# Patient Record
Sex: Male | Born: 1950
Health system: Southern US, Community
[De-identification: ages and names within clinical notes are randomized; demographics above are authoritative.]

## PROBLEM LIST (undated history)

## (undated) DIAGNOSIS — I219 Acute myocardial infarction, unspecified: Secondary | ICD-10-CM

## (undated) DIAGNOSIS — D124 Benign neoplasm of descending colon: Secondary | ICD-10-CM

## (undated) DIAGNOSIS — K635 Polyp of colon: Secondary | ICD-10-CM

## (undated) DIAGNOSIS — I209 Angina pectoris, unspecified: Secondary | ICD-10-CM

## (undated) DIAGNOSIS — E785 Hyperlipidemia, unspecified: Secondary | ICD-10-CM

## (undated) DIAGNOSIS — I10 Essential (primary) hypertension: Secondary | ICD-10-CM

## (undated) DIAGNOSIS — D509 Iron deficiency anemia, unspecified: Secondary | ICD-10-CM

## (undated) DIAGNOSIS — I2510 Atherosclerotic heart disease of native coronary artery without angina pectoris: Secondary | ICD-10-CM

## (undated) DIAGNOSIS — E119 Type 2 diabetes mellitus without complications: Secondary | ICD-10-CM

## (undated) DIAGNOSIS — K922 Gastrointestinal hemorrhage, unspecified: Secondary | ICD-10-CM

## (undated) DIAGNOSIS — N400 Enlarged prostate without lower urinary tract symptoms: Secondary | ICD-10-CM

## (undated) HISTORY — PX: CARDIAC CATHETERIZATION: SHX172

## (undated) HISTORY — DX: Hyperlipidemia, unspecified: E78.5

## (undated) HISTORY — DX: Acute myocardial infarction, unspecified: I21.9

## (undated) HISTORY — PX: HERNIA REPAIR: SHX51

## (undated) HISTORY — DX: Type 2 diabetes mellitus without complications: E11.9

## (undated) HISTORY — DX: Benign prostatic hyperplasia without lower urinary tract symptoms: N40.0

## (undated) HISTORY — DX: Benign neoplasm of descending colon: D12.4

## (undated) HISTORY — DX: Polyp of colon: K63.5

## (undated) HISTORY — PX: OTHER SURGICAL HISTORY: SHX169

---
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
## 2007-04-23 ENCOUNTER — Inpatient Hospital Stay (HOSPITAL_COMMUNITY): Admission: AD | Admit: 2007-04-23 | Discharge: 2007-04-25 | Payer: Self-pay | Admitting: Cardiology

## 2007-05-06 ENCOUNTER — Encounter: Admission: RE | Admit: 2007-05-06 | Discharge: 2007-05-06 | Payer: Self-pay | Admitting: Cardiology

## 2008-02-04 ENCOUNTER — Inpatient Hospital Stay (HOSPITAL_COMMUNITY): Admission: AD | Admit: 2008-02-04 | Discharge: 2008-02-05 | Payer: Self-pay | Admitting: Cardiology

## 2008-03-04 ENCOUNTER — Ambulatory Visit (HOSPITAL_COMMUNITY): Admission: RE | Admit: 2008-03-04 | Discharge: 2008-03-04 | Payer: Self-pay | Admitting: Cardiology

## 2008-04-28 ENCOUNTER — Inpatient Hospital Stay (HOSPITAL_COMMUNITY): Admission: EM | Admit: 2008-04-28 | Discharge: 2008-05-02 | Payer: Self-pay | Admitting: Emergency Medicine

## 2008-04-28 ENCOUNTER — Ambulatory Visit: Payer: Self-pay | Admitting: Hospitalist

## 2008-07-23 DIAGNOSIS — I10 Essential (primary) hypertension: Secondary | ICD-10-CM | POA: Insufficient documentation

## 2008-07-23 DIAGNOSIS — E785 Hyperlipidemia, unspecified: Secondary | ICD-10-CM | POA: Insufficient documentation

## 2008-07-23 DIAGNOSIS — K922 Gastrointestinal hemorrhage, unspecified: Secondary | ICD-10-CM

## 2008-07-23 HISTORY — DX: Gastrointestinal hemorrhage, unspecified: K92.2

## 2008-12-04 ENCOUNTER — Ambulatory Visit: Payer: Self-pay | Admitting: Family Medicine

## 2009-01-07 DIAGNOSIS — E1129 Type 2 diabetes mellitus with other diabetic kidney complication: Secondary | ICD-10-CM | POA: Insufficient documentation

## 2009-02-22 DIAGNOSIS — M5430 Sciatica, unspecified side: Secondary | ICD-10-CM | POA: Insufficient documentation

## 2009-02-24 ENCOUNTER — Emergency Department (HOSPITAL_COMMUNITY): Admission: EM | Admit: 2009-02-24 | Discharge: 2009-02-24 | Payer: Self-pay | Admitting: Emergency Medicine

## 2011-04-06 ENCOUNTER — Ambulatory Visit: Payer: Self-pay | Admitting: Family Medicine

## 2011-04-11 NOTE — Consult Note (Signed)
NAME:  CANNEN, DUPRAS NO.:  [id number]   MEDICAL RECORD NO.:  [id number]           PATIENT TYPE:   LOCATION:                                 FACILITY:   PHYSICIAN:  Aaronjames L. Randa Evens, M.D. DATE OF BIRTH:  12/03/1950   DATE OF CONSULTATION:  DATE OF DISCHARGE:                                 CONSULTATION   We were asked to see [name] today in consultation for hematemesis  by Dr. Dellia Beckwith of the Medical Teaching Service.   HISTORY OF PRESENT ILLNESS:  This is a 60 year old African American male  who had drug-eluting stent placement approximately 3 months ago.  He is  currently on 325 mg of aspirin and Plavix and he tells me that he has  been taking 2 Aleve tablets daily for the last 2 weeks due to his left  lower extremity pain.  He started complaining of shortness of breath for  the past 3 days.  He felt this is just due to his diet.  He awoke this  morning, had a dark stool and experienced a hot flash, diaphoresis and,  a presyncopal episode.  He came to the ER and had an episode of  hematemesis.  The emergency room doctor estimated the blood loss in the  hematemesis to be approximately 8 ounces.  The patient has never had  hematemesis or dark stools before.  He had a colonoscopy 5 years ago in  [place].  He was told to come back for repeat colonoscopy in 10  years.   PAST MEDICAL HISTORY:  Significant for coronary artery disease.  He is  status post MI and drug-eluting stent placement, type 2 diabetes,  dyslipidemia, GERD, and hypertension with left ventricular hypertrophy.  He has had 1 surgery to remove a fungal cyst that was near his left  clavicle.  He does not have a primary care physician.  His cardiologist  is Dr. Jacinto Halim.   ALLERGIES:  Drug allergies include PENICILLIN.   CURRENT MEDICATIONS:  Include 325 mg aspirin, Plavix, Glucotrol,  metoprolol, simvastatin, and one other that he did not remember the name  of, as well as 2 daily  Aleve.  He is not on PPI.   REVIEW OF SYSTEMS:  Significant for fever and diaphoresis this morning.  He reports he has lost approximately 8 pounds and has chronic pain in  his left lower extremity.   SOCIAL HISTORY:  Negative for tobacco and alcohol.   FAMILY HISTORY:  Negative for colon cancer, ulcer disease, and  gallbladder disease.   PHYSICAL EXAMINATION:  GENERAL:  He is alert and oriented, pleasant to  speak to.  VITAL SIGNS:  His temperature is 97.6, pulse 87, respirations 18, and  blood pressure is 105/72.  Heart:  His heart has a regular rate and rhythm with no murmurs, rubs,  or gallops.  LUNGS:  His lungs are clear to auscultation bilaterally.  ABDOMEN:  His abdomen is soft, nontender, and nondistended with good  bowel sounds.  EXTREMITIES:  He has no lower extremity edema.   LABORATORY DATA:  BMET is significant for  a BUN of 37, creatinine 0.9,  and glucose 208.  CBC shows a hemoglobin of 11.6 with an MCV value of  85, his white count is 15.3, platelets are 274,000, and hematocrit is  34.  PT is 13.9 and INR is 1.1.  Lipase is 28.  AST 28, ALT 18, alkaline  phosphatase 45, and total bilirubin 1.3.  Baseline hemoglobin from  looking at old e-chart records appears to be approximately 13.8.   ASSESSMENT:  Dr. Carman Ching has seen and examined the patient,  collected a history, and reviewed his chart.  His impression is that  this is a 60 year old male with likely an upper gastrointestinal bleed  secondary to nonsteroidal antiinflammatory drugs on top of his  antiplatelet medications.  We will perform upper endoscopy now.  He is  being admitted to the hospital by the Medical Teaching Service and  receiving IV rehydration and blood transfusions as needed.  Agree with  Protonix b.i.d.  We will follow with you.  Thanks very much for this  consultation.      Stephani Police, PA    ______________________________  Llana Aliment Randa Evens, M.D.    MLY/MEDQ  D:  04/28/2008   T:  04/29/2008  Job:  [id number]   cc:   Cristy Hilts. Jacinto Halim, MD

## 2011-04-11 NOTE — Discharge Summary (Signed)
NAME:  Craig York, GUESS NO.:  [id number]   MEDICAL RECORD NO.:  [id number]          PATIENT TYPE:  INP   LOCATION:  4735                         FACILITY:  [facility]   PHYSICIAN:  Eliseo Gum, M.D.   DATE OF BIRTH:  03-02-1951   DATE OF ADMISSION:  04/28/2008  DATE OF DISCHARGE:  05/02/2008                               DISCHARGE SUMMARY   DISCHARGE DIAGNOSES:  1. Upper gastrointestinal bleed from gastric ulcer, most likely due to      nonsteroidal antiinflammatory drugs.  2. Unstable angina, status post drug-eluting stent in left anterior      descending in 3/09  3. Diabetes.  4. Hypertension.  5. Hyperlipidemia.  6. Left ventricular hypertrophy.   DISCHARGE MEDICATIONS:  1. Protonix 40 mg by mouth once daily.  2. Plavix 75 mg by mouth once daily.  3. Zocor 20 mg by mouth once daily.  4. Metoprolol 12.5 mg by mouth twice daily.  5. Aspirin 81 mg by mouth to be started on May 07, 2008.  6. Metformin 500 mg by mouth twice daily.  7. Diovan 40 mg by mouth once daily.   DISPOSITION AND FOLLOWUP:  The patient will follow with Dr. Jacinto Halim,  [facility] Cardiology in 2 weeks' time.  The patient has elected to  make that appointment by himself.  The patient will also follow with Dr.  Randa Evens, [facility] in approximately 6 weeks.  Arrangements  have been made and the patient will need to call to confirm.  He was  noted to have a gastric ulcer.  He will need a repeat endoscopy in 6  weeks' time.  The patient will need to avoid all NSAIDs in the future.  There was no evidence of h pylori infection. He is also on Plavix.  Aspirin, which he was previously on, will be started 5 days from the day  of discharge.  He will need close monitoring of his hemoglobin as well  as followup on the GI bleed.   IMPORTANT DIAGNOSTIC STUDIES:  Upper GI endoscopy was done on April 28, 2008, which showed a gastric ulcer with visible vessel, 2 clips were  placed.  He will  need a followup endoscopy in 6 weeks to document  healing.   CONSULTATIONS:  1. Dalton L. Randa Evens, MD, gastroenterology.  2. Cristy Hilts. Jacinto Halim, MD, cardiology.   HISTORY OF PRESENT ILLNESS:  Craig York is a 60 year old male with a  history of coronary artery disease and unstable angina, who had a drug-  eluting stent placed in March 2009 to the LAD.  He presented with  dizziness and weakness associated with black-colored stools.  On the day  of admission, he had a syncopal episode and was also noted to vomit  bright red blood in the ED.  Over the last 2 weeks, he had been taking  Advil and Aleve for left knee pain.   ALLERGIES:  No known drug allergies.   PAST MEDICAL HISTORY:  1. Coronary artery disease, cardiac cath in February 04, 2008, and a      Taxus stent was placed to the LAD.  He also had 75% ostial lesion      as well as disease in the right coronary artery, left circumflex      and diagonal.  2. Hypertension.  3. Diabetes diagnosed in 2008.  4. Left ventricular hypertrophy.  5. GERD.  6. Hyperlipidemia with low LDL.   MEDICATIONS:  1. Zocor 40 mg daily.  2. Metformin 1000 mg b.i.d.  3. Glipizide 5 mg daily.  4. Aspirin 325 mg daily.  5. Diovan 80 mg daily.  6. Metoprolol 25 mg b.i.d.  7. Plavix 75 mg daily.  8. Nitroglycerin p.r.n..   SUBSTANCE HISTORY:  Former smoker, quit about 4 years ago.   PERTINENT PHYSICAL EXAMINATION:  VITAL SIGNS:  Temperature 97.6, blood  pressure 105/72, pulse 87, respiratory rate 18, and oxygen saturation  100% on room air.  GENERAL:  Not in acute distress, pleasant.  EYES:  Pupils are equal and reactive.  NECK:  Supple.  No JVD.  LUNGS:  Good air movement.  No reflection of pain on palpation.  CARDIOVASCULAR:  S1 and S2 present.  Regular rate and rhythm.  GI:  Soft, bowel sounds present, nontender, and nondistended.  EXTREMITIES:  No asymmetry, edema, warmth, or erythema.  No skin rash.  MUSCULOSKELETAL:  Normal.  NEURO:  Alert and  oriented x3.  Cranial nerves II-XII intact.  Motor and  sensory system intact.  Cerebellar signs intact.   ADMISSION LABS:  Hemoglobin 11.6, WBC 15.3, and platelets 274.  Sodium  141, potassium 4.1, chloride 112, bicarb 19, BUN 33, creatinine 1, and  glucose 2016.  Anion gap of 10, bilirubin of 1.3, alk phos was 45, SGOT  was 28, SGPT 18, protein 6.3, albumin 3.6, calcium 8.9, and lipase 20.  Chest x-ray, no active disease.  D-dimer was less than 0.22.  Cardiac  enzymes x3 negative.   HOSPITAL COURSE AND PROBLEMS:  1. Upper gastrointestinal bleed from a gastric ulcer.  The patient      presented with black, tarry stools and also had vomiting of bright      red blood.  He was immediately taken to the endoscopy suite.  An      endoscopy that was done showed a gastric ulcer with visible vessel.      Two clips were placed.  The gastrointestinal bleeding was thought      to be due to nonsteroidal antiinflammatory drugs.  A Campylobacter-      like organism test was done during this admission, which did not      show evidence of Helicobacter pylori.  Post-endoscopy, the      patient's hemoglobin dropped.  He was transfused 2 units of packed      RBCs.  His hemoglobin stabilized and at the time of discharge was      10.5.  His Plavix and aspirin were also held during this admission.      Plavix was restarted 2 days prior to discharge and the patient      tolerated that fairly well, and hemoglobin remained stable without      active gastrointestinal bleeding.  He will need a repeat upper      gastrointestinal endoscopy to follow up on the gastric ulcer in 6      weeks.  This will be arranged by Dr. Randa Evens' office.  At this      point, he has been started on Protonix 40 mg once daily, which he      will take continuously.  2. History  of unstable angina, status post drug-eluting stent to left      anterior descending.  The patient did not present with any chest      pain.  His cardiac enzymes  x3 was negative.  The EKG showed      pseudonormalization of the T-waves, which showed no ST elevation.      He remained chest pain-free throughout admission.  Aspirin and      Plavix were stopped at the time of admission.  Plavix was resumed 2      days prior to discharge.  There was a concern for re-stent      thrombosis because of the need to stop the Plavix.  The patient was      chest pain-free throughout admission.  He was also restarted on his      beta-blockers and Zocor.  He will start his aspirin 5 days from the      day of discharge.  He will also be started on an ACE inhibitor at      the time of discharge.  3. Diabetes.  The patient has recently been diagnosed with diabetes.      His hemoglobin A1c done during this admission was 5.5, indicating      good control.  He will be restarted on metformin at a lower dose      500 mg b.i.d.  4. Hyperlipidemia.  The patient will continue his Zocor.   DISCHARGE VITAL SIGNS:  Temperature 97.5, blood pressure 119/82, pulse  63, and oxygen saturation 94%.   Blood sugars 112, 109, 111, and 107.  His hemoglobin is 10.5, white  count 6.4, and platelets 168.  Sodium 142, potassium 3.7, chloride 112,  bicarb 27, BUN 9, creatinine 0.97, and glucose of 101.      Ronda Fairly, M.D.  Electronically Signed      Eliseo Gum, M.D.  Electronically Signed    YB/MEDQ  D:  05/02/2008  T:  05/03/2008  Job:  [id number]   cc:   Cristy Hilts. Jacinto Halim, MD  Llana Aliment Malon Kindle., M.D.

## 2011-04-11 NOTE — Discharge Summary (Signed)
NAME:  RUTH, TULLY NO.:  [id number]   MEDICAL RECORD NO.:  [id number]          PATIENT TYPE:  INP   LOCATION:  2917                         FACILITY:  [facility]   PHYSICIAN:  Lezlie Octave, N.P.     DATE OF BIRTH:  1951-04-21   DATE OF ADMISSION:  02/04/2008  DATE OF DISCHARGE:  02/05/2008                               DISCHARGE SUMMARY   Mr. Jaheim Canino is a 60 year old African American male patient who  has previously been seen in the office by Dr. Elsie Lincoln.  He has had no  known coronary disease in the past.  He actually had undergone cardiac  catheterization at [facility] in 2004 with normal coronary arteries.  About at year ago he was diagnosed with diabetes and has been on  medicines since.  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  Thus he was seen in the office.  His EKG had changed.  He had T-wave inversions anterolateral and it was  decided that he should be admitted for further evaluation and cardiac  catheterization.  He was also coughing greenish-yellow expectoration,  had been sick for about a week.  Thus he was put on IV antibiotics.  He  was seen by Dr. Jacinto Halim in the office and Dr. [name] in the hospital.  It was decided that he should undergo cardiac catheterization that day  and this was performed by Dr. [name].  He had a tight lesion in his  LAD and diagonal.  He had multi-vessel disease 14 and 15 in the RCA, 30  and 40 in the left circumflex and the diagonal 2 had a 70% lesion.  He  then underwent procedure by Dr. Nanetta Batty.  He had a TAXUS Liberty  stent placed, 2.75 x 24 to his LAD, reduced from 95% to zero.  He  remained with a diagonal 75% ostial and was decided that he should be  treated medically for this.  Dr. Allyson Sabal felt that he could be discharged  home the following day.  The following day he was seen by Dr. Elsie Lincoln,  who considered him stable for discharge home.  His pressure  was 134/83,  heart rate of 73, respirations were 17.   LABS:  His hemoglobin was 11.9, hematocrit 35.2, platelets 297, WBC 9.9.  His sodium was 140, potassium 3.8, chloride 105, CO2 26, BUN 10,  creatinine 1.03, glucose was 103.  PT INR and PTT were normal.  His AST  was 21, ALT was 28.  His cardiac enzymes:  1. 220/3.1.  2. 168/2.4.  3. 154/3.   Troponin #1 was 0.01, #2 was 0.03 and #3 was 0.24 after his procedure.  Total cholesterol is 122, LDL 77, HDL was 22, triglycerides were 114.  He did not have a chest x-ray in the hospital.   DISCHARGE MEDICATIONS:  Zocor 40 mg at bedtime, Glucophage 100 mg twice  per day.  He should not take this until Friday morning.  Glipizide 5 mg  every a.m., Mucinex 1200 mg twice a day while he has congestion,  then he  can stop it.  Aspirin 325 mg a day x2 weeks, then to do two 81 mg  aspirin a day, Diovan 80 mg a day, metoprolol 25 mg twice per day,  Plavix 75 mg a day.  He should now stop Zithromax 500 mg times 1 day,  then 250 every day x4 days.  Nitroglycerin 150 under tongue every 5  minutes x3 for chest pain.   He will follow up with Dr. Jacinto Halim on February 25, 2008 at 2 p.m. per patient  preference and he should do no strenuous lifting, pushing, pulling or  extended walking for 2 weeks.  He will not go to work for 2 weeks.   DISCHARGE DIAGNOSES:  1. Unstable angina.  2. Status post cardiac catheterization now with coronary artery      disease with subsequent TAXUS Liberty stent to his proximal LAD for      high-grade stenosis 95%.  He does have residual disease in his      diagonal 3 of 70%, diagonal 2 of 70% proximal third, distal left      anterior descending 80%, circumflex 30% and 40%, and right coronary      artery 40% __________% and 50%, multiple lesions.  3. Normal ejection fraction of greater than 60%.  4. Hypertensive heart disease with left ventricular hypertrophy by      cath.  5. Gastroesophageal reflux disease.  6.  Hypertension.  7. Hyperlipidemia with low HDL.  Will need to be started on Niaspan in      the future.  8. Upper respiratory infection.  Placed on antibiotic and Mucinex to      be continued as an outpatient.      Lezlie Octave, N.P.     BB/MEDQ  D:  02/05/2008  T:  02/06/2008  Job:  [id number]

## 2011-04-11 NOTE — Cardiovascular Report (Signed)
NAME:  Craig York, Craig York NO.:  [id number]   MEDICAL RECORD NO.:  [id number]          PATIENT TYPE:  INP   LOCATION:  2023                         FACILITY:  [facility]   PHYSICIAN:  Nanetta Batty, M.D.   DATE OF BIRTH:  08-16-1951   DATE OF PROCEDURE:  02/04/2008  DATE OF DISCHARGE:                            CARDIAC CATHETERIZATION   HISTORY:  Craig York is a 60 year old African American male with  increasing angina seen by Dr. Jacinto Halim today in the office and transferred  to [facility] for cardiac cath which was performed by Dr. Pearletha Furl.  [name] revealing a 95% segmental proximal LAD stenosis after the  third diagonal branch which was a moderate size vessel and had 70%  ostial stenosis.  He presents now for percutaneous intervention.   DESCRIPTION OF PROCEDURE:  The patient was in the second floor [facility] cardiac cath lab with a 6 French sheath in the right femoral  artery.  He had been on aspirin and received 600 mg of p.o. Plavix as  well as Angiomax bolus with an ACT of 300.  Visipaque dye was used for  the entirety of the case.  Retrograde pressures were monitored during  the case.  A 6 French sheath was exchanged over wire for a 7 [place]  sheath.  Using a 7 [place] XP 3.5 guide along with an 0.014 Asahi soft  wire and a 2.0 15 Maverick PTCA was performed of the LAD just after the  diagonal branch, resulting in a dissection.  Following this a 275 24  Taxus Liberte stent was then carefully positioned fluoroscopically and  angiographically and deployed at 12 atmospheres (3 mm) resulting in  reduction of 95-99% lesion with dissection to zero percent residual with  excellent flow.  Attempts were made to dilate the diagonal branch within  this stent strut however a 2012 Voyager was never able to pass the  proximal stent and thus this was left alone.   IMPRESSION:  Successful PCI (percutaneous coronary intervention)  stenting of proximal LAD (left anterior  descending) using a Taxus drug  eluting stent and Angiomax anticoagulation with aspirin and Plavix as  well.  The patient tolerated the procedure well.  There were no  hemodynamic electrocardiographic sequelae.  The guidewire and catheter  were removed.  Sheath was sewn securely in place.  The patient left the  lab in stable condition.      Nanetta Batty, M.D.  Electronically Signed     JB/MEDQ  D:  02/04/2008  T:  02/05/2008  Job:  [id number]   cc:   Cristy Hilts. Jacinto Halim, MD  Richard A. [name], M.D.  Merlene Laughter. Renae Gloss, M.D.

## 2011-04-11 NOTE — Discharge Summary (Signed)
NAME:  Craig York, Craig York NO.:  [id number]   MEDICAL RECORD NO.:  [id number]          PATIENT TYPE:  INP   LOCATION:  3711                         FACILITY:  [facility]   PHYSICIAN:  Madaline Savage, M.D.DATE OF BIRTH:  1951-08-15   DATE OF ADMISSION:  04/23/2007  DATE OF DISCHARGE:  04/25/2007                               DISCHARGE SUMMARY   HISTORY OF PRESENT ILLNESS:  Mr. Frommelt is a 60 year old African-  American who was first seen in our office on the day of admission.  He  apparently was at work.  He got presyncopal.  He had been having trouble  lately with dizziness and lightheadedness every other day, frequent  urination, 15 to 20-pound weight loss.  He was seen by Dr. Elsie Lincoln, it  was decided to admit him for further evaluation.  It was suspected that  he might have new onset of diabetes.  He came to the hospital.  His labs  revealed that his blood sugar in the 500 range.  [facility] was called  for further workup and treatment of his diabetes.  They gave him IV  fluids, they gave him insulin subcu, tracked a hemoglobin Alc, and  started him on oral hypoglycemics.  The following day his blood sugar  was down to 129, his potassium was at 3.3, he was given potassium  replacement, he was started on Lantus insulin subcu.  His chest x-ray  showed an abnormality, questionable paralyzed right hemidiaphragm.  Radiologist suggested doing a CT scan of his chest, which was done,  which showed some no mediastinal mass.  It had some mild esophageal  thickening, questionable dysmobility.  It showed no lung nodules in the  right lung.  Recommended consideration of another CT scan in  approximately a year.  He was seen by Dr. Elsie Lincoln on Apr 25, 2007,  considered stable to be discharged home.  If after he ambulated he was  stable, Dr. Elsie Lincoln felt that in the future he may need a outpatient  Persantine Myoview.  He does have a history of coronary artery disease,  supposedly had  a cath at [facility] in the past.  He will also need to obtain  a primary care doctor.  At the time of discharge, the patient stated  that he has a doctor that he sees at [facility], it is a resident  at [facility], the patient did not know his name.   LABS:  Sodium 137, potassium 4.2, BUN 8, creatinine 0.89, glucose 169,  initially it was 579.  His hemoglobin Alc was 13.  His hemoglobin is  13.8, hematocrit 40.2, WBC 6.2, and platelets 194.  Total cholesterol  was 206, LDL was 144, HDL was 33, triglycerides was 145.  AST was 26,  ALT was 35, and his TSH was 1.731.   DISCHARGE MEDICATIONS:  1. Aspirin 325 mg a day.  2. Lantus insulin 10 units subcutaneous injection at bedtime.  3. Glucotrol 5 mg every morning.  4. Simvastatin 40 mg at bedtime.   He has a scheduled Persantine Myoview on June 3 at 8:15 at Dr.  Gamble's  office.  He has a followup with Dr. Elsie Lincoln on June 13 at 2 p.m.  He  should follow up with his primary care MD next week about his diabetes.  He should check his blood sugar by his meter at least twice per day  before a meal and at bedtime.  He should write them down and bring them  to his primary care MD.  While he was here in the hospital, he did  receive diabetes education.  He also received education how to give  himself a insulin injection.  He is referred to outpatient diabetes  education.   DISCHARGE DIAGNOSES:  1. New onset of diabetes.  2. Presyncope, probably related to his diabetes mellitus with a blood      sugar of 579 on admission, recent weight loss, fatigue, frequent      urination at time.  3. Prior history of arteriosclerotic cardiovascular disease,      supposedly catheterization at [facility], we do not have records      for that.  4. Dyslipidemia.      Lezlie Octave, N.P.    ______________________________  Madaline Savage, M.D.    BB/MEDQ  D:  04/25/2007  T:  04/26/2007  Job:  [id number]   cc:   [facility]

## 2011-04-11 NOTE — Cardiovascular Report (Signed)
NAME:  Craig York, QUAM NO.:  [id number]   MEDICAL RECORD NO.:  [id number]          PATIENT TYPE:  OIB   LOCATION:  2852                         FACILITY:  [facility]   PHYSICIAN:  Cristy Hilts. Jacinto Halim, MD       DATE OF BIRTH:  [birth year]   DATE OF PROCEDURE:  DATE OF DISCHARGE:  03/04/2008                            CARDIAC CATHETERIZATION   PROCEDURES PERFORMED:  Left heart catheterization including:  1. Left ventriculography.  2. Selective right and left coronary arteriography.  3. Right femoral arteriography and closure of the right femoral      arterial access with StarClose.   INDICATIONS:  Craig York is a 60 year old gentleman with known  coronary disease and had undergone PTCA and stenting to his LAD on February 04, 2008.  At that time, he had a stent-jailed diagonal 2, which was  felt to be small and was left alone.  He continued to have exertional  chest discomfort.  Given this, I suspected that there could be  mechanical complications from angioplasty and he was brought to the  cardiac catheterization lab to evaluate his coronary anatomy.  He also  has history of hypertension, diabetes, and hyperlipidemia.   HEMODYNAMIC DATA:  His left ventricular pressure was 141/40, with end-  diastolic pressure of 11 mmHg.  Aortic pressure was 144/85 with a mean  of 111 mmHg.  There was no pressure gradient across the aortic valve.   Right coronary artery was a large dominant vessel.  He has got mild  luminary irregularity.  There was a large PDA and moderate-sized PLA.   Left main:  Left main is a large caliber vessel, it appeared smooth and  normal.   Circumflex coronary artery:  Circumflex coronary artery is a large  caliber vessel, it gives origin to a large AV groove branch and a AV  nodal branch.  Also, origin to the distal right circumflex is smooth and  normal.  Otherwise, the distal right circumflex coronary artery is  smooth and normal.   Ramus Intermedius:   Ramus intermedius is a small to moderate caliber  vessel, it is smooth and normal.   LAD:  LAD is a large caliber vessel, gives origin to a very small  diagonal 1, a moderate-sized diagonal 2, which measures approximately 2  mm in diameter at most.  However, it supplies large part of the lateral  walls.  The previously placed 2.74 x 24 mm Taxus stent on February 04, 2008  is widely patent and the stent-jailed diagonal has an ostial 80%  stenosis.  However, there was brisk flow noted in this diagonal.  The  mid to distal LAD itself has mild luminal irregularity.   IMPRESSION:  Chest pain is secondary to stent-jailed diagonal tube,  which is a moderate caliber vessel.  Previously an attempt was made to  angioplasty this.  This was abandoned because of inability to cross  this.  We would recommend medical therapy only as this artery has brisk  flow.  It may give some chronic angina but could easily be treated with  medical therapy.  There is no compromise or dissection noted in this  diagonal branch.  The previously placed LAD stent, i.e., 2.74 x 24  Taxus, on February 04, 2008, is widely patent.  He will need aggressive  continued risk modification.   A total of 80 mL of contrast was utilized for diagnostic angiography.   TECHNICAL PROCEDURE:  Under usual sterile precaution, using a 6-French  right femoral arterial access, a 6-French JL4 diagnostic catheter was  utilized to engage left main coronary artery in the usual fashion over a  J-wire and angiography was performed.  The catheter was then pulled out  of the body over a J-wire and a 6-French JR4 diagnostic catheter was  utilized to advance across the aortic valve over a J-wire and advanced  into the left ventricle.  Left ventricular pressures were evaluated and  left ventriculography was performed both in LAO and RAO projection with  hand contrast injection.  The catheter was then pulled into the  ascending aorta.  Right coronary  selective angiography was performed.  Then the catheter was pulled out of the body.  Right femoral  arteriography was performed through the arterial access sheath and the  access closed with StarClose with excellent hemostasis.  The patient  tolerated the procedure well.  No immediate complications were  noted.      Cristy Hilts. Jacinto Halim, MD  Electronically Signed     Cristy Hilts. Jacinto Halim, MD  Electronically Signed    JRG/MEDQ  D:  03/04/2008  T:  03/05/2008  Job:  [id number]

## 2011-04-11 NOTE — Cardiovascular Report (Signed)
NAME:  Craig York, Craig York NO.:  [id number]   MEDICAL RECORD NO.:  [id number]          PATIENT TYPE:  INP   LOCATION:  2023                         FACILITY:  [facility]   PHYSICIAN:  Richard A. [name], M.D.DATE OF BIRTH:  [birth year]   DATE OF PROCEDURE:  02/04/2008  DATE OF DISCHARGE:                            CARDIAC CATHETERIZATION   PROCEDURE:  Retrograde central aortic catheterization, selective  coronary angiography via Judkins technique, pre and post IC  nitroglycerin administration, LV angiogram RAO/LAO projection,  subselective LIMA, abdominal aortic angiogram midstream PA projection  hand injection, right femoral angiogram oblique projection hand  injection.   PROCEDURE:  The patient was brought to the second floor CP lab in  postabsorptive state.  He was on IV nitroglycerin and had received  heparin on the floor for a syndrome of unstable angina with an EKG  changes compatible with lateral ischemia, T-wave inversion, and/or LVH  with secondary T changes.  He was pain free.  Informed consent was  obtained to proceed with the procedure from the patient and his family.   The right groin was prepped, draped in the usual manner.  One percent  Xylocaine was used for local anesthesia, and the right CFA was entered  with single anterior puncture using an 18 thin-wall needle.  A 6-French  sidearm sheath was inserted without difficulty.  Diagnostic coronary  angiography was done with 6-French 4-cm taper preformed Cordis left and  right coronary catheters.  IC nitroglycerin 0.2 mg x2 was given into the  left coronary artery with repeat injections obtained.  Subselective LIMA  was done in the PA projection using the right coronary catheter and hand  injections demonstrated widely patent left subclavian, normal antegrade  left vertebral, and large normal LIMA.  LV angiogram was done in the RAO  and LAO projection at 25 mL 14 mL per second RAO and hand injection of  the LAO projection.  Pullback pressure of the CA showed no gradient  across the aortic valve.  Abdominal aortic angiogram was done by hand  injection above the level of the renal arteries because of the patient's  hypertension, and this demonstrated single normal widely patent renal  arteries.  No significant immediate infrarenal abdominal aortic disease.  Catheter was removed and oblique injection of the right groin  demonstrated a puncture above the level of the profunda, but the  profunda arose high just below the level of sheath insertion.  The  vessel appeared quite good with no plaques or stenosis.  Catheters  removed.  Side-arm sheath was flushed.  The patient tolerated the  procedure well.   PRESSURES:  LV:  160/0; LVEDP 16 mmHg.   CA:  60/80 - 85 mmHg.   There was no gradient across the aortic valve on catheter pullback.   Fluoroscopy did not reveal any significant coronary, intracoronary, or  valvular calcification.   LV angiogram demonstrated vigorously contracting left ventricle with  mild-to-moderate concentric LVH and EF greater than 60%.  No significant  wall motion abnormalities are present.  No mitral regurgitation.   The main left coronary artery was large, smooth,  and normal.   The circumflex artery was a large vessel.  It demonstrated smooth 40%  narrowing in the large mid circumflex OM branch which was the  predominant vessel in the circumflex system.  This trifurcated distally  and had no significant distal stenosis.  There was a PAVG branch that  trifurcated distally that was normal and a left atrial branch that was  normal.  There were irregularities in the proximal third but no  significant stenosis.   The left anterior descending gave off a small first bifurcating diagonal  branch just after its origin that had no significant stenosis followed  by a large bifurcating septal perforator.   The second diagonal branch was a thin, relatively, small branch  that had  70% narrowing segmentally in its proximal third and bifurcated.   There was a high-grade bifurcation LAD large DX3 lesion.  This was  [place] classification 1-1-1.  Proximal to the DX3, there was 60%  narrowing just beyond the DX3 segmentally was 95% narrowing.  The side  branch DX3 had 72% proximal mildly segmental narrowing.  The DX3 was  moderately large and bifurcated.  The LAD beyond the high-grade stenosis  showed a small fourth DX4 from the midportion of the lesion, the  junction of the proximal mid third of the LAD.  There was irregularity  throughout the LAD with 40% narrowing in the midportion and an 80%  smooth narrowing before the apical bifurcation.  There was TIMI 3 flow.  No evidence of thrombus formation.  There was no significant change with  nitroglycerin administration.   The right coronary was a dominant vessel.  There was 40% smooth  narrowing.  It is concentric in the proximal third at the bend.  Another  40-50% smooth narrowing at the acute margin.  A 50% concentric narrowing  in the distal RCA.  There was a very large PDA x2 and bifurcating PLA  that had no significant stenosis.   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  He quit smoking over 40 years ago.  He underwent angiography  in [facility] for chest pain, January 2004, and report available shows  that the patient had normal coronary arteries and LV at that time.  He  has been treated for hypertension but not recently on ACE inhibitors,  but he has been on chronic aspirin therapy.  Over the last two to three  weeks, he has had progressive worsening of fairly typical exertional  chest pressure.  His episodes have lasted usually 2-3 minutes, and this  morning, he had a 5-minute episode getting out of his car going to work  at [employer].  He has also had a cough productive of yellow-greenish   sputum and coughing, and he has had some chest discomfort with coughing,  and it is difficult to tell if this is similar or different from his  fairly typical exertional angina.  He was seen by the nurse at [facility], referred to office, seen promptly by Dr. Jacinto Halim, and he was  admitted from the office and started on IV nitroglycerin and beta  blockers.   He does have a history of hyperlipidemia on statin therapy, and he has  had fairly recent diagnosis of diabetes over the last two years on oral  medication under the care of Dr. Kellie Shropshire.   Angiography reveals high-grade proximal third LAD - DX3 bifurcation  stenosis [place] classification 1-1-1.  There was distal smooth LAD  disease and irregularity beyond this but a good vessel beyond the  stenosis.  There is noncritical dominant right coronary disease and  normal LV function with hypertension.   I would recommend consideration for PCI in this setting.  He could  either have LAD stenting and cross the DX3 and side branch intervention  as necessary or some type of bifurcation procedure.  Cine angiograms  will be reviewed with my colleague, Dr. Allyson Sabal, and the patient will be  medicated appropriately pending PCI.   CATHETERIZATION DIAGNOSES:  1. Acute coronary syndrome with accelerated angina x3 weeks,      exertional.  2. Negative cardiac biomarkers acutely on admission.  3. High-grade bifurcation proximal third LAD - DX3 bifurcation disease      as outlined above, noncritical right coronary disease, normal LV      function.  4. Adult-onset diabetes mellitus.  5. Systemic hypertension.  6. Hyperlipidemia.  7. Remote cigarette abuse.      Richard A. [name], M.D.  Electronically Signed     RAW/MEDQ  D:  02/04/2008  T:  02/05/2008  Job:  [id number]   cc:   Madaline Savage, M.D.  Merlene Laughter. Renae Gloss, M.D.  Cristy Hilts. Jacinto Halim, MD  Nanetta Batty, M.D.  Richard A. [name], M.D.

## 2011-04-11 NOTE — Consult Note (Signed)
NAME:  Craig York, Craig York NO.:  [id number]   MEDICAL RECORD NO.:  [id number]          PATIENT TYPE:  INP   LOCATION:  3711                         FACILITY:  [facility]   PHYSICIAN:  Wilson Singer, M.D.DATE OF BIRTH:  03-04-1951   DATE OF CONSULTATION:  04/23/2007  DATE OF DISCHARGE:                                 CONSULTATION   REFERRING PHYSICIAN:  Dr. Chanda Busing.   HISTORY:  This 60 year old man was admitted by Dr. Elsie Lincoln for pre-  syncope.  He gives a few weeks history of weight loss of 21 pounds,  polydipsia, and polyuria.  He now presented to Dr. Elsie Lincoln with light-  headedness from his work and pre-syncope.   PAST MEDICAL HISTORY:  Possible myocardial infarction in [place].  No other serious illnesses.   SOCIAL HISTORY:  He works at [employer].  He appears to be a non-smoker.  No history of alcohol abuse.   MEDICATIONS:  He says he is on some blood pressure medicines, but he  does not know the name.   ALLERGIES:  NONE KNOWN.   FAMILY HISTORY:  There is no family history of diabetes.   REVIEW OF SYSTEMS:  The problem symptoms are mentioned above.  There are  no other symptoms in all systems reviewed.   PHYSICAL EXAMINATION:  VITAL SIGNS:  Temperature 97.3, blood pressure  117/77, pulse 83, saturation 99% on room air.  CARDIOVASCULAR EXAMINATION:  Heart sounds are present with a 2/6  systolic murmur.  There is no gallop rhythm.  RESPIRATORY:  Lung fields are clear.  ABDOMEN:  Soft, nontender with no hepatosplenomegaly.  NEUROLOGICAL:  Alert and orientated with no focal neurologic signs.   INVESTIGATIONS:  Sodium 131, potassium 4.9, chloride 95, bicarbonate 25,  glucose 579.  Hemoglobin 15.6, white blood cell count 6, platelets  219,000.   IMPRESSION:  1. Newly diagnosed uncontrolled type 2 diabetes mellitus.  2. Hypovolemia.   PLAN:  1. Intravenous fluids.  2. Intravenous insulin to get rapid control.  3. Check hemoglobin A1c and  diabetic teaching.  4. Start oral hypoglycemic agents.   We will follow this patient with you as I think the primary cause of the  patient's admission is his diabetes.      Wilson Singer, M.D.  Electronically Signed     NCG/MEDQ  D:  04/23/2007  T:  04/24/2007  Job:  [id number]   cc:   Madaline Savage, M.D.

## 2011-04-11 NOTE — Op Note (Signed)
NAME:  Craig York, Craig York NO.:  [id number]   MEDICAL RECORD NO.:  [id number]          PATIENT TYPE:  INP   LOCATION:  4735                         FACILITY:  [facility]   PHYSICIAN:  Cedric L. Malon Kindle., M.D.DATE OF BIRTH:  1950-12-11   DATE OF PROCEDURE:  04/28/2008  DATE OF DISCHARGE:                               OPERATIVE REPORT   PROCEDURE:  Esophagogastroduodenoscopy with control of bleeding.   MEDICATIONS:  Cetacaine spray, fentanyl 100 mcg, and Versed 8 mg IV.   INDICATIONS:  A 60 year old with a recent drug-eluting stent placement  who has been taking Aleve for arthritis pains, came in feeling weak with  melenic stools and hematemesis in the ER.   DESCRIPTION OF PROCEDURE:  The procedure was explained to the patient,  consent obtained.  In the left lateral decubitus position, the Pentax  upper scope was inserted and advanced.  The stomach was entered.  There  were a large amount of clots and blood in the stomach.  We snaked along  the lesser curve into the duodenum.  The duodenum including the bulb and  second portion was completely normal.  We came back into the stomach and  vigorously irrigated, and then a 2-cm ulcer with a visible vessel that  was not actively bleeding at the time that we saw it with an adherent  clot was seen along the incisura.  Two clips were placed on this.  A  biopsy away from this was obtained for CLO test for Helicobacter.  We  did wash the remainder of the stomach, could find no other bleeding  sites.  There were some clots obscuring the greater curve toward the  fundus.  The scope was withdrawn.  There were no signs of gastric or  esophageal varices.  The patient tolerated the procedure well and was  resting comfortably at the termination of procedure.  There were no  immediate complications.   ASSESSMENT:  1. Gastric ulcer with visible vessel, almost certainly due to      nonsteroidal antiinflammatory drugs, 2 clips placed.  2.  Hold aspirin and Plavix for a couple of days with progressive      proton pump inhibitor therapy.  3. We will repeat endoscopy in 6 weeks to document healing.           ______________________________  Llana Aliment. Malon Kindle., M.D.     Waldron Session  D:  04/28/2008  T:  04/29/2008  Job:  [id number]   cc:   Cristy Hilts. Jacinto Halim, MD  Leighton Roach McDiarmid, M.D.

## 2011-06-16 ENCOUNTER — Ambulatory Visit: Payer: Self-pay | Admitting: Internal Medicine

## 2011-08-21 LAB — BASIC METABOLIC PANEL
GFR calc non Af Amer: >60
Glucose, Bld: 103 — ABNORMAL HIGH
Potassium: 3.8
Sodium: 140

## 2011-08-21 LAB — CBC
HCT: 35.2 — ABNORMAL LOW
HCT: 40.4
Hemoglobin: 11.9 — ABNORMAL LOW
Hemoglobin: 13.5
MCHC: 33.4
MCV: 84.4
RBC: 4.79
WBC: 9.9

## 2011-08-21 LAB — CK TOTAL AND CKMB (NOT AT ARMC): Relative Index: 1.9

## 2011-08-21 LAB — COMPREHENSIVE METABOLIC PANEL
ALT: 28
BUN: 9
CO2: 27
Calcium: 9.4
Creatinine, Ser: 0.86
GFR calc non Af Amer: >60
Glucose, Bld: 77
Sodium: 139

## 2011-08-21 LAB — DIFFERENTIAL
Eosinophils Absolute: 0.1
Lymphs Abs: 2.2
Neutro Abs: 7.8 — ABNORMAL HIGH
Neutrophils Relative %: 73

## 2011-08-21 LAB — B-NATRIURETIC PEPTIDE (CONVERTED LAB): Pro B Natriuretic peptide (BNP): <30

## 2011-08-21 LAB — LIPID PANEL
Cholesterol: 122
LDL Cholesterol: 77
Triglycerides: 114

## 2011-08-21 LAB — CARDIAC PANEL(CRET KIN+CKTOT+MB+TROPI)
CK, MB: 2.4
Relative Index: 1.4

## 2011-08-21 LAB — TROPONIN I: Troponin I: 0.24 — ABNORMAL HIGH

## 2011-08-21 LAB — MAGNESIUM: Magnesium: 2.1

## 2011-08-21 LAB — PROTIME-INR: Prothrombin Time: 12.8

## 2011-08-24 LAB — DIFFERENTIAL
Basophils Absolute: 0.1
Lymphocytes Relative: 22
Monocytes Absolute: 0.6
Neutro Abs: 11.3 — ABNORMAL HIGH

## 2011-08-24 LAB — CBC
HCT: 24.2 — ABNORMAL LOW
HCT: 26.2 — ABNORMAL LOW
HCT: 28.1 — ABNORMAL LOW
HCT: 28.2 — ABNORMAL LOW
HCT: 28.2 — ABNORMAL LOW
HCT: 28.4 — ABNORMAL LOW
HCT: 30 — ABNORMAL LOW
HCT: 34.2 — ABNORMAL LOW
Hemoglobin: 10 — ABNORMAL LOW
Hemoglobin: 10.5 — ABNORMAL LOW
Hemoglobin: 10.5 — ABNORMAL LOW
Hemoglobin: 11.1 — ABNORMAL LOW
Hemoglobin: 8.2 — ABNORMAL LOW
Hemoglobin: 9 — ABNORMAL LOW
Hemoglobin: 9.5 — ABNORMAL LOW
Hemoglobin: 9.6 — ABNORMAL LOW
Hemoglobin: 9.7 — ABNORMAL LOW
Hemoglobin: 9.9 — ABNORMAL LOW
MCHC: 33.8
MCHC: 33.9
MCHC: 34
MCHC: 34.1
MCHC: 34.4
MCHC: 34.5
MCHC: 34.6
MCHC: 34.8
MCHC: 35
MCHC: 35
MCV: 84.9
MCV: 85.3
MCV: 85.9
MCV: 86
MCV: 86.3
MCV: 86.3
MCV: 86.6
MCV: 86.9
MCV: 87
MCV: 87.4
MCV: 90
Platelets: 120 — ABNORMAL LOW
Platelets: 134 — ABNORMAL LOW
Platelets: 141 — ABNORMAL LOW
Platelets: 144 — ABNORMAL LOW
Platelets: 146 — ABNORMAL LOW
Platelets: 159
Platelets: 222
Platelets: 274
RBC: 2.81 — ABNORMAL LOW
RBC: 3.03 — ABNORMAL LOW
RBC: 3.12 — ABNORMAL LOW
RBC: 3.23 — ABNORMAL LOW
RBC: 3.26 — ABNORMAL LOW
RBC: 3.28 — ABNORMAL LOW
RBC: 3.48 — ABNORMAL LOW
RBC: 3.54 — ABNORMAL LOW
RBC: 3.74 — ABNORMAL LOW
RDW: 13.9
RDW: 14.1
RDW: 14.3
RDW: 14.4
RDW: 14.5
RDW: 14.6
RDW: 14.7
RDW: 14.8
RDW: 14.8
RDW: 17.8 — ABNORMAL HIGH
WBC: 10.9 — ABNORMAL HIGH
WBC: 5.3
WBC: 5.8
WBC: 6
WBC: 6.1
WBC: 7.4
WBC: 9.6
WBC: 9.9

## 2011-08-24 LAB — COMPREHENSIVE METABOLIC PANEL
Albumin: 3.6
BUN: 33 — ABNORMAL HIGH
Chloride: 112
Creatinine, Ser: 0.99
Total Bilirubin: 1.3 — ABNORMAL HIGH
Total Protein: 6.3

## 2011-08-24 LAB — BASIC METABOLIC PANEL
CO2: 25
Calcium: 8.5
Chloride: 109
Creatinine, Ser: 1.02
Glucose, Bld: 90

## 2011-08-24 LAB — PROTIME-INR
INR: 1.1
Prothrombin Time: 13.9

## 2011-08-24 LAB — BASIC METABOLIC PANEL WITH GFR
BUN: 19
BUN: 9
BUN: 9
CO2: 25
CO2: 25
CO2: 27
Calcium: 8 — ABNORMAL LOW
Calcium: 8.3 — ABNORMAL LOW
Calcium: 8.7
Chloride: 109
Chloride: 111
Chloride: 112
Creatinine, Ser: 0.87
Creatinine, Ser: 0.95
Creatinine, Ser: 0.97
GFR calc non Af Amer: >60
GFR calc non Af Amer: >60
GFR calc non Af Amer: >60
Glucose, Bld: 101 — ABNORMAL HIGH
Glucose, Bld: 125 — ABNORMAL HIGH
Glucose, Bld: 94
Potassium: 3.4 — ABNORMAL LOW
Potassium: 3.5
Potassium: 3.7
Sodium: 139
Sodium: 140
Sodium: 142

## 2011-08-24 LAB — CROSSMATCH
ABO/RH(D): A POS
Antibody Screen: NEGATIVE

## 2011-08-24 LAB — POCT I-STAT, CHEM 8
BUN: 37 — ABNORMAL HIGH
Calcium, Ion: 1.05 — ABNORMAL LOW
Chloride: 111
Creatinine, Ser: 0.9
Glucose, Bld: 208 — ABNORMAL HIGH
HCT: 34 — ABNORMAL LOW
Hemoglobin: 11.6 — ABNORMAL LOW
Potassium: 3.8
Sodium: 140
TCO2: 15

## 2011-08-24 LAB — CARDIAC PANEL(CRET KIN+CKTOT+MB+TROPI)
CK, MB: 2.5
CK, MB: 2.6
CK, MB: 2.8
Relative Index: 1.9
Relative Index: 2
Relative Index: 2
Total CK: 134
Total CK: 141
Troponin I: <0.01

## 2011-08-24 LAB — HEMOGLOBIN A1C
Hgb A1c MFr Bld: 5.5
Mean Plasma Glucose: 119

## 2011-08-24 LAB — POCT CARDIAC MARKERS
CKMB, poc: 1.8
Myoglobin, poc: 32.3
Operator id: 294521
Troponin i, poc: <0.05

## 2011-08-24 LAB — CK TOTAL AND CKMB (NOT AT ARMC)
CK, MB: 2.7
Relative Index: 1.8
Total CK: 152

## 2011-08-24 LAB — MAGNESIUM: Magnesium: 2.1

## 2011-08-24 LAB — ABO/RH: ABO/RH(D): A POS

## 2011-08-24 LAB — LIPASE, BLOOD: Lipase: 20

## 2011-08-24 LAB — D-DIMER, QUANTITATIVE

## 2011-08-24 LAB — CLOTEST (H. PYLORI), BIOPSY: Helicobacter screen: NEGATIVE

## 2012-02-22 LAB — HM COLONOSCOPY

## 2012-06-03 ENCOUNTER — Emergency Department: Payer: Self-pay | Admitting: Emergency Medicine

## 2013-12-30 ENCOUNTER — Ambulatory Visit: Payer: Self-pay | Admitting: Family Medicine

## 2014-01-21 ENCOUNTER — Encounter: Payer: Self-pay | Admitting: Family Medicine

## 2014-01-25 ENCOUNTER — Encounter: Payer: Self-pay | Admitting: Family Medicine

## 2014-03-19 ENCOUNTER — Ambulatory Visit: Payer: Self-pay | Admitting: Family Medicine

## 2014-05-21 ENCOUNTER — Ambulatory Visit: Payer: Self-pay | Admitting: Family Medicine

## 2015-02-10 LAB — HEMOGLOBIN A1C: Hgb A1c MFr Bld: 6.7 % — AB (ref 4.0–6.0)

## 2015-03-29 LAB — LIPID PANEL
Cholesterol: 168 mg/dL (ref 0–200)
HDL: 43 mg/dL (ref 35–70)
LDL Cholesterol: 80 mg/dL
Triglycerides: 226 mg/dL — AB (ref 40–160)

## 2015-06-04 DIAGNOSIS — I252 Old myocardial infarction: Secondary | ICD-10-CM | POA: Insufficient documentation

## 2015-06-04 DIAGNOSIS — M5416 Radiculopathy, lumbar region: Secondary | ICD-10-CM | POA: Insufficient documentation

## 2015-06-04 DIAGNOSIS — S92309A Fracture of unspecified metatarsal bone(s), unspecified foot, initial encounter for closed fracture: Secondary | ICD-10-CM | POA: Insufficient documentation

## 2015-06-09 ENCOUNTER — Ambulatory Visit: Payer: Self-pay | Admitting: Family Medicine

## 2015-06-09 ENCOUNTER — Encounter: Payer: Self-pay | Admitting: Family Medicine

## 2015-06-09 ENCOUNTER — Ambulatory Visit (INDEPENDENT_AMBULATORY_CARE_PROVIDER_SITE_OTHER): Payer: BLUE CROSS/BLUE SHIELD | Admitting: Family Medicine

## 2015-06-09 VITALS — BP 120/88 | HR 67 | Temp 98.5°F | Resp 16 | Wt 209.0 lb

## 2015-06-09 DIAGNOSIS — I10 Essential (primary) hypertension: Secondary | ICD-10-CM | POA: Diagnosis not present

## 2015-06-09 DIAGNOSIS — I2510 Atherosclerotic heart disease of native coronary artery without angina pectoris: Secondary | ICD-10-CM | POA: Diagnosis not present

## 2015-06-09 DIAGNOSIS — E119 Type 2 diabetes mellitus without complications: Secondary | ICD-10-CM | POA: Diagnosis not present

## 2015-06-09 DIAGNOSIS — S92301S Fracture of unspecified metatarsal bone(s), right foot, sequela: Secondary | ICD-10-CM | POA: Diagnosis not present

## 2015-06-09 LAB — POCT GLYCOSYLATED HEMOGLOBIN (HGB A1C)
ESTIMATED AVERAGE GLUCOSE: 148
Hemoglobin A1C: 6.8

## 2015-06-09 MED ORDER — IBUPROFEN 800 MG PO TABS: 800.0000 mg | ORAL_TABLET | Freq: Three times a day (TID) | ORAL | Status: DC | PRN

## 2015-06-09 MED ORDER — IBUPROFEN 800 MG PO TABS
800.0000 mg | ORAL_TABLET | Freq: Three times a day (TID) | ORAL | Status: DC | PRN
Start: 2015-06-09 — End: 2015-06-09

## 2015-06-09 MED ORDER — OXYCODONE-ACETAMINOPHEN 10-325 MG PO TABS: 1.0000 | ORAL_TABLET | Freq: Three times a day (TID) | ORAL | Status: DC | PRN

## 2015-06-09 NOTE — Progress Notes (Signed)
Patient: Craig York Male    DOB: 1950/12/19   64 y.o.   MRN: [id number] Visit Date: 06/09/2015  Today's Provider: Lelon Huh, MD   Chief Complaint  Patient presents with  . Follow-up    4 months  . Diabetes  . Hypertension  . Hyperlipidemia  . Foot pain   Subjective:    Diabetes He presents for his follow-up diabetic visit. He has type 2 diabetes mellitus. His disease course has been stable. Pertinent negatives for hypoglycemia include no dizziness, headaches or mood changes. Pertinent negatives for diabetes include no blurred vision, no chest pain, no fatigue, no foot paresthesias and no visual change.  Hypertension This is a recurrent problem. The current episode started more than 1 year ago. The problem is unchanged. Pertinent negatives include no blurred vision, chest pain, headaches, malaise/fatigue, palpitations or shortness of breath.  Hyperlipidemia This is a recurrent problem. The current episode started more than 1 year ago. The problem is controlled. Exacerbating diseases include diabetes. Pertinent negatives include no chest pain or shortness of breath. Risk factors for coronary artery disease include diabetes mellitus and hypertension.  Erectile Dysfunction This is a recurrent problem. The current episode started more than 1 year ago.        Diabetes Mellitus Type II, Follow-up:   Lab Results  Component Value Date   HGBA1C 6.7* 02/10/2015   HGBA1C  04/29/2008    5.5 (NOTE)   The ADA recommends the following therapeutic goals for glycemic   control related to Hgb A1C measurement:   Goal of Therapy:   < 7.0% Hgb A1C   Action Suggested:  > 8.0% Hgb A1C   Ref:  Diabetes Care, 71, Suppl. 1, 1999   Last seen for diabetes 2 months ago.  Management changes included non changes. He reports good compliance with treatment. He is not having side effects. none Current symptoms include weight loss and right leg pain and have been unchanged. Home blood sugar  records: does not recall  Episodes of hypoglycemia? no   Current diet: well balanced Current exercise: some walking, biking, and walks at work  ------------------------------------------------------------------------   Hypertension, follow-up:  BP Readings from Last 3 Encounters:  06/09/15 120/88  04/18/15 150/68    He was last seen for hypertension 2 months ago.  BP at that visit was 150/68. Management changes since that visit include restarted amlodipine .He reports good compliance with treatment. He is not having side effects. none  He is exercising. He is adherent to low salt diet.   Outside blood pressures are 130/90. He is experiencing none.  Patient denies all of the above.   Cardiovascular risk factors include none   ------------------------------------------------------------------------    Lipid/Cholesterol, Follow-up:   Last seen for this 2 months ago.  Management changes since that visit include none.  Last Lipid Panel:    Component Value Date/Time   CHOL 168 03/29/2015   TRIG 226* 03/29/2015   HDL 43 03/29/2015   CHOLHDL 5.5 02/05/2008 0355   VLDL 23 02/05/2008 0355   LDLCALC 80 03/29/2015    He reports good compliance with treatment. He is not having side effects. none  Wt Readings from Last 3 Encounters:  06/09/15 209 lb (94.802 kg)  04/18/15 210 lb (95.255 kg)    ------------------------------------------------------------------------   Foot pain Patient fractured right metatarsal and was followed by podiatry for a few months. Has mostly healed, but pain flares when he works long hours and is on  feet for extended periods of time. Usually responds well to rest. Has flared up fairly severe this week and is unable to stand for work.   Allergies  Allergen Reactions  . Simvastatin     Elevated CK  . Penicillins Rash   Previous Medications   AMLODIPINE (NORVASC) 10 MG TABLET    Take 1 tablet by mouth daily.   ASPIRIN 81 MG TABLET     Take 1 tablet by mouth daily.   GLIPIZIDE (GLUCOTROL) 5 MG TABLET    Take 1 tablet by mouth daily.   HYDROCHLOROTHIAZIDE (HYDRODIURIL) 50 MG TABLET    Take 1 tablet by mouth daily.   HYDROCODONE-ACETAMINOPHEN (NORCO) 10-325 MG PER TABLET    Take 1 tablet by mouth. Every 6 hours, as needed   METFORMIN (GLUCOPHAGE) 500 MG TABLET    Take 1 tablet by mouth. Take 1 tablet in the morning, and 1 tablet in the evening.   TADALAFIL (CIALIS) 5 MG TABLET    Take 1-2 tablets by mouth as needed.   VALSARTAN (DIOVAN) 80 MG TABLET    Take 1 tablet by mouth daily.    Review of Systems  Constitutional: Negative for malaise/fatigue and fatigue.  Eyes: Negative for blurred vision.  Respiratory: Negative for shortness of breath.   Cardiovascular: Negative for chest pain and palpitations.  Neurological: Negative for dizziness, light-headedness and headaches.    History  Substance Use Topics  . Smoking status: Never Smoker   . Smokeless tobacco: Former Systems developer    Types: [place] date: 11/27/1968     Comment: used 2 packs per week  . Alcohol Use: 0.0 oz/week    0 Standard drinks or equivalent per week     Comment: occasional, drinks beer    Objective:   BP 120/88 mmHg  Pulse 67  Temp(Src) 98.5 F (36.9 C) (Oral)  Resp 16  Wt 209 lb (94.802 kg)  SpO2 98%  Physical Exam  General Appearance:    Alert, cooperative, no distress  Eyes:    PERRL, conjunctiva/corneas clear, EOM's intact       Lungs:     Clear to auscultation bilaterally, respirations unlabored  Heart:    Regular rate and rhythm  Neurologic:   Awake, alert, oriented x 3. No apparent focal neurological           defect.   MS:   Moderated tenderness of right metatarsal with mild swelling and no erythema. FROM of MTP and IPs.         Assessment & Plan:      1. Coronary artery disease involving native coronary artery of native heart without angina pectoris Asymptomatic. Compliant with medication.  Continue aggressive risk factor  modification.    2. Type 2 diabetes mellitus without complication well controlled Continue current medications.   - POCT glycosylated hemoglobin (Hb A1C)  3. Essential (primary) hypertension well controlled  4. Fracture of metatarsal bone, right, sequela Work excuse to return to work on Tuesday July 19th  Follow up: Return in about 4 months (around 10/10/2015).      Lelon Huh, MD  [facility]

## 2015-08-23 ENCOUNTER — Other Ambulatory Visit: Payer: Self-pay | Admitting: Family Medicine

## 2015-08-23 NOTE — Telephone Encounter (Signed)
Pt contacted office for refill request on the following medications:  valsartan (DIOVAN) 80 MG and metFORMIN (GLUCOPHAGE) 500 MG.  Agenda  CB#[phone]/MW

## 2015-08-24 MED ORDER — METFORMIN HCL 500 MG PO TABS: 500.0000 mg | ORAL_TABLET | Freq: Two times a day (BID) | ORAL | Status: DC

## 2015-08-24 MED ORDER — VALSARTAN 80 MG PO TABS: 80.0000 mg | ORAL_TABLET | Freq: Every day | ORAL | Status: DC

## 2015-08-25 ENCOUNTER — Other Ambulatory Visit: Payer: Self-pay | Admitting: *Deleted

## 2015-08-25 MED ORDER — VALSARTAN 80 MG PO TABS: 80.0000 mg | ORAL_TABLET | Freq: Every day | ORAL | Status: DC

## 2015-08-25 MED ORDER — METFORMIN HCL 500 MG PO TABS: 500.0000 mg | ORAL_TABLET | Freq: Two times a day (BID) | ORAL | Status: DC

## 2015-08-25 NOTE — Telephone Encounter (Signed)
Rx's were sent to the wrong pharmacy 08/24/2015. Resent to [facility] [address]. Canceled rx's that were sent to [facility].

## 2015-10-06 ENCOUNTER — Encounter: Payer: Self-pay | Admitting: Family Medicine

## 2015-10-06 ENCOUNTER — Ambulatory Visit (INDEPENDENT_AMBULATORY_CARE_PROVIDER_SITE_OTHER): Payer: Self-pay | Admitting: Family Medicine

## 2015-10-06 VITALS — BP 124/80 | HR 86 | Temp 97.9°F | Resp 16 | Wt 206.0 lb

## 2015-10-06 DIAGNOSIS — N528 Other male erectile dysfunction: Secondary | ICD-10-CM

## 2015-10-06 DIAGNOSIS — IMO0001 Reserved for inherently not codable concepts without codable children: Secondary | ICD-10-CM

## 2015-10-06 DIAGNOSIS — N529 Male erectile dysfunction, unspecified: Secondary | ICD-10-CM

## 2015-10-06 DIAGNOSIS — E1165 Type 2 diabetes mellitus with hyperglycemia: Secondary | ICD-10-CM

## 2015-10-06 DIAGNOSIS — I2510 Atherosclerotic heart disease of native coronary artery without angina pectoris: Secondary | ICD-10-CM

## 2015-10-06 LAB — POCT GLYCOSYLATED HEMOGLOBIN (HGB A1C)
ESTIMATED AVERAGE GLUCOSE: 226
Hemoglobin A1C: 9.5

## 2015-10-06 MED ORDER — METFORMIN HCL 500 MG PO TABS: 1000.0000 mg | ORAL_TABLET | Freq: Two times a day (BID) | ORAL | Status: DC

## 2015-10-06 MED ORDER — OXYCODONE-ACETAMINOPHEN 10-325 MG PO TABS: 1.0000 | ORAL_TABLET | Freq: Three times a day (TID) | ORAL | Status: DC | PRN

## 2015-10-06 MED ORDER — TADALAFIL 5 MG PO TABS: 5.0000 mg | ORAL_TABLET | ORAL | Status: DC | PRN

## 2015-10-06 NOTE — Progress Notes (Signed)
Patient: Craig York Male    DOB: December 30, 1950   64 y.o.   MRN: [id number] Visit Date: 10/06/2015  Today's Provider: Lelon Huh, MD   Chief Complaint  Patient presents with  . Diabetes    follow up  . Hypertension    follow up  . Coronary Artery Disease    follow up   Subjective:    HPI   Diabetes Mellitus Type II, Follow-up:   Lab Results  Component Value Date   HGBA1C 6.8 06/09/2015   HGBA1C 6.7* 02/10/2015   HGBA1C  04/29/2008    5.5 (NOTE)   The ADA recommends the following therapeutic goals for glycemic   control related to Hgb A1C measurement:   Goal of Therapy:   < 7.0% Hgb A1C   Action Suggested:  > 8.0% Hgb A1C   Ref:  Diabetes Care, 22, Suppl. 1, 1999    Last seen for diabetes 4 months ago.  Management since then includes no changes. He reports good compliance with treatment. He is not having side effects.  Current symptoms include none and have been stable. Home blood sugar records: not being checked  Episodes of hypoglycemia? no   Current Insulin Regimen: none Most Recent Eye Exam: <1 year Weight trend: decreasing steadily Prior visit with dietician: no Current diet: in general, a "healthy" diet   Current exercise: walking  Pertinent Labs:    Component Value Date/Time   CHOL 168 03/29/2015   TRIG 226* 03/29/2015   CHOLHDL 5.5 02/05/2008 0355   CREATININE 0.97 05/02/2008 0355    Wt Readings from Last 3 Encounters:  10/06/15 206 lb (93.441 kg)  06/09/15 209 lb (94.802 kg)  04/18/15 210 lb (95.255 kg)    ------------------------------------------------------------------------   Hypertension, follow-up:  BP Readings from Last 3 Encounters:  10/06/15 124/80  06/09/15 120/88  04/18/15 150/68    He was last seen for hypertension 4 months ago.  BP at that visit was 120/88. Management since that visit includes no changes. He reports good compliance with treatment. He is not having side effects.  He is exercising. He is  adherent to low salt diet.   Outside blood pressures are not being checked. He is experiencing none.  Patient denies chest pain, chest pressure/discomfort, claudication, dyspnea, exertional chest pressure/discomfort, fatigue, irregular heart beat, lower extremity edema, near-syncope, orthopnea, palpitations, paroxysmal nocturnal dyspnea, syncope and tachypnea.   Cardiovascular risk factors include advanced age (older than 59 for men, 73 for women), diabetes mellitus, hypertension and male gender.  Use of agents associated with hypertension: NSAIDS.     Weight trend: decreasing steadily Wt Readings from Last 3 Encounters:  10/06/15 206 lb (93.441 kg)  06/09/15 209 lb (94.802 kg)  04/18/15 210 lb (95.255 kg)    Current diet: in general, a "healthy" diet    ------------------------------------------------------------------------  Follow up CAD: Last office visit was 4 months ago and no changes were made.     Allergies  Allergen Reactions  . Simvastatin     Elevated CK  . Penicillins Rash   Previous Medications   AMLODIPINE (NORVASC) 10 MG TABLET    Take 1 tablet by mouth daily.   ASPIRIN 81 MG TABLET    Take 1 tablet by mouth daily.   GLIPIZIDE (GLUCOTROL) 5 MG TABLET    Take 1 tablet by mouth daily.   HYDROCHLOROTHIAZIDE (HYDRODIURIL) 50 MG TABLET    Take 1 tablet by mouth daily.   IBUPROFEN (ADVIL,MOTRIN) 800 MG TABLET  Take 1 tablet (800 mg total) by mouth every 8 (eight) hours as needed (take with food).   METFORMIN (GLUCOPHAGE) 500 MG TABLET    Take 1 tablet (500 mg total) by mouth 2 (two) times daily with a meal.   OXYCODONE-ACETAMINOPHEN (PERCOCET) 10-325 MG PER TABLET    Take 1 tablet by mouth every 8 (eight) hours as needed for pain.   TADALAFIL (CIALIS) 5 MG TABLET    Take 1-2 tablets by mouth as needed.   VALSARTAN (DIOVAN) 80 MG TABLET    Take 1 tablet (80 mg total) by mouth daily.    Review of Systems  Constitutional: Negative for fever, chills and appetite  change.  Respiratory: Negative for chest tightness, shortness of breath and wheezing.   Cardiovascular: Negative for chest pain and palpitations.  Gastrointestinal: Negative for nausea, vomiting and abdominal pain.    Social History  Substance Use Topics  . Smoking status: Never Smoker   . Smokeless tobacco: Former Systems developer    Types: [place] date: 11/27/1968     Comment: used 2 packs per week  . Alcohol Use: 0.0 oz/week    0 Standard drinks or equivalent per week     Comment: occasional, drinks beer    Objective:   BP 124/80 mmHg  Pulse 86  Temp(Src) 97.9 F (36.6 C) (Oral)  Resp 16  Wt 206 lb (93.441 kg)  SpO2 98%  Physical Exam   General Appearance:    Alert, cooperative, no distress  Eyes:    PERRL, conjunctiva/corneas clear, EOM's intact       Lungs:     Clear to auscultation bilaterally, respirations unlabored  Heart:    Regular rate and rhythm  Neurologic:   Awake, alert, oriented x 3. No apparent focal neurological           defect.         Results for orders placed or performed in visit on 10/06/15  POCT HgB A1C  Result Value Ref Range   Hemoglobin A1C 9.5    Est. average glucose Bld gHb Est-mCnc 226        Assessment & Plan:     1. 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 He is going to try to be more complaint with medications and diet. He states he take 2 metformin twice a day when he does take it, and advised to be sure to take it every day.  - POCT HgB A1C  2. Coronary artery disease involving native coronary artery of native heart without angina pectoris Asymptomatic. Compliant with medication.  Continue aggressive risk factor modification.    3. ED (erectile dysfunction) of organic origin Rx with voucher for Cialis 5mg  daily.         Lelon Huh, MD  [facility]

## 2015-11-03 ENCOUNTER — Other Ambulatory Visit: Payer: Self-pay | Admitting: Family Medicine

## 2015-11-03 NOTE — Telephone Encounter (Signed)
Pt contacted office for refill request on the following medications: °oxyCODONE-acetaminophen (PERCOCET) 10-325 MG tablet. Thanks TNP °

## 2015-11-04 ENCOUNTER — Telehealth: Payer: Self-pay | Admitting: Family Medicine

## 2015-11-04 MED ORDER — OXYCODONE-ACETAMINOPHEN 10-325 MG PO TABS: 1.0000 | ORAL_TABLET | Freq: Three times a day (TID) | ORAL | Status: DC | PRN

## 2015-11-04 NOTE — Telephone Encounter (Signed)
Pt needs refill on HCTZ 50 mg. Sent to [facility] on [address]

## 2015-11-04 NOTE — Addendum Note (Signed)
Addended by: [name] on: 11/04/2015 08:16 AM   Modules accepted: Orders

## 2015-11-05 MED ORDER — HYDROCHLOROTHIAZIDE 50 MG PO TABS: 50.0000 mg | ORAL_TABLET | Freq: Every day | ORAL | Status: DC

## 2015-12-07 ENCOUNTER — Other Ambulatory Visit: Payer: Self-pay | Admitting: Family Medicine

## 2015-12-07 MED ORDER — OXYCODONE-ACETAMINOPHEN 10-325 MG PO TABS: 1.0000 | ORAL_TABLET | Freq: Three times a day (TID) | ORAL | Status: DC | PRN

## 2015-12-07 NOTE — Telephone Encounter (Signed)
Pt needs refill oxyCODONE-acetaminophen (PERCOCET) 10-325 MG tablet 11/04/15 -- [name], MD Take 1 tablet by mouth every 8 (eight) hours as needed for pain  ThanksTeri

## 2016-01-06 ENCOUNTER — Ambulatory Visit (INDEPENDENT_AMBULATORY_CARE_PROVIDER_SITE_OTHER): Payer: Self-pay | Admitting: Family Medicine

## 2016-01-06 ENCOUNTER — Encounter: Payer: Self-pay | Admitting: Family Medicine

## 2016-01-06 VITALS — BP 120/60 | HR 83 | Temp 98.2°F | Resp 16 | Ht 73.0 in | Wt 205.0 lb

## 2016-01-06 DIAGNOSIS — I10 Essential (primary) hypertension: Secondary | ICD-10-CM

## 2016-01-06 DIAGNOSIS — E119 Type 2 diabetes mellitus without complications: Secondary | ICD-10-CM

## 2016-01-06 DIAGNOSIS — M5416 Radiculopathy, lumbar region: Secondary | ICD-10-CM

## 2016-01-06 LAB — POCT GLYCOSYLATED HEMOGLOBIN (HGB A1C)
ESTIMATED AVERAGE GLUCOSE: 301
HEMOGLOBIN A1C: 12.1

## 2016-01-06 MED ORDER — OXYCODONE-ACETAMINOPHEN 10-325 MG PO TABS: 1.0000 | ORAL_TABLET | Freq: Three times a day (TID) | ORAL | Status: DC | PRN

## 2016-01-06 MED ORDER — GLIPIZIDE 10 MG PO TABS: 10.0000 mg | ORAL_TABLET | Freq: Every day | ORAL | Status: DC

## 2016-01-06 MED ORDER — HYDROCHLOROTHIAZIDE 50 MG PO TABS: 50.0000 mg | ORAL_TABLET | Freq: Every day | ORAL | Status: DC

## 2016-01-06 MED ORDER — AMLODIPINE BESYLATE 10 MG PO TABS: 10.0000 mg | ORAL_TABLET | Freq: Every day | ORAL | Status: DC

## 2016-01-06 NOTE — Progress Notes (Signed)
Patient: Craig York Male    DOB: [birth year]   65 y.o.   MRN: [id number] Visit Date: 01/06/2016  Today's Provider: Lelon Huh, MD   Chief Complaint  Patient presents with  . Follow-up  . Coronary Artery Disease  . Diabetes  . Hypertension   Subjective:    HPI    Follow-up for Coronary Artery Disease from 10/06/2015; no changes.    Diabetes Mellitus Type II, Follow-up:   Lab Results  Component Value Date   HGBA1C 9.5 10/06/2015   HGBA1C 6.8 06/09/2015   HGBA1C 6.7* 02/10/2015   Last seen for diabetes 3 months ago.  Management since then includes; counseled to take medication qd and work on diet. He reports fair compliance with treatment. He is not having side effects. none Current symptoms include none and have been stable. Home blood sugar records: fasting range: n/a  Episodes of hypoglycemia? no   Current Insulin Regimen: n/a Most Recent Eye Exam: 10/2015 Weight trend: stable Prior visit with dietician: no Current diet: well balanced Current exercise: walking  ----------------------------------------------------------------------   Hypertension, follow-up:  BP Readings from Last 3 Encounters:  01/06/16 120/60  10/06/15 124/80  06/09/15 120/88    He was last seen for hypertension 7 months ago.  BP at that visit was 120/88. Management since that visit includes; no changes .He reports good compliance with treatment. He is not having side effects. none  He is exercising. He is adherent to low salt diet.   Outside blood pressures are 130/80. He is experiencing none.  Patient denies none.   Cardiovascular risk factors include diabetes.  Use of agents associated with hypertension: none.   ----------------------------------------------------------------------     Allergies  Allergen Reactions  . Simvastatin     Elevated CK  . Penicillins Rash   Previous Medications   AMLODIPINE (NORVASC) 10 MG TABLET    Take 1 tablet by mouth  daily.   ASPIRIN 81 MG TABLET    Take 1 tablet by mouth daily.   GLIPIZIDE (GLUCOTROL) 5 MG TABLET    Take 1 tablet by mouth daily.   HYDROCHLOROTHIAZIDE (HYDRODIURIL) 50 MG TABLET    Take 1 tablet (50 mg total) by mouth daily.   IBUPROFEN (ADVIL,MOTRIN) 800 MG TABLET    Take 1 tablet (800 mg total) by mouth every 8 (eight) hours as needed (take with food).   METFORMIN (GLUCOPHAGE) 500 MG TABLET    Take 2 tablets (1,000 mg total) by mouth 2 (two) times daily with a meal.   OXYCODONE-ACETAMINOPHEN (PERCOCET) 10-325 MG TABLET    Take 1 tablet by mouth every 8 (eight) hours as needed for pain.   TADALAFIL (CIALIS) 5 MG TABLET    Take 1-2 tablets (5-10 mg total) by mouth as needed.   VALSARTAN (DIOVAN) 80 MG TABLET    Take 1 tablet (80 mg total) by mouth daily.    Review of Systems  Constitutional: Negative for fever, chills and appetite change.  Respiratory: Negative for chest tightness, shortness of breath and wheezing.   Cardiovascular: Negative for chest pain and palpitations.  Gastrointestinal: Negative for nausea, vomiting and abdominal pain.  Neurological: Positive for light-headedness.       Occasionally when he get out of bed to fast, he will have lightheadedness     Social History  Substance Use Topics  . Smoking status: Never Smoker   . Smokeless tobacco: Former Systems developer    Types: [place] date: 11/27/1968  Comment: used 2 packs per week  . Alcohol Use: 0.0 oz/week    0 Standard drinks or equivalent per week     Comment: occasional, drinks beer    Objective:   BP 120/60 mmHg  Pulse 83  Temp(Src) 98.2 F (36.8 C) (Oral)  Resp 16  Ht 6\' 1"  (1.854 m)  Wt 205 lb (92.987 kg)  BMI 27.05 kg/m2  SpO2 98%  Physical Exam    General Appearance:    Alert, cooperative, no distress  Eyes:    PERRL, conjunctiva/corneas clear, EOM's intact       Lungs:     Clear to auscultation bilaterally, respirations unlabored  Heart:    Regular rate and rhythm  Neurologic:   Awake,  alert, oriented x 3. No apparent focal neurological           defect.        Results for orders placed or performed in visit on 01/06/16  POCT glycosylated hemoglobin (Hb A1C)  Result Value Ref Range   Hemoglobin A1C 12.1    Est. average glucose Bld gHb Est-mCnc 301        Assessment & Plan:     1. Type 2 diabetes mellitus without complication, without long-term current use of insulin (HCC) Uncontrolled. Counseled on dietary changes. Will increase glipizide to 10mg  daily. Consider adding additional medication if not greatly improved at follow up in 3 months.  - POCT glycosylated hemoglobin (Hb A1C) - glipiZIDE (GLUCOTROL) 10 MG tablet; Take 1 tablet (10 mg total) by mouth daily.  Dispense: 90 tablet; Refill: 1  2. Essential (primary) hypertension Well controlled.  Continue current medications.    - amLODipine (NORVASC) 10 MG tablet; Take 1 tablet (10 mg total) by mouth daily.  Dispense: 90 tablet; Refill: 0 - hydrochlorothiazide (HYDRODIURIL) 50 MG tablet; Take 1 tablet (50 mg total) by mouth daily.  Dispense: 90 tablet; Refill: 4  3. Lumbar radiculopathy Doing well with current pain medicatoin - oxyCODONE-acetaminophen (PERCOCET) 10-325 MG tablet; Take 1 tablet by mouth every 8 (eight) hours as needed for pain.  Dispense: 30 tablet; Refill: 0  Follow up in 3 months.       Lelon Huh, MD  [facility]

## 2016-02-02 ENCOUNTER — Other Ambulatory Visit: Payer: Self-pay

## 2016-02-02 DIAGNOSIS — M5416 Radiculopathy, lumbar region: Secondary | ICD-10-CM

## 2016-02-02 NOTE — Telephone Encounter (Signed)
Patient called for refill on Oxycodone-Acet as it is in the med list, please review-aa

## 2016-02-03 NOTE — Telephone Encounter (Signed)
Pt called to see if RX for oxyCODONE-acetaminophen (PERCOCET) 10-325 MG tablet was ready. Thanks TNP

## 2016-02-04 MED ORDER — OXYCODONE-ACETAMINOPHEN 10-325 MG PO TABS: 1.0000 | ORAL_TABLET | Freq: Three times a day (TID) | ORAL | Status: DC | PRN

## 2016-02-07 ENCOUNTER — Telehealth: Payer: Self-pay | Admitting: Family Medicine

## 2016-02-29 ENCOUNTER — Other Ambulatory Visit: Payer: Self-pay | Admitting: Family Medicine

## 2016-03-03 ENCOUNTER — Other Ambulatory Visit: Payer: Self-pay | Admitting: Family Medicine

## 2016-03-03 DIAGNOSIS — M5416 Radiculopathy, lumbar region: Secondary | ICD-10-CM

## 2016-03-03 MED ORDER — OXYCODONE-ACETAMINOPHEN 10-325 MG PO TABS: 1.0000 | ORAL_TABLET | Freq: Three times a day (TID) | ORAL | Status: DC | PRN

## 2016-03-03 NOTE — Telephone Encounter (Signed)
Pt contacted office for refill request on the following medications: ° °oxyCODONE-acetaminophen (PERCOCET) 10-325 MG tablet ° °CB#[phone]/MW °

## 2016-03-03 NOTE — Telephone Encounter (Signed)
Please review. Thanks!  

## 2016-04-03 ENCOUNTER — Other Ambulatory Visit: Payer: Self-pay | Admitting: Family Medicine

## 2016-04-03 DIAGNOSIS — M5416 Radiculopathy, lumbar region: Secondary | ICD-10-CM

## 2016-04-03 MED ORDER — OXYCODONE-ACETAMINOPHEN 10-325 MG PO TABS: 1.0000 | ORAL_TABLET | Freq: Three times a day (TID) | ORAL | Status: DC | PRN

## 2016-04-03 NOTE — Telephone Encounter (Signed)
Pt contacted office for refill request on the following medications: ° °oxyCODONE-acetaminophen (PERCOCET) 10-325 MG tablet ° °CB#[phone]/MW °

## 2016-04-12 ENCOUNTER — Ambulatory Visit (INDEPENDENT_AMBULATORY_CARE_PROVIDER_SITE_OTHER): Payer: Self-pay | Admitting: Family Medicine

## 2016-04-12 ENCOUNTER — Encounter: Payer: Self-pay | Admitting: Family Medicine

## 2016-04-12 VITALS — HR 73 | Temp 98.6°F | Resp 16 | Ht 73.0 in | Wt 205.0 lb

## 2016-04-12 DIAGNOSIS — E119 Type 2 diabetes mellitus without complications: Secondary | ICD-10-CM

## 2016-04-12 DIAGNOSIS — I10 Essential (primary) hypertension: Secondary | ICD-10-CM

## 2016-04-12 LAB — POCT GLYCOSYLATED HEMOGLOBIN (HGB A1C)
ESTIMATED AVERAGE GLUCOSE: 243
HEMOGLOBIN A1C: 10.1

## 2016-04-12 MED ORDER — EMPAGLIFLOZIN 10 MG PO TABS: 10.0000 mg | ORAL_TABLET | Freq: Every day | ORAL | Status: DC

## 2016-04-12 MED ORDER — HYDROCHLOROTHIAZIDE 50 MG PO TABS: 25.0000 mg | ORAL_TABLET | Freq: Every day | ORAL | Status: DC

## 2016-04-12 NOTE — Patient Instructions (Addendum)
Reduce hctz to 1/2 tablet daily  Add Jardiance 10mg  once a day to current medications

## 2016-04-12 NOTE — Progress Notes (Signed)
Patient: Craig York Male    DOB: 12-02-1950   65 y.o.   MRN: [id number] Visit Date: 04/12/2016  Today's Provider: Lelon Huh, MD   Chief Complaint  Patient presents with  . Follow-up  . Diabetes  . Hypertension   Subjective:    HPI      Diabetes Mellitus Type II, Follow-up:   Lab Results  Component Value Date   HGBA1C 12.1 01/06/2016   HGBA1C 9.5 10/06/2015   HGBA1C 6.8 06/09/2015   Last seen for diabetes 3 months ago.  Management since then includes; increased glipizide to 10 mg qd. counseled on dietary changes.  He reports fair compliance with treatment. He is not having side effects. none Current symptoms include none and have been unchanged. Home blood sugar records: fasting range: not checking  Episodes of hypoglycemia? no   Current Insulin Regimen: n/a Most Recent Eye Exam: due Weight trend: stable Prior visit with dietician: no Current diet: well balanced Current exercise: walking  ----------------------------------------------------------------------    Hypertension, follow-up:  BP Readings from Last 3 Encounters:  01/06/16 120/60  10/06/15 124/80  06/09/15 120/88    He was last seen for hypertension 3 months ago.  BP at that visit was 120/60. Management since that visit includes; no changes, continue current medications .He reports good compliance with treatment. He is not having side effects. none  He is exercising. He is adherent to low salt diet.   Outside blood pressures are n/a. He is experiencing none.  Patient denies none.   Cardiovascular risk factors include diabetes mellitus.  Use of agents associated with hypertension: none.   ----------------------------------------------------------------------    Allergies  Allergen Reactions  . Simvastatin     Elevated CK  . Penicillins Rash   Previous Medications   AMLODIPINE (NORVASC) 10 MG TABLET    TAKE ONE TABLET BY MOUTH ONCE DAILY   ASPIRIN 81 MG TABLET     Take 1 tablet by mouth daily.   GLIPIZIDE (GLUCOTROL) 10 MG TABLET    Take 1 tablet (10 mg total) by mouth daily.   HYDROCHLOROTHIAZIDE (HYDRODIURIL) 50 MG TABLET    Take 1 tablet (50 mg total) by mouth daily.   IBUPROFEN (ADVIL,MOTRIN) 800 MG TABLET    Take 1 tablet (800 mg total) by mouth every 8 (eight) hours as needed (take with food).   METFORMIN (GLUCOPHAGE) 500 MG TABLET    Take 2 tablets (1,000 mg total) by mouth 2 (two) times daily with a meal.   OXYCODONE-ACETAMINOPHEN (PERCOCET) 10-325 MG TABLET    Take 1 tablet by mouth every 8 (eight) hours as needed for pain.   TADALAFIL (CIALIS) 5 MG TABLET    Take 1-2 tablets (5-10 mg total) by mouth as needed.   VALSARTAN (DIOVAN) 80 MG TABLET    Take 1 tablet (80 mg total) by mouth daily.    Review of Systems  Constitutional: Negative for fever, chills and appetite change.  Respiratory: Negative for chest tightness, shortness of breath and wheezing.   Cardiovascular: Negative for chest pain and palpitations.  Gastrointestinal: Negative for nausea, vomiting and abdominal pain.    Social History  Substance Use Topics  . Smoking status: Never Smoker   . Smokeless tobacco: Former Systems developer    Types: [place] date: 11/27/1968     Comment: used 2 packs per week  . Alcohol Use: 0.0 oz/week    0 Standard drinks or equivalent per week  Comment: occasional, drinks beer    Objective:   Pulse 73  Temp(Src) 98.6 F (37 C) (Oral)  Resp 16  Ht 6\' 1"  (1.854 m)  Wt 205 lb (92.987 kg)  BMI 27.05 kg/m2  SpO2 98%  Physical Exam   General Appearance:    Alert, cooperative, no distress  Eyes:    PERRL, conjunctiva/corneas clear, EOM's intact       Lungs:     Clear to auscultation bilaterally, respirations unlabored  Heart:    Regular rate and rhythm  Neurologic:   Awake, alert, oriented x 3. No apparent focal neurological           defect.        Results for orders placed or performed in visit on 04/12/16  POCT glycosylated hemoglobin  (Hb A1C)  Result Value Ref Range   Hemoglobin A1C 10.1    Est. average glucose Bld gHb Est-mCnc 243        Assessment & Plan:     1. Type 2 diabetes mellitus without complication, without long-term current use of insulin (HCC) Improved with increased dose of glipizide, but not controlled. Add samples of Jardiance 10mg  daily, #35. Contact patient in one month for renal panel.  - POCT glycosylated hemoglobin (Hb A1C)  2. Essential (primary) hypertension Reduce hctz to 1/2 tablets daily since we are adding Jardiance - hydrochlorothiazide (HYDRODIURIL) 50 MG tablet; Take 0.5 tablets (25 mg total) by mouth daily.  Dispense: 1 tablet; Refill: 1 - empagliflozin (JARDIANCE) 10 MG TABS tablet; Take 10 mg by mouth daily.  Dispense: 35 tablet; Refill: 0       Lelon Huh, MD  [facility]

## 2016-05-03 ENCOUNTER — Other Ambulatory Visit: Payer: Self-pay | Admitting: Family Medicine

## 2016-05-03 DIAGNOSIS — M5416 Radiculopathy, lumbar region: Secondary | ICD-10-CM

## 2016-05-03 NOTE — Telephone Encounter (Signed)
Pt needs refill oxyCODONE-acetaminophen (PERCOCET) 10-325 MG tablet   Please call when ready for pick up [phone]  Thanks Con Memos

## 2016-05-04 MED ORDER — OXYCODONE-ACETAMINOPHEN 10-325 MG PO TABS
1.0000 | ORAL_TABLET | Freq: Three times a day (TID) | ORAL | Status: DC | PRN
Start: 2016-05-04 — End: 2016-06-05

## 2016-05-17 ENCOUNTER — Other Ambulatory Visit: Payer: Self-pay | Admitting: Family Medicine

## 2016-05-17 DIAGNOSIS — I10 Essential (primary) hypertension: Secondary | ICD-10-CM

## 2016-05-17 MED ORDER — EMPAGLIFLOZIN 10 MG PO TABS: 10.0000 mg | ORAL_TABLET | Freq: Every day | ORAL | Status: DC

## 2016-05-17 NOTE — Telephone Encounter (Signed)
Pt requesting medication refill of empagliflozin (JARDIANCE) 10 MG TABS tablet sent to [facility] [address].  He is totally out.

## 2016-05-24 ENCOUNTER — Other Ambulatory Visit: Payer: Self-pay | Admitting: Family Medicine

## 2016-05-24 ENCOUNTER — Telehealth: Payer: Self-pay | Admitting: Family Medicine

## 2016-05-24 DIAGNOSIS — I10 Essential (primary) hypertension: Secondary | ICD-10-CM

## 2016-05-24 NOTE — Telephone Encounter (Signed)
This is a pt of Dr Caryn Section.  Pt states the Rx empagliflozin (JARDIANCE) 10 MG TABS tablet is going to be $490.00 for 30 day supply and pt states he can not afford this.  Pt is requesting samples or a different Rx that is less expensive.  [facility] [address] CB#[phone]/MW

## 2016-05-24 NOTE — Telephone Encounter (Signed)
Please review, patient is on Glipizide, Metformin and then Jardiance was added on last visit on may 17th with samples.  Please review-aa

## 2016-05-26 MED ORDER — PIOGLITAZONE HCL 15 MG PO TABS: 15.0000 mg | ORAL_TABLET | Freq: Every day | ORAL | Status: DC

## 2016-05-26 NOTE — Telephone Encounter (Signed)
rx advised and RX sent in-aa

## 2016-05-26 NOTE — Telephone Encounter (Signed)
Stop Jardiance and start Pioglitazone 15 mg q am--keep f/u appt for DM.

## 2016-05-26 NOTE — Telephone Encounter (Signed)
lmtcb-aa 

## 2016-06-05 ENCOUNTER — Other Ambulatory Visit: Payer: Self-pay | Admitting: Family Medicine

## 2016-06-05 DIAGNOSIS — M5416 Radiculopathy, lumbar region: Secondary | ICD-10-CM

## 2016-06-05 MED ORDER — OXYCODONE-ACETAMINOPHEN 10-325 MG PO TABS: 1.0000 | ORAL_TABLET | Freq: Three times a day (TID) | ORAL | Status: DC | PRN

## 2016-06-05 NOTE — Telephone Encounter (Signed)
Pt contacted office for refill request on the following medications: ° °oxyCODONE-acetaminophen (PERCOCET) 10-325 MG tablet ° °CB#[phone]/MW °

## 2016-06-27 ENCOUNTER — Other Ambulatory Visit: Payer: Self-pay | Admitting: Family Medicine

## 2016-06-27 DIAGNOSIS — M5416 Radiculopathy, lumbar region: Secondary | ICD-10-CM

## 2016-06-27 MED ORDER — OXYCODONE-ACETAMINOPHEN 10-325 MG PO TABS: 1.0000 | ORAL_TABLET | Freq: Three times a day (TID) | ORAL | 0 refills | Status: DC | PRN

## 2016-06-27 NOTE — Telephone Encounter (Signed)
Pt needs RX refill.  Oxycodone 10/325 .  Please call pt when ready to pick up.  Thank steri

## 2016-07-28 ENCOUNTER — Other Ambulatory Visit: Payer: Self-pay | Admitting: Family Medicine

## 2016-07-28 DIAGNOSIS — M5416 Radiculopathy, lumbar region: Secondary | ICD-10-CM

## 2016-07-28 MED ORDER — OXYCODONE-ACETAMINOPHEN 10-325 MG PO TABS: 1.0000 | ORAL_TABLET | Freq: Three times a day (TID) | ORAL | 0 refills | Status: DC | PRN

## 2016-07-28 NOTE — Telephone Encounter (Signed)
Pt needs refill on his oxycodone 10/325  Please call when ready [phone]  [facility]

## 2016-08-15 ENCOUNTER — Ambulatory Visit (INDEPENDENT_AMBULATORY_CARE_PROVIDER_SITE_OTHER): Payer: Self-pay | Admitting: Family Medicine

## 2016-08-15 ENCOUNTER — Encounter: Payer: Self-pay | Admitting: Family Medicine

## 2016-08-15 VITALS — BP 124/80 | HR 67 | Temp 98.1°F | Resp 16 | Wt 205.0 lb

## 2016-08-15 DIAGNOSIS — I2510 Atherosclerotic heart disease of native coronary artery without angina pectoris: Secondary | ICD-10-CM

## 2016-08-15 DIAGNOSIS — E119 Type 2 diabetes mellitus without complications: Secondary | ICD-10-CM

## 2016-08-15 DIAGNOSIS — M5416 Radiculopathy, lumbar region: Secondary | ICD-10-CM

## 2016-08-15 LAB — POCT GLYCOSYLATED HEMOGLOBIN (HGB A1C)
Est. average glucose Bld gHb Est-mCnc: 197
HEMOGLOBIN A1C: 8.5

## 2016-08-15 MED ORDER — CANAGLIFLOZIN 100 MG PO TABS: 100.0000 mg | ORAL_TABLET | Freq: Every day | ORAL | 0 refills | Status: DC

## 2016-08-15 MED ORDER — OXYCODONE-ACETAMINOPHEN 10-325 MG PO TABS: 1.0000 | ORAL_TABLET | Freq: Three times a day (TID) | ORAL | 0 refills | Status: DC | PRN

## 2016-08-15 NOTE — Progress Notes (Signed)
Patient: Craig York Male    DOB: 1951-05-20   65 y.o.   MRN: [id number] Visit Date: 08/15/2016  Today's Provider: Lelon Huh, MD   Chief Complaint  Patient presents with  . Follow-up  . Diabetes  . Hypertension   Subjective:    HPI   Diabetes Mellitus Type II, Follow-up:   Lab Results  Component Value Date   HGBA1C 8.5 08/15/2016   HGBA1C 10.1 04/12/2016   HGBA1C 12.1 01/06/2016   Last seen for diabetes 4 months ago.  Management since then includes; Improved with increased dose of glipizide, but not controlled. He tolerated samples well, but did not fill prescription due to cost. Pioglitazone was called in to pharmacy instead, but he never filled it.  He reports good compliance with treatment. He has been getting strict with diet and exercising more.  He is having side effects. none Current symptoms include none and have been unchanged. Home blood sugar records: fasting range: no changes  Episodes of hypoglycemia? no   Current Insulin Regimen: n/a Most Recent Eye Exam: n/a Weight trend: stable Prior visit with dietician: no Current diet: well balanced Current exercise: walking  ----------------------------------------------------------------   Hypertension, follow-up:  BP Readings from Last 3 Encounters:  01/06/16 120/60  10/06/15 124/80  06/09/15 120/88    He was last seen for hypertension 4 months ago.  Management since that visit includes; decreased HCTZ to 1/2 tablet qd.He reports good compliance with treatment. He is not having side effects. none He is exercising. He is adherent to low salt diet.   Outside blood pressures are 125/8. He is experiencing none.  Patient denies none.   Cardiovascular risk factors include diabetes mellitus.  Use of agents associated with hypertension: none.   ----------------------------------------------------------------    Allergies  Allergen Reactions  . Simvastatin     Elevated CK  .  Penicillins Rash     Current Outpatient Prescriptions:  .  amLODipine (NORVASC) 10 MG tablet, TAKE ONE TABLET BY MOUTH ONCE DAILY, Disp: 90 tablet, Rfl: 4 .  aspirin 81 MG tablet, Take 1 tablet by mouth daily., Disp: , Rfl:  .  glipiZIDE (GLUCOTROL) 10 MG tablet, TAKE ONE TABLET BY MOUTH ONCE DAILY, Disp: 90 tablet, Rfl: 3 .  hydrochlorothiazide (HYDRODIURIL) 50 MG tablet, Take 0.5 tablets (25 mg total) by mouth daily., Disp: 1 tablet, Rfl: 1 .  ibuprofen (ADVIL,MOTRIN) 800 MG tablet, Take 1 tablet (800 mg total) by mouth every 8 (eight) hours as needed (take with food)., Disp: 20 tablet, Rfl: 0 .  metFORMIN (GLUCOPHAGE) 500 MG tablet, Take 2 tablets (1,000 mg total) by mouth 2 (two) times daily with a meal., Disp: 1 tablet, Rfl: 4 .  oxyCODONE-acetaminophen (PERCOCET) 10-325 MG tablet, Take 1 tablet by mouth every 8 (eight) hours as needed for pain., Disp: 30 tablet, Rfl: 0 .  pioglitazone (ACTOS) 15 MG tablet, Take 1 tablet (15 mg total) by mouth daily., Disp: 30 tablet, Rfl: 5 .  tadalafil (CIALIS) 5 MG tablet, Take 1-2 tablets (5-10 mg total) by mouth as needed., Disp: 30 tablet, Rfl: 1 .  valsartan (DIOVAN) 80 MG tablet, Take 1 tablet (80 mg total) by mouth daily., Disp: 90 tablet, Rfl: 4  Review of Systems  Constitutional: Negative for appetite change, chills and fever.  Respiratory: Negative for chest tightness, shortness of breath and wheezing.   Cardiovascular: Negative for chest pain and palpitations.  Gastrointestinal: Negative for abdominal pain, nausea and vomiting.  Social History  Substance Use Topics  . Smoking status: Never Smoker  . Smokeless tobacco: Former Systems developer    Types: [place] date: 11/27/1968     Comment: used 2 packs per week  . Alcohol use 0.0 oz/week     Comment: occasional, drinks beer    Objective:   BP 124/80 (BP Location: Left Arm, Patient Position: Sitting, Cuff Size: Large)   Pulse 67   Temp 98.1 F (36.7 C) (Oral)   Resp 16   Wt 205 lb (93  kg)   SpO2 99%   BMI 27.05 kg/m   Physical Exam  General Appearance:    Alert, cooperative, no distress  Eyes:    PERRL, conjunctiva/corneas clear, EOM's intact       Lungs:     Clear to auscultation bilaterally, respirations unlabored  Heart:    Regular rate and rhythm  Neurologic:   Awake, alert, oriented x 3. No apparent focal neurological           defect.       Results for orders placed or performed in visit on 08/15/16  POCT glycosylated hemoglobin (Hb A1C)  Result Value Ref Range   Hemoglobin A1C 8.5    Est. average glucose Bld gHb Est-mCnc 197         Assessment & Plan:     1. Type 2 diabetes mellitus without complication, without long-term current use of insulin (HCC) Tolerated jardiance well. Discusses cardiac benefits of SGLT-1 agents. Given samples of Invokana 100mg  #40 and advised to call for more refills if tolerating. He anticipates starting on Medicare in December, we should be able to maintain supply of samples until then.  - POCT glycosylated hemoglobin (Hb A1C)  2. Lumbar radiculopathy Printed rf oxycodone and is ready for him to pick up.  - oxyCODONE-acetaminophen (PERCOCET) 10-325 MG tablet; Take 1 tablet by mouth every 8 (eight) hours as needed for pain.  Dispense: 30 tablet; Refill: 0  3. Coronary artery disease involving native coronary artery of native heart without angina pectoris Asymptomatic. Compliant with medication.  Continue aggressive risk factor modification.         Lelon Huh, MD  [facility]

## 2016-08-15 NOTE — Patient Instructions (Signed)
Start taking Invokana 100mg  tablets, one tablet every morning.  Call for more samples of Invokana 100mg  when you are out.

## 2016-09-04 ENCOUNTER — Telehealth: Payer: Self-pay | Admitting: Family Medicine

## 2016-09-04 DIAGNOSIS — M5416 Radiculopathy, lumbar region: Secondary | ICD-10-CM

## 2016-09-04 MED ORDER — OXYCODONE-ACETAMINOPHEN 10-325 MG PO TABS: 1.0000 | ORAL_TABLET | Freq: Three times a day (TID) | ORAL | 0 refills | Status: DC | PRN

## 2016-09-04 NOTE — Telephone Encounter (Signed)
Pt needs refill on his oxycodone.  Please call when ready   Thanks teri

## 2016-09-22 ENCOUNTER — Other Ambulatory Visit: Payer: Self-pay | Admitting: Family Medicine

## 2016-09-22 DIAGNOSIS — I10 Essential (primary) hypertension: Secondary | ICD-10-CM

## 2016-09-22 NOTE — Telephone Encounter (Signed)
Pt contacted office for refill request on the following medications:  [place].  CB#[phone]/MW  metFORMIN (GLUCOPHAGE) 500 MG tablet  valsartan (DIOVAN) 80 MG tablet  hydrochlorothiazide

## 2016-09-25 MED ORDER — HYDROCHLOROTHIAZIDE 50 MG PO TABS: 25.0000 mg | ORAL_TABLET | Freq: Every day | ORAL | 3 refills | Status: DC

## 2016-09-25 MED ORDER — VALSARTAN 80 MG PO TABS: 80.0000 mg | ORAL_TABLET | Freq: Every day | ORAL | 4 refills | Status: DC

## 2016-09-27 ENCOUNTER — Other Ambulatory Visit: Payer: Self-pay | Admitting: Family Medicine

## 2016-09-27 ENCOUNTER — Other Ambulatory Visit: Payer: Self-pay

## 2016-09-27 DIAGNOSIS — M5416 Radiculopathy, lumbar region: Secondary | ICD-10-CM

## 2016-09-27 DIAGNOSIS — E119 Type 2 diabetes mellitus without complications: Secondary | ICD-10-CM

## 2016-09-27 MED ORDER — CANAGLIFLOZIN 100 MG PO TABS: 100.0000 mg | ORAL_TABLET | Freq: Every day | ORAL | 0 refills | Status: DC

## 2016-09-27 MED ORDER — OXYCODONE-ACETAMINOPHEN 10-325 MG PO TABS: 1.0000 | ORAL_TABLET | Freq: Three times a day (TID) | ORAL | 0 refills | Status: DC | PRN

## 2016-09-27 NOTE — Telephone Encounter (Signed)
Pt contacted office for refill request on the following medications:  CB#[phone]  oxyCODONE-acetaminophen (PERCOCET) 10-325 MG  tablet  Pt is also asking for samples of canagliflozin (INVOKANA) 100 MG TABS tablet/MW

## 2016-09-27 NOTE — Telephone Encounter (Signed)
Please advise samples? 

## 2016-10-26 ENCOUNTER — Other Ambulatory Visit: Payer: Self-pay | Admitting: Family Medicine

## 2016-10-26 DIAGNOSIS — M5416 Radiculopathy, lumbar region: Secondary | ICD-10-CM

## 2016-10-26 NOTE — Telephone Encounter (Signed)
Pt contacted office for refill request on the following medications: ° °oxyCODONE-acetaminophen (PERCOCET) 10-325 MG tablet ° °CB#[phone]/MW °

## 2016-10-27 MED ORDER — OXYCODONE-ACETAMINOPHEN 10-325 MG PO TABS: 1.0000 | ORAL_TABLET | Freq: Three times a day (TID) | ORAL | 0 refills | Status: DC | PRN

## 2016-11-07 ENCOUNTER — Encounter: Payer: Self-pay | Admitting: Family Medicine

## 2016-11-07 ENCOUNTER — Ambulatory Visit (INDEPENDENT_AMBULATORY_CARE_PROVIDER_SITE_OTHER): Payer: Medicare Other | Admitting: Family Medicine

## 2016-11-07 VITALS — BP 128/78 | Temp 98.5°F | Resp 16 | Wt 205.0 lb

## 2016-11-07 DIAGNOSIS — M25552 Pain in left hip: Secondary | ICD-10-CM | POA: Diagnosis not present

## 2016-11-07 DIAGNOSIS — I2510 Atherosclerotic heart disease of native coronary artery without angina pectoris: Secondary | ICD-10-CM | POA: Diagnosis not present

## 2016-11-07 DIAGNOSIS — I10 Essential (primary) hypertension: Secondary | ICD-10-CM

## 2016-11-07 DIAGNOSIS — E119 Type 2 diabetes mellitus without complications: Secondary | ICD-10-CM | POA: Diagnosis not present

## 2016-11-07 LAB — POCT GLYCOSYLATED HEMOGLOBIN (HGB A1C)
Est. average glucose Bld gHb Est-mCnc: 160
Hemoglobin A1C: 7.2

## 2016-11-07 MED ORDER — HYDROCHLOROTHIAZIDE 25 MG PO TABS: 25.0000 mg | ORAL_TABLET | Freq: Every day | ORAL | 3 refills | Status: DC

## 2016-11-07 MED ORDER — METHYLPREDNISOLONE ACETATE 80 MG/ML IJ SUSP
80.0000 mg | Freq: Once | INTRAMUSCULAR | Status: AC
  Administered 2016-11-07: 80 mg via INTRAMUSCULAR

## 2016-11-07 NOTE — Progress Notes (Signed)
Patient: Craig York Male    DOB: May 25, 1951   65 y.o.   MRN: [id number] Visit Date: 11/07/2016  Today's Provider: Lelon Huh, MD   Chief Complaint  Patient presents with  . Diabetes  . Hypertension   Subjective:    HPI  Diabetes Mellitus Type II, Follow-up:   Lab Results  Component Value Date   HGBA1C 7.2 11/07/2016   HGBA1C 8.5 08/15/2016   HGBA1C 10.1 04/12/2016    Last seen for diabetes 3 months ago.  Management since then includes starting Jardiance. However, patient reports that he did not start the Jardiance due to cost, and his medication was changed to Invokana 100mg  daily. He reports good compliance with treatment. He is not having side effects.  Current symptoms include none and have been stable. Home blood sugar records: trend: fluctuating a bit  Episodes of hypoglycemia? no   Current Insulin Regimen: none Most Recent Eye Exam: due Weight trend: stable Prior visit with dietician: no Current diet: well balanced Current exercise: no regular exercise  Pertinent Labs:    Component Value Date/Time   CHOL 168 03/29/2015   TRIG 226 (A) 03/29/2015   HDL 43 03/29/2015   LDLCALC 80 03/29/2015   CREATININE 0.97 05/02/2008 0355    Wt Readings from Last 3 Encounters:  11/07/16 205 lb (93 kg)  08/15/16 205 lb (93 kg)  04/12/16 205 lb (93 kg)      Hypertension, follow-up:  BP Readings from Last 3 Encounters:  11/07/16 128/78  08/15/16 124/80  01/06/16 120/60    He was last seen for hypertension 3 months ago.  BP at that visit was 124/80. Management since that visit includes decreasing HCTZ to 1/2 tablet. Patient was to continue all other meds.  He reports good compliance with treatment. He is not having side effects.  He is not exercising. He is adherent to low salt diet.   Outside blood pressures are checked occasionally. He reports that it averages in the 130s/80s.  Patient denies exertional chest pressure/discomfort,  irregular heart beat, lower extremity edema, syncope and tachypnea.   Cardiovascular risk factors include diabetes mellitus.   Weight trend: stable Wt Readings from Last 3 Encounters:  11/07/16 205 lb (93 kg)  08/15/16 205 lb (93 kg)  04/12/16 205 lb (93 kg)    Current diet: well balanced   Also complains of left hip pain for a few weeks. No specific injury, but reports that similar symptoms in past responded well to depot-medrol.       Allergies  Allergen Reactions  . Simvastatin     Elevated CK  . Penicillins Rash     Current Outpatient Prescriptions:  .  amLODipine (NORVASC) 10 MG tablet, TAKE ONE TABLET BY MOUTH ONCE DAILY, Disp: 90 tablet, Rfl: 4 .  aspirin 81 MG tablet, Take 1 tablet by mouth daily., Disp: , Rfl:  .  canagliflozin (INVOKANA) 100 MG TABS tablet, Take 1 tablet (100 mg total) by mouth daily before breakfast., Disp: 30 tablet, Rfl: 0 .  glipiZIDE (GLUCOTROL) 10 MG tablet, TAKE ONE TABLET BY MOUTH ONCE DAILY, Disp: 90 tablet, Rfl: 3 .  hydrochlorothiazide (HYDRODIURIL) 50 MG tablet, Take 0.5 tablets (25 mg total) by mouth daily., Disp: 30 tablet, Rfl: 3 .  ibuprofen (ADVIL,MOTRIN) 800 MG tablet, Take 1 tablet (800 mg total) by mouth every 8 (eight) hours as needed (take with food)., Disp: 20 tablet, Rfl: 0 .  metFORMIN (GLUCOPHAGE) 500 MG tablet, Take 2  tablets (1,000 mg total) by mouth 2 (two) times daily with a meal., Disp: 1 tablet, Rfl: 4 .  oxyCODONE-acetaminophen (PERCOCET) 10-325 MG tablet, Take 1 tablet by mouth every 8 (eight) hours as needed for pain., Disp: 30 tablet, Rfl: 0 .  tadalafil (CIALIS) 5 MG tablet, Take 1-2 tablets (5-10 mg total) by mouth as needed., Disp: 30 tablet, Rfl: 1 .  valsartan (DIOVAN) 80 MG tablet, Take 1 tablet (80 mg total) by mouth daily., Disp: 90 tablet, Rfl: 4 .  metFORMIN (GLUCOPHAGE) 500 MG tablet, Take 2 tablets (1,000 mg total) by mouth 2 (two) times daily with a meal. (Patient not taking: Reported on 11/07/2016),  Disp: 180 tablet, Rfl: 4  Review of Systems  Social History  Substance Use Topics  . Smoking status: Never Smoker  . Smokeless tobacco: Former Systems developer    Types: [place] date: 11/27/1968     Comment: used 2 packs per week  . Alcohol use 0.0 oz/week     Comment: occasional, drinks beer    Objective:   BP 128/78 (BP Location: Left Arm, Patient Position: Sitting, Cuff Size: Normal)   Temp 98.5 F (36.9 C)   Resp 16   Wt 205 lb (93 kg)   BMI 27.05 kg/m   Physical Exam   General Appearance:    Alert, cooperative, no distress  Eyes:    PERRL, conjunctiva/corneas clear, EOM's intact       Lungs:     Clear to auscultation bilaterally, respirations unlabored  Heart:    Regular rate and rhythm  Neurologic:   Awake, alert, oriented x 3. No apparent focal neurological           defect.       Tender left buttocks. No SI or trochanter tenderness.    Results for orders placed or performed in visit on 11/07/16  POCT glycosylated hemoglobin (Hb A1C)  Result Value Ref Range   Hemoglobin A1C 7.2    Est. average glucose Bld gHb Est-mCnc 160        Assessment & Plan:     1. Essential (primary) hypertension Well controlled.  Continue current medications.   - hydrochlorothiazide (HYDRODIURIL) 25 MG tablet; Take 1 tablet (25 mg total) by mouth daily.  Dispense: 90 tablet; Refill: 3  2. Type 2 diabetes mellitus without complication, without long-term current use of insulin (HCC) Doing well since initiation of Invokana - POCT glycosylated hemoglobin (Hb A1C) - Renal function panel  3. Left hip pain  - methylPREDNISolone acetate (DEPO-MEDROL) injection 80 mg; Inject 1 mL (80 mg total) into the muscle once.  Return in about 3 months (around 02/05/2017).  The entirety of the information documented in the History of Present Illness, Review of Systems and Physical Exam were personally obtained by me. Portions of this information were initially documented by Wilburt Finlay, CMA and  reviewed by me for thoroughness and accuracy.        Lelon Huh, MD  [facility]

## 2016-11-08 ENCOUNTER — Telehealth: Payer: Self-pay

## 2016-11-08 LAB — RENAL FUNCTION PANEL
ALBUMIN: 4.9 g/dL — AB (ref 3.6–4.8)
BUN/Creatinine Ratio: 15 (ref 10–24)
BUN: 15 mg/dL (ref 8–27)
CALCIUM: 9.9 mg/dL (ref 8.6–10.2)
CO2: 22 mmol/L (ref 18–29)
CREATININE: 1.03 mg/dL (ref 0.76–1.27)
Chloride: 98 mmol/L (ref 96–106)
GFR calc Af Amer: 88 mL/min/{1.73_m2} (ref >59)
GFR calc non Af Amer: 76 mL/min/{1.73_m2} (ref >59)
Glucose: 93 mg/dL (ref 65–99)
PHOSPHORUS: 3.2 mg/dL (ref 2.5–4.5)
POTASSIUM: 4 mmol/L (ref 3.5–5.2)
SODIUM: 140 mmol/L (ref 134–144)

## 2016-11-08 NOTE — Telephone Encounter (Signed)
-----   Message from [name], MD sent at 11/08/2016  8:00 AM EST ----- Kidney functions and electrolytes are normal. Invokana is working well Continue taking 100mg  samples. Will try to get prescription through after the new year. Follow up o.v. In March as scheduled.

## 2016-11-08 NOTE — Telephone Encounter (Signed)
Left message to call back  

## 2016-11-13 NOTE — Telephone Encounter (Signed)
Patient advised as below. Patient verbalizes understanding and is in agreement with treatment plan.  

## 2016-11-23 ENCOUNTER — Other Ambulatory Visit: Payer: Self-pay | Admitting: Family Medicine

## 2016-11-23 DIAGNOSIS — M5416 Radiculopathy, lumbar region: Secondary | ICD-10-CM

## 2016-11-23 DIAGNOSIS — I10 Essential (primary) hypertension: Secondary | ICD-10-CM

## 2016-11-23 MED ORDER — OXYCODONE-ACETAMINOPHEN 10-325 MG PO TABS: 1.0000 | ORAL_TABLET | Freq: Three times a day (TID) | ORAL | 0 refills | Status: DC | PRN

## 2016-11-23 MED ORDER — HYDROCHLOROTHIAZIDE 25 MG PO TABS: 25.0000 mg | ORAL_TABLET | Freq: Every day | ORAL | 12 refills | Status: DC

## 2016-11-23 NOTE — Telephone Encounter (Signed)
Pt needs refill  hydrochlorothiazide (HYDRODIURIL) 25 MG tablet  oxyCODONE-acetaminophen (PERCOCET) 10-325 MG tablet  He said you write both and he will pick them up.  ThanksTeri

## 2016-11-23 NOTE — Telephone Encounter (Signed)
Last ov 11/07/16. Oxycodone-acetaminophen last filled 10/27/16. Please review. HCTZ last filled on 11/07/16. Thank you. sd

## 2016-11-30 ENCOUNTER — Other Ambulatory Visit: Payer: Self-pay | Admitting: Family Medicine

## 2016-11-30 DIAGNOSIS — E119 Type 2 diabetes mellitus without complications: Secondary | ICD-10-CM

## 2016-11-30 MED ORDER — CANAGLIFLOZIN 100 MG PO TABS
100.0000 mg | ORAL_TABLET | Freq: Every day | ORAL | 2 refills | Status: DC
Start: 2016-11-30 — End: 2017-02-07

## 2016-11-30 NOTE — Progress Notes (Signed)
New rx sent for Invokana to check on formulary coverage.

## 2016-12-28 ENCOUNTER — Other Ambulatory Visit: Payer: Self-pay | Admitting: Family Medicine

## 2016-12-28 DIAGNOSIS — M5416 Radiculopathy, lumbar region: Secondary | ICD-10-CM

## 2016-12-28 MED ORDER — OXYCODONE-ACETAMINOPHEN 10-325 MG PO TABS: 1.0000 | ORAL_TABLET | Freq: Three times a day (TID) | ORAL | 0 refills | Status: DC | PRN

## 2016-12-28 NOTE — Telephone Encounter (Signed)
Pt contacted office for refill request on the following medications: ° °oxyCODONE-acetaminophen (PERCOCET) 10-325 MG tablet ° °CB#[phone]/MW °

## 2016-12-28 NOTE — Telephone Encounter (Signed)
Please review. Thanks!  

## 2017-01-22 ENCOUNTER — Other Ambulatory Visit: Payer: Self-pay

## 2017-01-22 DIAGNOSIS — M5416 Radiculopathy, lumbar region: Secondary | ICD-10-CM

## 2017-01-22 NOTE — Telephone Encounter (Signed)
Pt called requesting refills of ibuprofen (ADVIL,MOTRIN) 800 MG tablet and oxyCODONE-acetaminophen (PERCOCET) 10-325 MG tablet. Last refill of opxy was 12/28/2016, and LOV was 11/07/2016. Has appointment scheduled for 02/07/2017. Renaldo Fiddler, CMA

## 2017-01-23 MED ORDER — IBUPROFEN 800 MG PO TABS: 800.0000 mg | ORAL_TABLET | Freq: Three times a day (TID) | ORAL | 3 refills | Status: DC | PRN

## 2017-01-23 MED ORDER — OXYCODONE-ACETAMINOPHEN 10-325 MG PO TABS: 1.0000 | ORAL_TABLET | Freq: Three times a day (TID) | ORAL | 0 refills | Status: DC | PRN

## 2017-02-07 ENCOUNTER — Ambulatory Visit (INDEPENDENT_AMBULATORY_CARE_PROVIDER_SITE_OTHER): Payer: Medicare HMO | Admitting: Family Medicine

## 2017-02-07 ENCOUNTER — Encounter: Payer: Self-pay | Admitting: Family Medicine

## 2017-02-07 VITALS — BP 100/70 | HR 76 | Temp 98.1°F | Resp 16 | Ht 73.0 in | Wt 204.0 lb

## 2017-02-07 DIAGNOSIS — E119 Type 2 diabetes mellitus without complications: Secondary | ICD-10-CM

## 2017-02-07 DIAGNOSIS — N529 Male erectile dysfunction, unspecified: Secondary | ICD-10-CM

## 2017-02-07 DIAGNOSIS — I10 Essential (primary) hypertension: Secondary | ICD-10-CM

## 2017-02-07 DIAGNOSIS — M79604 Pain in right leg: Secondary | ICD-10-CM

## 2017-02-07 DIAGNOSIS — I2510 Atherosclerotic heart disease of native coronary artery without angina pectoris: Secondary | ICD-10-CM

## 2017-02-07 LAB — POCT GLYCOSYLATED HEMOGLOBIN (HGB A1C)
Est. average glucose Bld gHb Est-mCnc: 189
Hemoglobin A1C: 8.2

## 2017-02-07 MED ORDER — CANAGLIFLOZIN 300 MG PO TABS: 300.0000 mg | ORAL_TABLET | Freq: Every day | ORAL | 0 refills | Status: DC

## 2017-02-07 MED ORDER — AMLODIPINE BESYLATE 10 MG PO TABS: 10.0000 mg | ORAL_TABLET | Freq: Every day | ORAL | 4 refills | Status: DC

## 2017-02-07 MED ORDER — NAPROXEN 500 MG PO TABS: 500.0000 mg | ORAL_TABLET | Freq: Two times a day (BID) | ORAL | 0 refills | Status: DC

## 2017-02-07 NOTE — Progress Notes (Signed)
Patient: Craig York Male    DOB: [birth year]   66 y.o.   MRN: [id number] Visit Date: 02/07/2017  Today's Provider: Lelon Huh, MD   Chief Complaint  Patient presents with  . Erectile Dysfunction  . Diabetes  . Hypertension   Subjective:    HPI   Diabetes Mellitus Type II, Follow-up:   Lab Results  Component Value Date   HGBA1C 7.2 11/07/2016   HGBA1C 8.5 08/15/2016   HGBA1C 10.1 04/12/2016   Last seen for diabetes 3 months ago.  Management since then includes; no changes. He reports good compliance with treatment. He is having side effects. none Current symptoms include none and have been unchanged. Home blood sugar records: fasting range: normal  Episodes of hypoglycemia? no   Current Insulin Regimen: n/a Most Recent Eye Exam: due Weight trend: stable Prior visit with dietician: no Current diet: well balanced Current exercise: walking  ----------------------------------------------------------------   Hypertension, follow-up:  BP Readings from Last 3 Encounters:  02/07/17 100/70  11/07/16 128/78  08/15/16 124/80    He was last seen for hypertension 3 months ago.  BP at that visit was 128/78. Management since that visit includes; no changes.He reports good compliance with treatment. He is not having side effects. none He is exercising. He is adherent to low salt diet.   Outside blood pressures are normal. He is experiencing none.  Patient denies none.   Cardiovascular risk factors include diabetes mellitus.  Use of agents associated with hypertension: none.   ----------------------------------------------------------------  Follow up ED He has done well with  Cialis and Viagra samples. He requests refills today.        Allergies  Allergen Reactions  . Simvastatin     Elevated CK  . Penicillins Rash     Current Outpatient Prescriptions:  .  amLODipine (NORVASC) 10 MG tablet, Take 1 tablet (10 mg total) by mouth daily.,  Disp: 90 tablet, Rfl: 4 .  aspirin 81 MG tablet, Take 1 tablet by mouth daily., Disp: , Rfl:  .  canagliflozin (INVOKANA) 300 MG TABS tablet, Take 1 tablet (300 mg total) by mouth daily before breakfast., Disp: 60 tablet, Rfl: 0 .  glipiZIDE (GLUCOTROL) 10 MG tablet, TAKE ONE TABLET BY MOUTH ONCE DAILY, Disp: 90 tablet, Rfl: 3 .  ibuprofen (ADVIL,MOTRIN) 800 MG tablet, Take 1 tablet (800 mg total) by mouth every 8 (eight) hours as needed (take with food)., Disp: 20 tablet, Rfl: 3 .  metFORMIN (GLUCOPHAGE) 500 MG tablet, Take 2 tablets (1,000 mg total) by mouth 2 (two) times daily with a meal., Disp: 180 tablet, Rfl: 4 .  oxyCODONE-acetaminophen (PERCOCET) 10-325 MG tablet, Take 1 tablet by mouth every 8 (eight) hours as needed for pain., Disp: 30 tablet, Rfl: 0 .  tadalafil (CIALIS) 5 MG tablet, Take 1-2 tablets (5-10 mg total) by mouth as needed., Disp: 30 tablet, Rfl: 1 .  valsartan (DIOVAN) 80 MG tablet, Take 1 tablet (80 mg total) by mouth daily., Disp: 90 tablet, Rfl: 4 .  naproxen (NAPROSYN) 500 MG tablet, Take 1 tablet (500 mg total) by mouth 2 (two) times daily with a meal., Disp: 30 tablet, Rfl: 0  Review of Systems  Constitutional: Negative for appetite change, chills and fever.  Respiratory: Negative for chest tightness, shortness of breath and wheezing.   Cardiovascular: Negative for chest pain and palpitations.  Gastrointestinal: Negative for abdominal pain, nausea and vomiting.    Social History  Substance Use Topics  .  Smoking status: Never Smoker  . Smokeless tobacco: Former Systems developer    Types: [place] date: 11/27/1968     Comment: used 2 packs per week  . Alcohol use 0.0 oz/week     Comment: occasional, drinks beer    Objective:   BP 100/70 (BP Location: Right Arm, Patient Position: Sitting, Cuff Size: Large)   Pulse 76   Temp 98.1 F (36.7 C) (Oral)   Resp 16   Ht 6\' 1"  (1.854 m)   Wt 204 lb (92.5 kg)   SpO2 98%   BMI 26.91 kg/m    Fall Risk  02/07/2017  Falls  in the past year? No   Depression screen [facility] 2/9 02/07/2017 10/06/2015  Decreased Interest 0 0  Down, Depressed, Hopeless 0 0  PHQ - 2 Score 0 0  Altered sleeping 0 0  Tired, decreased energy 0 0  Change in appetite 0 0  Feeling bad or failure about yourself  0 0  Trouble concentrating 0 0  Moving slowly or fidgety/restless 0 0  Suicidal thoughts 0 0  PHQ-9 Score 0 0  Difficult doing work/chores Not difficult at all Not difficult at all     Vitals:   02/07/17 1126  BP: 100/70  Pulse: 76  Resp: 16  Temp: 98.1 F (36.7 C)  TempSrc: Oral  SpO2: 98%  Weight: 204 lb (92.5 kg)  Height: 6\' 1"  (1.854 m)     Physical Exam   General Appearance:    Alert, cooperative, no distress  Eyes:    PERRL, conjunctiva/corneas clear, EOM's intact       Lungs:     Clear to auscultation bilaterally, respirations unlabored  Heart:    Regular rate and rhythm  Neurologic:   Awake, alert, oriented x 3. No apparent focal neurological           defect.        Results for orders placed or performed in visit on 02/07/17  POCT glycosylated hemoglobin (Hb A1C)  Result Value Ref Range   Hemoglobin A1C 8.2    Est. average glucose Bld gHb Est-mCnc 189        Assessment & Plan:     1. Type 2 diabetes mellitus without complication, without long-term current use of insulin (HCC) A1c up today, will increase Invokana to 300, given samples.  - POCT glycosylated hemoglobin (Hb A1C) - canagliflozin (INVOKANA) 300 MG TABS tablet; Take 1 tablet (300 mg total) by mouth daily before breakfast.  Dispense: 60 tablet; Refill: 0  2. Right leg pain  - naproxen (NAPROSYN) 500 MG tablet; Take 1 tablet (500 mg total) by mouth 2 (two) times daily with a meal.  Dispense: 30 tablet; Refill: 0  3. Coronary artery disease involving native coronary artery of native heart without angina pectoris Asymptomatic. Compliant with medication.  Continue aggressive risk factor modification.  Did not tolerate simvastatin in the  past.   4. Essential (primary) hypertension Well controlled. Will d/c hctz since BP is low and we are increasing Invokana.   5. ED Samples given of Viagra 50mg   2 packets.   Return in about 8 weeks (around 04/04/2017).       Lelon Huh, MD  [facility]

## 2017-02-07 NOTE — Patient Instructions (Signed)
   You can stop taking hydrochlorothiazide since  Your blood pressure is getting low.

## 2017-02-19 ENCOUNTER — Other Ambulatory Visit: Payer: Self-pay | Admitting: Family Medicine

## 2017-02-19 DIAGNOSIS — M5416 Radiculopathy, lumbar region: Secondary | ICD-10-CM

## 2017-02-19 NOTE — Telephone Encounter (Signed)
Pt contacted office for refill request on the following medications: oxyCODONE-acetaminophen (PERCOCET) 10-325 MG tablet  Last Rx: 01/23/17 Please advise. Thanks TNP

## 2017-02-20 ENCOUNTER — Other Ambulatory Visit: Payer: Self-pay | Admitting: *Deleted

## 2017-02-20 MED ORDER — GLIPIZIDE 10 MG PO TABS: 10.0000 mg | ORAL_TABLET | Freq: Every day | ORAL | 3 refills | Status: DC

## 2017-02-20 MED ORDER — OXYCODONE-ACETAMINOPHEN 10-325 MG PO TABS: 1.0000 | ORAL_TABLET | Freq: Three times a day (TID) | ORAL | 0 refills | Status: DC | PRN

## 2017-03-20 ENCOUNTER — Other Ambulatory Visit: Payer: Self-pay | Admitting: Family Medicine

## 2017-03-20 DIAGNOSIS — M5416 Radiculopathy, lumbar region: Secondary | ICD-10-CM

## 2017-03-20 MED ORDER — OXYCODONE-ACETAMINOPHEN 10-325 MG PO TABS
1.0000 | ORAL_TABLET | Freq: Three times a day (TID) | ORAL | 0 refills | Status: DC | PRN
Start: 2017-03-20 — End: 2017-04-19

## 2017-03-20 NOTE — Telephone Encounter (Signed)
Refill oxyCODONE-acetaminophen (PERCOCET) 10-325 MG tablet  Thanks teri

## 2017-04-12 ENCOUNTER — Telehealth: Payer: Self-pay | Admitting: Family Medicine

## 2017-04-12 ENCOUNTER — Encounter: Payer: Self-pay | Admitting: Family Medicine

## 2017-04-12 ENCOUNTER — Ambulatory Visit (INDEPENDENT_AMBULATORY_CARE_PROVIDER_SITE_OTHER): Payer: Medicare HMO | Admitting: Family Medicine

## 2017-04-12 VITALS — BP 120/72 | HR 67 | Temp 98.0°F | Resp 16 | Wt 206.0 lb

## 2017-04-12 DIAGNOSIS — D508 Other iron deficiency anemias: Secondary | ICD-10-CM | POA: Diagnosis not present

## 2017-04-12 DIAGNOSIS — R0609 Other forms of dyspnea: Secondary | ICD-10-CM

## 2017-04-12 DIAGNOSIS — R718 Other abnormality of red blood cells: Secondary | ICD-10-CM | POA: Diagnosis not present

## 2017-04-12 DIAGNOSIS — I2510 Atherosclerotic heart disease of native coronary artery without angina pectoris: Secondary | ICD-10-CM | POA: Diagnosis not present

## 2017-04-12 DIAGNOSIS — E119 Type 2 diabetes mellitus without complications: Secondary | ICD-10-CM

## 2017-04-12 DIAGNOSIS — D649 Anemia, unspecified: Secondary | ICD-10-CM | POA: Diagnosis not present

## 2017-04-12 DIAGNOSIS — N529 Male erectile dysfunction, unspecified: Secondary | ICD-10-CM

## 2017-04-12 LAB — POCT GLYCOSYLATED HEMOGLOBIN (HGB A1C)
ESTIMATED AVERAGE GLUCOSE: 166
HEMOGLOBIN A1C: 7.4

## 2017-04-12 MED ORDER — SILDENAFIL CITRATE 20 MG PO TABS: ORAL_TABLET | ORAL | 3 refills | Status: DC

## 2017-04-12 MED ORDER — CANAGLIFLOZIN 300 MG PO TABS: 300.0000 mg | ORAL_TABLET | Freq: Every day | ORAL | 5 refills | Status: DC

## 2017-04-12 NOTE — Telephone Encounter (Signed)
Please advise 

## 2017-04-12 NOTE — Progress Notes (Signed)
Patient: Craig York Male    DOB: 09/07/1951   66 y.o.   MRN: [id number] Visit Date: 04/12/2017  Today's Provider: Lelon Huh, MD   Chief Complaint  Patient presents with  . Hypertension    follow up   . Diabetes    follow up    Subjective:    HPI  Hypertension, follow-up:  BP Readings from Last 3 Encounters:  02/07/17 100/70  11/07/16 128/78  08/15/16 124/80    He was last seen for hypertension 2 months ago.  BP at that visit was 100/70. Management since that visit includes discontinuing HCTZ due to low blood pressure. He reports good compliance with treatment. He is not having side effects.  He is exercising. He is not adherent to low salt diet.   Outside blood pressures are 127/78. He is experiencing dyspnea, fatigue and palpitations.  Patient denies chest pain, chest pressure/discomfort, claudication, exertional chest pressure/discomfort, irregular heart beat, lower extremity edema, near-syncope, orthopnea, paroxysmal nocturnal dyspnea, syncope and tachypnea.   Cardiovascular risk factors include advanced age (older than 71 for men, 85 for women), diabetes mellitus, hypertension and male gender.  Use of agents associated with hypertension: NSAIDS.     Weight trend: fluctuating a bit Wt Readings from Last 3 Encounters:  02/07/17 204 lb (92.5 kg)  11/07/16 205 lb (93 kg)  08/15/16 205 lb (93 kg)    Current diet: well balanced  ------------------------------------------------------------------------  Diabetes Mellitus Type II, Follow-up:   Lab Results  Component Value Date   HGBA1C 8.2 02/07/2017   HGBA1C 7.2 11/07/2016   HGBA1C 8.5 08/15/2016    Last seen for diabetes 2 months ago.  Management since then includes increasing Invokana to 300mg  (samples given). He reports good compliance with treatment. He is not having side effects.  Current symptoms include polydipsia and have been stable. Home blood sugar records: fasting range: 75;  blood sugars are rarely checked per patient  Episodes of hypoglycemia? no   Current Insulin Regimen: none Most Recent Eye Exam: 1 year ago Weight trend: fluctuating a bit Prior visit with dietician: no Current diet: well balanced Current exercise: walking  Pertinent Labs:    Component Value Date/Time   CHOL 168 03/29/2015   TRIG 226 (A) 03/29/2015   HDL 43 03/29/2015   LDLCALC 80 03/29/2015   CREATININE 1.03 11/07/2016 1213    Wt Readings from Last 3 Encounters:  02/07/17 204 lb (92.5 kg)  11/07/16 205 lb (93 kg)  08/15/16 205 lb (93 kg)    ------------------------------------------------------------------------ He states he has been getting short of breath when mowing the lawn the last several weeks. Sometimes has a mild pressure in his chest with exertion, but no pain.     Allergies  Allergen Reactions  . Simvastatin     Elevated CK  . Penicillins Rash     Current Outpatient Prescriptions:  .  amLODipine (NORVASC) 10 MG tablet, Take 1 tablet (10 mg total) by mouth daily., Disp: 90 tablet, Rfl: 4 .  aspirin 81 MG tablet, Take 1 tablet by mouth daily., Disp: , Rfl:  .  canagliflozin (INVOKANA) 300 MG TABS tablet, Take 1 tablet (300 mg total) by mouth daily before breakfast., Disp: 60 tablet, Rfl: 0 .  glipiZIDE (GLUCOTROL) 10 MG tablet, Take 1 tablet (10 mg total) by mouth daily., Disp: 90 tablet, Rfl: 3 .  ibuprofen (ADVIL,MOTRIN) 800 MG tablet, Take 1 tablet (800 mg total) by mouth every 8 (eight) hours as needed (  take with food)., Disp: 20 tablet, Rfl: 3 .  metFORMIN (GLUCOPHAGE) 500 MG tablet, Take 2 tablets (1,000 mg total) by mouth 2 (two) times daily with a meal., Disp: 180 tablet, Rfl: 4 .  naproxen (NAPROSYN) 500 MG tablet, Take 1 tablet (500 mg total) by mouth 2 (two) times daily with a meal., Disp: 30 tablet, Rfl: 0 .  oxyCODONE-acetaminophen (PERCOCET) 10-325 MG tablet, Take 1 tablet by mouth every 8 (eight) hours as needed for pain., Disp: 30 tablet, Rfl:  0 .  tadalafil (CIALIS) 5 MG tablet, Take 1-2 tablets (5-10 mg total) by mouth as needed., Disp: 30 tablet, Rfl: 1 .  valsartan (DIOVAN) 80 MG tablet, Take 1 tablet (80 mg total) by mouth daily., Disp: 90 tablet, Rfl: 4  Review of Systems  Constitutional: Negative for appetite change, chills and fever.  Respiratory: Positive for shortness of breath. Negative for chest tightness and wheezing.   Cardiovascular: Positive for palpitations. Negative for chest pain.  Gastrointestinal: Negative for abdominal pain, nausea and vomiting.   Patient Active Problem List   Diagnosis Date Noted  . Fracture of metatarsal bone, right sequela 06/04/2015  . H/O acute myocardial infarction 06/04/2015  . Lumbar radiculopathy 06/04/2015  . Sciatica 02/22/2009  . Diabetes mellitus, type 2 ([place]) 01/07/2009  . Lumbosacral spondylosis 12/06/2008  . ED (erectile dysfunction) of organic origin 10/18/2008  . Coronary artery disease 10/16/2008  . Hemorrhage of gastrointestinal tract 07/23/2008  . HLD (hyperlipidemia) 07/23/2008  . Essential (primary) hypertension 07/23/2008   Past Medical History:  Diagnosis Date  . Diabetes mellitus without complication ([place])   . Hyperlipidemia   . Myocardial infarction ([place])    No past surgical history on file.  Social History  Substance Use Topics  . Smoking status: Never Smoker  . Smokeless tobacco: Former Systems developer    Types: [place] date: 11/27/1968     Comment: used 2 packs per week  . Alcohol use 0.0 oz/week     Comment: occasional, drinks beer    Objective:   BP 120/72 (BP Location: Left Arm, Patient Position: Sitting, Cuff Size: Large)   Pulse 67   Temp 98 F (36.7 C) (Oral)   Resp 16   Wt 206 lb (93.4 kg)   SpO2 98% Comment: room air  BMI 27.18 kg/m  There were no vitals filed for this visit.   Physical Exam    General Appearance:    Alert, cooperative, no distress  Eyes:    PERRL, conjunctiva/corneas clear, EOM's intact       Lungs:     Clear  to auscultation bilaterally, respirations unlabored  Heart:    Regular rate and rhythm  Neurologic:   Awake, alert, oriented x 3. No apparent focal neurological           defect.         Results for orders placed or performed in visit on 04/12/17  POCT HgB A1C  Result Value Ref Range   Hemoglobin A1C 7.4    Est. average glucose Bld gHb Est-mCnc 166        Assessment & Plan:     1. Type 2 diabetes mellitus without complication, without long-term current use of insulin (HCC) Improving on Invokana. Continue current medications.   - POCT HgB A1C - Renal function panel - canagliflozin (INVOKANA) 300 MG TABS tablet; Take 1 tablet (300 mg total) by mouth daily before breakfast.  Dispense: 30 tablet; Refill: 5  2. Coronary artery disease involving  native coronary artery of native heart without angina pectoris Has been several years since cardiology follow up, now with mild DOE and chest pressure with exertion. Check labs and get back into cardiologist. Continue ECASA - EKG 12-Lead - Ambulatory referral to Cardiology - Lipid panel  3. Dyspnea on exertion  - CBC - Brain natriuretic peptide  4. ED (erectile dysfunction) of organic origin He requests samples of ED medications which we do not have.  - sildenafil (REVATIO) 20 MG tablet; 3-5 tablets as needed prior to intercourse, not to exceed 5 tablets in a day  Dispense: 50 tablet; Refill: 3       Lelon Huh, MD  [facility]

## 2017-04-12 NOTE — Telephone Encounter (Signed)
Pt states the Rx for canagliflozin (INVOKANA) 300 MG TABS tablet is going to be $250.00.  Pt states he can not afford this Rx and is requesting something different.  [place]  CB#[phone]/MW

## 2017-04-13 LAB — LIPID PANEL
CHOLESTEROL TOTAL: 167 mg/dL (ref 100–199)
Chol/HDL Ratio: 3.6 ratio (ref 0.0–5.0)
HDL: 46 mg/dL (ref >39)
LDL CALC: 105 mg/dL — AB (ref 0–99)
TRIGLYCERIDES: 81 mg/dL (ref 0–149)
VLDL CHOLESTEROL CAL: 16 mg/dL (ref 5–40)

## 2017-04-13 LAB — CBC
HEMOGLOBIN: 9 g/dL — AB (ref 13.0–17.7)
Hematocrit: 29.7 % — ABNORMAL LOW (ref 37.5–51.0)
MCH: 22.8 pg — ABNORMAL LOW (ref 26.6–33.0)
MCHC: 30.3 g/dL — AB (ref 31.5–35.7)
MCV: 75 fL — ABNORMAL LOW (ref 79–97)
Platelets: 265 10*3/uL (ref 150–379)
RBC: 3.95 x10E6/uL — AB (ref 4.14–5.80)
RDW: 16.1 % — ABNORMAL HIGH (ref 12.3–15.4)
WBC: 5.8 10*3/uL (ref 3.4–10.8)

## 2017-04-13 LAB — RENAL FUNCTION PANEL
ALBUMIN: 4.7 g/dL (ref 3.6–4.8)
BUN / CREAT RATIO: 15 (ref 10–24)
BUN: 13 mg/dL (ref 8–27)
CHLORIDE: 102 mmol/L (ref 96–106)
CO2: 21 mmol/L (ref 18–29)
Calcium: 9.6 mg/dL (ref 8.6–10.2)
Creatinine, Ser: 0.88 mg/dL (ref 0.76–1.27)
GFR calc non Af Amer: 90 mL/min/{1.73_m2} (ref >59)
GFR, EST AFRICAN AMERICAN: 104 mL/min/{1.73_m2} (ref >59)
GLUCOSE: 79 mg/dL (ref 65–99)
Phosphorus: 3.5 mg/dL (ref 2.5–4.5)
Potassium: 4 mmol/L (ref 3.5–5.2)
SODIUM: 140 mmol/L (ref 134–144)

## 2017-04-13 LAB — BRAIN NATRIURETIC PEPTIDE: BNP: <2.5 pg/mL (ref 0.0–100.0)

## 2017-04-13 MED ORDER — EMPAGLIFLOZIN 25 MG PO TABS: 25.0000 mg | ORAL_TABLET | Freq: Every day | ORAL | 3 refills | Status: DC

## 2017-04-13 NOTE — Telephone Encounter (Signed)
Please call in Jardiance 25mg  once a day, #30, rf x 3. Diagnosis diabetes and coronary artery disease and see if this Is covered better.

## 2017-04-13 NOTE — Telephone Encounter (Signed)
Patient advised and RX sent in-aa 

## 2017-04-14 LAB — B12 AND FOLATE PANEL
FOLATE: 19.8 ng/mL (ref >3.0)
Vitamin B-12: 660 pg/mL (ref 232–1245)

## 2017-04-14 LAB — FERRITIN: FERRITIN: 8 ng/mL — AB (ref 30–400)

## 2017-04-14 LAB — IRON AND TIBC
IRON: 17 ug/dL — AB (ref 38–169)
Iron Saturation: 3 % — CL (ref 15–55)
Total Iron Binding Capacity: 503 ug/dL — ABNORMAL HIGH (ref 250–450)
UIBC: 486 ug/dL — ABNORMAL HIGH (ref 111–343)

## 2017-04-14 LAB — SPECIMEN STATUS REPORT

## 2017-04-16 ENCOUNTER — Telehealth: Payer: Self-pay

## 2017-04-16 NOTE — Telephone Encounter (Signed)
Called pt on phone and LMTCB. Tried calling work number in chart, and it was disconnected. Renaldo Fiddler, CMA

## 2017-04-16 NOTE — Telephone Encounter (Signed)
-----   Message from [name], MD sent at 04/14/2017 10:43 AM EDT ----- Patient is VERY iron deficient. . Need to start iron sulfate 325 BID. Also need to do OC-Lyte test to see if losing blood in stool

## 2017-04-17 NOTE — Telephone Encounter (Signed)
Patient advised as below.  

## 2017-04-19 ENCOUNTER — Encounter: Payer: Self-pay | Admitting: Family Medicine

## 2017-04-19 ENCOUNTER — Ambulatory Visit (INDEPENDENT_AMBULATORY_CARE_PROVIDER_SITE_OTHER): Payer: Medicare HMO | Admitting: Family Medicine

## 2017-04-19 VITALS — BP 122/64 | HR 69 | Temp 98.0°F | Resp 16 | Wt 204.0 lb

## 2017-04-19 DIAGNOSIS — R195 Other fecal abnormalities: Secondary | ICD-10-CM

## 2017-04-19 DIAGNOSIS — M5416 Radiculopathy, lumbar region: Secondary | ICD-10-CM

## 2017-04-19 DIAGNOSIS — D509 Iron deficiency anemia, unspecified: Secondary | ICD-10-CM | POA: Diagnosis not present

## 2017-04-19 LAB — IFOBT (OCCULT BLOOD): IFOBT: POSITIVE

## 2017-04-19 MED ORDER — OXYCODONE-ACETAMINOPHEN 10-325 MG PO TABS: 1.0000 | ORAL_TABLET | Freq: Three times a day (TID) | ORAL | 0 refills | Status: DC | PRN

## 2017-04-19 NOTE — Progress Notes (Signed)
Patient: Craig York Male    DOB: [birth year]   66 y.o.   MRN: [id number] Visit Date: 04/19/2017  Today's Provider: Lelon Huh, MD   Chief Complaint  Patient presents with  . Anemia   Subjective:    HPI Iron Deficiency Anemia:  Patient recently had labs done 1 week ago which showed iron deficiency anemia. Patient was advised to start Iron sulfate 325mg  twice daily. He was also advised that we should check an OC light test. Patient requested that we do this in the office and is here today to have this checked.  Lab Results  Component Value Date   WBC 5.8 04/12/2017   HGB 10.5 (L) 05/02/2008   HCT 29.7 (L) 04/12/2017   MCV 75 (L) 04/12/2017   PLT 265 04/12/2017   Lab Results  Component Value Date   FERRITIN 8 (L) 04/12/2017   He states he does donate blood about every six months, the last was 1-2 months ago. He feels short of breath with exertion. His last colonoscopy was with Dr. Dionne Milo in 2013 revealing only internal hemorrhoids. He has not noticed any change in stool lately. Has no abdominal pain. No change in appetite    Allergies  Allergen Reactions  . Simvastatin     Elevated CK  . Penicillins Rash     Current Outpatient Prescriptions:  .  amLODipine (NORVASC) 10 MG tablet, Take 1 tablet (10 mg total) by mouth daily., Disp: 90 tablet, Rfl: 4 .  aspirin 81 MG tablet, Take 1 tablet by mouth daily., Disp: , Rfl:  .  empagliflozin (JARDIANCE) 25 MG TABS tablet, Take 25 mg by mouth daily., Disp: 30 tablet, Rfl: 3 .  ferrous sulfate 325 (65 FE) MG tablet, Take 325 mg by mouth 2 (two) times daily., Disp: , Rfl:  .  glipiZIDE (GLUCOTROL) 10 MG tablet, Take 1 tablet (10 mg total) by mouth daily., Disp: 90 tablet, Rfl: 3 .  ibuprofen (ADVIL,MOTRIN) 800 MG tablet, Take 1 tablet (800 mg total) by mouth every 8 (eight) hours as needed (take with food)., Disp: 20 tablet, Rfl: 3 .  metFORMIN (GLUCOPHAGE) 500 MG tablet, Take 2 tablets (1,000 mg total) by mouth 2  (two) times daily with a meal., Disp: 180 tablet, Rfl: 4 .  naproxen (NAPROSYN) 500 MG tablet, Take 1 tablet (500 mg total) by mouth 2 (two) times daily with a meal., Disp: 30 tablet, Rfl: 0 .  oxyCODONE-acetaminophen (PERCOCET) 10-325 MG tablet, Take 1 tablet by mouth every 8 (eight) hours as needed for pain., Disp: 30 tablet, Rfl: 0 .  sildenafil (REVATIO) 20 MG tablet, 3-5 tablets as needed prior to intercourse, not to exceed 5 tablets in a day, Disp: 50 tablet, Rfl: 3 .  tadalafil (CIALIS) 5 MG tablet, Take 1-2 tablets (5-10 mg total) by mouth as needed., Disp: 30 tablet, Rfl: 1 .  valsartan (DIOVAN) 80 MG tablet, Take 1 tablet (80 mg total) by mouth daily., Disp: 90 tablet, Rfl: 4  Review of Systems  Constitutional: Negative for appetite change, chills and fever.  Respiratory: Negative for chest tightness, shortness of breath and wheezing.   Cardiovascular: Negative for chest pain and palpitations.  Gastrointestinal: Negative for abdominal pain, nausea and vomiting.    Social History  Substance Use Topics  . Smoking status: Never Smoker  . Smokeless tobacco: Former Systems developer    Types: [place] date: 11/27/1968     Comment: used 2 packs per week  .  Alcohol use 0.0 oz/week     Comment: occasional, drinks beer    Objective:   BP 122/64 (BP Location: Left Arm, Patient Position: Sitting, Cuff Size: Normal)   Pulse 69   Temp 98 F (36.7 C) (Oral)   Resp 16   Wt 204 lb (92.5 kg)   SpO2 98% Comment: room air  BMI 26.91 kg/m  There were no vitals filed for this visit.   Physical Exam  General Appearance:    Alert, cooperative, no distress  Eyes:    PERRL, conjunctiva/corneas clear, EOM's intact       Lungs:     Clear to auscultation bilaterally, respirations unlabored  Heart:    Regular rate and rhythm  Abdomen:   bowel sounds present and normal in all 4 quadrants, soft, round, nontender or nondistended. No CVA tenderness    Results for orders placed or performed in visit on  04/19/17  IFOBT POC (occult bld, rslt in office)  Result Value Ref Range   IFOBT Positive         Assessment & Plan:     1. Positive fecal occult blood test   2. Iron deficiency anemia, unspecified iron deficiency anemia type  - Ambulatory referral to Gastroenterology - IFOBT POC (occult bld, rslt in office)  3. Lumbar radiculopathy Doing well with occasional oxycodone/apap which is refilled today.  - oxyCODONE-acetaminophen (PERCOCET) 10-325 MG tablet; Take 1 tablet by mouth every 8 (eight) hours as needed for pain.  Dispense: 30 tablet; Refill: 0     The entirety of the information documented in the History of Present Illness, Review of Systems and Physical Exam were personally obtained by me. Portions of this information were initially documented by Meyer Cory, CMA and reviewed by me for thoroughness and accuracy.    Lelon Huh, MD  [facility]

## 2017-05-21 ENCOUNTER — Other Ambulatory Visit: Payer: Self-pay

## 2017-05-21 DIAGNOSIS — M5416 Radiculopathy, lumbar region: Secondary | ICD-10-CM

## 2017-05-21 MED ORDER — OXYCODONE-ACETAMINOPHEN 10-325 MG PO TABS: 1.0000 | ORAL_TABLET | Freq: Three times a day (TID) | ORAL | 0 refills | Status: DC | PRN

## 2017-05-21 NOTE — Telephone Encounter (Signed)
Patient is requesting a refill on Glipizide 10mg  and Oxycodone 10/325mg . He is aware that Dr. Caryn Section is out of the office. Patient uses [facility] on [address]. Thanks!

## 2017-05-21 NOTE — Telephone Encounter (Signed)
Glipizide 10 mg rx sent in 02/20/17 #90 x3 refills. [facility] confirmed receipt.

## 2017-05-21 NOTE — Telephone Encounter (Signed)
Refill request for Oxycodone-Ace 10-325 mg q8hrs prn Last filled by MD on- 04/19/2017 #30 x0 refills Last Appt: 04/19/2017 Next Appt: 08/10/2017 Please advise refill?

## 2017-05-21 NOTE — Telephone Encounter (Signed)
Refills the Oxycodone/APAP 10/325 mg q 8 hrs prn #30 once a month. Will print refill prescription for pick up at the front desk as he is on schedule. Further refills must come from Dr. Caryn Section.

## 2017-05-28 ENCOUNTER — Ambulatory Visit (INDEPENDENT_AMBULATORY_CARE_PROVIDER_SITE_OTHER): Payer: Medicare HMO | Admitting: Family Medicine

## 2017-05-28 ENCOUNTER — Encounter: Payer: Self-pay | Admitting: Family Medicine

## 2017-05-28 VITALS — BP 142/76 | HR 98 | Temp 97.8°F | Resp 16 | Wt 202.0 lb

## 2017-05-28 DIAGNOSIS — Z23 Encounter for immunization: Secondary | ICD-10-CM

## 2017-05-28 DIAGNOSIS — L89322 Pressure ulcer of left buttock, stage 2: Secondary | ICD-10-CM

## 2017-05-28 DIAGNOSIS — L03317 Cellulitis of buttock: Secondary | ICD-10-CM | POA: Diagnosis not present

## 2017-05-28 DIAGNOSIS — L0231 Cutaneous abscess of buttock: Secondary | ICD-10-CM | POA: Diagnosis not present

## 2017-05-28 DIAGNOSIS — T148XXA Other injury of unspecified body region, initial encounter: Secondary | ICD-10-CM

## 2017-05-28 MED ORDER — SILVER SULFADIAZINE 1 % EX CREA: 1.0000 "application " | TOPICAL_CREAM | Freq: Every day | CUTANEOUS | 0 refills | Status: DC

## 2017-05-28 MED ORDER — CEFDINIR 300 MG PO CAPS: 300.0000 mg | ORAL_CAPSULE | Freq: Two times a day (BID) | ORAL | 0 refills | Status: AC

## 2017-05-28 NOTE — Progress Notes (Signed)
Patient: Craig York Male    DOB: 1950/12/21   66 y.o.   MRN: [id number] Visit Date: 05/28/2017  Today's Provider: Lelon Huh, MD   Chief Complaint  Patient presents with  . Recurrent Skin Infections   Subjective:    HPI Pt is here today for a possible abscess on his buttocks. He reports that it is located on his left butt cheek. He says it is painful and sometimes hard to sit down. His wife has been using a "old remedy" that 'draws out the blood/infection" but pt does not know what was in it. He has been unable to get anything out of the area other than blood. This started about 5-6 days ago and has not gotten any better. Pt reports that this has been making him feel bad and tired.      Allergies  Allergen Reactions  . Simvastatin     Elevated CK  . Penicillins Rash     Current Outpatient Prescriptions:  .  amLODipine (NORVASC) 10 MG tablet, Take 1 tablet (10 mg total) by mouth daily., Disp: 90 tablet, Rfl: 4 .  aspirin 81 MG tablet, Take 1 tablet by mouth daily., Disp: , Rfl:  .  empagliflozin (JARDIANCE) 25 MG TABS tablet, Take 25 mg by mouth daily., Disp: 30 tablet, Rfl: 3 .  ferrous sulfate 325 (65 FE) MG tablet, Take 325 mg by mouth 2 (two) times daily., Disp: , Rfl:  .  glipiZIDE (GLUCOTROL) 10 MG tablet, Take 1 tablet (10 mg total) by mouth daily., Disp: 90 tablet, Rfl: 3 .  ibuprofen (ADVIL,MOTRIN) 800 MG tablet, Take 1 tablet (800 mg total) by mouth every 8 (eight) hours as needed (take with food)., Disp: 20 tablet, Rfl: 3 .  metFORMIN (GLUCOPHAGE) 500 MG tablet, Take 2 tablets (1,000 mg total) by mouth 2 (two) times daily with a meal., Disp: 180 tablet, Rfl: 4 .  naproxen (NAPROSYN) 500 MG tablet, Take 1 tablet (500 mg total) by mouth 2 (two) times daily with a meal., Disp: 30 tablet, Rfl: 0 .  oxyCODONE-acetaminophen (PERCOCET) 10-325 MG tablet, Take 1 tablet by mouth every 8 (eight) hours as needed for pain., Disp: 30 tablet, Rfl: 0 .  sildenafil  (REVATIO) 20 MG tablet, 3-5 tablets as needed prior to intercourse, not to exceed 5 tablets in a day, Disp: 50 tablet, Rfl: 3 .  tadalafil (CIALIS) 5 MG tablet, Take 1-2 tablets (5-10 mg total) by mouth as needed., Disp: 30 tablet, Rfl: 1 .  valsartan (DIOVAN) 80 MG tablet, Take 1 tablet (80 mg total) by mouth daily., Disp: 90 tablet, Rfl: 4  Review of Systems  Constitutional: Positive for fatigue.  HENT: Negative.   Eyes: Negative.   Respiratory: Negative.   Cardiovascular: Negative.   Gastrointestinal: Negative.   Endocrine: Negative.   Genitourinary: Negative.   Musculoskeletal: Negative.   Skin: Positive for wound.  Allergic/Immunologic: Negative.   Neurological: Negative.   Hematological: Negative.   Psychiatric/Behavioral: Negative.     Social History  Substance Use Topics  . Smoking status: Never Smoker  . Smokeless tobacco: Former Systems developer    Types: [place] date: 11/27/1968     Comment: used 2 packs per week  . Alcohol use 0.0 oz/week     Comment: occasional, drinks beer    Objective:   BP (!) 142/76 (BP Location: Right Arm, Patient Position: Sitting, Cuff Size: Normal)   Pulse 98   Temp 97.8 F (36.6  C) (Oral)   Resp 16   Wt 202 lb (91.6 kg)   SpO2 98%   BMI 26.65 kg/m  Vitals:   05/28/17 1443  BP: (!) 142/76  Pulse: 98  Resp: 16  Temp: 97.8 F (36.6 C)  TempSrc: Oral  SpO2: 98%  Weight: 202 lb (91.6 kg)     Physical Exam  General appearance: alert, well developed, well nourished, cooperative and in no distress Skin: 4x4cm erosion left hip with 3-4 cm surrounding erythema. Tender to touch periphery. Small amount of yellow drainage.      Assessment & Plan:     1. Cellulitis and abscess of buttock  - cefdinir (OMNICEF) 300 MG capsule; Take 1 capsule (300 mg total) by mouth 2 (two) times daily.  Dispense: 14 capsule; Refill: 0 - silver sulfADIAZINE (SILVADENE) 1 % cream; Apply 1 application topically daily.  Dispense: 50 g; Refill: 0  2.  Abrasion Tdap  3. Decubitus ulcer of left buttock, stage 2 Counseled to keep weight off of posterior hip as much as possible       Lelon Huh, MD  [place]

## 2017-06-01 ENCOUNTER — Ambulatory Visit (INDEPENDENT_AMBULATORY_CARE_PROVIDER_SITE_OTHER): Payer: Medicare HMO | Admitting: Family Medicine

## 2017-06-01 ENCOUNTER — Encounter: Payer: Self-pay | Admitting: Family Medicine

## 2017-06-01 VITALS — BP 120/70 | HR 68 | Temp 97.7°F | Resp 16 | Wt 202.0 lb

## 2017-06-01 DIAGNOSIS — L03317 Cellulitis of buttock: Secondary | ICD-10-CM

## 2017-06-01 DIAGNOSIS — L0231 Cutaneous abscess of buttock: Secondary | ICD-10-CM

## 2017-06-01 NOTE — Progress Notes (Signed)
Patient: Craig York Male    DOB: 12/27/1950   66 y.o.   MRN: [id number] Visit Date: 06/01/2017  Today's Provider: Lelon Huh, MD   Chief Complaint  Patient presents with  . Cellulitis    follow up   Subjective:    HPI Follow up:  Patient was last seen in the office 3 days ago for Cellulitis, abscess of left buttock, decubitus ulcer of the left buttock and abrasion. Management during that visit includes updating Tdap vaccine. Patient was also started on Cefdinir and Silvadene cream. Patient was counseled to keep weight off of posterior hip as much as possible and follow up in 3 days. Today patient comes in reporting good compliance with treatment, good tolerance and good symptom control.     Allergies  Allergen Reactions  . Simvastatin     Elevated CK  . Penicillins Rash     Current Outpatient Prescriptions:  .  amLODipine (NORVASC) 10 MG tablet, Take 1 tablet (10 mg total) by mouth daily., Disp: 90 tablet, Rfl: 4 .  aspirin 81 MG tablet, Take 1 tablet by mouth daily., Disp: , Rfl:  .  cefdinir (OMNICEF) 300 MG capsule, Take 1 capsule (300 mg total) by mouth 2 (two) times daily., Disp: 14 capsule, Rfl: 0 .  empagliflozin (JARDIANCE) 25 MG TABS tablet, Take 25 mg by mouth daily., Disp: 30 tablet, Rfl: 3 .  ferrous sulfate 325 (65 FE) MG tablet, Take 325 mg by mouth 2 (two) times daily., Disp: , Rfl:  .  glipiZIDE (GLUCOTROL) 10 MG tablet, Take 1 tablet (10 mg total) by mouth daily., Disp: 90 tablet, Rfl: 3 .  ibuprofen (ADVIL,MOTRIN) 800 MG tablet, Take 1 tablet (800 mg total) by mouth every 8 (eight) hours as needed (take with food)., Disp: 20 tablet, Rfl: 3 .  metFORMIN (GLUCOPHAGE) 500 MG tablet, Take 2 tablets (1,000 mg total) by mouth 2 (two) times daily with a meal., Disp: 180 tablet, Rfl: 4 .  naproxen (NAPROSYN) 500 MG tablet, Take 1 tablet (500 mg total) by mouth 2 (two) times daily with a meal., Disp: 30 tablet, Rfl: 0 .  oxyCODONE-acetaminophen (PERCOCET)  10-325 MG tablet, Take 1 tablet by mouth every 8 (eight) hours as needed for pain., Disp: 30 tablet, Rfl: 0 .  sildenafil (REVATIO) 20 MG tablet, 3-5 tablets as needed prior to intercourse, not to exceed 5 tablets in a day, Disp: 50 tablet, Rfl: 3 .  silver sulfADIAZINE (SILVADENE) 1 % cream, Apply 1 application topically daily., Disp: 50 g, Rfl: 0 .  tadalafil (CIALIS) 5 MG tablet, Take 1-2 tablets (5-10 mg total) by mouth as needed., Disp: 30 tablet, Rfl: 1 .  valsartan (DIOVAN) 80 MG tablet, Take 1 tablet (80 mg total) by mouth daily., Disp: 90 tablet, Rfl: 4  Review of Systems  Constitutional: Negative for appetite change, chills and fever.  Respiratory: Negative for chest tightness, shortness of breath and wheezing.   Cardiovascular: Negative for chest pain and palpitations.  Gastrointestinal: Negative for abdominal pain, nausea and vomiting.  Skin: Positive for wound.    Social History  Substance Use Topics  . Smoking status: Former Research scientist (life sciences)  . Smokeless tobacco: Former Systems developer    Types: [place] date: 11/27/1968     Comment: used 2 packs per week  . Alcohol use 0.0 oz/week     Comment: occasional, drinks beer    Objective:   BP 120/70 (BP Location: Left Arm, Cuff Size:  Normal)   Pulse 68   Temp 97.7 F (36.5 C) (Oral)   Resp 16   Wt 202 lb (91.6 kg)   SpO2 99% Comment: room air  BMI 26.65 kg/m  Vitals:   06/01/17 1046 06/01/17 1050  BP: 122/70 120/70  Pulse: 68   Resp: 16   Temp: 97.7 F (36.5 C)   TempSrc: Oral   SpO2: 99%   Weight: 202 lb (91.6 kg)      Physical Exam  .Erythema resolved. Continues to have open wound of buttocks. No discharge.     Assessment & Plan:     1. Cellulitis and abscess of buttock Improving. Infection appears to have resolved. Is to finish antibiotic Continue applying silvadene dressing to wound daily and recheck in 7-10 days.        Lelon Huh, MD  [facility]

## 2017-06-04 ENCOUNTER — Ambulatory Visit: Payer: Self-pay | Admitting: Cardiovascular Disease

## 2017-06-05 ENCOUNTER — Encounter: Payer: Self-pay | Admitting: Gastroenterology

## 2017-06-05 ENCOUNTER — Ambulatory Visit (INDEPENDENT_AMBULATORY_CARE_PROVIDER_SITE_OTHER): Payer: Medicare HMO | Admitting: Gastroenterology

## 2017-06-05 ENCOUNTER — Other Ambulatory Visit: Payer: Self-pay

## 2017-06-05 VITALS — BP 141/72 | HR 74 | Temp 97.4°F | Ht 73.0 in | Wt 200.2 lb

## 2017-06-05 DIAGNOSIS — D509 Iron deficiency anemia, unspecified: Secondary | ICD-10-CM | POA: Diagnosis not present

## 2017-06-05 NOTE — Progress Notes (Signed)
Gastroenterology Consultation  Referring Provider:     [name], MD Primary Care Physician:  Craig Sons, MD Primary Gastroenterologist:  Craig York     Reason for Consultation:     Iron deficiency anemia        HPI:   Craig York is a 66 y.o. y/o male referred for consultation & management of Iron deficiency anemia by Dr. Caryn York, Craig Peri, MD.  This patient comes in today after being found to have anemia.  The patient has a low iron saturation and a hemoglobin of 9. The patient appears to have had a colonoscopy apparently 5 years ago that showed internal hemorrhoids. The patient was also found to have heme positive stools. The patient's iron was 17 with his saturation of 3 and a ferritin that was low at 8.  The patient does state that he has some blood with his bowel movements when he eats certain foods.  If any black stools or unexplained weight loss.  The patient also denies any abdominal pain.  There is no family history of colon cancer colon polyps.  Past Medical History:  Diagnosis Date  . Diabetes mellitus without complication ([place])   . Hyperlipidemia   . Myocardial infarction [facility])     History reviewed. No pertinent surgical history.  Prior to Admission medications   Medication Sig Start Date End Date Taking? Authorizing Provider  amLODipine (NORVASC) 10 MG tablet Take 1 tablet (10 mg total) by mouth daily. 02/07/17  Yes Craig Sons, MD  aspirin 81 MG tablet Take 1 tablet by mouth daily. 07/23/08  Yes [provider]  empagliflozin (JARDIANCE) 25 MG TABS tablet Take 25 mg by mouth daily. 04/13/17  Yes Craig Sons, MD  ferrous sulfate 325 (65 FE) MG tablet Take 325 mg by mouth 2 (two) times daily.   Yes [provider]  glipiZIDE (GLUCOTROL) 10 MG tablet Take 1 tablet (10 mg total) by mouth daily. 02/20/17  Yes Craig Sons, MD  ibuprofen (ADVIL,MOTRIN) 800 MG tablet Take 1 tablet (800 mg total) by mouth every 8 (eight) hours as  needed (take with food). 01/23/17  Yes Craig Sons, MD  metFORMIN (GLUCOPHAGE) 500 MG tablet Take 2 tablets (1,000 mg total) by mouth 2 (two) times daily with a meal. 09/25/16  Yes Craig Sons, MD  naproxen (NAPROSYN) 500 MG tablet Take 1 tablet (500 mg total) by mouth 2 (two) times daily with a meal. 02/07/17  Yes Craig Sons, MD  oxyCODONE-acetaminophen (PERCOCET) 10-325 MG tablet Take 1 tablet by mouth every 8 (eight) hours as needed for pain. 05/21/17  Yes Chrismon, Vickki Muff, PA  sildenafil (REVATIO) 20 MG tablet 3-5 tablets as needed prior to intercourse, not to exceed 5 tablets in a day 04/12/17  Yes Fisher, Craig Peri, MD  silver sulfADIAZINE (SILVADENE) 1 % cream Apply 1 application topically daily. 05/28/17  Yes Craig Sons, MD  tadalafil (CIALIS) 5 MG tablet Take 1-2 tablets (5-10 mg total) by mouth as needed. 10/06/15  Yes Craig Sons, MD  valsartan (DIOVAN) 80 MG tablet Take 1 tablet (80 mg total) by mouth daily. 09/25/16  Yes Craig Sons, MD    Family History  Problem Relation Age of Onset  . Hypertension Mother      Social History  Substance Use Topics  . Smoking status: Former Research scientist (life sciences)  . Smokeless tobacco: Former Systems developer    Types: Chew    Quit date: 11/27/1968  Comment: used 2 packs per week  . Alcohol use 0.0 oz/week     Comment: occasional, drinks beer     Allergies as of 06/05/2017 - Review Complete 06/05/2017  Allergen Reaction Noted  . Simvastatin  06/04/2015  . Penicillins Rash 06/04/2015    Review of Systems:    All systems reviewed and negative except where noted in HPI.   Physical Exam:  BP (!) 141/72   Pulse 74   Temp (!) 97.4 F (36.3 C) (Oral)   Ht 6\' 1"  (1.854 m)   Wt 200 lb 3.2 oz (90.8 kg)   BMI 26.41 kg/m  No LMP for male patient. Psych:  Alert and cooperative. Normal mood and affect. General:   Alert,  Well-developed, well-nourished, pleasant and cooperative in NAD Head:  Normocephalic and atraumatic. Eyes:  Sclera  clear, no icterus.   Conjunctiva pink. Ears:  Normal auditory acuity. Nose:  No deformity, discharge, or lesions. Mouth:  No deformity or lesions,oropharynx pink & moist. Neck:  Supple; no masses or thyromegaly. Lungs:  Respirations even and unlabored.  Clear throughout to auscultation.   No wheezes, crackles, or rhonchi. No acute distress. Heart:  Regular rate and rhythm; no murmurs, clicks, rubs, or gallops. Abdomen:  Normal bowel sounds.  No bruits.  Soft, non-tender and non-distended without masses, hepatosplenomegaly or hernias noted.  No guarding or rebound tenderness.  Negative Carnett sign.   Rectal:  Deferred.  Msk:  Symmetrical without gross deformities.  Good, equal movement & strength bilaterally. Pulses:  Normal pulses noted. Extremities:  No clubbing or edema.  No cyanosis. Neurologic:  Alert and oriented x3;  grossly normal neurologically. Skin:  Intact without significant lesions or rashes.  No jaundice. Lymph Nodes:  No significant cervical adenopathy. Psych:  Alert and cooperative. Normal mood and affect.  Imaging Studies: No results found.  Assessment and Plan:   Craig York is a 66 y.o. y/o male who was found to have a low ferritin with iron deficiency anemia and heme positive stools.  The patient will be set up for an EGD and colonoscopy. The patient denies any overt sign of where he may be losing blood from. I have discussed risks & benefits which include, but are not limited to, bleeding, infection, perforation & drug reaction.  The patient agrees with this plan & written consent will be obtained.     Craig Lame, MD. [facility]   Note: This dictation was prepared with Dragon dictation along with smaller phrase technology. Any transcriptional errors that result from this process are unintentional.

## 2017-06-07 ENCOUNTER — Ambulatory Visit (INDEPENDENT_AMBULATORY_CARE_PROVIDER_SITE_OTHER): Payer: Medicare HMO | Admitting: Family Medicine

## 2017-06-07 ENCOUNTER — Encounter: Payer: Self-pay | Admitting: Family Medicine

## 2017-06-07 VITALS — BP 130/72 | HR 72 | Temp 97.9°F | Resp 16 | Wt 202.0 lb

## 2017-06-07 DIAGNOSIS — L89322 Pressure ulcer of left buttock, stage 2: Secondary | ICD-10-CM

## 2017-06-07 DIAGNOSIS — L03317 Cellulitis of buttock: Secondary | ICD-10-CM | POA: Diagnosis not present

## 2017-06-07 DIAGNOSIS — L0231 Cutaneous abscess of buttock: Secondary | ICD-10-CM

## 2017-06-07 MED ORDER — SILVER SULFADIAZINE 1 % EX CREA: 1.0000 "application " | TOPICAL_CREAM | Freq: Every day | CUTANEOUS | 0 refills | Status: DC

## 2017-06-07 NOTE — Progress Notes (Signed)
Patient: Craig York Male    DOB: 26-Jul-1951   66 y.o.   MRN: [id number] Visit Date: 06/07/2017  Today's Provider: Lelon Huh, MD   Chief Complaint  Patient presents with  . celluitis    Subjective:    HPI Patient comes in today for a 1 week follow up of cellulitis. On last visit, patient reports that it has improved. He has also completed all of the doxycycline.[name] He denies any fever. He still has some soreness, but he reports that it is slowly continuing to heal. Has been applying silvadene daily.Is no longer painful at all.     Allergies  Allergen Reactions  . Simvastatin     Elevated CK  . Penicillins Rash     Current Outpatient Prescriptions:  .  amLODipine (NORVASC) 10 MG tablet, Take 1 tablet (10 mg total) by mouth daily., Disp: 90 tablet, Rfl: 4 .  aspirin 81 MG tablet, Take 1 tablet by mouth daily., Disp: , Rfl:  .  empagliflozin (JARDIANCE) 25 MG TABS tablet, Take 25 mg by mouth daily., Disp: 30 tablet, Rfl: 3 .  ferrous sulfate 325 (65 FE) MG tablet, Take 325 mg by mouth 2 (two) times daily., Disp: , Rfl:  .  glipiZIDE (GLUCOTROL) 10 MG tablet, Take 1 tablet (10 mg total) by mouth daily., Disp: 90 tablet, Rfl: 3 .  ibuprofen (ADVIL,MOTRIN) 800 MG tablet, Take 1 tablet (800 mg total) by mouth every 8 (eight) hours as needed (take with food)., Disp: 20 tablet, Rfl: 3 .  metFORMIN (GLUCOPHAGE) 500 MG tablet, Take 2 tablets (1,000 mg total) by mouth 2 (two) times daily with a meal., Disp: 180 tablet, Rfl: 4 .  naproxen (NAPROSYN) 500 MG tablet, Take 1 tablet (500 mg total) by mouth 2 (two) times daily with a meal., Disp: 30 tablet, Rfl: 0 .  oxyCODONE-acetaminophen (PERCOCET) 10-325 MG tablet, Take 1 tablet by mouth every 8 (eight) hours as needed for pain., Disp: 30 tablet, Rfl: 0 .  sildenafil (REVATIO) 20 MG tablet, 3-5 tablets as needed prior to intercourse, not to exceed 5 tablets in a day, Disp: 50 tablet, Rfl: 3 .  silver sulfADIAZINE (SILVADENE) 1 %  cream, Apply 1 application topically daily., Disp: 50 g, Rfl: 0 .  tadalafil (CIALIS) 5 MG tablet, Take 1-2 tablets (5-10 mg total) by mouth as needed., Disp: 30 tablet, Rfl: 1 .  valsartan (DIOVAN) 80 MG tablet, Take 1 tablet (80 mg total) by mouth daily., Disp: 90 tablet, Rfl: 4  Review of Systems  Constitutional: Negative.   Respiratory: Negative.   Skin: Positive for color change and wound.    Social History  Substance Use Topics  . Smoking status: Former Research scientist (life sciences)  . Smokeless tobacco: Former Systems developer    Types: [place] date: 11/27/1968     Comment: used 2 packs per week  . Alcohol use 0.0 oz/week     Comment: occasional, drinks beer    Objective:   BP 130/72 (BP Location: Right Arm, Patient Position: Sitting, Cuff Size: Normal)   Pulse 72   Temp 97.9 F (36.6 C)   Resp 16   Wt 202 lb (91.6 kg)   BMI 26.65 kg/m  Vitals:   06/07/17 1044  BP: 130/72  Pulse: 72  Resp: 16  Temp: 97.9 F (36.6 C)  Weight: 202 lb (91.6 kg)     Physical Exam   Deep pressure ulcer of left buttocks with small amount of  discharge. No surrounding erythema.     Assessment & Plan:     1. Cellulitis and abscess of buttock Has completed doxycycline with no sign of persistent infection.  - silver sulfADIAZINE (SILVADENE) 1 % cream; Apply 1 application topically daily.  Dispense: 400 g; Refill: 0  2. Decubitus ulcer of left buttock, stage 2 Consider refer to wound clinic       Lelon Huh, MD  [facility]

## 2017-06-10 ENCOUNTER — Telehealth: Payer: Self-pay | Admitting: Family Medicine

## 2017-06-10 NOTE — Telephone Encounter (Signed)
Please advise patient that we are setting up a referral to wound care clinic to get the sore on his hip to heal better. Judson Roch will be in touch with him in the next couple of days about referral.,

## 2017-06-11 NOTE — Telephone Encounter (Signed)
Advised patient as below.  

## 2017-06-12 ENCOUNTER — Ambulatory Visit: Payer: Self-pay | Admitting: Cardiovascular Disease

## 2017-06-21 ENCOUNTER — Telehealth: Payer: Self-pay | Admitting: Family Medicine

## 2017-06-21 DIAGNOSIS — M5416 Radiculopathy, lumbar region: Secondary | ICD-10-CM

## 2017-06-21 MED ORDER — OXYCODONE-ACETAMINOPHEN 10-325 MG PO TABS: 1.0000 | ORAL_TABLET | Freq: Three times a day (TID) | ORAL | 0 refills | Status: DC | PRN

## 2017-06-21 NOTE — Telephone Encounter (Signed)
Last OV 06/07/2017 and last RF was 05/21/2017

## 2017-06-21 NOTE — Telephone Encounter (Signed)
Pt contacted office for refill request on the following medications: ° °oxyCODONE-acetaminophen (PERCOCET) 10-325 MG tablet ° °CB#[phone]/MW °

## 2017-06-25 ENCOUNTER — Ambulatory Visit: Payer: Medicare HMO | Admitting: Surgery

## 2017-07-05 ENCOUNTER — Ambulatory Visit (INDEPENDENT_AMBULATORY_CARE_PROVIDER_SITE_OTHER): Payer: Medicare HMO | Admitting: Family Medicine

## 2017-07-05 ENCOUNTER — Encounter: Payer: Self-pay | Admitting: Family Medicine

## 2017-07-05 VITALS — BP 118/60 | HR 74 | Temp 98.0°F | Resp 16 | Wt 204.0 lb

## 2017-07-05 DIAGNOSIS — E119 Type 2 diabetes mellitus without complications: Secondary | ICD-10-CM | POA: Diagnosis not present

## 2017-07-05 DIAGNOSIS — L89322 Pressure ulcer of left buttock, stage 2: Secondary | ICD-10-CM

## 2017-07-05 DIAGNOSIS — D509 Iron deficiency anemia, unspecified: Secondary | ICD-10-CM | POA: Diagnosis not present

## 2017-07-05 MED ORDER — EMPAGLIFLOZIN 25 MG PO TABS: 25.0000 mg | ORAL_TABLET | Freq: Every day | ORAL | 0 refills | Status: DC

## 2017-07-05 NOTE — Progress Notes (Signed)
Patient: Craig York Male    DOB: Nov 21, 1951   66 y.o.   MRN: [id number] Visit Date: 07/05/2017  Today's Provider: Lelon Huh, MD   Chief Complaint  Patient presents with  . Follow-up  . Cellulitis   Subjective:    HPI    Decubitus ulcer of left buttock, stage 2 From 06/07/2017-referred to wound clinic.Patient stated that he did not go to wound clinic. He has been using the SILVADENE cream which has helped wound heal.    Follow up diabeties: Lab Results  Component Value Date   HGBA1C 7.4 04/12/2017   Is doing well since adding Jardiance, although has been too expensive to fill prescription. Has been taking samples without adverse effect.  BMET    Component Value Date/Time   NA 140 04/12/2017 1155   K 4.0 04/12/2017 1155   CL 102 04/12/2017 1155   CO2 21 04/12/2017 1155   GLUCOSE 79 04/12/2017 1155   GLUCOSE 101 (H) 05/02/2008 0355   BUN 13 04/12/2017 1155   CREATININE 0.88 04/12/2017 1155   CALCIUM 9.6 04/12/2017 1155   GFRNONAA 90 04/12/2017 1155   GFRAA 104 04/12/2017 1155   Follow up iron deficient anemia Lab Results  Component Value Date   FERRITIN 8 (L) 04/12/2017  Has been on daily iron supplement since May. Is scheduled for colonoscopy next week with Dr. Allen Norris.   Allergies  Allergen Reactions  . Simvastatin     Elevated CK  . Penicillins Rash     Current Outpatient Prescriptions:  .  amLODipine (NORVASC) 10 MG tablet, Take 1 tablet (10 mg total) by mouth daily., Disp: 90 tablet, Rfl: 4 .  aspirin 81 MG tablet, Take 1 tablet by mouth daily., Disp: , Rfl:  .  empagliflozin (JARDIANCE) 25 MG TABS tablet, Take 25 mg by mouth daily., Disp: 30 tablet, Rfl: 3 .  ferrous sulfate 325 (65 FE) MG tablet, Take 325 mg by mouth 2 (two) times daily., Disp: , Rfl:  .  glipiZIDE (GLUCOTROL) 10 MG tablet, Take 1 tablet (10 mg total) by mouth daily., Disp: 90 tablet, Rfl: 3 .  ibuprofen (ADVIL,MOTRIN) 800 MG tablet, Take 1 tablet (800 mg total) by mouth  every 8 (eight) hours as needed (take with food)., Disp: 20 tablet, Rfl: 3 .  metFORMIN (GLUCOPHAGE) 500 MG tablet, Take 2 tablets (1,000 mg total) by mouth 2 (two) times daily with a meal., Disp: 180 tablet, Rfl: 4 .  naproxen (NAPROSYN) 500 MG tablet, Take 1 tablet (500 mg total) by mouth 2 (two) times daily with a meal., Disp: 30 tablet, Rfl: 0 .  oxyCODONE-acetaminophen (PERCOCET) 10-325 MG tablet, Take 1 tablet by mouth every 8 (eight) hours as needed for pain., Disp: 30 tablet, Rfl: 0 .  sildenafil (REVATIO) 20 MG tablet, 3-5 tablets as needed prior to intercourse, not to exceed 5 tablets in a day, Disp: 50 tablet, Rfl: 3 .  silver sulfADIAZINE (SILVADENE) 1 % cream, Apply 1 application topically daily., Disp: 400 g, Rfl: 0 .  tadalafil (CIALIS) 5 MG tablet, Take 1-2 tablets (5-10 mg total) by mouth as needed., Disp: 30 tablet, Rfl: 1 .  valsartan (DIOVAN) 80 MG tablet, Take 1 tablet (80 mg total) by mouth daily., Disp: 90 tablet, Rfl: 4  Review of Systems  Constitutional: Negative for appetite change, chills and fever.  Respiratory: Negative for chest tightness, shortness of breath and wheezing.   Cardiovascular: Negative for chest pain and palpitations.  Gastrointestinal: Negative  for abdominal pain, nausea and vomiting.    Social History  Substance Use Topics  . Smoking status: Former Research scientist (life sciences)  . Smokeless tobacco: Former Systems developer    Types: [place] date: 11/27/1968     Comment: used 2 packs per week  . Alcohol use 0.0 oz/week     Comment: occasional, drinks beer    Objective:   BP 118/60 (BP Location: Right Arm, Patient Position: Sitting, Cuff Size: Large)   Pulse 74   Temp 98 F (36.7 C) (Oral)   Resp 16   Wt 204 lb (92.5 kg)   SpO2 98%   BMI 26.91 kg/m  Vitals:   07/05/17 1104  BP: 118/60  Pulse: 74  Resp: 16  Temp: 98 F (36.7 C)  TempSrc: Oral  SpO2: 98%  Weight: 204 lb (92.5 kg)     Physical Exam   General Appearance:    Alert, cooperative, no distress    Eyes:    PERRL, conjunctiva/corneas clear, EOM's intact       Lungs:     Clear to auscultation bilaterally, respirations unlabored  Heart:    Regular rate and rhythm  Skin:   Wound on right buttocks greatly improved. No with about 2cm oval area of pink granulation tissue at center No surrounding erythema.          Assessment & Plan:     1. Decubitus ulcer of left buttock, stage 2 Greatly improved, but not yet healed completely. Continue daily silvadene applications and keep wound covered until completely covered with normal dry dkin.   2. Iron deficiency anemia, unspecified iron deficiency anemia type Scheduled for colonoscopy next week.  - Ferritin - CBC  3. Type 2 diabetes mellitus without complication, without long-term current use of insulin ([place]) Doing well with addition of Jardiance.  - Hemoglobin A1c       Lelon Huh, MD  [facility]

## 2017-07-06 LAB — CBC
HEMATOCRIT: 37.8 % (ref 37.5–51.0)
Hemoglobin: 12.3 g/dL — ABNORMAL LOW (ref 13.0–17.7)
MCH: 26.1 pg — ABNORMAL LOW (ref 26.6–33.0)
MCHC: 32.5 g/dL (ref 31.5–35.7)
MCV: 80 fL (ref 79–97)
Platelets: 266 10*3/uL (ref 150–379)
RBC: 4.72 x10E6/uL (ref 4.14–5.80)
RDW: 20.8 % — ABNORMAL HIGH (ref 12.3–15.4)
WBC: 7.6 10*3/uL (ref 3.4–10.8)

## 2017-07-06 LAB — FERRITIN: Ferritin: 49 ng/mL (ref 30–400)

## 2017-07-06 LAB — HEMOGLOBIN A1C
Est. average glucose Bld gHb Est-mCnc: 128 mg/dL
Hgb A1c MFr Bld: 6.1 % — ABNORMAL HIGH (ref 4.8–5.6)

## 2017-07-10 ENCOUNTER — Encounter
Admission: RE | Disposition: A | Discharge status: Home / Self Care | Payer: Self-pay | Source: Ambulatory Visit | Attending: Gastroenterology

## 2017-07-10 ENCOUNTER — Ambulatory Visit
Admission: RE | Admit: 2017-07-10 | Discharge: 2017-07-10 | Disposition: A | Discharge status: Home / Self Care | Payer: Medicare HMO | Source: Ambulatory Visit | Attending: Gastroenterology | Admitting: Gastroenterology

## 2017-07-10 ENCOUNTER — Ambulatory Visit: Payer: Medicare HMO | Admitting: Anesthesiology

## 2017-07-10 ENCOUNTER — Encounter: Payer: Self-pay | Admitting: *Deleted

## 2017-07-10 DIAGNOSIS — D649 Anemia, unspecified: Secondary | ICD-10-CM | POA: Insufficient documentation

## 2017-07-10 DIAGNOSIS — K317 Polyp of stomach and duodenum: Secondary | ICD-10-CM

## 2017-07-10 DIAGNOSIS — I252 Old myocardial infarction: Secondary | ICD-10-CM | POA: Diagnosis not present

## 2017-07-10 DIAGNOSIS — E785 Hyperlipidemia, unspecified: Secondary | ICD-10-CM | POA: Diagnosis not present

## 2017-07-10 DIAGNOSIS — Z7984 Long term (current) use of oral hypoglycemic drugs: Secondary | ICD-10-CM | POA: Insufficient documentation

## 2017-07-10 DIAGNOSIS — Z87891 Personal history of nicotine dependence: Secondary | ICD-10-CM | POA: Insufficient documentation

## 2017-07-10 DIAGNOSIS — D125 Benign neoplasm of sigmoid colon: Secondary | ICD-10-CM | POA: Diagnosis not present

## 2017-07-10 DIAGNOSIS — Z8619 Personal history of other infectious and parasitic diseases: Secondary | ICD-10-CM | POA: Diagnosis not present

## 2017-07-10 DIAGNOSIS — E119 Type 2 diabetes mellitus without complications: Secondary | ICD-10-CM | POA: Diagnosis not present

## 2017-07-10 DIAGNOSIS — K2950 Unspecified chronic gastritis without bleeding: Secondary | ICD-10-CM | POA: Insufficient documentation

## 2017-07-10 DIAGNOSIS — M1990 Unspecified osteoarthritis, unspecified site: Secondary | ICD-10-CM | POA: Diagnosis not present

## 2017-07-10 DIAGNOSIS — K641 Second degree hemorrhoids: Secondary | ICD-10-CM | POA: Diagnosis not present

## 2017-07-10 DIAGNOSIS — I2510 Atherosclerotic heart disease of native coronary artery without angina pectoris: Secondary | ICD-10-CM | POA: Diagnosis not present

## 2017-07-10 DIAGNOSIS — D124 Benign neoplasm of descending colon: Secondary | ICD-10-CM | POA: Diagnosis not present

## 2017-07-10 DIAGNOSIS — D509 Iron deficiency anemia, unspecified: Secondary | ICD-10-CM

## 2017-07-10 DIAGNOSIS — Z8249 Family history of ischemic heart disease and other diseases of the circulatory system: Secondary | ICD-10-CM | POA: Insufficient documentation

## 2017-07-10 DIAGNOSIS — Z79899 Other long term (current) drug therapy: Secondary | ICD-10-CM | POA: Insufficient documentation

## 2017-07-10 DIAGNOSIS — K228 Other specified diseases of esophagus: Secondary | ICD-10-CM | POA: Diagnosis not present

## 2017-07-10 DIAGNOSIS — Z880 Allergy status to penicillin: Secondary | ICD-10-CM | POA: Diagnosis not present

## 2017-07-10 DIAGNOSIS — Z7982 Long term (current) use of aspirin: Secondary | ICD-10-CM | POA: Diagnosis not present

## 2017-07-10 DIAGNOSIS — I10 Essential (primary) hypertension: Secondary | ICD-10-CM | POA: Diagnosis not present

## 2017-07-10 DIAGNOSIS — Z888 Allergy status to other drugs, medicaments and biological substances status: Secondary | ICD-10-CM | POA: Insufficient documentation

## 2017-07-10 DIAGNOSIS — K635 Polyp of colon: Secondary | ICD-10-CM

## 2017-07-10 HISTORY — PX: ESOPHAGOGASTRODUODENOSCOPY (EGD) WITH PROPOFOL: SHX5813

## 2017-07-10 HISTORY — PX: COLONOSCOPY WITH PROPOFOL: SHX5780

## 2017-07-10 LAB — GLUCOSE, CAPILLARY: Glucose-Capillary: 102 mg/dL — ABNORMAL HIGH (ref 65–99)

## 2017-07-10 SURGERY — COLONOSCOPY WITH PROPOFOL
Anesthesia: General

## 2017-07-10 MED ORDER — PROPOFOL 500 MG/50ML IV EMUL
INTRAVENOUS | Status: AC
  Filled 2017-07-10: qty 50

## 2017-07-10 MED ORDER — LIDOCAINE HCL (CARDIAC) 20 MG/ML IV SOLN
INTRAVENOUS | Status: DC | PRN
  Administered 2017-07-10: 30 mg via INTRAVENOUS

## 2017-07-10 MED ORDER — FENTANYL CITRATE (PF) 100 MCG/2ML IJ SOLN
INTRAMUSCULAR | Status: DC | PRN
Start: 2017-07-10 — End: 2017-07-10
  Administered 2017-07-10: 50 ug via INTRAVENOUS

## 2017-07-10 MED ORDER — FENTANYL CITRATE (PF) 100 MCG/2ML IJ SOLN
INTRAMUSCULAR | Status: AC
  Filled 2017-07-10: qty 2

## 2017-07-10 MED ORDER — PROPOFOL 500 MG/50ML IV EMUL
INTRAVENOUS | Status: DC | PRN
  Administered 2017-07-10: 120 ug/kg/min via INTRAVENOUS

## 2017-07-10 MED ORDER — LIDOCAINE HCL (PF) 2 % IJ SOLN
INTRAMUSCULAR | Status: AC
  Filled 2017-07-10: qty 2

## 2017-07-10 MED ORDER — GLYCOPYRROLATE 0.2 MG/ML IJ SOLN
INTRAMUSCULAR | Status: DC | PRN
  Administered 2017-07-10: 0.2 mg via INTRAVENOUS

## 2017-07-10 MED ORDER — MIDAZOLAM HCL 2 MG/2ML IJ SOLN
INTRAMUSCULAR | Status: AC
  Filled 2017-07-10: qty 2

## 2017-07-10 MED ORDER — SODIUM CHLORIDE 0.9 % IV SOLN
INTRAVENOUS | Status: DC
  Administered 2017-07-10: 1000 mL via INTRAVENOUS

## 2017-07-10 MED ORDER — GLYCOPYRROLATE 0.2 MG/ML IJ SOLN
INTRAMUSCULAR | Status: AC
  Filled 2017-07-10: qty 1

## 2017-07-10 MED ORDER — MIDAZOLAM HCL 2 MG/2ML IJ SOLN
INTRAMUSCULAR | Status: DC | PRN
  Administered 2017-07-10: 2 mg via INTRAVENOUS

## 2017-07-10 NOTE — Anesthesia Postprocedure Evaluation (Signed)
Anesthesia Post Note  Patient: Craig York  Procedure(s) Performed: Procedure(s) (LRB): COLONOSCOPY WITH PROPOFOL (N/A) ESOPHAGOGASTRODUODENOSCOPY (EGD) WITH PROPOFOL (N/A)  Patient location during evaluation: PACU Anesthesia Type: General Level of consciousness: awake and alert and oriented Pain management: pain level controlled Vital Signs Assessment: post-procedure vital signs reviewed and stable Respiratory status: spontaneous breathing Cardiovascular status: blood pressure returned to baseline Anesthetic complications: no     Last Vitals:  Vitals:   07/10/17 1136 07/10/17 1146  BP: (!) 145/72 (!) 140/99  Pulse: 74 65  Resp: 15 19  Temp:    SpO2: 99% 100%    Last Pain:  Vitals:   07/10/17 1116  TempSrc: Tympanic                 Coreen Shippee

## 2017-07-10 NOTE — Transfer of Care (Signed)
Immediate Anesthesia Transfer of Care Note  Patient: Craig York  Procedure(s) Performed: Procedure(s): COLONOSCOPY WITH PROPOFOL (N/A) ESOPHAGOGASTRODUODENOSCOPY (EGD) WITH PROPOFOL (N/A)  Patient Location: PACU  Anesthesia Type:General  Level of Consciousness: awake and sedated  Airway & Oxygen Therapy: Patient Spontanous Breathing and Patient connected to nasal cannula oxygen  Post-op Assessment: Report given to RN and Post -op Vital signs reviewed and stable  Post vital signs: Reviewed and stable  Last Vitals:  Vitals:   07/10/17 0906  BP: (!) 152/92  Pulse: 61  Resp: 20  Temp: (!) 36.4 C  SpO2: 100%    Last Pain:  Vitals:   07/10/17 0906  TempSrc: Tympanic         Complications: No apparent anesthesia complications

## 2017-07-10 NOTE — Anesthesia Procedure Notes (Signed)
Performed by: COOK-MARTIN, Toris Laverdiere Pre-anesthesia Checklist: Patient identified, Emergency Drugs available, Suction available, Patient being monitored and Timeout performed Patient Re-evaluated:Patient Re-evaluated prior to induction Oxygen Delivery Method: Nasal cannula Preoxygenation: Pre-oxygenation with 100% oxygen Induction Type: IV induction Airway Equipment and Method: Bite block Placement Confirmation: CO2 detector and positive ETCO2       

## 2017-07-10 NOTE — H&P (Signed)
[name], MD [facility] [address], [place], [place] [address] Phone:[phone] Fax : [phone]  Primary Care Physician:  [name], MD Primary Gastroenterologist:  Dr. Allen Norris  Pre-Procedure History & Physical: HPI:  Craig York is a 66 y.o. male is here for an endoscopy and colonoscopy.   Past Medical History:  Diagnosis Date  . Diabetes mellitus without complication ([place])   . Hyperlipidemia   . Myocardial infarction [facility])     Past Surgical History:  Procedure Laterality Date  . chest surgery for fungal infection    . HERNIA REPAIR      Prior to Admission medications   Medication Sig Start Date End Date Taking? Authorizing Provider  amLODipine (NORVASC) 10 MG tablet Take 1 tablet (10 mg total) by mouth daily. 02/07/17  Yes [name], MD  aspirin 81 MG tablet Take 1 tablet by mouth daily. 07/23/08  Yes [provider]  empagliflozin (JARDIANCE) 25 MG TABS tablet Take 25 mg by mouth daily. 07/05/17  Yes [name], MD  ferrous sulfate 325 (65 FE) MG tablet Take 325 mg by mouth 2 (two) times daily.   Yes [provider]  glipiZIDE (GLUCOTROL) 10 MG tablet Take 1 tablet (10 mg total) by mouth daily. 02/20/17  Yes [name], MD  ibuprofen (ADVIL,MOTRIN) 800 MG tablet Take 1 tablet (800 mg total) by mouth every 8 (eight) hours as needed (take with food). 01/23/17  Yes [name], MD  metFORMIN (GLUCOPHAGE) 500 MG tablet Take 2 tablets (1,000 mg total) by mouth 2 (two) times daily with a meal. 09/25/16  Yes [name], MD  oxyCODONE-acetaminophen (PERCOCET) 10-325 MG tablet Take 1 tablet by mouth every 8 (eight) hours as needed for pain. 06/21/17  Yes [name], MD  sildenafil (REVATIO) 20 MG tablet 3-5 tablets as needed prior to intercourse, not to exceed 5 tablets in a day 04/12/17  Yes Fisher, Kirstie Peri, MD  tadalafil (CIALIS) 5 MG tablet Take 1-2 tablets (5-10 mg total) by mouth as needed. 10/06/15  Yes [name], MD  valsartan (DIOVAN) 80 MG tablet Take 1 tablet (80 mg total) by mouth daily. 09/25/16  Yes [name], MD  naproxen (NAPROSYN) 500 MG tablet Take 1 tablet (500 mg total) by mouth 2 (two) times daily with a meal. 02/07/17   [name], MD  silver sulfADIAZINE (SILVADENE) 1 % cream Apply 1 application topically daily. 06/07/17   [name], MD    Allergies as of 06/05/2017 - Review Complete 06/05/2017  Allergen Reaction Noted  . Simvastatin  06/04/2015  . Penicillins Rash 06/04/2015    Family History  Problem Relation Age of Onset  . Hypertension Mother     Social History   Social History  . Marital status: Legally Separated    Spouse name: N/A  . Number of children: 2  . Years of education: N/A   Occupational History  . 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 Smoking status: Former Research scientist (life sciences)  . Smokeless tobacco: Former Systems developer    Types: [place] date: 11/27/1968     Comment: used 2 packs per week  . Alcohol use 0.0 oz/week     Comment: occasional, drinks beer   . Drug use: No  . Sexual activity: Not on file   Other Topics Concern  . Not on file  Social History Narrative  . No narrative on file    Review of Systems: See HPI, otherwise negative ROS  Physical Exam: BP (!) 152/92   Pulse 61   Temp (!) 97.5 F (36.4 C) (Tympanic)   Resp 20   Ht 6\' 1"  (1.854 m)   Wt 202 lb (91.6 kg)   SpO2 100%   BMI 26.65 kg/m  General:   Alert,  pleasant and cooperative in NAD Head:  Normocephalic and atraumatic. Neck:  Supple; no masses or thyromegaly. Lungs:  Clear throughout to auscultation.    Heart:  Regular rate and rhythm. Abdomen:  Soft, nontender and nondistended. Normal bowel sounds, without guarding, and without rebound.   Neurologic:  Alert and  oriented x4;  grossly normal neurologically.  Impression/Plan: Craig York is here for an endoscopy and colonoscopy to be  performed for IDA  Risks, benefits, limitations, and alternatives regarding  endoscopy and colonoscopy have been reviewed with the patient.  Questions have been answered.  All parties agreeable.   [name], MD  07/10/2017, 9:23 AM

## 2017-07-10 NOTE — Anesthesia Preprocedure Evaluation (Signed)
Anesthesia Evaluation  Patient identified by MRN, date of birth, ID band Patient awake    Reviewed: Allergy & Precautions, NPO status , Patient's Chart, lab work & pertinent test results  Airway Mallampati: II  TM Distance: >3 FB     Dental   Pulmonary former smoker,    Pulmonary exam normal        Cardiovascular hypertension, + CAD and + Past MI  Normal cardiovascular exam     Neuro/Psych  Neuromuscular disease negative psych ROS   GI/Hepatic Neg liver ROS,   Endo/Other  diabetes, Well Controlled, Type 2, Oral Hypoglycemic Agents  Renal/GU negative Renal ROS  negative genitourinary   Musculoskeletal  (+) Arthritis , Osteoarthritis,    Abdominal Normal abdominal exam  (+)   Peds negative pediatric ROS (+)  Hematology  (+) anemia ,   Anesthesia Other Findings   Reproductive/Obstetrics                             Anesthesia Physical Anesthesia Plan  ASA: III  Anesthesia Plan: General   Post-op Pain Management:    Induction: Intravenous  PONV Risk Score and Plan:   Airway Management Planned: Nasal Cannula  Additional Equipment:   Intra-op Plan:   Post-operative Plan:   Informed Consent: I have reviewed the patients History and Physical, chart, labs and discussed the procedure including the risks, benefits and alternatives for the proposed anesthesia with the patient or authorized representative who has indicated his/her understanding and acceptance.   Dental advisory given  Plan Discussed with: CRNA and Surgeon  Anesthesia Plan Comments:         Anesthesia Quick Evaluation

## 2017-07-10 NOTE — Anesthesia Post-op Follow-up Note (Signed)
Anesthesia QCDR form completed.        

## 2017-07-10 NOTE — Op Note (Signed)
[facility] Gastroenterology Patient Name: Craig York Procedure Date: 07/10/2017 10:48 AM MRN: [id number] Account #: [id number] Date of Birth: May 09, 1951 Admit Type: Outpatient Age: 66 Room: [facility] ENDO ROOM 4 Gender: Male Note Status: Finalized Procedure:            Colonoscopy Indications:          Iron deficiency anemia Providers:            [name] MD, MD Referring MD:         Kirstie Peri. Caryn Section, MD (Referring MD) Medicines:            Propofol per Anesthesia Complications:        No immediate complications. Procedure:            Pre-Anesthesia Assessment:                       - Prior to the procedure, a History and Physical was                        performed, and patient medications and allergies were                        reviewed. The patient's tolerance of previous                        anesthesia was also reviewed. The risks and benefits of                        the procedure and the sedation options and risks were                        discussed with the patient. All questions were                        answered, and informed consent was obtained. Prior                        Anticoagulants: The patient has taken no previous                        anticoagulant or antiplatelet agents. ASA Grade                        Assessment: II - A patient with mild systemic disease.                        After reviewing the risks and benefits, the patient was                        deemed in satisfactory condition to undergo the                        procedure.                       After obtaining informed consent, the colonoscope was                        passed under direct vision. Throughout the procedure,  the patient's blood pressure, pulse, and oxygen                        saturations were monitored continuously. The                        Colonoscope was introduced through the anus and                        advanced  to the the cecum, identified by appendiceal                        orifice and ileocecal valve. The colonoscopy was                        performed without difficulty. The patient tolerated the                        procedure well. The quality of the bowel preparation                        was excellent. Findings:      The perianal and digital rectal examinations were normal.      A 7 mm polyp was found in the descending colon. The polyp was sessile.       The polyp was removed with a cold snare. Resection and retrieval were       complete.      A 3 mm polyp was found in the sigmoid colon. The polyp was sessile. The       polyp was removed with a cold snare. Resection and retrieval were       complete.      Non-bleeding internal hemorrhoids were found during retroflexion. The       hemorrhoids were Grade II (internal hemorrhoids that prolapse but reduce       spontaneously). Impression:           - One 7 mm polyp in the descending colon, removed with                        a cold snare. Resected and retrieved.                       - One 3 mm polyp in the sigmoid colon, removed with a                        cold snare. Resected and retrieved.                       - Non-bleeding internal hemorrhoids. Recommendation:       - Discharge patient to home.                       - Resume previous diet.                       - Continue present medications.                       - Await pathology results.                       -  Repeat colonoscopy in 5 years if polyp adenoma and 10                        years if hyperplastic Procedure Code(s):    --- Professional ---                       [phone], Colonoscopy, flexible; with removal of tumor(s),                        polyp(s), or other lesion(s) by snare technique Diagnosis Code(s):    --- Professional ---                       D50.9, Iron deficiency anemia, unspecified                       D12.4, Benign neoplasm of descending colon                        D12.5, Benign neoplasm of sigmoid colon CPT copyright 2016 American Medical Association. All rights reserved. The codes documented in this report are preliminary and upon coder review may  be revised to meet current compliance requirements. [name] MD, MD 07/10/2017 11:12:28 AM This report has been signed electronically. Number of Addenda: 0 Note Initiated On: 07/10/2017 10:48 AM Scope Withdrawal Time: 0 hours 7 minutes 43 seconds  Total Procedure Duration: 0 hours 10 minutes 58 seconds       [facility]

## 2017-07-10 NOTE — Op Note (Signed)
[facility] Gastroenterology Patient Name: Craig York Procedure Date: 07/10/2017 10:49 AM MRN: [id number] Account #: [id number] Date of Birth: Mar 03, 1951 Admit Type: Outpatient Age: 66 Room: [facility] ENDO ROOM 4 Gender: Male Note Status: Finalized Procedure:            Upper GI endoscopy Indications:          Iron deficiency anemia Providers:            [name] MD, MD Referring MD:         Kirstie Peri. Caryn Section, MD (Referring MD) Medicines:            Propofol per Anesthesia Complications:        No immediate complications. Procedure:            Pre-Anesthesia Assessment:                       - Prior to the procedure, a History and Physical was                        performed, and patient medications and allergies were                        reviewed. The patient's tolerance of previous                        anesthesia was also reviewed. The risks and benefits of                        the procedure and the sedation options and risks were                        discussed with the patient. All questions were                        answered, and informed consent was obtained. Prior                        Anticoagulants: The patient has taken no previous                        anticoagulant or antiplatelet agents. ASA Grade                        Assessment: II - A patient with mild systemic disease.                        After reviewing the risks and benefits, the patient was                        deemed in satisfactory condition to undergo the                        procedure.                       After obtaining informed consent, the endoscope was                        passed under direct vision. Throughout the procedure,  the patient's blood pressure, pulse, and oxygen                        saturations were monitored continuously. The Endoscope                        was introduced through the mouth, and advanced to the             third part of duodenum. The upper GI endoscopy was                        accomplished without difficulty. The patient tolerated                        the procedure well. Findings:      The Z-line was irregular and was found at the gastroesophageal junction.      A single 10 mm sessile polyp with no bleeding and no stigmata of recent       bleeding was found in the gastric antrum. Biopsies were taken with a       cold forceps for histology.      The examined duodenum was normal. Impression:           - Z-line irregular, at the gastroesophageal junction.                       - A single gastric polyp. Biopsied.                       - Normal examined duodenum. Recommendation:       - Await pathology results.                       - Perform a colonoscopy today.                       - Discharge patient to home.                       - Resume previous diet.                       - Continue present medications. Procedure Code(s):    --- Professional ---                       [phone], Esophagogastroduodenoscopy, flexible, transoral;                        with biopsy, single or multiple Diagnosis Code(s):    --- Professional ---                       D50.9, Iron deficiency anemia, unspecified                       K22.8, Other specified diseases of esophagus                       K31.7, Polyp of stomach and duodenum CPT copyright 2016 American Medical Association. All rights reserved. The codes documented in this report are preliminary and upon coder review may  be revised to meet current compliance requirements. [name] MD, MD 07/10/2017 10:58:38 AM This report has  been signed electronically. Number of Addenda: 0 Note Initiated On: 07/10/2017 10:49 AM      [facility]

## 2017-07-11 ENCOUNTER — Encounter: Payer: Self-pay | Admitting: Gastroenterology

## 2017-07-12 LAB — SURGICAL PATHOLOGY

## 2017-07-13 ENCOUNTER — Encounter: Payer: Self-pay | Admitting: Gastroenterology

## 2017-07-19 ENCOUNTER — Telehealth: Payer: Self-pay

## 2017-07-19 DIAGNOSIS — M5416 Radiculopathy, lumbar region: Secondary | ICD-10-CM

## 2017-07-19 MED ORDER — OXYCODONE-ACETAMINOPHEN 10-325 MG PO TABS: 1.0000 | ORAL_TABLET | Freq: Three times a day (TID) | ORAL | 0 refills | Status: DC | PRN

## 2017-07-19 NOTE — Telephone Encounter (Signed)
Patient is requesting a refill on oxyCODONE-acetaminophen (PERCOCET) 10-325 MG tablet. Last OV 07/05/17. Last RF 06/21/17. CB# [phone]

## 2017-07-25 ENCOUNTER — Other Ambulatory Visit: Payer: Self-pay | Admitting: Family Medicine

## 2017-07-25 MED ORDER — EMPAGLIFLOZIN 25 MG PO TABS: 25.0000 mg | ORAL_TABLET | Freq: Every day | ORAL | 0 refills | Status: DC

## 2017-07-25 NOTE — Telephone Encounter (Signed)
Can have 4 sample bottles.

## 2017-07-25 NOTE — Telephone Encounter (Signed)
Samples are ready to pick up. Patient was notified.

## 2017-07-25 NOTE — Telephone Encounter (Signed)
Please advise 

## 2017-07-25 NOTE — Telephone Encounter (Signed)
Pt is requesting samples for empagliflozin (JARDIANCE) 25 MG TABS tablet  .  If we do not have any samples he would like a Rx sent to [place].  CB#[phone]/MW

## 2017-08-10 ENCOUNTER — Ambulatory Visit: Payer: Medicare HMO | Admitting: Family Medicine

## 2017-08-13 ENCOUNTER — Ambulatory Visit: Payer: Self-pay | Admitting: Cardiovascular Disease

## 2017-08-20 ENCOUNTER — Other Ambulatory Visit: Payer: Self-pay | Admitting: Family Medicine

## 2017-08-20 ENCOUNTER — Telehealth: Payer: Self-pay | Admitting: Family Medicine

## 2017-08-20 DIAGNOSIS — M5416 Radiculopathy, lumbar region: Secondary | ICD-10-CM

## 2017-08-20 MED ORDER — OXYCODONE-ACETAMINOPHEN 10-325 MG PO TABS: 1.0000 | ORAL_TABLET | Freq: Three times a day (TID) | ORAL | 0 refills | Status: DC | PRN

## 2017-08-20 MED ORDER — EMPAGLIFLOZIN 25 MG PO TABS: 25.0000 mg | ORAL_TABLET | Freq: Every day | ORAL | 4 refills | Status: DC

## 2017-08-20 NOTE — Telephone Encounter (Signed)
Since we dont have samples of Jardiance 25 mg.  Please call in a months worth to [place] on [address].  ASAP

## 2017-08-20 NOTE — Telephone Encounter (Signed)
[facility] faxed a refill request on the following medications:  metFORMIN (GLUCOPHAGE) 500 MG tablet.  Take 2 tablets by mouth twice daily with a meal.    [facility] [address]/MW

## 2017-08-20 NOTE — Telephone Encounter (Signed)
Pt is requesting samples of empagliflozin (JARDIANCE) 25 MG TABS tablet   Pt contacted office for refill request on the following medications:  oxyCODONE-acetaminophen (PERCOCET) 10-325 MG tablet   CB#[phone]/MW

## 2017-08-20 NOTE — Addendum Note (Signed)
Addended by: [name] on: 08/20/2017 03:29 PM   Modules accepted: Orders

## 2017-08-20 NOTE — Telephone Encounter (Signed)
Advised patient as below.  

## 2017-08-20 NOTE — Telephone Encounter (Signed)
Oxycodone ready, we don't have any samples of Jardiance 25. Hopefully get some in the next week or two.

## 2017-08-21 MED ORDER — METFORMIN HCL 500 MG PO TABS: ORAL_TABLET | ORAL | 4 refills | Status: DC

## 2017-09-19 ENCOUNTER — Other Ambulatory Visit: Payer: Self-pay | Admitting: Family Medicine

## 2017-09-19 DIAGNOSIS — M5416 Radiculopathy, lumbar region: Secondary | ICD-10-CM

## 2017-09-19 MED ORDER — OXYCODONE-ACETAMINOPHEN 10-325 MG PO TABS: 1.0000 | ORAL_TABLET | Freq: Three times a day (TID) | ORAL | 0 refills | Status: DC | PRN

## 2017-09-19 NOTE — Telephone Encounter (Signed)
Pt contacted office for refill request on the following medications:  oxyCODONE-acetaminophen (PERCOCET) 10-325 MG tablet  CB#[phone]/MW

## 2017-10-10 ENCOUNTER — Telehealth: Payer: Self-pay | Admitting: Family Medicine

## 2017-10-10 NOTE — Telephone Encounter (Signed)
FYI--Pt has cancelled appointment to cardiologist for CAD several times. I spoke with him today and he states he feels fine and does not feel he needs referral.He states he was told by Dr Allen Norris before colonoscopy that his heart was fine

## 2017-10-15 ENCOUNTER — Ambulatory Visit: Payer: Self-pay | Admitting: Cardiovascular Disease

## 2017-10-19 ENCOUNTER — Other Ambulatory Visit: Payer: Self-pay | Admitting: Family Medicine

## 2017-10-19 DIAGNOSIS — M5416 Radiculopathy, lumbar region: Secondary | ICD-10-CM

## 2017-10-19 MED ORDER — OXYCODONE-ACETAMINOPHEN 10-325 MG PO TABS: 1.0000 | ORAL_TABLET | Freq: Three times a day (TID) | ORAL | 0 refills | Status: DC | PRN

## 2017-10-19 NOTE — Telephone Encounter (Signed)
Pt contacted office for refill request on the following medications:  oxyCODONE-acetaminophen (PERCOCET) 10-325 MG tablet  [facility] [address].  Please advise. Thanks TNP

## 2017-10-19 NOTE — Telephone Encounter (Signed)
Please review. Thanks!  

## 2017-11-06 ENCOUNTER — Ambulatory Visit: Payer: Medicare HMO | Admitting: Family Medicine

## 2017-11-06 ENCOUNTER — Encounter: Payer: Self-pay | Admitting: Family Medicine

## 2017-11-06 VITALS — BP 112/70 | HR 78 | Temp 98.6°F | Resp 16 | Wt 209.0 lb

## 2017-11-06 DIAGNOSIS — M25552 Pain in left hip: Secondary | ICD-10-CM

## 2017-11-06 DIAGNOSIS — I2510 Atherosclerotic heart disease of native coronary artery without angina pectoris: Secondary | ICD-10-CM | POA: Diagnosis not present

## 2017-11-06 DIAGNOSIS — R809 Proteinuria, unspecified: Secondary | ICD-10-CM | POA: Diagnosis not present

## 2017-11-06 DIAGNOSIS — E1129 Type 2 diabetes mellitus with other diabetic kidney complication: Secondary | ICD-10-CM | POA: Diagnosis not present

## 2017-11-06 LAB — POCT GLYCOSYLATED HEMOGLOBIN (HGB A1C)
ESTIMATED AVERAGE GLUCOSE: 140
HEMOGLOBIN A1C: 6.5

## 2017-11-06 LAB — POCT UA - MICROALBUMIN: Microalbumin Ur, POC: 50 mg/L

## 2017-11-06 MED ORDER — METHYLPREDNISOLONE ACETATE 80 MG/ML IJ SUSP
80.0000 mg | Freq: Once | INTRAMUSCULAR | Status: AC
  Administered 2017-11-06: 80 mg via INTRAMUSCULAR

## 2017-11-06 MED ORDER — ATORVASTATIN CALCIUM 20 MG PO TABS: 20.0000 mg | ORAL_TABLET | Freq: Every day | ORAL | 3 refills | Status: DC

## 2017-11-06 NOTE — Progress Notes (Signed)
Patient: Craig York Male    DOB: 08-09-1951   66 y.o.   MRN: [id number] Visit Date: 11/06/2017  Today's Provider: Lelon Huh, MD   Chief Complaint  Patient presents with  . Diabetes  . Hypertension  . Coronary Artery Disease  . Anemia   Subjective:    HPI  Diabetes Mellitus Type II, Follow-up:   Lab Results  Component Value Date   HGBA1C 6.1 (H) 07/05/2017   HGBA1C 7.4 04/12/2017   HGBA1C 8.2 02/07/2017    Last seen for diabetes 4 months ago.  Management since then includes no changes. He reports good compliance with treatment. He is not having side effects.  Current symptoms include none and have been stable. Home blood sugar records: patient states he occasionally has it checked at his pharmacy, but is unsure of the readings  Episodes of hypoglycemia? no   Current Insulin Regimen: none Most Recent Eye Exam: 02/2017 Weight trend: fluctuating a bit Prior visit with dietician: no Current diet: well balanced Current exercise: walking  Pertinent Labs:    Component Value Date/Time   CHOL 167 04/12/2017 1155   TRIG 81 04/12/2017 1155   HDL 46 04/12/2017 1155   LDLCALC 105 (H) 04/12/2017 1155   CREATININE 0.88 04/12/2017 1155    Wt Readings from Last 3 Encounters:  07/10/17 202 lb (91.6 kg)  07/05/17 204 lb (92.5 kg)  06/07/17 202 lb (91.6 kg)    ------------------------------------------------------------------------  Hypertension, follow-up:  BP Readings from Last 3 Encounters:  07/10/17 (!) 140/99  07/05/17 118/60  06/07/17 130/72    He was last seen for hypertension 9 months ago.  BP at that visit was 100/70. Management since that visit includes discontinuing HCTZ due to low blood pressure. He reports good compliance with treatment. He is not having side effects.  He is exercising. He is not adherent to low salt diet.   Outside blood pressures are not being checked. He is experiencing none.  Patient denies chest pain, chest  pressure/discomfort, claudication, dyspnea, exertional chest pressure/discomfort, fatigue, irregular heart beat, lower extremity edema, near-syncope, orthopnea, palpitations, paroxysmal nocturnal dyspnea, syncope and tachypnea.   Cardiovascular risk factors include advanced age (older than 18 for men, 75 for women), diabetes mellitus, hypertension and male gender.  Use of agents associated with hypertension: NSAIDS.     Weight trend: fluctuating a bit Wt Readings from Last 3 Encounters:  07/10/17 202 lb (91.6 kg)  07/05/17 204 lb (92.5 kg)  06/07/17 202 lb (91.6 kg)    Current diet: well balanced  ------------------------------------------------------------------------ Follow up of CAD:  Patient was last seen for this problem 7 months ago at which time he was having some DOE and chest pressure with exertion. He was referred to cardiology, but was unable to keep appointment due to being called for jury duty. He still gets short of breath on exertion, but states it has been awhile since he had any issues with chest pressure.    Follow up of Iron Deficiency Anemia:  Patient was last seen for this problem 4 months ago. Management during that visit includes ordering labs which showed ferritin  Improved from 8 to 49.[name] No changes were made. Patient was to continue current medications.   He also reports he has been having episodes of pain in his left hip intermittently for a few months. He had a steroid injection for these in previous years which he states worked well and he would like to get steroid injection  today. He had a lot of pain yesterday, but is better today.     Allergies  Allergen Reactions  . Simvastatin     Elevated CK  . Penicillins Rash     Current Outpatient Medications:  .  amLODipine (NORVASC) 10 MG tablet, Take 1 tablet (10 mg total) by mouth daily., Disp: 90 tablet, Rfl: 4 .  aspirin 81 MG tablet, Take 1 tablet by mouth daily., Disp: , Rfl:  .  empagliflozin  (JARDIANCE) 25 MG TABS tablet, Take 25 mg by mouth daily., Disp: 30 tablet, Rfl: 4 .  ferrous sulfate 325 (65 FE) MG tablet, Take 325 mg by mouth 2 (two) times daily., Disp: , Rfl:  .  glipiZIDE (GLUCOTROL) 10 MG tablet, Take 1 tablet (10 mg total) by mouth daily., Disp: 90 tablet, Rfl: 3 .  ibuprofen (ADVIL,MOTRIN) 800 MG tablet, Take 1 tablet (800 mg total) by mouth every 8 (eight) hours as needed (take with food)., Disp: 20 tablet, Rfl: 3 .  metFORMIN (GLUCOPHAGE) 500 MG tablet, TAKE TWO TABLETS BY MOUTH TWICE DAILY WITH A MEAL, Disp: 360 tablet, Rfl: 4 .  oxyCODONE-acetaminophen (PERCOCET) 10-325 MG tablet, Take 1 tablet by mouth every 8 (eight) hours as needed for pain., Disp: 30 tablet, Rfl: 0 .  sildenafil (REVATIO) 20 MG tablet, 3-5 tablets as needed prior to intercourse, not to exceed 5 tablets in a day, Disp: 50 tablet, Rfl: 3 .  tadalafil (CIALIS) 5 MG tablet, Take 1-2 tablets (5-10 mg total) by mouth as needed., Disp: 30 tablet, Rfl: 1 .  valsartan (DIOVAN) 80 MG tablet, Take 1 tablet (80 mg total) by mouth daily., Disp: 90 tablet, Rfl: 4 .  naproxen (NAPROSYN) 500 MG tablet, Take 1 tablet (500 mg total) by mouth 2 (two) times daily with a meal., Disp: 30 tablet, Rfl: 0 .  silver sulfADIAZINE (SILVADENE) 1 % cream, Apply 1 application topically daily., Disp: 400 g, Rfl: 0  Review of Systems  Constitutional: Negative for appetite change, chills and fever.  Respiratory: Negative for chest tightness, shortness of breath and wheezing.   Cardiovascular: Negative for chest pain and palpitations.  Gastrointestinal: Negative for abdominal pain, nausea and vomiting.    Social History   Tobacco Use  . Smoking status: Former Research scientist (life sciences)  . Smokeless tobacco: Former Systems developer    Types: Chew    Quit date: 11/27/1968  . Tobacco comment: used 2 packs per week  Substance Use Topics  . Alcohol use: Yes    Alcohol/week: 0.0 oz    Comment: occasional, drinks beer    Objective:   BP 112/70 (BP Location:  Left Arm, Patient Position: Sitting, Cuff Size: Large)   Pulse 78   Temp 98.6 F (37 C) (Oral)   Resp 16   Wt 209 lb (94.8 kg)   SpO2 98% Comment: room air  BMI 27.57 kg/m  There were no vitals filed for this visit.   Physical Exam   General Appearance:    Alert, cooperative, no distress  Eyes:    PERRL, conjunctiva/corneas clear, EOM's intact       Lungs:     Clear to auscultation bilaterally, respirations unlabored  Heart:    Regular rate and rhythm  Neurologic:   Awake, alert, oriented x 3. No apparent focal neurological           defect.   MS:    FROM of hip, slight tenderness left lateral thigh and buttocks.    Results for orders placed or performed in  visit on 11/06/17  POCT HgB A1C  Result Value Ref Range   Hemoglobin A1C 6.5    Est. average glucose Bld gHb Est-mCnc 140   POCT UA - Microalbumin  Result Value Ref Range   Microalbumin Ur, POC 50 mg/L   Creatinine, POC n/a mg/dL   Albumin/Creatinine Ratio, Urine, POC n/a         Assessment & Plan:     1. Type 2 diabetes mellitus with microalbuminuria, without long-term current use of insulin (HCC) Well controlled.  Continue current medications.   - POCT HgB A1C - POCT UA - Microalbumin  2. Left hip pain Depot-medrol 80mg /ml IM left gluteus .   3. Coronary artery disease involving native coronary artery of native heart without angina pectoris Occasional DOE and chest tightness over the last several month, although no chest pressure or pain for several weeks. Prevously followed by Dr. Clayborn Bigness, last seen in 2015, but he prefers to establish with a different cardiologist.   - Ambulatory referral to Cardiology - atorvastatin (LIPITOR) 20 MG tablet; Take 1 tablet (20 mg total) by mouth daily.  Dispense: 90 tablet; Refill: 3   Return in about 4 months (around 03/07/2018) for diabetes.       Lelon Huh, MD  [facility]

## 2017-11-06 NOTE — Addendum Note (Signed)
Addended by: Randal Buba on: 11/06/2017 02:48 PM   Modules accepted: Orders

## 2017-11-14 ENCOUNTER — Other Ambulatory Visit: Payer: Self-pay | Admitting: Family Medicine

## 2017-11-14 DIAGNOSIS — M5416 Radiculopathy, lumbar region: Secondary | ICD-10-CM

## 2017-11-14 MED ORDER — OXYCODONE-ACETAMINOPHEN 10-325 MG PO TABS: 1.0000 | ORAL_TABLET | Freq: Three times a day (TID) | ORAL | 0 refills | Status: DC | PRN

## 2017-11-14 NOTE — Telephone Encounter (Signed)
Pt contacted office for refill request on the following medications:  oxyCODONE-acetaminophen (PERCOCET) 10-325 MG tablet  [facility] [address]  Please advise. Thanks TNP

## 2017-11-15 ENCOUNTER — Other Ambulatory Visit: Payer: Self-pay | Admitting: Family Medicine

## 2017-11-15 DIAGNOSIS — R0602 Shortness of breath: Secondary | ICD-10-CM | POA: Diagnosis not present

## 2017-11-15 DIAGNOSIS — I208 Other forms of angina pectoris: Secondary | ICD-10-CM | POA: Diagnosis not present

## 2017-11-15 DIAGNOSIS — I2510 Atherosclerotic heart disease of native coronary artery without angina pectoris: Secondary | ICD-10-CM | POA: Diagnosis not present

## 2017-11-15 DIAGNOSIS — R011 Cardiac murmur, unspecified: Secondary | ICD-10-CM | POA: Diagnosis not present

## 2017-11-15 DIAGNOSIS — E782 Mixed hyperlipidemia: Secondary | ICD-10-CM | POA: Diagnosis not present

## 2017-11-15 DIAGNOSIS — E119 Type 2 diabetes mellitus without complications: Secondary | ICD-10-CM | POA: Diagnosis not present

## 2017-11-15 MED ORDER — VALSARTAN 80 MG PO TABS: 80.0000 mg | ORAL_TABLET | Freq: Every day | ORAL | 4 refills | Status: DC

## 2017-11-15 NOTE — Telephone Encounter (Signed)
Pt contacted office for refill request on the following medications:  valsartan (DIOVAN) 80 MG tablet   [facility] [address]  Last Rx: 09/25/16 LOV: 11/06/17  Please advise. Thanks TNP

## 2017-11-30 DIAGNOSIS — I2510 Atherosclerotic heart disease of native coronary artery without angina pectoris: Secondary | ICD-10-CM | POA: Diagnosis not present

## 2017-11-30 DIAGNOSIS — I208 Other forms of angina pectoris: Secondary | ICD-10-CM | POA: Diagnosis not present

## 2017-11-30 DIAGNOSIS — R0602 Shortness of breath: Secondary | ICD-10-CM | POA: Diagnosis not present

## 2017-12-11 DIAGNOSIS — R0602 Shortness of breath: Secondary | ICD-10-CM | POA: Diagnosis not present

## 2017-12-11 DIAGNOSIS — R011 Cardiac murmur, unspecified: Secondary | ICD-10-CM | POA: Diagnosis not present

## 2017-12-11 DIAGNOSIS — I2510 Atherosclerotic heart disease of native coronary artery without angina pectoris: Secondary | ICD-10-CM | POA: Diagnosis not present

## 2017-12-17 ENCOUNTER — Other Ambulatory Visit: Payer: Self-pay | Admitting: Family Medicine

## 2017-12-17 DIAGNOSIS — M5416 Radiculopathy, lumbar region: Secondary | ICD-10-CM

## 2017-12-17 MED ORDER — OXYCODONE-ACETAMINOPHEN 10-325 MG PO TABS: 1.0000 | ORAL_TABLET | Freq: Three times a day (TID) | ORAL | 0 refills | Status: DC | PRN

## 2017-12-17 NOTE — Telephone Encounter (Signed)
Patient would like refill on Oxycodone 10/325 mg. Sent to [facility] on [address]

## 2017-12-17 NOTE — Telephone Encounter (Signed)
Patient was advised.  

## 2018-01-16 ENCOUNTER — Other Ambulatory Visit: Payer: Self-pay | Admitting: Family Medicine

## 2018-01-16 DIAGNOSIS — M5416 Radiculopathy, lumbar region: Secondary | ICD-10-CM

## 2018-01-16 MED ORDER — OXYCODONE-ACETAMINOPHEN 10-325 MG PO TABS: 1.0000 | ORAL_TABLET | Freq: Three times a day (TID) | ORAL | 0 refills | Status: DC | PRN

## 2018-01-16 NOTE — Telephone Encounter (Signed)
Pt. Needs refill on Oxycodone 10-325 mg. To [facility]

## 2018-02-06 ENCOUNTER — Other Ambulatory Visit: Payer: Self-pay | Admitting: Family Medicine

## 2018-02-11 DIAGNOSIS — E113293 Type 2 diabetes mellitus with mild nonproliferative diabetic retinopathy without macular edema, bilateral: Secondary | ICD-10-CM | POA: Diagnosis not present

## 2018-02-11 DIAGNOSIS — H524 Presbyopia: Secondary | ICD-10-CM | POA: Diagnosis not present

## 2018-02-11 LAB — HM DIABETES EYE EXAM

## 2018-02-13 ENCOUNTER — Other Ambulatory Visit: Payer: Self-pay | Admitting: Family Medicine

## 2018-02-13 DIAGNOSIS — M5416 Radiculopathy, lumbar region: Secondary | ICD-10-CM

## 2018-02-13 MED ORDER — OXYCODONE-ACETAMINOPHEN 10-325 MG PO TABS: 1.0000 | ORAL_TABLET | Freq: Three times a day (TID) | ORAL | 0 refills | Status: DC | PRN

## 2018-02-13 NOTE — Telephone Encounter (Signed)
Pt requesting refill of Oxycodone 10-325 sent to [facility]

## 2018-03-05 ENCOUNTER — Ambulatory Visit (INDEPENDENT_AMBULATORY_CARE_PROVIDER_SITE_OTHER): Payer: Medicare HMO | Admitting: Family Medicine

## 2018-03-05 ENCOUNTER — Encounter: Payer: Self-pay | Admitting: Family Medicine

## 2018-03-05 VITALS — BP 104/58 | HR 74 | Temp 98.1°F | Resp 16 | Ht 73.0 in | Wt 207.0 lb

## 2018-03-05 DIAGNOSIS — E1129 Type 2 diabetes mellitus with other diabetic kidney complication: Secondary | ICD-10-CM | POA: Diagnosis not present

## 2018-03-05 DIAGNOSIS — I2510 Atherosclerotic heart disease of native coronary artery without angina pectoris: Secondary | ICD-10-CM | POA: Diagnosis not present

## 2018-03-05 DIAGNOSIS — Z125 Encounter for screening for malignant neoplasm of prostate: Secondary | ICD-10-CM

## 2018-03-05 DIAGNOSIS — R809 Proteinuria, unspecified: Secondary | ICD-10-CM

## 2018-03-05 DIAGNOSIS — I10 Essential (primary) hypertension: Secondary | ICD-10-CM

## 2018-03-05 LAB — POCT GLYCOSYLATED HEMOGLOBIN (HGB A1C)
Est. average glucose Bld gHb Est-mCnc: 148
Hemoglobin A1C: 6.8

## 2018-03-05 MED ORDER — AMLODIPINE BESYLATE 10 MG PO TABS: 5.0000 mg | ORAL_TABLET | Freq: Every day | ORAL | 4 refills | Status: DC

## 2018-03-05 MED ORDER — IBUPROFEN 800 MG PO TABS: 800.0000 mg | ORAL_TABLET | Freq: Three times a day (TID) | ORAL | 3 refills | Status: DC | PRN

## 2018-03-05 NOTE — Progress Notes (Signed)
Patient: Craig York Male    DOB: Jul 26, 1951   67 y.o.   MRN: [id number] Visit Date: 03/05/2018  Today's Provider: Lelon Huh, MD   Chief Complaint  Patient presents with  . Follow-up  . Diabetes  . Hypertension  . Coronary Artery Disease   Subjective:    HPI   Diabetes Mellitus Type II, Follow-up:   Lab Results  Component Value Date   HGBA1C 6.5 11/06/2017   HGBA1C 6.1 (H) 07/05/2017   HGBA1C 7.4 04/12/2017   Last seen for diabetes 4 months ago.  Management since then includes; no changes. He reports good compliance with treatment. He is not having side effects. none Current symptoms include none and have been unchanged. Home blood sugar records: fasting range: 137  Episodes of hypoglycemia? no   Current Insulin Regimen: n/a Most Recent Eye Exam: 02/2017 Weight trend: stable Prior visit with dietician: no Current diet: well balanced Current exercise: walking  ----------------------------------------------------------------   Hypertension, follow-up:  BP Readings from Last 3 Encounters:  03/05/18 (!) 104/58  11/06/17 112/70  07/10/17 (!) 140/99    He was last seen for hypertension 1 year ago.  BP at that visit was 100/70. Management since that visit includes; discontinued HCTZ due to low Bp.He reports good compliance with treatment. He is not having side effects. none He is exercising. He is not adherent to low salt diet.   Outside blood pressures are none. He is experiencing none.  Patient denies none.   Cardiovascular risk factors include diabetes mellitus.  Use of agents associated with hypertension: none ------------------------------------------------------------------------ Coronary artery disease involving native coronary artery of native heart without angina pectoris From 11/06/2017-referred to Cardiology and seen by Dr. Clayborn Bigness. He had unremarkable echo and Myoview in January. Denies chet pain or dyspnea.     Allergies    Allergen Reactions  . Simvastatin     Elevated CK  . Penicillins Rash     Current Outpatient Medications:  .  amLODipine (NORVASC) 10 MG tablet, Take 1 tablet (10 mg total) by mouth daily., Disp: 90 tablet, Rfl: 4 .  aspirin 81 MG tablet, Take 1 tablet by mouth daily., Disp: , Rfl:  .  atorvastatin (LIPITOR) 20 MG tablet, Take 1 tablet (20 mg total) by mouth daily., Disp: 90 tablet, Rfl: 3 .  empagliflozin (JARDIANCE) 25 MG TABS tablet, Take 25 mg by mouth daily., Disp: 30 tablet, Rfl: 4 .  ferrous sulfate 325 (65 FE) MG tablet, Take 325 mg by mouth 2 (two) times daily., Disp: , Rfl:  .  glipiZIDE (GLUCOTROL) 10 MG tablet, TAKE 1 TABLET BY MOUTH ONCE DAILY, Disp: 90 tablet, Rfl: 3 .  ibuprofen (ADVIL,MOTRIN) 800 MG tablet, Take 1 tablet (800 mg total) by mouth every 8 (eight) hours as needed (take with food)., Disp: 20 tablet, Rfl: 3 .  metFORMIN (GLUCOPHAGE) 500 MG tablet, TAKE TWO TABLETS BY MOUTH TWICE DAILY WITH A MEAL, Disp: 360 tablet, Rfl: 4 .  naproxen (NAPROSYN) 500 MG tablet, Take 1 tablet (500 mg total) by mouth 2 (two) times daily with a meal., Disp: 30 tablet, Rfl: 0 .  oxyCODONE-acetaminophen (PERCOCET) 10-325 MG tablet, Take 1 tablet by mouth every 8 (eight) hours as needed for pain., Disp: 30 tablet, Rfl: 0 .  sildenafil (REVATIO) 20 MG tablet, 3-5 tablets as needed prior to intercourse, not to exceed 5 tablets in a day, Disp: 50 tablet, Rfl: 3 .  silver sulfADIAZINE (SILVADENE) 1 % cream, Apply 1  application topically daily., Disp: 400 g, Rfl: 0 .  tadalafil (CIALIS) 5 MG tablet, Take 1-2 tablets (5-10 mg total) by mouth as needed., Disp: 30 tablet, Rfl: 1 .  valsartan (DIOVAN) 80 MG tablet, Take 1 tablet (80 mg total) by mouth daily., Disp: 90 tablet, Rfl: 4  Review of Systems  Constitutional: Negative for appetite change, chills and fever.  Respiratory: Negative for chest tightness, shortness of breath and wheezing.   Cardiovascular: Negative for chest pain and  palpitations.  Gastrointestinal: Negative for abdominal pain, nausea and vomiting.    Social History   Tobacco Use  . Smoking status: Former Research scientist (life sciences)  . Smokeless tobacco: Former Systems developer    Types: Chew    Quit date: 11/27/1968  . Tobacco comment: used 2 packs per week  Substance Use Topics  . Alcohol use: Yes    Alcohol/week: 0.0 oz    Comment: occasional, drinks beer    Objective:   BP (!) 104/58 (BP Location: Right Arm, Patient Position: Sitting, Cuff Size: Large)   Pulse 74   Temp 98.1 F (36.7 C) (Oral)   Resp 16   Ht 6\' 1"  (1.854 m)   Wt 207 lb (93.9 kg)   SpO2 96%   BMI 27.31 kg/m  Vitals:   03/05/18 1138  BP: (!) 104/58  Pulse: 74  Resp: 16  Temp: 98.1 F (36.7 C)  TempSrc: Oral  SpO2: 96%  Weight: 207 lb (93.9 kg)  Height: 6\' 1"  (1.854 m)     Physical Exam   General Appearance:    Alert, cooperative, no distress  Eyes:    PERRL, conjunctiva/corneas clear, EOM's intact       Lungs:     Clear to auscultation bilaterally, respirations unlabored  Heart:    Regular rate and rhythm  Neurologic:   Awake, alert, oriented x 3. No apparent focal neurological           defect.       Results for orders placed or performed in visit on 03/05/18  POCT glycosylated hemoglobin (Hb A1C)  Result Value Ref Range   Hemoglobin A1C 6.8    Est. average glucose Bld gHb Est-mCnc 148        Assessment & Plan:     1. Type 2 diabetes mellitus with microalbuminuria, without long-term current use of insulin (HCC) Well controlled.  Continue current medications.   - POCT glycosylated hemoglobin (Hb A1C) - Comprehensive metabolic panel - Lipid panel  2. Coronary artery disease involving native coronary artery of native heart without angina pectoris Asymptomatic. Compliant with medication.  Continue aggressive risk factor modification.   - Comprehensive metabolic panel - Lipid panel  3. Prostate cancer screening  - PSA  4. Essential (primary) hypertension BP very well  controlled since adding Jardiance. Will reduce amlodipine to 1/2 tablet daily.  - amLODipine (NORVASC) 10 MG tablet; Take 0.5 tablets (5 mg total) by mouth daily.  Dispense: 3 tablet; Refill: [place], MD  [facility]

## 2018-03-05 NOTE — Patient Instructions (Signed)
   I recommend that you get the Pneumovax-23 vaccine to protect yourself from certain strains of pneumonia. Please call our office at [phone] at your earliest convenience to schedule this vaccine.     You can start cutting amlodipine tablets in half and just take 1/2 pill every day

## 2018-03-06 ENCOUNTER — Telehealth: Payer: Self-pay | Admitting: *Deleted

## 2018-03-06 DIAGNOSIS — E782 Mixed hyperlipidemia: Secondary | ICD-10-CM

## 2018-03-06 LAB — COMPREHENSIVE METABOLIC PANEL
A/G RATIO: 1.4 (ref 1.2–2.2)
ALK PHOS: 70 IU/L (ref 39–117)
ALT: 27 IU/L (ref 0–44)
AST: 24 IU/L (ref 0–40)
Albumin: 4.3 g/dL (ref 3.6–4.8)
BUN/Creatinine Ratio: 13 (ref 10–24)
BUN: 12 mg/dL (ref 8–27)
Bilirubin Total: 0.5 mg/dL (ref 0.0–1.2)
CO2: 21 mmol/L (ref 20–29)
Calcium: 9.1 mg/dL (ref 8.6–10.2)
Chloride: 103 mmol/L (ref 96–106)
Creatinine, Ser: 0.9 mg/dL (ref 0.76–1.27)
GFR calc Af Amer: 103 mL/min/{1.73_m2} (ref >59)
GFR calc non Af Amer: 89 mL/min/{1.73_m2} (ref >59)
GLOBULIN, TOTAL: 3.1 g/dL (ref 1.5–4.5)
Glucose: 124 mg/dL — ABNORMAL HIGH (ref 65–99)
Potassium: 3.9 mmol/L (ref 3.5–5.2)
SODIUM: 140 mmol/L (ref 134–144)
Total Protein: 7.4 g/dL (ref 6.0–8.5)

## 2018-03-06 LAB — LIPID PANEL
CHOL/HDL RATIO: 3.8 ratio (ref 0.0–5.0)
CHOLESTEROL TOTAL: 161 mg/dL (ref 100–199)
HDL: 42 mg/dL (ref >39)
LDL Calculated: 86 mg/dL (ref 0–99)
TRIGLYCERIDES: 165 mg/dL — AB (ref 0–149)
VLDL Cholesterol Cal: 33 mg/dL (ref 5–40)

## 2018-03-06 LAB — PSA: Prostate Specific Ag, Serum: 3.7 ng/mL (ref 0.0–4.0)

## 2018-03-06 NOTE — Telephone Encounter (Signed)
LMOVM for pt to return call 

## 2018-03-06 NOTE — Telephone Encounter (Signed)
-----   Message from [name], MD sent at 03/06/2018  7:45 AM EDT ----- LDL cholesterol is 86, needs to be under 70. Increase atorvastatin to 40mg  once a day, #90, rf x 2  (he can use up the 20s by taking 2 a day) follow up in September as scheduled.

## 2018-03-07 ENCOUNTER — Encounter: Payer: Self-pay | Admitting: *Deleted

## 2018-03-07 MED ORDER — ATORVASTATIN CALCIUM 40 MG PO TABS: 40.0000 mg | ORAL_TABLET | Freq: Every day | ORAL | 2 refills | Status: DC

## 2018-03-07 NOTE — Telephone Encounter (Signed)
Patient advised and verbally voiced understanding. New prescription for Atorvastatin 40mg  sent into the pharmacy.

## 2018-03-14 ENCOUNTER — Other Ambulatory Visit: Payer: Self-pay | Admitting: Family Medicine

## 2018-03-14 DIAGNOSIS — M5416 Radiculopathy, lumbar region: Secondary | ICD-10-CM

## 2018-03-14 MED ORDER — OXYCODONE-ACETAMINOPHEN 10-325 MG PO TABS: 1.0000 | ORAL_TABLET | Freq: Three times a day (TID) | ORAL | 0 refills | Status: DC | PRN

## 2018-03-14 NOTE — Telephone Encounter (Signed)
Patient is requesting a refill on the following medication  oxyCODONE-acetaminophen (PERCOCET) 10-325 MG tablet   He would like this sent to [facility] on [address].

## 2018-04-12 ENCOUNTER — Other Ambulatory Visit: Payer: Self-pay | Admitting: Family Medicine

## 2018-04-12 DIAGNOSIS — M5416 Radiculopathy, lumbar region: Secondary | ICD-10-CM

## 2018-04-12 MED ORDER — EMPAGLIFLOZIN 25 MG PO TABS: 25.0000 mg | ORAL_TABLET | Freq: Every day | ORAL | 4 refills | Status: DC

## 2018-04-12 MED ORDER — OXYCODONE-ACETAMINOPHEN 10-325 MG PO TABS: 1.0000 | ORAL_TABLET | Freq: Three times a day (TID) | ORAL | 0 refills | Status: DC | PRN

## 2018-04-12 NOTE — Telephone Encounter (Signed)
Pt needs refill on oxycodone 10-325  [facility] [address]   Thanks [employer]

## 2018-04-12 NOTE — Telephone Encounter (Signed)
He also needs refill on his Jardiance 25mg 

## 2018-05-13 ENCOUNTER — Other Ambulatory Visit: Payer: Self-pay | Admitting: Family Medicine

## 2018-05-13 DIAGNOSIS — M5416 Radiculopathy, lumbar region: Secondary | ICD-10-CM

## 2018-05-13 MED ORDER — OXYCODONE-ACETAMINOPHEN 10-325 MG PO TABS: 1.0000 | ORAL_TABLET | Freq: Three times a day (TID) | ORAL | 0 refills | Status: DC | PRN

## 2018-05-13 NOTE — Telephone Encounter (Signed)
Pt contacted office for refill request on the following medications:  oxyCODONE-acetaminophen (PERCOCET) 10-325 MG tablet  [facility] [address]  Last Rx: 04/12/18 LOV: 03/05/18 Please advise. Thanks TNP

## 2018-06-12 ENCOUNTER — Other Ambulatory Visit: Payer: Self-pay | Admitting: Family Medicine

## 2018-06-12 DIAGNOSIS — M5416 Radiculopathy, lumbar region: Secondary | ICD-10-CM

## 2018-06-12 MED ORDER — OXYCODONE-ACETAMINOPHEN 10-325 MG PO TABS: 1.0000 | ORAL_TABLET | Freq: Three times a day (TID) | ORAL | 0 refills | Status: DC | PRN

## 2018-06-12 NOTE — Telephone Encounter (Signed)
Pt contacted office for refill request on the following medications:  oxyCODONE-acetaminophen (PERCOCET) 10-325 MG tablet   [facility] [address]  Last Rx: 05/13/18 LOV: 03/05/18 Please advise. Thanks TNP

## 2018-07-11 ENCOUNTER — Other Ambulatory Visit: Payer: Self-pay

## 2018-07-11 DIAGNOSIS — M5416 Radiculopathy, lumbar region: Secondary | ICD-10-CM

## 2018-07-11 MED ORDER — OXYCODONE-ACETAMINOPHEN 10-325 MG PO TABS: 1.0000 | ORAL_TABLET | Freq: Three times a day (TID) | ORAL | 0 refills | Status: DC | PRN

## 2018-08-06 ENCOUNTER — Ambulatory Visit (INDEPENDENT_AMBULATORY_CARE_PROVIDER_SITE_OTHER): Payer: Medicare HMO | Admitting: Family Medicine

## 2018-08-06 ENCOUNTER — Encounter: Payer: Self-pay | Admitting: Family Medicine

## 2018-08-06 ENCOUNTER — Ambulatory Visit: Payer: Self-pay | Admitting: Family Medicine

## 2018-08-06 VITALS — BP 121/79 | HR 74 | Temp 98.1°F | Resp 16 | Ht 73.0 in | Wt 202.0 lb

## 2018-08-06 DIAGNOSIS — R809 Proteinuria, unspecified: Secondary | ICD-10-CM

## 2018-08-06 DIAGNOSIS — L0231 Cutaneous abscess of buttock: Secondary | ICD-10-CM | POA: Diagnosis not present

## 2018-08-06 DIAGNOSIS — M25552 Pain in left hip: Secondary | ICD-10-CM | POA: Diagnosis not present

## 2018-08-06 DIAGNOSIS — L03317 Cellulitis of buttock: Secondary | ICD-10-CM

## 2018-08-06 DIAGNOSIS — E1129 Type 2 diabetes mellitus with other diabetic kidney complication: Secondary | ICD-10-CM

## 2018-08-06 DIAGNOSIS — E785 Hyperlipidemia, unspecified: Secondary | ICD-10-CM | POA: Diagnosis not present

## 2018-08-06 LAB — POCT GLYCOSYLATED HEMOGLOBIN (HGB A1C)
ESTIMATED AVERAGE GLUCOSE: 137
HEMOGLOBIN A1C: 6.4 % — AB (ref 4.0–5.6)

## 2018-08-06 MED ORDER — METHYLPREDNISOLONE ACETATE 80 MG/ML IJ SUSP
80.0000 mg | Freq: Once | INTRAMUSCULAR | Status: AC
  Administered 2018-08-06: 80 mg via INTRAMUSCULAR

## 2018-08-06 MED ORDER — SILVER SULFADIAZINE 1 % EX CREA: 1.0000 "application " | TOPICAL_CREAM | Freq: Every day | CUTANEOUS | 0 refills | Status: DC

## 2018-08-06 NOTE — Progress Notes (Signed)
Patient: Craig York Male    DOB: Nov 24, 1951   67 y.o.   MRN: [id number] Visit Date: 08/06/2018  Today's Provider: Lelon Huh, MD   Chief Complaint  Patient presents with  . Follow-up  . Diabetes  . Hypertension  . Hyperlipidemia   Subjective:    HPI   Diabetes Mellitus Type II, Follow-up:   Lab Results  Component Value Date   HGBA1C 6.4 (A) 08/06/2018   HGBA1C 6.8 03/05/2018   HGBA1C 6.5 11/06/2017   Last seen for diabetes 5 months ago.  Management since then includes; no changes. He reports good compliance with treatment. He is not having side effects. none Current symptoms include none and have been unchanged. Home blood sugar records: fasting range: 137  Episodes of hypoglycemia? no   Current Insulin Regimen: n/a  Most Recent Eye Exam: 02/11/18 Weight trend: stable Prior visit with dietician: no Current diet: well balanced Current exercise: walking  ---------------------------------------------------------------   Hypertension, follow-up:  BP Readings from Last 3 Encounters:  08/06/18 121/79  03/05/18 (!) 104/58  11/06/17 112/70    He was last seen for hypertension 5 months ago.  BP at that visit was 03/05/2018. Management since that visit includes; reduced amlodipine to 1/2 tablet qd.He reports good compliance with treatment. He is not having side effects. none He is exercising. He is not adherent to low salt diet.   Outside blood pressures are 115/78. He is experiencing none.  Patient denies none.   Cardiovascular risk factors include diabetes mellitus.  Use of agents associated with hypertension: none.   ---------------------------------------------------------------    Lipid/Cholesterol, Follow-up:   Last seen for this 5 months ago.  Management since that visit includes; labs checked. Increased atorvastatin to 40 mg qd.  Last Lipid Panel:    Component Value Date/Time   CHOL 161 03/05/2018 1219   TRIG 165 (H)  03/05/2018 1219   HDL 42 03/05/2018 1219   CHOLHDL 3.8 03/05/2018 1219   CHOLHDL 5.5 02/05/2008 0355   VLDL 23 02/05/2008 0355   LDLCALC 86 03/05/2018 1219    He reports good compliance with treatment. He is not having side effects. none  Wt Readings from Last 3 Encounters:  08/06/18 202 lb (91.6 kg)  03/05/18 207 lb (93.9 kg)  11/06/17 209 lb (94.8 kg)    ---------------------------------------------------------------  Coronary artery disease involving native coronary artery of native heart without angina pectoris From 03/05/2018-labs checked, increased atorvastatin which he is tolerating well without adverse effects.   He also complains of pain in the back of his left hip for the last couple of weeks. Doesn't recall any injury. Has had similar sx in the past which responds well to steroid injections.    Allergies  Allergen Reactions  . Simvastatin     Elevated CK  . Penicillins Rash     Current Outpatient Medications:  .  amLODipine (NORVASC) 10 MG tablet, Take 0.5 tablets (5 mg total) by mouth daily., Disp: 3 tablet, Rfl: 4 .  aspirin 81 MG tablet, Take 1 tablet by mouth daily., Disp: , Rfl:  .  atorvastatin (LIPITOR) 40 MG tablet, Take 1 tablet (40 mg total) by mouth daily., Disp: 90 tablet, Rfl: 2 .  empagliflozin (JARDIANCE) 25 MG TABS tablet, Take 25 mg by mouth daily., Disp: 30 tablet, Rfl: 4 .  glipiZIDE (GLUCOTROL) 10 MG tablet, TAKE 1 TABLET BY MOUTH ONCE DAILY, Disp: 90 tablet, Rfl: 3 .  ibuprofen (ADVIL,MOTRIN) 800 MG tablet, TAKE 1  TABLET BY MOUTH EVERY 8 HOURS AS NEEDED WITH FOOD, Disp: 20 tablet, Rfl: 3 .  metFORMIN (GLUCOPHAGE) 500 MG tablet, TAKE TWO TABLETS BY MOUTH TWICE DAILY WITH A MEAL, Disp: 360 tablet, Rfl: 4 .  oxyCODONE-acetaminophen (PERCOCET) 10-325 MG tablet, Take 1 tablet by mouth every 8 (eight) hours as needed for pain., Disp: 30 tablet, Rfl: 0 .  sildenafil (REVATIO) 20 MG tablet, 3-5 tablets as needed prior to intercourse, not to exceed 5  tablets in a day, Disp: 50 tablet, Rfl: 3 .  silver sulfADIAZINE (SILVADENE) 1 % cream, Apply 1 application topically daily., Disp: 400 g, Rfl: 0 .  tadalafil (CIALIS) 5 MG tablet, Take 1-2 tablets (5-10 mg total) by mouth as needed., Disp: 30 tablet, Rfl: 1 .  valsartan (DIOVAN) 80 MG tablet, Take 1 tablet (80 mg total) by mouth daily., Disp: 90 tablet, Rfl: 4 .  ferrous sulfate 325 (65 FE) MG tablet, Take 325 mg by mouth 2 (two) times daily., Disp: , Rfl:   Review of Systems  Constitutional: Negative for appetite change, chills and fever.  Respiratory: Negative for chest tightness, shortness of breath and wheezing.   Cardiovascular: Negative for chest pain and palpitations.  Gastrointestinal: Negative for abdominal pain, nausea and vomiting.    Social History   Tobacco Use  . Smoking status: Former Research scientist (life sciences)  . Smokeless tobacco: Former Systems developer    Types: Chew    Quit date: 11/27/1968  . Tobacco comment: used 2 packs per week  Substance Use Topics  . Alcohol use: Yes    Alcohol/week: 0.0 standard drinks    Comment: occasional, drinks beer    Objective:   BP 121/79 (BP Location: Right Arm, Patient Position: Sitting, Cuff Size: Large)   Pulse 74   Temp 98.1 F (36.7 C) (Oral)   Resp 16   Ht 6\' 1"  (1.854 m)   Wt 202 lb (91.6 kg)   SpO2 97%   BMI 26.65 kg/m  Vitals:   08/06/18 1027  BP: 121/79  Pulse: 74  Resp: 16  Temp: 98.1 F (36.7 C)  TempSrc: Oral  SpO2: 97%  Weight: 202 lb (91.6 kg)  Height: 6\' 1"  (1.854 m)     Physical Exam   General Appearance:    Alert, cooperative, no distress  Eyes:    PERRL, conjunctiva/corneas clear, EOM's intact       Lungs:     Clear to auscultation bilaterally, respirations unlabored  Heart:    Regular rate and rhythm  MS:   Tender of left ischium.       Results for orders placed or performed in visit on 08/06/18  POCT glycosylated hemoglobin (Hb A1C)  Result Value Ref Range   Hemoglobin A1C 6.4 (A) 4.0 - 5.6 %   HbA1c POC (<>  result, manual entry)     HbA1c, POC (prediabetic range)     HbA1c, POC (controlled diabetic range)     Est. average glucose Bld gHb Est-mCnc 137         Assessment & Plan:     1. Type 2 diabetes mellitus with microalbuminuria, without long-term current use of insulin (HCC) Well controlled.  Continue current medications.   - POCT glycosylated hemoglobin (Hb A1C)  2. Hyperlipidemia, unspecified hyperlipidemia type He is tolerating atorvastatin well with no adverse effects.   - Comprehensive metabolic panel - Lipid panel  3. Left hip pain  - methylPREDNISolone acetate (DEPO-MEDROL) injection 80 mg  4. Cellulitis and abscess of buttock He requests refill  for- silver sulfADIAZINE (SILVADENE) 1 % cream; Apply 1 application topically daily.  Dispense: 400 g; Refill: 0 Which he uses occasional for sores.       Lelon Huh, MD  [facility]

## 2018-08-07 ENCOUNTER — Telehealth: Payer: Self-pay

## 2018-08-07 LAB — COMPREHENSIVE METABOLIC PANEL
A/G RATIO: 1.3 (ref 1.2–2.2)
ALT: 30 IU/L (ref 0–44)
AST: 27 IU/L (ref 0–40)
Albumin: 4.6 g/dL (ref 3.6–4.8)
Alkaline Phosphatase: 76 IU/L (ref 39–117)
BILIRUBIN TOTAL: 0.5 mg/dL (ref 0.0–1.2)
BUN / CREAT RATIO: 14 (ref 10–24)
BUN: 14 mg/dL (ref 8–27)
CO2: 19 mmol/L — ABNORMAL LOW (ref 20–29)
Calcium: 9.7 mg/dL (ref 8.6–10.2)
Chloride: 105 mmol/L (ref 96–106)
Creatinine, Ser: 1.01 mg/dL (ref 0.76–1.27)
GFR, EST AFRICAN AMERICAN: 89 mL/min/{1.73_m2} (ref >59)
GFR, EST NON AFRICAN AMERICAN: 77 mL/min/{1.73_m2} (ref >59)
Globulin, Total: 3.5 g/dL (ref 1.5–4.5)
Glucose: 66 mg/dL (ref 65–99)
POTASSIUM: 4 mmol/L (ref 3.5–5.2)
SODIUM: 142 mmol/L (ref 134–144)
Total Protein: 8.1 g/dL (ref 6.0–8.5)

## 2018-08-07 LAB — LIPID PANEL
CHOL/HDL RATIO: 3.3 ratio (ref 0.0–5.0)
Cholesterol, Total: 164 mg/dL (ref 100–199)
HDL: 49 mg/dL (ref >39)
LDL CALC: 93 mg/dL (ref 0–99)
TRIGLYCERIDES: 112 mg/dL (ref 0–149)
VLDL Cholesterol Cal: 22 mg/dL (ref 5–40)

## 2018-08-07 NOTE — Telephone Encounter (Signed)
Pt advised.  Apt made for 02/11/2019  Thanks,   -Mickel Baas

## 2018-08-07 NOTE — Telephone Encounter (Signed)
-----   Message from [name], MD sent at 08/07/2018  7:42 AM EDT ----- Cholesterol is well controlled at 164. Normal kidney functions and electrolytes. Continue current medications.  Follow up for diabetes 6 months.

## 2018-08-12 ENCOUNTER — Other Ambulatory Visit: Payer: Self-pay | Admitting: Family Medicine

## 2018-08-12 DIAGNOSIS — M5416 Radiculopathy, lumbar region: Secondary | ICD-10-CM

## 2018-08-12 MED ORDER — OXYCODONE-ACETAMINOPHEN 10-325 MG PO TABS: 1.0000 | ORAL_TABLET | Freq: Three times a day (TID) | ORAL | 0 refills | Status: DC | PRN

## 2018-08-12 NOTE — Telephone Encounter (Signed)
Pt contacted office for refill request on the following medications:  oxyCODONE-acetaminophen (PERCOCET) 10-325 MG tablet  [facility] [address]  Last Rx: 07/11/18 LOV: 08/06/18 Please advise. Thanks TNP

## 2018-09-11 ENCOUNTER — Other Ambulatory Visit: Payer: Self-pay | Admitting: Family Medicine

## 2018-09-11 DIAGNOSIS — M5416 Radiculopathy, lumbar region: Secondary | ICD-10-CM

## 2018-09-11 MED ORDER — OXYCODONE-ACETAMINOPHEN 10-325 MG PO TABS: 1.0000 | ORAL_TABLET | Freq: Three times a day (TID) | ORAL | 0 refills | Status: DC | PRN

## 2018-09-11 NOTE — Telephone Encounter (Signed)
Pt needing a refill on: ° °oxyCODONE-acetaminophen (PERCOCET) 10-325 MG tablet ° °Please fill at: ° °[id number] - [place] (N), [place] - [address] [phone] (Phone) °[phone] (Fax)  ° °Thanks, °[facility] °

## 2018-09-11 NOTE — Telephone Encounter (Signed)
Please advise 

## 2018-09-12 ENCOUNTER — Ambulatory Visit (INDEPENDENT_AMBULATORY_CARE_PROVIDER_SITE_OTHER): Payer: Medicare HMO

## 2018-09-12 VITALS — BP 136/72 | HR 74 | Temp 98.1°F | Ht 73.0 in | Wt 208.2 lb

## 2018-09-12 DIAGNOSIS — Z0000 Encounter for general adult medical examination without abnormal findings: Secondary | ICD-10-CM | POA: Diagnosis not present

## 2018-09-12 NOTE — Patient Instructions (Addendum)
[name] , Thank you for taking time to come for your Medicare Wellness Visit. I appreciate your ongoing commitment to your health goals. Please review the following plan we discussed and let me know if I can assist you in the future.   Screening recommendations/referrals: Colonoscopy: Up to date Recommended yearly ophthalmology/optometry visit for glaucoma screening and checkup Recommended yearly dental visit for hygiene and checkup  Vaccinations: Influenza vaccine: Pt declines today.  Pneumococcal vaccine: Pt declines today.  Tdap vaccine: Up to date Shingles vaccine: Pt declines today.     Advanced directives: Advance directive discussed with you today. Even though you declined this today please call our office should you change your mind and we can give you the proper paperwork for you to fill out.  Conditions/risks identified: Continue monitoring diabetic diet and drinking 8 glasses of water a day.   Next appointment: 02/10/18 with Dr Caryn Section. Pt delcined scheduling a CPE for this year. AWV scheduled for 09/16/19.  Preventive Care 67 Years and Older, Male Preventive care refers to lifestyle choices and visits with your health care provider that can promote health and wellness. What does preventive care include?  A yearly physical exam. This is also called an annual well check.  Dental exams once or twice a year.  Routine eye exams. Ask your health care provider how often you should have your eyes checked.  Personal lifestyle choices, including:  Daily care of your teeth and gums.  Regular physical activity.  Eating a healthy diet.  Avoiding tobacco and drug use.  Limiting alcohol use.  Practicing safe sex.  Taking low doses of aspirin every day.  Taking vitamin and mineral supplements as recommended by your health care provider. What happens during an annual well check? The services and screenings done by your health care provider during your annual well check  will depend on your age, overall health, lifestyle risk factors, and family history of disease. Counseling  Your health care provider may ask you questions about your:  Alcohol use.  Tobacco use.  Drug use.  Emotional well-being.  Home and relationship well-being.  Sexual activity.  Eating habits.  History of falls.  Memory and ability to understand (cognition).  Work and work Statistician. Screening  You may have the following tests or measurements:  Height, weight, and BMI.  Blood pressure.  Lipid and cholesterol levels. These may be checked every 5 years, or more frequently if you are over 3 years old.  Skin check.  Lung cancer screening. You may have this screening every year starting at age 66 if you have a 30-pack-year history of smoking and currently smoke or have quit within the past 15 years.  Fecal occult blood test (FOBT) of the stool. You may have this test every year starting at age 38.  Flexible sigmoidoscopy or colonoscopy. You may have a sigmoidoscopy every 5 years or a colonoscopy every 10 years starting at age 56.  Prostate cancer screening. Recommendations will vary depending on your family history and other risks.  Hepatitis C blood test.  Hepatitis B blood test.  Sexually transmitted disease (STD) testing.  Diabetes screening. This is done by checking your blood sugar (glucose) after you have not eaten for a while (fasting). You may have this done every 1-3 years.  Abdominal aortic aneurysm (AAA) screening. You may need this if you are a current or former smoker.  Osteoporosis. You may be screened starting at age 2 if you are at high risk. Talk with your  health care provider about your test results, treatment options, and if necessary, the need for more tests. Vaccines  Your health care provider may recommend certain vaccines, such as:  Influenza vaccine. This is recommended every year.  Tetanus, diphtheria, and acellular pertussis  (Tdap, Td) vaccine. You may need a Td booster every 10 years.  Zoster vaccine. You may need this after age 18.  Pneumococcal 13-valent conjugate (PCV13) vaccine. One dose is recommended after age 9.  Pneumococcal polysaccharide (PPSV23) vaccine. One dose is recommended after age 70. Talk to your health care provider about which screenings and vaccines you need and how often you need them. This information is not intended to replace advice given to you by your health care provider. Make sure you discuss any questions you have with your health care provider. Document Released: 12/10/2015 Document Revised: 08/02/2016 Document Reviewed: 09/14/2015 Elsevier Interactive Patient Education  2017 [employer] Prevention in the Home Falls can cause injuries. They can happen to people of all ages. There are many things you can do to make your home safe and to help prevent falls. What can I do on the outside of my home?  Regularly fix the edges of walkways and driveways and fix any cracks.  Remove anything that might make you trip as you walk through a door, such as a raised step or threshold.  Trim any bushes or trees on the path to your home.  Use bright outdoor lighting.  Clear any walking paths of anything that might make someone trip, such as rocks or tools.  Regularly check to see if handrails are loose or broken. Make sure that both sides of any steps have handrails.  Any raised decks and porches should have guardrails on the edges.  Have any leaves, snow, or ice cleared regularly.  Use sand or salt on walking paths during winter.  Clean up any spills in your garage right away. This includes oil or grease spills. What can I do in the bathroom?  Use night lights.  Install grab bars by the toilet and in the tub and shower. Do not use towel bars as grab bars.  Use non-skid mats or decals in the tub or shower.  If you need to sit down in the shower, use a plastic, non-slip  stool.  Keep the floor dry. Clean up any water that spills on the floor as soon as it happens.  Remove soap buildup in the tub or shower regularly.  Attach bath mats securely with double-sided non-slip rug tape.  Do not have throw rugs and other things on the floor that can make you trip. What can I do in the bedroom?  Use night lights.  Make sure that you have a light by your bed that is easy to reach.  Do not use any sheets or blankets that are too big for your bed. They should not hang down onto the floor.  Have a firm chair that has side arms. You can use this for support while you get dressed.  Do not have throw rugs and other things on the floor that can make you trip. What can I do in the kitchen?  Clean up any spills right away.  Avoid walking on wet floors.  Keep items that you use a lot in easy-to-reach places.  If you need to reach something above you, use a strong step stool that has a grab bar.  Keep electrical cords out of the way.  Do not use  floor polish or wax that makes floors slippery. If you must use wax, use non-skid floor wax.  Do not have throw rugs and other things on the floor that can make you trip. What can I do with my stairs?  Do not leave any items on the stairs.  Make sure that there are handrails on both sides of the stairs and use them. Fix handrails that are broken or loose. Make sure that handrails are as long as the stairways.  Check any carpeting to make sure that it is firmly attached to the stairs. Fix any carpet that is loose or worn.  Avoid having throw rugs at the top or bottom of the stairs. If you do have throw rugs, attach them to the floor with carpet tape.  Make sure that you have a light switch at the top of the stairs and the bottom of the stairs. If you do not have them, ask someone to add them for you. What else can I do to help prevent falls?  Wear shoes that:  Do not have high heels.  Have rubber bottoms.  Are  comfortable and fit you well.  Are closed at the toe. Do not wear sandals.  If you use a stepladder:  Make sure that it is fully opened. Do not climb a closed stepladder.  Make sure that both sides of the stepladder are locked into place.  Ask someone to hold it for you, if possible.  Clearly mark and make sure that you can see:  Any grab bars or handrails.  First and last steps.  Where the edge of each step is.  Use tools that help you move around (mobility aids) if they are needed. These include:  Canes.  Walkers.  Scooters.  Crutches.  Turn on the lights when you go into a dark area. Replace any light bulbs as soon as they burn out.  Set up your furniture so you have a clear path. Avoid moving your furniture around.  If any of your floors are uneven, fix them.  If there are any pets around you, be aware of where they are.  Review your medicines with your doctor. Some medicines can make you feel dizzy. This can increase your chance of falling. Ask your doctor what other things that you can do to help prevent falls. This information is not intended to replace advice given to you by your health care provider. Make sure you discuss any questions you have with your health care provider. Document Released: 09/09/2009 Document Revised: 04/20/2016 Document Reviewed: 12/18/2014 Elsevier Interactive Patient Education  2017 [employer].

## 2018-09-12 NOTE — Progress Notes (Signed)
Subjective:   Craig York is a 67 y.o. male who presents for an Initial Medicare Annual Wellness Visit.  Review of Systems  N/A  Cardiac Risk Factors include: advanced age (>13men, >47 women);diabetes mellitus;dyslipidemia;hypertension;male gender;sedentary lifestyle    Objective:    Today's Vitals   09/12/18 1349  BP: 136/72  Pulse: 74  Temp: 98.1 F (36.7 C)  TempSrc: Oral  Weight: 208 lb 3.2 oz (94.4 kg)  Height: 6\' 1"  (1.854 m)  PainSc: 0-No pain   Body mass index is 27.47 kg/m.  Advanced Directives 09/12/2018  Does Patient Have a Medical Advance Directive? No  Does patient want to make changes to medical advance directive? No - Patient declined    Current Medications (verified) Outpatient Encounter Medications as of 09/12/2018  Medication Sig  . amLODipine (NORVASC) 10 MG tablet Take 0.5 tablets (5 mg total) by mouth daily.  [name] aspirin 81 MG tablet Take 1 tablet by mouth daily.  [name] atorvastatin (LIPITOR) 40 MG tablet Take 1 tablet (40 mg total) by mouth daily.  . empagliflozin (JARDIANCE) 25 MG TABS tablet Take 25 mg by mouth daily.  . ferrous sulfate 325 (65 FE) MG tablet Take 325 mg by mouth 2 (two) times daily.  [name] glipiZIDE (GLUCOTROL) 10 MG tablet TAKE 1 TABLET BY MOUTH ONCE DAILY  . ibuprofen (ADVIL,MOTRIN) 800 MG tablet TAKE 1 TABLET BY MOUTH EVERY 8 HOURS AS NEEDED WITH FOOD  . metFORMIN (GLUCOPHAGE) 500 MG tablet TAKE TWO TABLETS BY MOUTH TWICE DAILY WITH A MEAL  . oxyCODONE-acetaminophen (PERCOCET) 10-325 MG tablet Take 1 tablet by mouth every 8 (eight) hours as needed for pain.  . sildenafil (REVATIO) 20 MG tablet 3-5 tablets as needed prior to intercourse, not to exceed 5 tablets in a day  . silver sulfADIAZINE (SILVADENE) 1 % cream Apply 1 application topically daily. (Patient taking differently: Apply 1 application topically as needed. )  . tadalafil (CIALIS) 5 MG tablet Take 1-2 tablets (5-10 mg total) by mouth as needed.  . valsartan (DIOVAN) 80  MG tablet Take 1 tablet (80 mg total) by mouth daily.   No facility-administered encounter medications on file as of 09/12/2018.     Allergies (verified) Simvastatin and Penicillins   History: Past Medical History:  Diagnosis Date  . Diabetes mellitus without complication ([place])   . Hyperlipidemia   . Myocardial infarction [facility])    Past Surgical History:  Procedure Laterality Date  . chest surgery for fungal infection    . COLONOSCOPY WITH PROPOFOL N/A 07/10/2017   Procedure: COLONOSCOPY WITH PROPOFOL;  Surgeon: [name], MD;  Location: [facility] ENDOSCOPY;  Service: Endoscopy;  Laterality: N/A;  . ESOPHAGOGASTRODUODENOSCOPY (EGD) WITH PROPOFOL N/A 07/10/2017   Procedure: ESOPHAGOGASTRODUODENOSCOPY (EGD) WITH PROPOFOL;  Surgeon: [name], MD;  Location: [facility] ENDOSCOPY;  Service: Endoscopy;  Laterality: N/A;  . HERNIA REPAIR     Family History  Problem Relation Age of Onset  . Hypertension Mother    Social History   Socioeconomic History  . Marital status: Legally Separated    Spouse name: Not on file  . Number of children: 2  . Years of education: Not on file  . Highest education level: High school graduate  Occupational History  . 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 Financial resource strain: Not hard at all  . Food insecurity:    Worry: Never true    Inability: Never true  . Transportation needs:  Medical: No    Non-medical: No  Tobacco Use  . Smoking status: Former Research scientist (life sciences)  . Smokeless tobacco: Former Systems developer    Types: Chew    Quit date: 11/27/1968  . Tobacco comment: used 2 packs per week; quit over 40 years ago  Substance and Sexual Activity  . Alcohol use: Yes    Alcohol/week: 6.0 standard drinks    Types: 6 Cans of beer per week  . Drug use: No  . Sexual activity: Not on file  Lifestyle  . Physical activity:    Days per week: 3 days    Minutes per session: 30 min  . Stress: Not at all    Relationships  . Social connections:    Talks on phone: Patient refused    Gets together: Patient refused    Attends religious service: Patient refused    Active member of club or organization: Patient refused    Attends meetings of clubs or organizations: Patient refused    Relationship status: Patient refused  Other Topics Concern  . Not on file  Social History Narrative  . Not on file   Tobacco Counseling Counseling given: Not Answered Comment: used 2 packs per week; quit over 40 years ago   Clinical Intake:  Pre-visit preparation completed: Yes  Pain : No/denies pain Pain Score: 0-No pain    Nutrition Risk Assessment:  Has the patient had any N/V/D within the last 2 months?  No  Does the patient have any non-healing wounds?  No  Has the patient had any unintentional weight loss or weight gain?  No   Diabetes:  Is the patient diabetic?  Yes  If diabetic, was a CBG obtained today?  No  Did the patient bring in their glucometer from home?  No  How often do you monitor your CBG's? Twice a week.   Financial Strains and Diabetes Management:  Are you having any financial strains with the device, your supplies or your medication? No .  Does the patient want to be seen by Chronic Care Management for management of their diabetes?  No  Would the patient like to be referred to a Nutritionist or for Diabetic Management?  No   Diabetic Exams:  Diabetic Eye Exam: Completed 02/11/18.  Diabetic Foot Exam: Completed 01/23/13. Pt has been advised about the importance in completing this exam. Needs to be updated at next OV.   How often do you need to have someone help you when you read instructions, pamphlets, or other written materials from your doctor or pharmacy?: 1 - Never  Interpreter Needed?: No  Information entered by :: [facility], LPN  Activities of Daily Living In your present state of health, do you have any difficulty performing the following activities:  09/12/2018 03/05/2018  Hearing? N N  Vision? N N  Difficulty concentrating or making decisions? N N  Walking or climbing stairs? N N  Dressing or bathing? N N  Doing errands, shopping? N N  Preparing Food and eating ? N -  Using the Toilet? N -  In the past six months, have you accidently leaked urine? N -  Do you have problems with loss of bowel control? N -  Managing your Medications? N -  Managing your Finances? N -  Housekeeping or managing your Housekeeping? N -  Some recent data might be hidden     Immunizations and Health Maintenance Immunization History  Administered Date(s) Administered  . Tdap 05/28/2017   Health Maintenance Due  Topic Date  Due  . Hepatitis C Screening  1951/06/09  . FOOT EXAM  11/09/1961  . PNA vac Low Risk Adult (1 of 2 - PCV13) 11/09/2016    Patient Care Team: [name], MD as PCP - General (Family Medicine) [name], MD as Consulting Physician (Cardiology) Troxler, Rodman Key, DPM as Attending Physician (Podiatry)  Indicate any recent Medical Services you may have received from other than [facility] providers in the past year (date may be approximate).    Assessment:   This is a routine wellness examination for Zeb.  Hearing/Vision screen No exam data present  Dietary issues and exercise activities discussed: Current Exercise Habits: Home exercise routine, Type of exercise: walking, Time (Minutes): 35, Frequency (Times/Week): 3, Weekly Exercise (Minutes/Week): 105, Intensity: Mild, Exercise limited by: None identified  Goals   None    Depression Screen PHQ 2/9 Scores 09/12/2018 03/05/2018 02/07/2017 10/06/2015  PHQ - 2 Score 0 0 0 0  PHQ- 9 Score - 0 0 0    Fall Risk Fall Risk  09/12/2018 03/05/2018 02/07/2017  Falls in the past year? No No No   FALL RISK PREVENTION PERTAINING TO THE HOME:  Any stairs in or around the home WITH handrails? No  Home free of loose throw rugs in walkways, pet beds, electrical cords, etc? Yes    Adequate lighting in your home to reduce risk of falls? Yes   ASSISTIVE DEVICES UTILIZED TO PREVENT FALLS:  Life alert? No  Use of a cane, walker or w/c? No  Grab bars in the bathroom? No  Shower chair or bench in shower? No  Elevated toilet seat or a handicapped toilet? No   DME ORDERS:  DME order needed?  No   TIMED UP AND GO:  Was the test performed? No .    Cognitive Function: Declined today.         Screening Tests Health Maintenance  Topic Date Due  . Hepatitis C Screening  1951/05/23  . FOOT EXAM  11/09/1961  . PNA vac Low Risk Adult (1 of 2 - PCV13) 11/09/2016  . INFLUENZA VACCINE  06/28/2019 (Originally 06/27/2018)  . HEMOGLOBIN A1C  02/04/2019  . OPHTHALMOLOGY EXAM  02/12/2019  . COLONOSCOPY  07/10/2022  . TETANUS/TDAP  05/29/2027    Qualifies for Shingles Vaccine? Yes . Due for Shingrix. Education has been provided regarding the importance of this vaccine. Pt has been advised to call insurance company to determine out of pocket expense. Advised may also receive vaccine at local pharmacy or Health Dept. Verbalized acceptance and understanding.  Tdap: Up to date  Flu Vaccine: Due for Flu vaccine. Does the patient want to receive this vaccine today?  No . Education has been provided regarding the importance of this vaccine but still declined. Advised may receive this vaccine at local pharmacy or Health Dept. Aware to provide a copy of the vaccination record if obtained from local pharmacy or Health Dept. Verbalized acceptance and understanding.  Pneumococcal Vaccine: Due for Pneumococcal vaccine. Does the patient want to receive this vaccine today?  No . Education has been provided regarding the importance of this vaccine but still declined. Advised may receive this vaccine at local pharmacy or Health Dept. Aware to provide a copy of the vaccination record if obtained from local pharmacy or Health Dept. Verbalized acceptance and understanding.   Cancer  Screenings:  Colorectal Screening: Completed 07/10/17. Repeat every 5 years.  Lung Cancer Screening: (Low Dose CT Chest recommended if Age 53-80 years, 30 pack-year currently  smoking OR have quit w/in 15years.) does not qualify.   Additional Screening:  Hepatitis C Screening: does qualify; however declines order  Vision Screening: Recommended annual ophthalmology exams for early detection of glaucoma and other disorders of the eye.  Dental Screening: Recommended annual dental exams for proper oral hygiene  Community Resource Referral:  CRR required this visit?  No       Plan:  I have personally reviewed and addressed the Medicare Annual Wellness questionnaire and have noted the following in the patient's chart:  A. Medical and social history B. Use of alcohol, tobacco or illicit drugs  C. Current medications and supplements D. Functional ability and status E.  Nutritional status F.  Physical activity G. Advance directives H. List of other physicians I.  Hospitalizations, surgeries, and ER visits in previous 12 months J.  [place] such as hearing and vision if needed, cognitive and depression L. Referrals and appointments - none  In addition, I have reviewed and discussed with patient certain preventive protocols, quality metrics, and best practice recommendations. A written personalized care plan for preventive services as well as general preventive health recommendations were provided to patient.  See attached scanned questionnaire for additional information.   Signed,  Fabio Neighbors, LPN Nurse Health Advisor   Nurse Recommendations: Pt is due for a diabetic foot exam. Pt declined the Hep C lab, influenza and pneumonia vaccines today.

## 2018-10-10 ENCOUNTER — Other Ambulatory Visit: Payer: Self-pay | Admitting: Family Medicine

## 2018-10-10 DIAGNOSIS — M5416 Radiculopathy, lumbar region: Secondary | ICD-10-CM

## 2018-10-10 MED ORDER — OXYCODONE-ACETAMINOPHEN 10-325 MG PO TABS: 1.0000 | ORAL_TABLET | Freq: Three times a day (TID) | ORAL | 0 refills | Status: DC | PRN

## 2018-10-10 MED ORDER — METFORMIN HCL 500 MG PO TABS: ORAL_TABLET | ORAL | 4 refills | Status: DC

## 2018-10-10 NOTE — Telephone Encounter (Signed)
Pt requesting refill of metFORMIN (GLUCOPHAGE) 500 MG tablet and oxyCODONE-acetaminophen (PERCOCET) 10-325 MG tablet sent to [facility]

## 2018-11-08 ENCOUNTER — Telehealth: Payer: Self-pay | Admitting: Family Medicine

## 2018-11-08 DIAGNOSIS — M5416 Radiculopathy, lumbar region: Secondary | ICD-10-CM

## 2018-11-08 MED ORDER — OXYCODONE-ACETAMINOPHEN 10-325 MG PO TABS: 1.0000 | ORAL_TABLET | Freq: Three times a day (TID) | ORAL | 0 refills | Status: DC | PRN

## 2018-11-08 NOTE — Telephone Encounter (Signed)
Last o.v. 09/12/2018 ,please advise

## 2018-11-08 NOTE — Telephone Encounter (Signed)
Pt needing a refill on:  oxyCODONE-acetaminophen (PERCOCET) 10-325 MG tablet  Please fill at:  [place] (N), [place] [address] [phone] (Phone) [phone] (Fax)   Thanks, [employer]

## 2018-12-09 ENCOUNTER — Other Ambulatory Visit: Payer: Self-pay | Admitting: Family Medicine

## 2018-12-09 DIAGNOSIS — M5416 Radiculopathy, lumbar region: Secondary | ICD-10-CM

## 2018-12-09 MED ORDER — OXYCODONE-ACETAMINOPHEN 10-325 MG PO TABS: 1.0000 | ORAL_TABLET | Freq: Three times a day (TID) | ORAL | 0 refills | Status: DC | PRN

## 2018-12-09 NOTE — Telephone Encounter (Signed)
Patient requesting refill on Oxycodone 10-325 mg. Sent to [facility] on [address]

## 2019-01-09 ENCOUNTER — Other Ambulatory Visit: Payer: Self-pay

## 2019-01-09 DIAGNOSIS — M5416 Radiculopathy, lumbar region: Secondary | ICD-10-CM

## 2019-01-09 MED ORDER — OXYCODONE-ACETAMINOPHEN 10-325 MG PO TABS: 1.0000 | ORAL_TABLET | Freq: Three times a day (TID) | ORAL | 0 refills | Status: DC | PRN

## 2019-02-03 ENCOUNTER — Other Ambulatory Visit: Payer: Self-pay | Admitting: Family Medicine

## 2019-02-03 MED ORDER — GLIPIZIDE 10 MG PO TABS: 10.0000 mg | ORAL_TABLET | Freq: Every day | ORAL | 4 refills | Status: DC

## 2019-02-03 NOTE — Telephone Encounter (Signed)
[facility] faxed refill request for the following medications: ° °glipiZIDE (GLUCOTROL) 10 MG tablet ° ° ° °Please advise. °

## 2019-02-05 ENCOUNTER — Other Ambulatory Visit: Payer: Self-pay | Admitting: Family Medicine

## 2019-02-05 DIAGNOSIS — M5416 Radiculopathy, lumbar region: Secondary | ICD-10-CM

## 2019-02-05 MED ORDER — OXYCODONE-ACETAMINOPHEN 10-325 MG PO TABS: 1.0000 | ORAL_TABLET | Freq: Three times a day (TID) | ORAL | 0 refills | Status: DC | PRN

## 2019-02-05 NOTE — Telephone Encounter (Signed)
pt called saying he needs a refill on  Valsartan 80 mg  Oxycodone 10-325  [place]

## 2019-02-11 ENCOUNTER — Encounter: Payer: Self-pay | Admitting: Family Medicine

## 2019-02-11 ENCOUNTER — Ambulatory Visit (INDEPENDENT_AMBULATORY_CARE_PROVIDER_SITE_OTHER): Payer: Medicare HMO | Admitting: Family Medicine

## 2019-02-11 ENCOUNTER — Other Ambulatory Visit: Payer: Self-pay

## 2019-02-11 VITALS — BP 122/80 | HR 75 | Temp 98.6°F | Resp 16 | Wt 203.0 lb

## 2019-02-11 DIAGNOSIS — M25552 Pain in left hip: Secondary | ICD-10-CM | POA: Diagnosis not present

## 2019-02-11 DIAGNOSIS — I10 Essential (primary) hypertension: Secondary | ICD-10-CM

## 2019-02-11 DIAGNOSIS — Z1159 Encounter for screening for other viral diseases: Secondary | ICD-10-CM | POA: Diagnosis not present

## 2019-02-11 DIAGNOSIS — R809 Proteinuria, unspecified: Secondary | ICD-10-CM | POA: Diagnosis not present

## 2019-02-11 DIAGNOSIS — I2510 Atherosclerotic heart disease of native coronary artery without angina pectoris: Secondary | ICD-10-CM

## 2019-02-11 DIAGNOSIS — M47817 Spondylosis without myelopathy or radiculopathy, lumbosacral region: Secondary | ICD-10-CM

## 2019-02-11 DIAGNOSIS — E1129 Type 2 diabetes mellitus with other diabetic kidney complication: Secondary | ICD-10-CM | POA: Diagnosis not present

## 2019-02-11 DIAGNOSIS — N529 Male erectile dysfunction, unspecified: Secondary | ICD-10-CM

## 2019-02-11 MED ORDER — METHYLPREDNISOLONE ACETATE 40 MG/ML IJ SUSP
40.0000 mg | Freq: Once | INTRAMUSCULAR | Status: AC
  Administered 2019-02-11: 40 mg via INTRAMUSCULAR

## 2019-02-11 MED ORDER — TADALAFIL 20 MG PO TABS: 20.0000 mg | ORAL_TABLET | Freq: Every day | ORAL | 3 refills | Status: DC | PRN

## 2019-02-11 NOTE — Progress Notes (Signed)
Patient: Craig York Male    DOB: 1951-02-24   68 y.o.   MRN: [id number] Visit Date: 02/11/2019  Today's Provider: Lelon Huh, MD   Chief Complaint  Patient presents with  . Diabetes  . Hyperlipidemia  . Hypertension   Subjective:     HPI  Diabetes Mellitus Type II, Follow-up:   Lab Results  Component Value Date   HGBA1C 6.4 (A) 08/06/2018   HGBA1C 6.8 03/05/2018   HGBA1C 6.5 11/06/2017    Last seen for diabetes 6 months ago.  Management since then includes no changes. He reports good compliance with treatment although patient states his medication (Jardiance) is too expensive. He is not having side effects.  Current symptoms include none and have been stable. Home blood sugar records: blood sugars are not checked regularly  Episodes of hypoglycemia? no   Current Insulin Regimen: none Most Recent Eye Exam: 02/11/18 Weight trend: decreasing steadily Prior visit with dietician: No Current exercise: walking Current diet habits: well balanced  Pertinent Labs:    Component Value Date/Time   CHOL 164 08/06/2018 1124   TRIG 112 08/06/2018 1124   HDL 49 08/06/2018 1124   LDLCALC 93 08/06/2018 1124   CREATININE 1.01 08/06/2018 1124    Wt Readings from Last 3 Encounters:  02/11/19 203 lb (92.1 kg)  09/12/18 208 lb 3.2 oz (94.4 kg)  08/06/18 202 lb (91.6 kg)    ------------------------------------------------------------------------  Lipid/Cholesterol, Follow-up:   Last seen for this 6 months ago.  Management changes since that visit include none. . Last Lipid Panel:    Component Value Date/Time   CHOL 164 08/06/2018 1124   TRIG 112 08/06/2018 1124   HDL 49 08/06/2018 1124   CHOLHDL 3.3 08/06/2018 1124   CHOLHDL 5.5 02/05/2008 0355   VLDL 23 02/05/2008 0355   LDLCALC 93 08/06/2018 1124    Risk factors for vascular disease include diabetes mellitus, hypercholesterolemia and hypertension  He reports good compliance with treatment. He  is not having side effects.  Current symptoms include none and have been stable. Weight trend: decreasing steadily Prior visit with dietician: no Current diet: well balanced Current exercise: walking  Wt Readings from Last 3 Encounters:  02/11/19 203 lb (92.1 kg)  09/12/18 208 lb 3.2 oz (94.4 kg)  08/06/18 202 lb (91.6 kg)    -------------------------------------------------------------------  Hypertension, follow-up:  BP Readings from Last 3 Encounters:  02/11/19 122/80  09/12/18 136/72  08/06/18 121/79    He was last seen for hypertension 6 months ago.  BP at that visit was 121/79. Management since that visit includes no changes. He reports good compliance with treatment. He is not having side effects.  He is exercising. He is not adherent to low salt diet.   Outside blood pressures are checked at the pharmacy occasionally. He is experiencing none.  Patient denies chest pain, chest pressure/discomfort, claudication, dyspnea, exertional chest pressure/discomfort, fatigue, irregular heart beat, lower extremity edema, near-syncope, orthopnea, palpitations, paroxysmal nocturnal dyspnea, syncope and tachypnea.   Cardiovascular risk factors include advanced age (older than 27 for men, 59 for women), diabetes mellitus, dyslipidemia, hypertension and male gender.  Use of agents associated with hypertension: NSAIDS.     Weight trend: decreasing steadily Wt Readings from Last 3 Encounters:  02/11/19 203 lb (92.1 kg)  09/12/18 208 lb 3.2 oz (94.4 kg)  08/06/18 202 lb (91.6 kg)    Current diet: well balanced  ------------------------------------------------------------------------  He also complains that the back of his  left hip has been hurting for the last two to three weeks, worse when he walks. Had Depot medrol injection a few times in the past for similar pain which he states worked well and would like to have injection today.    Allergies  Allergen Reactions  .  Simvastatin     Elevated CK  . Penicillins Rash     Current Outpatient Medications:  .  amLODipine (NORVASC) 10 MG tablet, Take 0.5 tablets (5 mg total) by mouth daily., Disp: 3 tablet, Rfl: 4 .  aspirin 81 MG tablet, Take 1 tablet by mouth daily., Disp: , Rfl:  .  atorvastatin (LIPITOR) 40 MG tablet, Take 1 tablet (40 mg total) by mouth daily., Disp: 90 tablet, Rfl: 2 .  empagliflozin (JARDIANCE) 25 MG TABS tablet, Take 25 mg by mouth daily., Disp: 30 tablet, Rfl: 4 .  ferrous sulfate 325 (65 FE) MG tablet, Take 325 mg by mouth 2 (two) times daily., Disp: , Rfl:  .  glipiZIDE (GLUCOTROL) 10 MG tablet, Take 1 tablet (10 mg total) by mouth daily., Disp: 90 tablet, Rfl: 4 .  ibuprofen (ADVIL,MOTRIN) 800 MG tablet, TAKE 1 TABLET BY MOUTH EVERY 8 HOURS AS NEEDED WITH FOOD, Disp: 20 tablet, Rfl: 3 .  metFORMIN (GLUCOPHAGE) 500 MG tablet, TAKE TWO TABLETS BY MOUTH TWICE DAILY WITH A MEAL, Disp: 360 tablet, Rfl: 4 .  oxyCODONE-acetaminophen (PERCOCET) 10-325 MG tablet, Take 1 tablet by mouth every 8 (eight) hours as needed for pain., Disp: 30 tablet, Rfl: 0 .  sildenafil (REVATIO) 20 MG tablet, 3-5 tablets as needed prior to intercourse, not to exceed 5 tablets in a day, Disp: 50 tablet, Rfl: 3 .  silver sulfADIAZINE (SILVADENE) 1 % cream, Apply 1 application topically daily. (Patient taking differently: Apply 1 application topically as needed. ), Disp: 400 g, Rfl: 0 .  tadalafil (CIALIS) 5 MG tablet, Take 1-2 tablets (5-10 mg total) by mouth as needed., Disp: 30 tablet, Rfl: 1 .  valsartan (DIOVAN) 80 MG tablet, Take 1 tablet by mouth once daily, Disp: 90 tablet, Rfl: 4  Review of Systems  Constitutional: Negative for appetite change, chills and fever.  Respiratory: Negative for chest tightness, shortness of breath and wheezing.   Cardiovascular: Negative for chest pain and palpitations.  Gastrointestinal: Negative for abdominal pain, nausea and vomiting.  Musculoskeletal: Positive for  arthralgias (left hip).    Social History   Tobacco Use  . Smoking status: Former Research scientist (life sciences)  . Smokeless tobacco: Former Systems developer    Types: Chew    Quit date: 11/27/1968  . Tobacco comment: used 2 packs per week; quit over 40 years ago  Substance Use Topics  . Alcohol use: Yes    Alcohol/week: 6.0 standard drinks    Types: 6 Cans of beer per week      Objective:   BP 122/80 (BP Location: Left Arm, Patient Position: Sitting, Cuff Size: Large)   Pulse 75   Temp 98.6 F (37 C) (Oral)   Resp 16   Wt 203 lb (92.1 kg)   SpO2 96% Comment: room air  BMI 26.78 kg/m  Vitals:   02/11/19 1103  BP: 122/80  Pulse: 75  Resp: 16  Temp: 98.6 F (37 C)  TempSrc: Oral  SpO2: 96%  Weight: 203 lb (92.1 kg)     Physical Exam   General Appearance:    Alert, cooperative, no distress  Eyes:    PERRL, conjunctiva/corneas clear, EOM's intact       Lungs:  Clear to auscultation bilaterally, respirations unlabored  Heart:    Regular rate and rhythm  Neurologic:   Awake, alert, oriented x 3. No apparent focal neurological           defect.   MS:    Tender left posterior hip.        Assessment & Plan    1. Type 2 diabetes mellitus with microalbuminuria, without long-term current use of insulin (HCC) Doing well current meds, bur Jardiance is expesive, given 4 sample bottles today.  - Hemoglobin A1c - Renal function panel  2. Coronary artery disease involving native coronary artery of native heart without angina pectoris Asymptomatic. Compliant with medication.  Continue aggressive risk factor modification.  Continue routine cardiology follow up .  - Renal function panel  3. Essential (primary) hypertension Well controlled.  Continue current medications.    4. Need for hepatitis C screening test - Hepatitis C antibody  5. Left hip pain Did well with steriod injection in past.  - methylPREDNISolone acetate (DEPO-MEDROL) injection 40 mg IM - methylPREDNISolone acetate (DEPO-MEDROL)  injection 40 mg IM  6. Spondylosis of lumbosacral region without myelopathy or radiculopathy   7. ED (erectile dysfunction) of organic origin  - tadalafil (CIALIS) 20 MG tablet; Take 1 tablet (20 mg total) by mouth daily as needed for erectile dysfunction.  Dispense: 30 tablet; Refill: 3     Lelon Huh, MD  [facility]

## 2019-02-11 NOTE — Patient Instructions (Addendum)
.   Please review the attached list of medications and notify my office if there are any errors.   . Please bring all of your medications to every appointment so we can make sure that our medication list is the same as yours.    The CDC recommends two doses of Shingrix (the shingles vaccine) separated by 2 to 6 months for adults age 68 years and older. I recommend checking with your insurance plan regarding coverage for this vaccine.    [name] You can install the GoodRx app on your smart phone to find the lowest prices for generic medications.

## 2019-02-12 ENCOUNTER — Telehealth: Payer: Self-pay

## 2019-02-12 LAB — HEMOGLOBIN A1C
Est. average glucose Bld gHb Est-mCnc: 146 mg/dL
Hgb A1c MFr Bld: 6.7 % — ABNORMAL HIGH (ref 4.8–5.6)

## 2019-02-12 LAB — RENAL FUNCTION PANEL
Albumin: 4.6 g/dL (ref 3.8–4.8)
BUN/Creatinine Ratio: 15 (ref 10–24)
BUN: 15 mg/dL (ref 8–27)
CO2: 23 mmol/L (ref 20–29)
Calcium: 10.2 mg/dL (ref 8.6–10.2)
Chloride: 106 mmol/L (ref 96–106)
Creatinine, Ser: 0.97 mg/dL (ref 0.76–1.27)
GFR, EST AFRICAN AMERICAN: 93 mL/min/{1.73_m2} (ref >59)
GFR, EST NON AFRICAN AMERICAN: 80 mL/min/{1.73_m2} (ref >59)
Glucose: 104 mg/dL — ABNORMAL HIGH (ref 65–99)
Phosphorus: 3.5 mg/dL (ref 2.8–4.1)
Potassium: 4.3 mmol/L (ref 3.5–5.2)
Sodium: 143 mmol/L (ref 134–144)

## 2019-02-12 LAB — HEPATITIS C ANTIBODY: Hep C Virus Ab: 0.1 s/co ratio (ref 0.0–0.9)

## 2019-02-12 NOTE — Telephone Encounter (Signed)
Pt advised.  Apt made for 08/20/2019 at 9am  Thanks,   -Mickel Baas

## 2019-02-12 NOTE — Telephone Encounter (Signed)
-----   Message from [name], MD sent at 02/12/2019 12:39 PM EDT ----- Labs are all good. Continue current medications.  Schedule follow up diabetes and blood pressure 6 months.

## 2019-03-06 ENCOUNTER — Other Ambulatory Visit: Payer: Self-pay

## 2019-03-06 DIAGNOSIS — M5416 Radiculopathy, lumbar region: Secondary | ICD-10-CM

## 2019-03-06 MED ORDER — OXYCODONE-ACETAMINOPHEN 10-325 MG PO TABS: 1.0000 | ORAL_TABLET | Freq: Three times a day (TID) | ORAL | 0 refills | Status: DC | PRN

## 2019-03-06 NOTE — Telephone Encounter (Signed)
Please Review

## 2019-03-25 ENCOUNTER — Other Ambulatory Visit: Payer: Self-pay | Admitting: Family Medicine

## 2019-03-25 MED ORDER — EMPAGLIFLOZIN 25 MG PO TABS: 25.0000 mg | ORAL_TABLET | Freq: Every day | ORAL | 4 refills | Status: DC

## 2019-03-25 NOTE — Telephone Encounter (Signed)
Dr. Caryn Section, there was question as to whether the patient was still taking this medication. Is it ok to refill? It looks like we gave him samples of Jardiance on 02/11/2019 because it was expensive.

## 2019-03-25 NOTE — Telephone Encounter (Signed)
[facility] faxed refill request for the following medications:  empagliflozin (JARDIANCE) 25 MG TABS tablet  Please clarify if pt is still taking; if so, please send refill.  Please advise.  Thanks, [employer]

## 2019-04-04 ENCOUNTER — Other Ambulatory Visit: Payer: Self-pay

## 2019-04-04 DIAGNOSIS — M5416 Radiculopathy, lumbar region: Secondary | ICD-10-CM

## 2019-04-04 MED ORDER — EMPAGLIFLOZIN 25 MG PO TABS: 25.0000 mg | ORAL_TABLET | Freq: Every day | ORAL | 4 refills | Status: DC

## 2019-04-04 MED ORDER — OXYCODONE-ACETAMINOPHEN 10-325 MG PO TABS: 1.0000 | ORAL_TABLET | Freq: Three times a day (TID) | ORAL | 0 refills | Status: DC | PRN

## 2019-04-04 NOTE — Telephone Encounter (Signed)
Patient is requesting a refill on oxyCODONE-acetaminophen (PERCOCET) 10-325 MG tablet and Jardiance be sent to [facility].

## 2019-04-04 NOTE — Telephone Encounter (Signed)
Please review. Thanks!  

## 2019-04-29 DIAGNOSIS — E113393 Type 2 diabetes mellitus with moderate nonproliferative diabetic retinopathy without macular edema, bilateral: Secondary | ICD-10-CM | POA: Diagnosis not present

## 2019-04-29 DIAGNOSIS — H524 Presbyopia: Secondary | ICD-10-CM | POA: Diagnosis not present

## 2019-05-05 ENCOUNTER — Other Ambulatory Visit: Payer: Self-pay

## 2019-05-05 DIAGNOSIS — M5416 Radiculopathy, lumbar region: Secondary | ICD-10-CM

## 2019-05-05 MED ORDER — OXYCODONE-ACETAMINOPHEN 10-325 MG PO TABS: 1.0000 | ORAL_TABLET | Freq: Three times a day (TID) | ORAL | 0 refills | Status: DC | PRN

## 2019-05-06 ENCOUNTER — Telehealth: Payer: Self-pay | Admitting: Family Medicine

## 2019-05-06 NOTE — Telephone Encounter (Signed)
[facility] called saying patient came and picked up 30 days of Diovan this drug has been I short supply.  30 days only until it is resolved.  They can substitute Losartan  Please advise [phone]

## 2019-05-06 NOTE — Telephone Encounter (Signed)
Will check with pharmacy in July to see if supply is still limited.

## 2019-06-03 NOTE — Telephone Encounter (Signed)
Please check with pharmacy to see if they were able to get more of the valsartan, was apparently only given 30 day supply instead when filled in June. If unable to get 90 day prescription we can change to another ARB.

## 2019-06-04 ENCOUNTER — Other Ambulatory Visit: Payer: Self-pay

## 2019-06-04 ENCOUNTER — Telehealth: Payer: Self-pay | Admitting: Family Medicine

## 2019-06-04 DIAGNOSIS — M5416 Radiculopathy, lumbar region: Secondary | ICD-10-CM

## 2019-06-04 MED ORDER — OXYCODONE-ACETAMINOPHEN 10-325 MG PO TABS: 1.0000 | ORAL_TABLET | Freq: Three times a day (TID) | ORAL | 0 refills | Status: DC | PRN

## 2019-06-04 MED ORDER — LOSARTAN POTASSIUM 100 MG PO TABS: 100.0000 mg | ORAL_TABLET | Freq: Every day | ORAL | 3 refills | Status: DC

## 2019-06-04 NOTE — Telephone Encounter (Signed)
I called and spoke with pharmacist who advised me that they are still out of stock on Valsartan. They don't know when they will be getting a shipment.

## 2019-06-04 NOTE — Telephone Encounter (Signed)
Please advise. Patient is aware that Dr. Caryn Section is out of the office until Friday 06/06/2019. Patient says he is going to run out of medication on Saturday (06/07/2019). He has 3 pills left.

## 2019-06-04 NOTE — Telephone Encounter (Signed)
Pt's Valsartan is out of stock and on nation wide back order.  What can he use in place of it  Please advise [facility]

## 2019-06-04 NOTE — Telephone Encounter (Signed)
Patient request refill

## 2019-06-23 ENCOUNTER — Telehealth: Payer: Self-pay | Admitting: Family Medicine

## 2019-06-23 MED ORDER — LOSARTAN POTASSIUM 25 MG PO TABS: 25.0000 mg | ORAL_TABLET | Freq: Every day | ORAL | 0 refills | Status: DC

## 2019-06-23 NOTE — Telephone Encounter (Signed)
What is is his blood pressure running?

## 2019-06-23 NOTE — Telephone Encounter (Signed)
Will change to 25mg  tablets and just take one a day. Have sent prescription to his pharmacy. Check BP once a week, call if he sees any BP over 140.

## 2019-06-23 NOTE — Telephone Encounter (Signed)
Pt called back to check on the medication question .  He said he took it this morning at the pharmacy and it was 118/71.  Still feeling a little dizzy when he gets up.  No chest pains.  teri

## 2019-06-23 NOTE — Telephone Encounter (Signed)
Patient advised and verbally voiced understanding.  

## 2019-06-23 NOTE — Telephone Encounter (Signed)
Pt called saying the new blood pressure pills that he started taking last week makes him dizzy and light headed.  He said they were too strong.  He talked to the pharmacy and they told him to take 1/2 but he says it's still too strong  He does not know the name of the medication  Please advise  CB#  [phone]  Thanks  teri

## 2019-06-30 ENCOUNTER — Encounter: Payer: Self-pay | Admitting: Family Medicine

## 2019-06-30 ENCOUNTER — Ambulatory Visit (INDEPENDENT_AMBULATORY_CARE_PROVIDER_SITE_OTHER): Payer: Medicare HMO | Admitting: Family Medicine

## 2019-06-30 ENCOUNTER — Other Ambulatory Visit: Payer: Self-pay

## 2019-06-30 VITALS — BP 132/73 | HR 79 | Temp 98.7°F | Wt 200.0 lb

## 2019-06-30 DIAGNOSIS — E785 Hyperlipidemia, unspecified: Secondary | ICD-10-CM | POA: Diagnosis not present

## 2019-06-30 DIAGNOSIS — I2510 Atherosclerotic heart disease of native coronary artery without angina pectoris: Secondary | ICD-10-CM

## 2019-06-30 DIAGNOSIS — R809 Proteinuria, unspecified: Secondary | ICD-10-CM

## 2019-06-30 DIAGNOSIS — I10 Essential (primary) hypertension: Secondary | ICD-10-CM | POA: Diagnosis not present

## 2019-06-30 DIAGNOSIS — N529 Male erectile dysfunction, unspecified: Secondary | ICD-10-CM

## 2019-06-30 DIAGNOSIS — D509 Iron deficiency anemia, unspecified: Secondary | ICD-10-CM

## 2019-06-30 DIAGNOSIS — R079 Chest pain, unspecified: Secondary | ICD-10-CM | POA: Diagnosis not present

## 2019-06-30 DIAGNOSIS — E1129 Type 2 diabetes mellitus with other diabetic kidney complication: Secondary | ICD-10-CM

## 2019-06-30 MED ORDER — METOPROLOL SUCCINATE ER 25 MG PO TB24: 25.0000 mg | ORAL_TABLET | Freq: Every day | ORAL | 1 refills | Status: DC

## 2019-06-30 MED ORDER — SILDENAFIL CITRATE 100 MG PO TABS: 50.0000 mg | ORAL_TABLET | Freq: Every day | ORAL | 11 refills | Status: DC | PRN

## 2019-06-30 NOTE — Patient Instructions (Addendum)
.   Please review the attached list of medications and notify my office if there are any errors.   . Please bring all of your medications to every appointment so we can make sure that our medication list is the same as yours.   . We will have flu vaccines available after Labor Day. Please go to your pharmacy or call the office in early September to schedule you flu shot.   Stop taking losartan and start metoprolol ER 25mg  once a day

## 2019-06-30 NOTE — Progress Notes (Signed)
Patient: Craig York Male    DOB: Dec 25, 1950   68 y.o.   MRN: [id number] Visit Date: 06/30/2019  Today's Provider: Lelon Huh, MD   Chief Complaint  Patient presents with  . Hypertension   Subjective:     HPI     Hypertension, follow-up:  BP Readings from Last 3 Encounters:  06/30/19 132/73  02/11/19 122/80  09/12/18 136/72    He was last seen for hypertension 4 months ago.  BP at that visit was 122/80. Management since that visit includes switching from Valsartan to Losartan 100mg .  Pt called back 06/23/2019 complaining of dizziness.  His Losartan was dropped to 25mg  a day. He reports excellent compliance with treatment. He is having side effects.  Pt reports the dizziness has improved some but has not gone away.  He is not exercising.  Pt states he usually walks but has been having too many symptoms. He is adherent to low salt diet.   Outside blood pressures are running low.  Cardiovascular risk factors include advanced age (older than 77 for men, 90 for women), diabetes mellitus, hypertension and male gender.  Use of agents associated with hypertension: none.     Weight trend: stable Wt Readings from Last 3 Encounters:  06/30/19 200 lb (90.7 kg)  02/11/19 203 lb (92.1 kg)  09/12/18 208 lb 3.2 oz (94.4 kg)    Current diet: in general, a "healthy" diet    ------------------------------------------------------------------------    Allergies  Allergen Reactions  . Simvastatin     Elevated CK  . Penicillins Rash     Current Outpatient Medications:  .  amLODipine (NORVASC) 10 MG tablet, Take 0.5 tablets (5 mg total) by mouth daily., Disp: 3 tablet, Rfl: 4 .  aspirin 81 MG tablet, Take 1 tablet by mouth daily., Disp: , Rfl:  .  atorvastatin (LIPITOR) 40 MG tablet, Take 1 tablet (40 mg total) by mouth daily., Disp: 90 tablet, Rfl: 2 .  empagliflozin (JARDIANCE) 25 MG TABS tablet, Take 25 mg by mouth daily., Disp: 30 tablet, Rfl: 4 .  ferrous  sulfate 325 (65 FE) MG tablet, Take 325 mg by mouth 2 (two) times daily., Disp: , Rfl:  .  glipiZIDE (GLUCOTROL) 10 MG tablet, Take 1 tablet (10 mg total) by mouth daily., Disp: 90 tablet, Rfl: 4 .  ibuprofen (ADVIL,MOTRIN) 800 MG tablet, TAKE 1 TABLET BY MOUTH EVERY 8 HOURS AS NEEDED WITH FOOD, Disp: 20 tablet, Rfl: 3 .  losartan (COZAAR) 25 MG tablet, Take 1 tablet (25 mg total) by mouth daily., Disp: 30 tablet, Rfl: 0 .  metFORMIN (GLUCOPHAGE) 500 MG tablet, TAKE TWO TABLETS BY MOUTH TWICE DAILY WITH A MEAL, Disp: 360 tablet, Rfl: 4 .  oxyCODONE-acetaminophen (PERCOCET) 10-325 MG tablet, Take 1 tablet by mouth every 8 (eight) hours as needed for pain., Disp: 30 tablet, Rfl: 0 .  silver sulfADIAZINE (SILVADENE) 1 % cream, Apply 1 application topically daily. (Patient taking differently: Apply 1 application topically as needed. ), Disp: 400 g, Rfl: 0 .  tadalafil (CIALIS) 20 MG tablet, Take 1 tablet (20 mg total) by mouth daily as needed for erectile dysfunction., Disp: 30 tablet, Rfl: 3  Review of Systems  Constitutional: Negative for activity change, appetite change, chills, diaphoresis, fatigue, fever and unexpected weight change.  HENT: Negative for congestion, ear discharge, ear pain, postnasal drip, rhinorrhea, sinus pressure, sinus pain, sneezing and sore throat.   Respiratory: Positive for cough, chest tightness and shortness of breath.  Negative for apnea, choking, wheezing and stridor.   Cardiovascular: Negative for chest pain, palpitations and leg swelling.  Gastrointestinal: Positive for blood in stool (Started last week.). Negative for abdominal distention, abdominal pain, anal bleeding, constipation, diarrhea, nausea, rectal pain and vomiting.  Musculoskeletal: Negative.   Neurological: Positive for dizziness, light-headedness and headaches.    Social History   Tobacco Use  . Smoking status: Former Research scientist (life sciences)  . Smokeless tobacco: Former Systems developer    Types: Chew    Quit date: 11/27/1968   . Tobacco comment: used 2 packs per week; quit over 40 years ago  Substance Use Topics  . Alcohol use: Yes    Alcohol/week: 6.0 standard drinks    Types: 6 Cans of beer per week      Objective:   BP 132/73 (BP Location: Right Arm, Patient Position: Sitting, Cuff Size: Normal)   Pulse 79   Temp 98.7 F (37.1 C) (Oral)   Wt 200 lb (90.7 kg)   SpO2 98%   BMI 26.39 kg/m  Vitals:   06/30/19 1557  BP: 132/73  Pulse: 79  Temp: 98.7 F (37.1 C)  TempSrc: Oral  SpO2: 98%  Weight: 200 lb (90.7 kg)     Physical Exam   General Appearance:    Alert, cooperative, no distress  Eyes:    PERRL, conjunctiva/corneas clear, EOM's intact       Lungs:     Clear to auscultation bilaterally, respirations unlabored  Heart:    Normal heart rate. Normal rhythm. No murmurs, rubs, or gallops.   MS:   All extremities are intact.   Neurologic:   Awake, alert, oriented x 3. No apparent focal neurological           defect.          Assessment & Plan    1. Chest pain, unspecified type Need to re-establish with cardiologist.  - Ambulatory referral to Cardiology  2. Coronary artery disease involving native coronary artery of native heart without angina pectoris   3. Essential (primary) hypertension Consider onset of angina, will start - metoprolol succinate (TOPROL-XL) 25 MG 24 hr tablet; Take 1 tablet (25 mg total) by mouth daily.  Dispense: 30 tablet; Refill: 1  4. Type 2 diabetes mellitus with microalbuminuria, without long-term current use of insulin (HCC)  - Hemoglobin A1c  5. Hyperlipidemia, unspecified hyperlipidemia type He is tolerating atorvastatin well with no adverse effects.   - Comprehensive metabolic panel - Lipid panel  6. Iron deficiency anemia, unspecified iron deficiency anemia type  - CBC  7. Erectile dysfunction, unspecified erectile dysfunction type  - sildenafil (VIAGRA) 100 MG tablet; Take 0.5-1 tablets (50-100 mg total) by mouth daily as needed for erectile  dysfunction.  Dispense: 10 tablet; Refill: 11  The entirety of the information documented in the History of Present Illness, Review of Systems and Physical Exam were personally obtained by me. Portions of this information were initially documented by Ashley Royalty, CMA and reviewed by me for thoroughness and accuracy.       Lelon Huh, MD  [facility]

## 2019-07-01 ENCOUNTER — Other Ambulatory Visit: Payer: Self-pay | Admitting: Family Medicine

## 2019-07-01 DIAGNOSIS — M5416 Radiculopathy, lumbar region: Secondary | ICD-10-CM

## 2019-07-01 DIAGNOSIS — E1129 Type 2 diabetes mellitus with other diabetic kidney complication: Secondary | ICD-10-CM | POA: Diagnosis not present

## 2019-07-01 DIAGNOSIS — D509 Iron deficiency anemia, unspecified: Secondary | ICD-10-CM | POA: Diagnosis not present

## 2019-07-01 DIAGNOSIS — R809 Proteinuria, unspecified: Secondary | ICD-10-CM | POA: Diagnosis not present

## 2019-07-01 DIAGNOSIS — D539 Nutritional anemia, unspecified: Secondary | ICD-10-CM | POA: Diagnosis not present

## 2019-07-01 DIAGNOSIS — E785 Hyperlipidemia, unspecified: Secondary | ICD-10-CM | POA: Diagnosis not present

## 2019-07-01 MED ORDER — OXYCODONE-ACETAMINOPHEN 10-325 MG PO TABS: 1.0000 | ORAL_TABLET | Freq: Three times a day (TID) | ORAL | 0 refills | Status: DC | PRN

## 2019-07-01 NOTE — Telephone Encounter (Signed)
Patient requesting refill on Oxycodone sent to [facility] on [address].

## 2019-07-02 LAB — COMPREHENSIVE METABOLIC PANEL
ALT: 22 IU/L (ref 0–44)
AST: 19 IU/L (ref 0–40)
Albumin/Globulin Ratio: 1.9 (ref 1.2–2.2)
Albumin: 4.4 g/dL (ref 3.8–4.8)
Alkaline Phosphatase: 59 IU/L (ref 39–117)
BUN/Creatinine Ratio: 14 (ref 10–24)
BUN: 13 mg/dL (ref 8–27)
Bilirubin Total: 0.5 mg/dL (ref 0.0–1.2)
CO2: 19 mmol/L — ABNORMAL LOW (ref 20–29)
Calcium: 9.4 mg/dL (ref 8.6–10.2)
Chloride: 103 mmol/L (ref 96–106)
Creatinine, Ser: 0.93 mg/dL (ref 0.76–1.27)
GFR calc Af Amer: 98 mL/min/{1.73_m2} (ref >59)
GFR calc non Af Amer: 85 mL/min/{1.73_m2} (ref >59)
Globulin, Total: 2.3 g/dL (ref 1.5–4.5)
Glucose: 128 mg/dL — ABNORMAL HIGH (ref 65–99)
Potassium: 4.3 mmol/L (ref 3.5–5.2)
Sodium: 139 mmol/L (ref 134–144)
Total Protein: 6.7 g/dL (ref 6.0–8.5)

## 2019-07-02 LAB — CBC
Hematocrit: 26.7 % — ABNORMAL LOW (ref 37.5–51.0)
Hemoglobin: 8.6 g/dL — ABNORMAL LOW (ref 13.0–17.7)
MCH: 26.3 pg — ABNORMAL LOW (ref 26.6–33.0)
MCHC: 32.2 g/dL (ref 31.5–35.7)
MCV: 82 fL (ref 79–97)
Platelets: 251 10*3/uL (ref 150–450)
RBC: 3.27 x10E6/uL — ABNORMAL LOW (ref 4.14–5.80)
RDW: 14.1 % (ref 11.6–15.4)
WBC: 6.2 10*3/uL (ref 3.4–10.8)

## 2019-07-02 LAB — HEMOGLOBIN A1C
Est. average glucose Bld gHb Est-mCnc: 137 mg/dL
Hgb A1c MFr Bld: 6.4 % — ABNORMAL HIGH (ref 4.8–5.6)

## 2019-07-02 LAB — LIPID PANEL
Chol/HDL Ratio: 3.3 ratio (ref 0.0–5.0)
Cholesterol, Total: 137 mg/dL (ref 100–199)
HDL: 41 mg/dL (ref >39)
LDL Calculated: 72 mg/dL (ref 0–99)
Triglycerides: 120 mg/dL (ref 0–149)
VLDL Cholesterol Cal: 24 mg/dL (ref 5–40)

## 2019-07-03 DIAGNOSIS — R011 Cardiac murmur, unspecified: Secondary | ICD-10-CM | POA: Diagnosis not present

## 2019-07-03 DIAGNOSIS — I10 Essential (primary) hypertension: Secondary | ICD-10-CM | POA: Diagnosis not present

## 2019-07-03 DIAGNOSIS — R079 Chest pain, unspecified: Secondary | ICD-10-CM | POA: Diagnosis not present

## 2019-07-03 DIAGNOSIS — E119 Type 2 diabetes mellitus without complications: Secondary | ICD-10-CM | POA: Diagnosis not present

## 2019-07-03 DIAGNOSIS — R0602 Shortness of breath: Secondary | ICD-10-CM | POA: Diagnosis not present

## 2019-07-03 DIAGNOSIS — I208 Other forms of angina pectoris: Secondary | ICD-10-CM | POA: Diagnosis not present

## 2019-07-03 DIAGNOSIS — E782 Mixed hyperlipidemia: Secondary | ICD-10-CM | POA: Diagnosis not present

## 2019-07-03 DIAGNOSIS — I2510 Atherosclerotic heart disease of native coronary artery without angina pectoris: Secondary | ICD-10-CM | POA: Diagnosis not present

## 2019-07-05 ENCOUNTER — Encounter: Payer: Self-pay | Admitting: Family Medicine

## 2019-07-05 DIAGNOSIS — Z86010 Personal history of colonic polyps: Secondary | ICD-10-CM | POA: Insufficient documentation

## 2019-07-07 ENCOUNTER — Telehealth: Payer: Self-pay

## 2019-07-07 NOTE — Telephone Encounter (Signed)
Pt advised.   Thanks,   -Kayliegh Boyers  

## 2019-07-07 NOTE — Telephone Encounter (Signed)
-----   Message from [name], MD sent at 07/05/2019  9:37 AM EDT ----- Iron level has dropped from 49 when last checked to 6 now. Normal is > 30. Needs to start on daily iron sulfate 325mg  tablet. Recheck levels in a month. Will call when time to recheck.

## 2019-07-14 DIAGNOSIS — I10 Essential (primary) hypertension: Secondary | ICD-10-CM | POA: Diagnosis not present

## 2019-07-14 DIAGNOSIS — I208 Other forms of angina pectoris: Secondary | ICD-10-CM | POA: Diagnosis not present

## 2019-07-14 DIAGNOSIS — E782 Mixed hyperlipidemia: Secondary | ICD-10-CM | POA: Diagnosis not present

## 2019-07-14 DIAGNOSIS — R011 Cardiac murmur, unspecified: Secondary | ICD-10-CM | POA: Diagnosis not present

## 2019-07-14 DIAGNOSIS — I2510 Atherosclerotic heart disease of native coronary artery without angina pectoris: Secondary | ICD-10-CM | POA: Diagnosis not present

## 2019-07-14 DIAGNOSIS — R0602 Shortness of breath: Secondary | ICD-10-CM | POA: Diagnosis not present

## 2019-07-14 DIAGNOSIS — Z01818 Encounter for other preprocedural examination: Secondary | ICD-10-CM | POA: Diagnosis not present

## 2019-07-14 DIAGNOSIS — E119 Type 2 diabetes mellitus without complications: Secondary | ICD-10-CM | POA: Diagnosis not present

## 2019-07-15 LAB — VITAMIN B12: Vitamin B-12: 462 pg/mL (ref 232–1245)

## 2019-07-15 LAB — IRON AND TIBC
Iron Saturation: 4 % — CL (ref 15–55)
Iron: 17 ug/dL — ABNORMAL LOW (ref 38–169)
Total Iron Binding Capacity: 469 ug/dL — ABNORMAL HIGH (ref 250–450)
UIBC: 452 ug/dL — ABNORMAL HIGH (ref 111–343)

## 2019-07-15 LAB — FOLATE: Folate: 16 ng/mL (ref >3.0)

## 2019-07-15 LAB — FERRITIN: Ferritin: 6 ng/mL — ABNORMAL LOW (ref 30–400)

## 2019-07-15 LAB — SPECIMEN STATUS REPORT

## 2019-07-17 ENCOUNTER — Other Ambulatory Visit
Admission: RE | Admit: 2019-07-17 | Discharge: 2019-07-17 | Disposition: A | Discharge status: Home / Self Care | Payer: Medicare HMO | Source: Ambulatory Visit | Attending: Internal Medicine | Admitting: Internal Medicine

## 2019-07-17 ENCOUNTER — Other Ambulatory Visit: Payer: Self-pay

## 2019-07-17 DIAGNOSIS — Z20828 Contact with and (suspected) exposure to other viral communicable diseases: Secondary | ICD-10-CM | POA: Insufficient documentation

## 2019-07-17 DIAGNOSIS — Z01812 Encounter for preprocedural laboratory examination: Secondary | ICD-10-CM | POA: Diagnosis not present

## 2019-07-17 LAB — SARS CORONAVIRUS 2 (TAT 6-24 HRS): SARS Coronavirus 2: NEGATIVE

## 2019-07-21 ENCOUNTER — Encounter
Admission: RE | Disposition: A | Discharge status: Home / Self Care | Payer: Self-pay | Source: Home / Self Care | Attending: Internal Medicine

## 2019-07-21 ENCOUNTER — Other Ambulatory Visit: Payer: Self-pay

## 2019-07-21 ENCOUNTER — Encounter: Payer: Self-pay | Admitting: *Deleted

## 2019-07-21 ENCOUNTER — Ambulatory Visit
Admission: RE | Admit: 2019-07-21 | Discharge: 2019-07-21 | Disposition: A | Discharge status: Home / Self Care | Payer: Medicare HMO | Attending: Internal Medicine | Admitting: Internal Medicine

## 2019-07-21 DIAGNOSIS — N400 Enlarged prostate without lower urinary tract symptoms: Secondary | ICD-10-CM | POA: Insufficient documentation

## 2019-07-21 DIAGNOSIS — R011 Cardiac murmur, unspecified: Secondary | ICD-10-CM | POA: Diagnosis not present

## 2019-07-21 DIAGNOSIS — E119 Type 2 diabetes mellitus without complications: Secondary | ICD-10-CM | POA: Diagnosis not present

## 2019-07-21 DIAGNOSIS — Z880 Allergy status to penicillin: Secondary | ICD-10-CM | POA: Diagnosis not present

## 2019-07-21 DIAGNOSIS — E785 Hyperlipidemia, unspecified: Secondary | ICD-10-CM | POA: Insufficient documentation

## 2019-07-21 DIAGNOSIS — Z79899 Other long term (current) drug therapy: Secondary | ICD-10-CM | POA: Insufficient documentation

## 2019-07-21 DIAGNOSIS — Z7984 Long term (current) use of oral hypoglycemic drugs: Secondary | ICD-10-CM | POA: Diagnosis not present

## 2019-07-21 DIAGNOSIS — I2510 Atherosclerotic heart disease of native coronary artery without angina pectoris: Secondary | ICD-10-CM

## 2019-07-21 DIAGNOSIS — Z8249 Family history of ischemic heart disease and other diseases of the circulatory system: Secondary | ICD-10-CM | POA: Diagnosis not present

## 2019-07-21 DIAGNOSIS — Z833 Family history of diabetes mellitus: Secondary | ICD-10-CM | POA: Diagnosis not present

## 2019-07-21 DIAGNOSIS — I10 Essential (primary) hypertension: Secondary | ICD-10-CM | POA: Insufficient documentation

## 2019-07-21 DIAGNOSIS — Z7982 Long term (current) use of aspirin: Secondary | ICD-10-CM | POA: Diagnosis not present

## 2019-07-21 DIAGNOSIS — I25110 Atherosclerotic heart disease of native coronary artery with unstable angina pectoris: Secondary | ICD-10-CM | POA: Diagnosis not present

## 2019-07-21 HISTORY — PX: LEFT HEART CATH AND CORONARY ANGIOGRAPHY: CATH118249

## 2019-07-21 LAB — GLUCOSE, CAPILLARY
Glucose-Capillary: 87 mg/dL (ref 70–99)
Glucose-Capillary: 74 mg/dL (ref 70–99)

## 2019-07-21 SURGERY — LEFT HEART CATH AND CORONARY ANGIOGRAPHY
Anesthesia: Moderate Sedation

## 2019-07-21 SURGERY — LEFT HEART CATH AND CORONARY ANGIOGRAPHY
Anesthesia: Moderate Sedation | Laterality: Left

## 2019-07-21 MED ORDER — VERAPAMIL HCL 2.5 MG/ML IV SOLN
INTRAVENOUS | Status: AC
  Filled 2019-07-21: qty 2

## 2019-07-21 MED ORDER — MIDAZOLAM HCL 2 MG/2ML IJ SOLN
INTRAMUSCULAR | Status: AC
  Filled 2019-07-21: qty 2

## 2019-07-21 MED ORDER — HEPARIN SODIUM (PORCINE) 1000 UNIT/ML IJ SOLN
INTRAMUSCULAR | Status: DC | PRN
  Administered 2019-07-21: 4600 [IU] via INTRAVENOUS

## 2019-07-21 MED ORDER — SODIUM CHLORIDE 0.9 % IV SOLN: 250.0000 mL | INTRAVENOUS | Status: DC | PRN

## 2019-07-21 MED ORDER — FENTANYL CITRATE (PF) 100 MCG/2ML IJ SOLN
INTRAMUSCULAR | Status: DC | PRN
  Administered 2019-07-21: 25 ug via INTRAVENOUS

## 2019-07-21 MED ORDER — HEPARIN SODIUM (PORCINE) 1000 UNIT/ML IJ SOLN
INTRAMUSCULAR | Status: AC
  Filled 2019-07-21: qty 1

## 2019-07-21 MED ORDER — ONDANSETRON HCL 4 MG/2ML IJ SOLN: 4.0000 mg | Freq: Four times a day (QID) | INTRAMUSCULAR | Status: DC | PRN

## 2019-07-21 MED ORDER — VERAPAMIL HCL 2.5 MG/ML IV SOLN
INTRAVENOUS | Status: DC | PRN
  Administered 2019-07-21: 2.5 mg via INTRA_ARTERIAL

## 2019-07-21 MED ORDER — IOHEXOL 300 MG/ML  SOLN
INTRAMUSCULAR | Status: DC | PRN
  Administered 2019-07-21: 15:00:00 90 mL via INTRA_ARTERIAL

## 2019-07-21 MED ORDER — ACETAMINOPHEN 325 MG PO TABS: 650.0000 mg | ORAL_TABLET | ORAL | Status: DC | PRN

## 2019-07-21 MED ORDER — HEPARIN (PORCINE) IN NACL 2000-0.9 UNIT/L-% IV SOLN
INTRAVENOUS | Status: DC | PRN
  Administered 2019-07-21: 500 mL

## 2019-07-21 MED ORDER — HEPARIN (PORCINE) IN NACL 1000-0.9 UT/500ML-% IV SOLN
INTRAVENOUS | Status: AC
  Filled 2019-07-21: qty 1000

## 2019-07-21 MED ORDER — SODIUM CHLORIDE 0.9 % WEIGHT BASED INFUSION: 1.0000 mL/kg/h | INTRAVENOUS | Status: DC

## 2019-07-21 MED ORDER — HYDRALAZINE HCL 20 MG/ML IJ SOLN: 10.0000 mg | INTRAMUSCULAR | Status: DC | PRN

## 2019-07-21 MED ORDER — FENTANYL CITRATE (PF) 100 MCG/2ML IJ SOLN
INTRAMUSCULAR | Status: AC
  Filled 2019-07-21: qty 2

## 2019-07-21 MED ORDER — SODIUM CHLORIDE 0.9% FLUSH: 3.0000 mL | INTRAVENOUS | Status: DC | PRN

## 2019-07-21 MED ORDER — LABETALOL HCL 5 MG/ML IV SOLN: 10.0000 mg | INTRAVENOUS | Status: DC | PRN

## 2019-07-21 MED ORDER — ASPIRIN 81 MG PO CHEW: 81.0000 mg | CHEWABLE_TABLET | ORAL | Status: DC

## 2019-07-21 MED ORDER — MIDAZOLAM HCL 2 MG/2ML IJ SOLN
INTRAMUSCULAR | Status: DC | PRN
  Administered 2019-07-21: 1 mg via INTRAVENOUS

## 2019-07-21 MED ORDER — SODIUM CHLORIDE 0.9% FLUSH: 3.0000 mL | Freq: Two times a day (BID) | INTRAVENOUS | Status: DC

## 2019-07-21 MED ORDER — SODIUM CHLORIDE 0.9 % WEIGHT BASED INFUSION
3.0000 mL/kg/h | INTRAVENOUS | Status: AC
  Administered 2019-07-21: 3 mL/kg/h via INTRAVENOUS

## 2019-07-21 SURGICAL SUPPLY — 8 items
CATH INFINITI 5 FR JL3.5 (CATHETERS) ×2 IMPLANT
CATH INFINITI JR4 5F (CATHETERS) ×2 IMPLANT
DEVICE RAD TR BAND REGULAR (VASCULAR PRODUCTS) ×2 IMPLANT
GLIDESHEATH SLEND SS 6F .021 (SHEATH) ×2 IMPLANT
KIT MANI 3VAL PERCEP (MISCELLANEOUS) ×3 IMPLANT
PACK CARDIAC CATH (CUSTOM PROCEDURE TRAY) ×3 IMPLANT
WIRE HITORQ VERSACORE ST 145CM (WIRE) ×2 IMPLANT
WIRE ROSEN-J .035X260CM (WIRE) ×2 IMPLANT

## 2019-07-21 NOTE — Discharge Instructions (Signed)
Coronary Artery Bypass Grafting  Coronary artery bypass grafting (CABG) is a surgery that is done when arteries of the heart have become narrow or blocked. This is often caused by the buildup of fat called plaques. These arteries give the heart the oxygen and nutrients it needs to pump blood to your body. During CABG, a section of blood vessel from another part of the body is taken. This section is called a graft. The graft is placed where there is narrowing or blockage. Tell your doctor about:  Any allergies you have.  All medicines you are taking. Tell him or her about any steroids, blood thinners, vitamins, herbs, eye drops, creams, and over-the-counter medicines.  Any problems you or family members have had with anesthetic medicines.  Any blood disorders you have.  Any surgeries you have had.  Any medical conditions you have.  Whether you are pregnant or may be pregnant. What are the risks? Generally, this is a safe procedure. However, problems may occur, including:  Bleeding. You may need to get blood through an IV tube (transfusions).  Infection.  Allergic reactions to medicines or dyes.  Pain at the surgical site.  Damage to organs or other parts of the body.  Short-term memory loss, confusion, and personality changes.  Heart rhythm problems (arrhythmias).  Stroke.  Heart attack during or after surgery.  Kidney failure. What happens before the procedure? Staying hydrated Follow instructions from your doctor about hydration. These may include:  Up to 2 hours before the procedure - you may continue to drink clear liquids, such as: ? Water. ? Clear fruit juice. ? Black coffee. ? Plain tea.  Eating and drinking Follow instructions from your doctor about eating and drinking. These may include:  8 hours before the procedure - stop eating heavy meals or foods, such as: ? Meat. ? Fried foods. ? Fatty foods.  6 hours before the procedure - stop eating light  meals or foods, such as: ? Toast. ? Cereal.  6 hours before the procedure - stop drinking milk or drinks that contain milk.  2 hours before the procedure - stop drinking clear liquids. Medicines  Take over-the-counter and prescription medicines only as told by your doctor.  Ask your doctor about: ? Changing or stopping your normal medicines. This is important. ? Taking aspirin and ibuprofen. Do not take these medicines unless your doctor tells you to take them. ? Taking over-the-counter medicines, vitamins, herbs, and supplements. General instructions  Ask your doctor: ? How your surgery site will be marked. ? What steps will be taken to help prevent the spread of germs. These may include:  Removing hair at the surgery site.  Washing skin with a germ-killing soap.  Taking antibiotic medicine.  You may be asked to shower with a germ-killing soap.  For 3-6 weeks before the CABG, do not use any products that contain nicotine or tobacco. These include cigarettes, e-cigarettes, and chewing tobacco. Quitting smoking is one of the best things you can do for your heart health. If you need help quitting, ask your doctor.  Talk with your doctor about where the grafts will be taken from for your surgery. What happens during the procedure?  An IV tube will be placed into one of your veins.  You will be given one or more of the following: ? A medicine to help you relax (sedative). ? A medicine to make you fall asleep (general anesthetic).  A cut (incision) will be made down the front of the  chest through the breastbone (sternum).  The breastbone will be opened so your heart can be seen.  You may or may not be placed on a heart-lung bypass machine. ? If this machine is used, your heart will be briefly stopped. ? This machine will give oxygen to your blood while your heart is being worked on.  A section of blood vessel will be removed from another part of your body (often the chest,  arm, or leg).  The blood vessel will be attached above and below the blocked artery of your heart. This may be done on more than one artery of the heart.  You will be taken off the heart-lung machine if it was used.  If your heart was stopped, it will be restarted.  Your chest will be closed with special wire that will hold your bones together as they heal.  Your cuts will be closed with stitches (sutures), skin glue, or skin tape (adhesive) strips.  A bandage (dressing) will be placed over the cuts.  Tubes will stay in your chest. They will be connected to a device that will help drain fluid and reinflate the lungs. The procedure may vary among doctors and hospitals. What happens after the procedure?  You will be monitored until you leave the hospital. This includes checking your blood pressure, heart rate, breathing rate, and blood oxygen level.  You may wake up with a tube in your throat. This tube will help you breathe. You may be connected to a breathing machine. You will not be able to talk when the tube is in. The tube will be taken out when it is safe.  You will be groggy and may have some pain. You will be given medicine to help the pain.  You may be in the intensive care unit for 1-2 days.  You may be given oxygen to help you breathe.  You will be shown how to do deep breathing exercises.  You may have to wear compression stockings. These stockings help to prevent blood clots and reduce swelling in your legs.  You may be given new medicines to take.  Cardiac rehab will be started while you are in the hospital. This may include education and exercises to help you recover from your surgery. Summary  During CABG, a section of blood vessel from another part of the body is taken out. It is then placed where there is narrowing or blockage.  For 3-6 weeks before the procedure, do not use any products that contain nicotine or tobacco. Quitting smoking is one of the best  things you can do for your heart health. If you need help quitting, ask your doctor.  You may wake up with a tube in your throat. This tube will help you breathe. You will not be able to talk when the tube is in. The tube will be taken out when it is safe. This information is not intended to replace advice given to you by your health care provider. Make sure you discuss any questions you have with your health care provider. Document Released: 11/18/2013 Document Revised: 07/23/2018 Document Reviewed: 07/23/2018 Elsevier Patient Education  2020 [employer] This sheet gives you information about how to care for yourself after your procedure. Your health care provider may also give you more specific instructions. If you have problems or questions, contact your health care provider. What can I expect after the procedure? After the procedure, it is common to have bruising and tenderness at  the catheter insertion area. Follow these instructions at home: Insertion site care  Follow instructions from your health care provider about how to take care of your insertion site. Make sure you: ? Wash your hands with soap and water before you change your bandage (dressing). If soap and water are not available, use hand sanitizer. ? Change your dressing as told by your health care provider. ? Leave stitches (sutures), skin glue, or adhesive strips in place. These skin closures may need to stay in place for 2 weeks or longer. If adhesive strip edges start to loosen and curl up, you may trim the loose edges. Do not remove adhesive strips completely unless your health care provider tells you to do that.  Do not take baths, swim, or use a hot tub until your health care provider approves.  You may shower 24-48 hours after the procedure or as told by your health care provider. ? Gently wash the site with plain soap and water. ? Pat the area dry with a clean towel. ? Do not rub the site. This  may cause bleeding.  Do not apply powder or lotion to the site. Keep the site clean and dry.  Check your insertion site every day for signs of infection. Check for: ? Redness, swelling, or pain. ? Fluid or blood. ? Warmth. ? Pus or a bad smell. Activity  Rest as told by your health care provider, usually for 1-2 days.  Do not lift anything that is heavier than 10 lbs. (4.5 kg) or as told by your health care provider.  Do not drive for 24 hours if you were given a medicine to help you relax (sedative).  Do not drive or use heavy machinery while taking prescription pain medicine. General instructions   Return to your normal activities as told by your health care provider, usually in about a week. Ask your health care provider what activities are safe for you.  If the catheter site starts bleeding, lie flat and put pressure on the site. If the bleeding does not stop, get help right away. This is a medical emergency.  Drink enough fluid to keep your urine clear or pale yellow. This helps flush the contrast dye from your body.  Take over-the-counter and prescription medicines only as told by your health care provider.  Keep all follow-up visits as told by your health care provider. This is important. Contact a health care provider if:  You have a fever or chills.  You have redness, swelling, or pain around your insertion site.  You have fluid or blood coming from your insertion site.  The insertion site feels warm to the touch.  You have pus or a bad smell coming from your insertion site.  You have bruising around the insertion site.  You notice blood collecting in the tissue around the catheter site (hematoma). The hematoma may be painful to the touch. Get help right away if:  You have severe pain at the catheter insertion area.  The catheter insertion area swells very fast.  The catheter insertion area is bleeding, and the bleeding does not stop when you hold steady  pressure on the area.  The area near or just beyond the catheter insertion site becomes pale, cool, tingly, or numb. These symptoms may represent a serious problem that is an emergency. Do not wait to see if the symptoms will go away. Get medical help right away. Call your local emergency services (911 in the [place] Do not drive yourself  to the hospital. Summary  After the procedure, it is common to have bruising and tenderness at the catheter insertion area.  After the procedure, it is important to rest and drink plenty of fluids.  Do not take baths, swim, or use a hot tub until your health care provider says it is okay to do so. You may shower 24-48 hours after the procedure or as told by your health care provider.  If the catheter site starts bleeding, lie flat and put pressure on the site. If the bleeding does not stop, get help right away. This is a medical emergency. This information is not intended to replace advice given to you by your health care provider. Make sure you discuss any questions you have with your health care provider. Document Released: 06/01/2005 Document Revised: 10/26/2017 Document Reviewed: 10/18/2016 Elsevier Patient Education  2020 [employer].

## 2019-07-22 ENCOUNTER — Encounter: Payer: Self-pay | Admitting: Internal Medicine

## 2019-07-25 ENCOUNTER — Encounter: Payer: Medicare HMO | Admitting: Cardiothoracic Surgery

## 2019-07-31 ENCOUNTER — Other Ambulatory Visit: Payer: Self-pay | Admitting: Family Medicine

## 2019-07-31 ENCOUNTER — Other Ambulatory Visit: Payer: Self-pay

## 2019-07-31 DIAGNOSIS — M5416 Radiculopathy, lumbar region: Secondary | ICD-10-CM

## 2019-07-31 DIAGNOSIS — I10 Essential (primary) hypertension: Secondary | ICD-10-CM

## 2019-07-31 MED ORDER — OXYCODONE-ACETAMINOPHEN 10-325 MG PO TABS: 1.0000 | ORAL_TABLET | Freq: Three times a day (TID) | ORAL | 0 refills | Status: DC | PRN

## 2019-07-31 NOTE — Telephone Encounter (Signed)
Patient request refill

## 2019-07-31 NOTE — Telephone Encounter (Signed)
Pt needs a refill on Amlodipine also.  [place]

## 2019-08-01 ENCOUNTER — Other Ambulatory Visit: Payer: Self-pay | Admitting: *Deleted

## 2019-08-01 ENCOUNTER — Encounter: Payer: Self-pay | Admitting: Cardiothoracic Surgery

## 2019-08-01 ENCOUNTER — Encounter: Payer: Self-pay | Admitting: *Deleted

## 2019-08-01 ENCOUNTER — Institutional Professional Consult (permissible substitution): Payer: Medicare HMO | Admitting: Cardiothoracic Surgery

## 2019-08-01 ENCOUNTER — Other Ambulatory Visit: Payer: Self-pay

## 2019-08-01 VITALS — BP 143/76 | HR 76 | Temp 97.8°F | Resp 20 | Ht 73.0 in | Wt 198.0 lb

## 2019-08-01 DIAGNOSIS — I25118 Atherosclerotic heart disease of native coronary artery with other forms of angina pectoris: Secondary | ICD-10-CM | POA: Diagnosis not present

## 2019-08-01 DIAGNOSIS — I2510 Atherosclerotic heart disease of native coronary artery without angina pectoris: Secondary | ICD-10-CM

## 2019-08-01 NOTE — Progress Notes (Signed)
[address]       [place] [address]             [phone]     CARDIOTHORACIC SURGERY CONSULTATION REPORT  Referring Provider is [name], MD Primary Cardiologist is No primary care provider on file. PCP is Fisher, Kirstie Peri, MD  Chief Complaint  Patient presents with  . Coronary Artery Disease    Surgical eval, cardiac cath  07/21/19    HPI: 68 year old gentleman presents today for consideration of coronary artery bypass grafting.  He was in his usual state of health until the last several weeks when he began to experience left-sided chest pain worse with exertion.  This was also accompanied typically by shortness of breath.  He presented with these complaints to his local physician who ordered a stress test which was stopped due to symptoms.  He underwent a left heart catheterization demonstrating multivessel coronary disease with a complicated LAD anatomy.  Patient had previously undergone LAD stenting.  There is in-stent restenosis now.  He denies rest pain or rest dyspnea.  Past Medical History:  Diagnosis Date  . Benign neoplasm of descending colon   . BPH (benign prostatic hyperplasia)   . Diabetes mellitus without complication ([place])   . Hemorrhage of gastrointestinal tract 07/23/2008  . Hyperlipidemia   . Myocardial infarction ([place])   . Polyp of sigmoid colon     Past Surgical History:  Procedure Laterality Date  . chest surgery for fungal infection    . COLONOSCOPY WITH PROPOFOL N/A 07/10/2017   Procedure: COLONOSCOPY WITH PROPOFOL;  Surgeon: [name], MD;  Location: [facility] ENDOSCOPY;  Service: Endoscopy;  Laterality: N/A;  . ESOPHAGOGASTRODUODENOSCOPY (EGD) WITH PROPOFOL N/A 07/10/2017   Procedure: ESOPHAGOGASTRODUODENOSCOPY (EGD) WITH PROPOFOL;  Surgeon: [name], MD;  Location: [facility] ENDOSCOPY;  Service: Endoscopy;  Laterality: N/A;  . HERNIA REPAIR    . LEFT HEART CATH AND CORONARY ANGIOGRAPHY Left 07/21/2019   Procedure:  LEFT HEART CATH AND CORONARY ANGIOGRAPHY;  Surgeon: [name], MD;  Location: [facility] LAB;  Service: Cardiovascular;  Laterality: Left;    Family History  Problem Relation Age of Onset  . Hypertension Mother     Social History   Socioeconomic History  . Marital status: Legally Separated    Spouse name: Not on file  . Number of children: 2  . Years of education: Not on file  . Highest education level: High school graduate  Occupational History  . 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 Financial resource strain: Not hard at all  . Food insecurity    Worry: Never true    Inability: Never true  . Transportation needs    Medical: No    Non-medical: No  Tobacco Use  . Smoking status: Former Research scientist (life sciences)  . Smokeless tobacco: Former Systems developer    Types: Chew    Quit date: 11/27/1968  . Tobacco comment: used 2 packs per week; quit over 40 years ago  Substance and Sexual Activity  . Alcohol use: Yes    Alcohol/week: 6.0 standard drinks    Types: 6 Cans of beer per week  . Drug use: No  . Sexual activity: Not on file  Lifestyle  . Physical activity    Days per week: 3 days    Minutes per session: 30 min  . Stress: Not at all  Relationships  . Social connections  Talks on phone: Patient refused    Gets together: Patient refused    Attends religious service: Patient refused    Active member of club or organization: Patient refused    Attends meetings of clubs or organizations: Patient refused    Relationship status: Patient refused  . Intimate partner violence    Fear of current or ex partner: Patient refused    Emotionally abused: Patient refused    Physically abused: Patient refused    Forced sexual activity: Patient refused  Other Topics Concern  . Not on file  Social History Narrative  . Not on file    Current Outpatient Medications  Medication Sig Dispense Refill  . acetaminophen (TYLENOL) 500  MG tablet Take 1,000 mg by mouth every 6 (six) hours as needed for moderate pain.    [name] amLODipine (NORVASC) 10 MG tablet Take 1 tablet by mouth once daily 90 tablet 4  . aspirin 81 MG tablet Take 1 tablet by mouth daily.    . empagliflozin (JARDIANCE) 25 MG TABS tablet Take 25 mg by mouth daily. 30 tablet 4  . glipiZIDE (GLUCOTROL) 10 MG tablet Take 1 tablet (10 mg total) by mouth daily. 90 tablet 4  . ibuprofen (ADVIL,MOTRIN) 800 MG tablet TAKE 1 TABLET BY MOUTH EVERY 8 HOURS AS NEEDED WITH FOOD 20 tablet 3  . metFORMIN (GLUCOPHAGE) 500 MG tablet TAKE TWO TABLETS BY MOUTH TWICE DAILY WITH A MEAL (Patient taking differently: Take 500 mg by mouth daily with breakfast. ) 360 tablet 4  . metoprolol succinate (TOPROL-XL) 25 MG 24 hr tablet Take 1 tablet (25 mg total) by mouth daily. 30 tablet 1  . oxyCODONE-acetaminophen (PERCOCET) 10-325 MG tablet Take 1 tablet by mouth every 8 (eight) hours as needed for pain. 30 tablet 0  . rosuvastatin (CRESTOR) 10 MG tablet Take 10 mg by mouth daily.    . sildenafil (VIAGRA) 100 MG tablet Take 0.5-1 tablets (50-100 mg total) by mouth daily as needed for erectile dysfunction. 10 tablet 11  . silver sulfADIAZINE (SILVADENE) 1 % cream Apply 1 application topically daily. 400 g 0   No current facility-administered medications for this visit.     Allergies  Allergen Reactions  . Simvastatin     Elevated CK  . Penicillins Rash    Did it involve swelling of the face/tongue/throat, SOB, or low BP? No Did it involve sudden or severe rash/hives, skin peeling, or any reaction on the inside of your mouth or nose? No Did you need to seek medical attention at a hospital or doctor's office? No When did it last happen? If all above answers are "NO", may proceed with cephalosporin use.       Review of Systems:   General:  No change appetite, no change energy, no weight gain/ loss  Cardiac:  Per HPI  Respiratory:  Positive shortness of breath with activity,    GI:   Colonoscopy in 2018  GU:   Negative  Vascular:  Negative  Neuro:   Denies stroke/ TIA's,   Musculoskeletal: No arthritis/jjoint swelling,  Skin:   Negative  Psych:   To get it  Eyes:   Does wear glasses  ENT:    last saw dentist July '20  Hematologic:  Negative  Endocrine:             + diabetes     Physical Exam:   BP (!) 143/76   Pulse 76   Temp 97.8 F (36.6 C) (Skin)   Resp  20   Ht 6\' 1"  (1.854 m)   Wt 89.8 kg   SpO2 97% Comment: RA  BMI 26.12 kg/m   General:    well-appearing  HEENT:  Unremarkable   Neck:   no JVD, no bruits, no adenopathy   Chest:   clear to auscultation, symmetrical breath sounds, no wheezes, no rhonchi   CV:   RRR, no  murmur   Abdomen:  soft, non-tender, no masses   Extremities:  warm, well-perfused, pulses intact, no LE edema  Rectal/GU  Deferred  Neuro:   Grossly non-focal and symmetrical throughout  Skin:   Clean and dry, no rashes, no breakdown   Diagnostic Tests:  LHC is reviewed with the patient and family. I agree with the interpretation of the images.   Impression:  Mr. Backe is a 68 year old with progressive angina and a history of coronary disease.  He has angiographic evidence for complicated multivessel disease including LAD disease.  Agree that CABG is the best solution.   Plan:  Complete work-up for CABG.  This would include carotid ultrasound, pulmonary function testing, echocardiography, and chest x-ray.  Tentatively plan CABG surgery for 08/12/2019   I spent in excess of 45 minutes during the conduct of this office consultation and >50% of this time involved direct face-to-face encounter with the patient for counseling and/or coordination of their care.          Level 3 Office Consult = 40 minutes         Level 4 Office Consult = 60 minutes         Level 5 Office Consult = 80 minutes  Craig York Z. Orvan Seen, [place] 08/01/2019 12:39 PM

## 2019-08-08 ENCOUNTER — Encounter (HOSPITAL_COMMUNITY): Payer: Self-pay

## 2019-08-08 ENCOUNTER — Ambulatory Visit (HOSPITAL_COMMUNITY)
Admission: RE | Admit: 2019-08-08 | Discharge: 2019-08-08 | Disposition: A | Discharge status: Home / Self Care | Payer: Medicare HMO | Source: Ambulatory Visit | Attending: Cardiothoracic Surgery | Admitting: Cardiothoracic Surgery

## 2019-08-08 ENCOUNTER — Other Ambulatory Visit (HOSPITAL_COMMUNITY)
Admission: RE | Admit: 2019-08-08 | Discharge: 2019-08-08 | Disposition: A | Discharge status: Home / Self Care | Payer: Medicare HMO | Source: Ambulatory Visit | Attending: Cardiothoracic Surgery | Admitting: Cardiothoracic Surgery

## 2019-08-08 ENCOUNTER — Encounter (HOSPITAL_COMMUNITY)
Admission: RE | Admit: 2019-08-08 | Discharge: 2019-08-08 | Disposition: A | Discharge status: Home / Self Care | Payer: Medicare HMO | Source: Ambulatory Visit | Attending: Cardiothoracic Surgery | Admitting: Cardiothoracic Surgery

## 2019-08-08 ENCOUNTER — Other Ambulatory Visit: Payer: Self-pay

## 2019-08-08 DIAGNOSIS — E119 Type 2 diabetes mellitus without complications: Secondary | ICD-10-CM | POA: Insufficient documentation

## 2019-08-08 DIAGNOSIS — I10 Essential (primary) hypertension: Secondary | ICD-10-CM | POA: Insufficient documentation

## 2019-08-08 DIAGNOSIS — Z20828 Contact with and (suspected) exposure to other viral communicable diseases: Secondary | ICD-10-CM | POA: Insufficient documentation

## 2019-08-08 DIAGNOSIS — Z79899 Other long term (current) drug therapy: Secondary | ICD-10-CM | POA: Insufficient documentation

## 2019-08-08 DIAGNOSIS — R9431 Abnormal electrocardiogram [ECG] [EKG]: Secondary | ICD-10-CM | POA: Diagnosis not present

## 2019-08-08 DIAGNOSIS — Z01818 Encounter for other preprocedural examination: Secondary | ICD-10-CM | POA: Insufficient documentation

## 2019-08-08 DIAGNOSIS — Z86010 Personal history of colonic polyps: Secondary | ICD-10-CM | POA: Insufficient documentation

## 2019-08-08 DIAGNOSIS — Z7984 Long term (current) use of oral hypoglycemic drugs: Secondary | ICD-10-CM | POA: Insufficient documentation

## 2019-08-08 DIAGNOSIS — I2510 Atherosclerotic heart disease of native coronary artery without angina pectoris: Secondary | ICD-10-CM

## 2019-08-08 DIAGNOSIS — Z7982 Long term (current) use of aspirin: Secondary | ICD-10-CM | POA: Insufficient documentation

## 2019-08-08 DIAGNOSIS — E785 Hyperlipidemia, unspecified: Secondary | ICD-10-CM | POA: Insufficient documentation

## 2019-08-08 DIAGNOSIS — I252 Old myocardial infarction: Secondary | ICD-10-CM | POA: Diagnosis not present

## 2019-08-08 DIAGNOSIS — D509 Iron deficiency anemia, unspecified: Secondary | ICD-10-CM | POA: Diagnosis not present

## 2019-08-08 DIAGNOSIS — Z01810 Encounter for preprocedural cardiovascular examination: Secondary | ICD-10-CM | POA: Diagnosis not present

## 2019-08-08 HISTORY — DX: Iron deficiency anemia, unspecified: D50.9

## 2019-08-08 HISTORY — DX: Angina pectoris, unspecified: I20.9

## 2019-08-08 HISTORY — DX: Atherosclerotic heart disease of native coronary artery without angina pectoris: I25.10

## 2019-08-08 HISTORY — DX: Essential (primary) hypertension: I10

## 2019-08-08 LAB — CBC
HCT: 33.6 % — ABNORMAL LOW (ref 39.0–52.0)
Hemoglobin: 9.7 g/dL — ABNORMAL LOW (ref 13.0–17.0)
MCH: 23 pg — ABNORMAL LOW (ref 26.0–34.0)
MCHC: 28.9 g/dL — ABNORMAL LOW (ref 30.0–36.0)
MCV: 79.6 fL — ABNORMAL LOW (ref 80.0–100.0)
Platelets: 233 10*3/uL (ref 150–400)
RBC: 4.22 MIL/uL (ref 4.22–5.81)
RDW: 15.5 % (ref 11.5–15.5)
WBC: 8.2 10*3/uL (ref 4.0–10.5)
nRBC: 0 % (ref 0.0–0.2)

## 2019-08-08 LAB — BLOOD GAS, ARTERIAL
Acid-base deficit: 2.9 mmol/L — ABNORMAL HIGH (ref 0.0–2.0)
Bicarbonate: 21.1 mmol/L (ref 20.0–28.0)
Drawn by: 421801
FIO2: 21
O2 Saturation: 98.9 %
Patient temperature: 98.6
pCO2 arterial: 34.7 mmHg (ref 32.0–48.0)
pH, Arterial: 7.401 (ref 7.350–7.450)
pO2, Arterial: 122 mmHg — ABNORMAL HIGH (ref 83.0–108.0)

## 2019-08-08 LAB — HEMOGLOBIN A1C
Hgb A1c MFr Bld: 5.9 % — ABNORMAL HIGH (ref 4.8–5.6)
Mean Plasma Glucose: 122.63 mg/dL

## 2019-08-08 LAB — COMPREHENSIVE METABOLIC PANEL
ALT: 26 U/L (ref 0–44)
AST: 24 U/L (ref 15–41)
Albumin: 4.2 g/dL (ref 3.5–5.0)
Alkaline Phosphatase: 65 U/L (ref 38–126)
Anion gap: 9 (ref 5–15)
BUN: 10 mg/dL (ref 8–23)
CO2: 20 mmol/L — ABNORMAL LOW (ref 22–32)
Calcium: 9.2 mg/dL (ref 8.9–10.3)
Chloride: 110 mmol/L (ref 98–111)
Creatinine, Ser: 0.92 mg/dL (ref 0.61–1.24)
GFR calc Af Amer: >60 mL/min (ref >60)
GFR calc non Af Amer: >60 mL/min (ref >60)
Glucose, Bld: 145 mg/dL — ABNORMAL HIGH (ref 70–99)
Potassium: 3.7 mmol/L (ref 3.5–5.1)
Sodium: 139 mmol/L (ref 135–145)
Total Bilirubin: 0.6 mg/dL (ref 0.3–1.2)
Total Protein: 7.9 g/dL (ref 6.5–8.1)

## 2019-08-08 LAB — URINALYSIS, ROUTINE W REFLEX MICROSCOPIC
Bilirubin Urine: NEGATIVE
Glucose, UA: >=500 mg/dL — AB
Hgb urine dipstick: NEGATIVE
Ketones, ur: NEGATIVE mg/dL
Leukocytes,Ua: NEGATIVE
Nitrite: NEGATIVE
Protein, ur: NEGATIVE mg/dL
Specific Gravity, Urine: 1.029 (ref 1.005–1.030)
pH: 5 (ref 5.0–8.0)

## 2019-08-08 LAB — PROTIME-INR
INR: 1 (ref 0.8–1.2)
Prothrombin Time: 13.4 seconds (ref 11.4–15.2)

## 2019-08-08 LAB — SURGICAL PCR SCREEN
MRSA, PCR: NEGATIVE
Staphylococcus aureus: NEGATIVE

## 2019-08-08 LAB — APTT: aPTT: 33 seconds (ref 24–36)

## 2019-08-08 LAB — GLUCOSE, CAPILLARY: Glucose-Capillary: 151 mg/dL — ABNORMAL HIGH (ref 70–99)

## 2019-08-08 NOTE — Progress Notes (Signed)
[facility] [address] ([address]), [place] [address] [place] ([place]) [place] [address] Phone: [phone] Fax: [phone]      Your procedure is scheduled on Tuesday 08/12/2019.  Report to [facility] Main Entrance "A" at 05:30 A.M., and check in at the Admitting office.  Call this number if you have problems the morning of surgery:  [phone]  Call [phone] if you have any questions prior to your surgery date Monday-Friday 8am-4pm    Remember:  Do not eat or drink after midnight the night before your surgery   Take these medicines the morning of surgery with A SIP OF WATER: Acetaminophen (Tylenol) - if needed Amlodipine (Norvasc) Metoprolol succinate (Toprol-XL) Oxycodone-acetaminophen (Percocet) - if needed Rosuvastatin (Crestor)  Follow your surgeon's instructions on when to stop Aspirin.  If no instructions were given by your surgeon then you will need to call the office to get those instructions.     7 days prior to surgery STOP taking any Aspirin (unless otherwise instructed by your surgeon), Aleve, Naproxen, Ibuprofen, Motrin, Advil, Goody's, BC's, all herbal medications, fish oil, and all vitamins.   WHAT DO I DO ABOUT MY DIABETES MEDICATION?  . DO NOT take Empagliflozin (Jardiance) the day before surgery or the morning of surgery.  . DO NOT take Glipizide (Glucotrol) or Metformin (Glucophage) the morning of surgery.    How to Manage Your Diabetes Before and After Surgery  Why is it important to control my blood sugar before and after surgery? . Improving blood sugar levels before and after surgery helps healing and can limit problems. . A way of improving blood sugar control is eating a healthy diet by: o  Eating less sugar and carbohydrates o  Increasing activity/exercise o  Talking with your doctor about reaching your blood sugar goals . High blood sugars (greater than 180 mg/dL) can raise your risk of  infections and slow your recovery, so you will need to focus on controlling your diabetes during the weeks before surgery. . Make sure that the doctor who takes care of your diabetes knows about your planned surgery including the date and location.  How do I manage my blood sugar before surgery? . Check your blood sugar at least 4 times a day, starting 2 days before surgery, to make sure that the level is not too high or low. o Check your blood sugar the morning of your surgery when you wake up and every 2 hours until you get to the Short Stay unit. . If your blood sugar is less than 70 mg/dL, you will need to treat for low blood sugar: o Do not take insulin. o Treat a low blood sugar (less than 70 mg/dL) with  cup of clear juice (cranberry or apple), 4 glucose tablets, OR glucose gel. o Recheck blood sugar in 15 minutes after treatment (to make sure it is greater than 70 mg/dL). If your blood sugar is not greater than 70 mg/dL on recheck, call [phone] for further instructions. . Report your blood sugar to the short stay nurse when you get to Short Stay.  . If you are admitted to the hospital after surgery: o Your blood sugar will be checked by the staff and you will probably be given insulin after surgery (instead of oral diabetes medicines) to make sure you have good blood sugar levels. o The goal for blood sugar control after surgery is 80-180 mg/dL.      The Morning of Surgery  Do not wear jewelry.  Do not wear lotions, powders, or perfumes/colognes, or deodorant  Men may shave face and neck.  Do not bring valuables to the hospital.  [facility] is not responsible for any belongings or valuables.  IF you are a smoker, DO NOT Smoke 24 hours prior to surgery  IF you wear a CPAP at night please bring your mask, tubing, and machine the morning of surgery   Remember that you must have someone to transport you home after your surgery, and remain with you for 24 hours if you are  discharged the same day.   Contacts, eyeglasses, hearing aids, dentures or bridgework may not be worn into surgery.    Leave your suitcase in the car.  After surgery it may be brought to your room.  For patients admitted to the hospital, discharge time will be determined by your treatment team.  Patients discharged the day of surgery will not be allowed to drive home.    Special instructions:   [place]- Preparing For Surgery  Before surgery, you can play an important role. Because skin is not sterile, your skin needs to be as free of germs as possible. You can reduce the number of germs on your skin by washing with CHG (chlorahexidine gluconate) Soap before surgery.  CHG is an antiseptic cleaner which kills germs and bonds with the skin to continue killing germs even after washing.    Oral Hygiene is also important to reduce your risk of infection.  Remember - BRUSH YOUR TEETH THE MORNING OF SURGERY WITH YOUR REGULAR TOOTHPASTE  Please do not use if you have an allergy to CHG or antibacterial soaps. If your skin becomes reddened/irritated stop using the CHG.  Do not shave (including legs and underarms) for at least 48 hours prior to first CHG shower. It is OK to shave your face.  Please follow these instructions carefully.   1. Shower the NIGHT BEFORE SURGERY and the MORNING OF SURGERY with CHG Soap.   2. If you chose to wash your hair, wash your hair first as usual with your normal shampoo.  3. After you shampoo, rinse your hair and body thoroughly to remove the shampoo.  4. Use CHG as you would any other liquid soap. You can apply CHG directly to the skin and wash gently with a scrungie or a clean washcloth.   5. Apply the CHG Soap to your body ONLY FROM THE NECK DOWN.  Do not use on open wounds or open sores. Avoid contact with your eyes, ears, mouth and genitals (private parts). Wash Face and genitals (private parts)  with your normal soap.   6. Wash thoroughly, paying  special attention to the area where your surgery will be performed.  7. Thoroughly rinse your body with warm water from the neck down.  8. DO NOT shower/wash with your normal soap after using and rinsing off the CHG Soap.  9. Pat yourself dry with a CLEAN TOWEL.  10. Wear CLEAN PAJAMAS to bed the night before surgery, wear comfortable clothes the morning of surgery  11. Place CLEAN SHEETS on your bed the night of your first shower and DO NOT SLEEP WITH PETS.    Day of Surgery:   Please shower the morning of surgery with the CHG soap Do not apply any deodorants/lotions.  Please wear clean clothes to the hospital/surgery center.   Remember to brush your teeth WITH YOUR REGULAR TOOTHPASTE.   Please read over the following fact  sheets that you were given.

## 2019-08-08 NOTE — Progress Notes (Signed)
PCP: Dr. Caryn Section Cardiologist: Dr. Clayborn Bigness  EKG: Today CXR: Today ECHO: 06/2019 Stress Test: 11/2017 Cardiac Cath: 06/2019 Scheduled for Dopplers and PFTs for Monday 08/11/2019  Does not own meter, does not check CBG  Patient denies shortness of breath, fever, cough, and chest pain at PAT appointment.  Patient verbalized understanding of instructions provided today at the PAT appointment.  Patient asked to review instructions at home and day of surgery.

## 2019-08-09 LAB — NOVEL CORONAVIRUS, NAA (HOSP ORDER, SEND-OUT TO REF LAB; TAT 18-24 HRS): SARS-CoV-2, NAA: NOT DETECTED

## 2019-08-11 ENCOUNTER — Encounter (HOSPITAL_COMMUNITY): Payer: Self-pay | Admitting: Certified Registered Nurse Anesthetist

## 2019-08-11 ENCOUNTER — Inpatient Hospital Stay (HOSPITAL_COMMUNITY): Admission: RE | Admit: 2019-08-11 | Payer: Medicare HMO | Source: Ambulatory Visit

## 2019-08-11 ENCOUNTER — Other Ambulatory Visit: Payer: Self-pay

## 2019-08-11 ENCOUNTER — Encounter (HOSPITAL_COMMUNITY): Payer: Self-pay

## 2019-08-11 ENCOUNTER — Ambulatory Visit (HOSPITAL_COMMUNITY): Payer: Medicare HMO

## 2019-08-11 ENCOUNTER — Ambulatory Visit (HOSPITAL_BASED_OUTPATIENT_CLINIC_OR_DEPARTMENT_OTHER)
Admission: RE | Admit: 2019-08-11 | Discharge: 2019-08-11 | Disposition: A | Discharge status: Home / Self Care | Payer: Medicare HMO | Source: Ambulatory Visit | Attending: Cardiothoracic Surgery | Admitting: Cardiothoracic Surgery

## 2019-08-11 DIAGNOSIS — I252 Old myocardial infarction: Secondary | ICD-10-CM | POA: Diagnosis not present

## 2019-08-11 DIAGNOSIS — E119 Type 2 diabetes mellitus without complications: Secondary | ICD-10-CM | POA: Diagnosis not present

## 2019-08-11 DIAGNOSIS — D62 Acute posthemorrhagic anemia: Secondary | ICD-10-CM | POA: Diagnosis not present

## 2019-08-11 DIAGNOSIS — J9811 Atelectasis: Secondary | ICD-10-CM | POA: Diagnosis not present

## 2019-08-11 DIAGNOSIS — R931 Abnormal findings on diagnostic imaging of heart and coronary circulation: Secondary | ICD-10-CM | POA: Diagnosis not present

## 2019-08-11 DIAGNOSIS — E785 Hyperlipidemia, unspecified: Secondary | ICD-10-CM | POA: Diagnosis not present

## 2019-08-11 DIAGNOSIS — Z4682 Encounter for fitting and adjustment of non-vascular catheter: Secondary | ICD-10-CM | POA: Diagnosis not present

## 2019-08-11 DIAGNOSIS — Z48812 Encounter for surgical aftercare following surgery on the circulatory system: Secondary | ICD-10-CM | POA: Diagnosis not present

## 2019-08-11 DIAGNOSIS — I25119 Atherosclerotic heart disease of native coronary artery with unspecified angina pectoris: Secondary | ICD-10-CM | POA: Diagnosis not present

## 2019-08-11 DIAGNOSIS — I10 Essential (primary) hypertension: Secondary | ICD-10-CM | POA: Diagnosis not present

## 2019-08-11 DIAGNOSIS — I2510 Atherosclerotic heart disease of native coronary artery without angina pectoris: Secondary | ICD-10-CM

## 2019-08-11 DIAGNOSIS — Z951 Presence of aortocoronary bypass graft: Secondary | ICD-10-CM | POA: Diagnosis not present

## 2019-08-11 DIAGNOSIS — Z87891 Personal history of nicotine dependence: Secondary | ICD-10-CM | POA: Diagnosis not present

## 2019-08-11 MED ORDER — INSULIN REGULAR(HUMAN) IN NACL 100-0.9 UT/100ML-% IV SOLN
INTRAVENOUS | Status: AC
  Administered 2019-08-12: 1 [IU]/h via INTRAVENOUS
  Filled 2019-08-11: qty 100

## 2019-08-11 MED ORDER — MILRINONE LACTATE IN DEXTROSE 20-5 MG/100ML-% IV SOLN
0.3000 ug/kg/min | INTRAVENOUS | Status: DC
  Filled 2019-08-11: qty 100

## 2019-08-11 MED ORDER — NITROGLYCERIN IN D5W 200-5 MCG/ML-% IV SOLN
2.0000 ug/min | INTRAVENOUS | Status: DC
  Filled 2019-08-11: qty 250

## 2019-08-11 MED ORDER — POTASSIUM CHLORIDE 2 MEQ/ML IV SOLN
80.0000 meq | INTRAVENOUS | Status: DC
  Filled 2019-08-11: qty 40

## 2019-08-11 MED ORDER — DOPAMINE-DEXTROSE 3.2-5 MG/ML-% IV SOLN
0.0000 ug/kg/min | INTRAVENOUS | Status: DC
  Filled 2019-08-11: qty 250

## 2019-08-11 MED ORDER — TRANEXAMIC ACID (OHS) BOLUS VIA INFUSION
15.0000 mg/kg | INTRAVENOUS | Status: AC
  Administered 2019-08-12: 1351.5 mg via INTRAVENOUS
  Filled 2019-08-11: qty 1352

## 2019-08-11 MED ORDER — LEVOFLOXACIN IN D5W 500 MG/100ML IV SOLN
500.0000 mg | INTRAVENOUS | Status: AC
  Administered 2019-08-12: 500 mg via INTRAVENOUS
  Filled 2019-08-11: qty 100

## 2019-08-11 MED ORDER — VANCOMYCIN HCL 1000 MG IV SOLR
INTRAVENOUS | Status: DC
  Filled 2019-08-11: qty 1000

## 2019-08-11 MED ORDER — DEXMEDETOMIDINE HCL IN NACL 400 MCG/100ML IV SOLN
0.1000 ug/kg/h | INTRAVENOUS | Status: AC
  Administered 2019-08-12: .3 ug/kg/h via INTRAVENOUS
  Filled 2019-08-11: qty 100

## 2019-08-11 MED ORDER — TRANEXAMIC ACID 1000 MG/10ML IV SOLN
1.5000 mg/kg/h | INTRAVENOUS | Status: AC
  Administered 2019-08-12: 1.5 mg/kg/h via INTRAVENOUS
  Filled 2019-08-11: qty 25

## 2019-08-11 MED ORDER — VANCOMYCIN HCL 10 G IV SOLR
1500.0000 mg | INTRAVENOUS | Status: AC
  Administered 2019-08-12: 08:00:00 1500 mg via INTRAVENOUS
  Filled 2019-08-11: qty 1500

## 2019-08-11 MED ORDER — SODIUM CHLORIDE 0.9 % IV SOLN
INTRAVENOUS | Status: DC
  Filled 2019-08-11: qty 30

## 2019-08-11 MED ORDER — TRANEXAMIC ACID (OHS) PUMP PRIME SOLUTION
2.0000 mg/kg | INTRAVENOUS | Status: DC
  Filled 2019-08-11: qty 1.8

## 2019-08-11 MED ORDER — EPINEPHRINE HCL 5 MG/250ML IV SOLN IN NS
0.0000 ug/min | INTRAVENOUS | Status: DC
  Filled 2019-08-11: qty 250

## 2019-08-11 MED ORDER — PHENYLEPHRINE HCL-NACL 20-0.9 MG/250ML-% IV SOLN
30.0000 ug/min | INTRAVENOUS | Status: AC
  Administered 2019-08-12: 08:00:00 30 ug/min via INTRAVENOUS
  Filled 2019-08-11: qty 250

## 2019-08-11 MED ORDER — PLASMA-LYTE 148 IV SOLN
INTRAVENOUS | Status: DC
  Filled 2019-08-11: qty 2.5

## 2019-08-11 MED ORDER — MAGNESIUM SULFATE 50 % IJ SOLN
40.0000 meq | INTRAMUSCULAR | Status: DC
  Filled 2019-08-11: qty 9.85

## 2019-08-11 NOTE — Anesthesia Preprocedure Evaluation (Addendum)
Anesthesia Evaluation  Patient identified by MRN, date of birth, ID band Patient awake    Reviewed: Allergy & Precautions, NPO status , Patient's Chart, lab work & pertinent test results  History of Anesthesia Complications Negative for: history of anesthetic complications  Airway Mallampati: III  TM Distance: >3 FB Neck ROM: Full    Dental  (+) Dental Advisory Given   Pulmonary neg recent URI, former smoker,    breath sounds clear to auscultation       Cardiovascular hypertension, + angina + CAD and + Past MI   Rhythm:Regular     Neuro/Psych  Neuromuscular disease negative psych ROS   GI/Hepatic negative GI ROS, Neg liver ROS,   Endo/Other  diabetes  Renal/GU negative Renal ROS     Musculoskeletal  (+) Arthritis ,   Abdominal   Peds  Hematology  (+) anemia ,   Anesthesia Other Findings   Reproductive/Obstetrics                            Anesthesia Physical Anesthesia Plan  ASA: IV  Anesthesia Plan: General   Post-op Pain Management:    Induction: Intravenous  PONV Risk Score and Plan: 2 and Treatment may vary due to age or medical condition  Airway Management Planned: Oral ETT  Additional Equipment: Arterial line, CVP, PA Cath, TEE and Ultrasound Guidance Line Placement  Intra-op Plan:   Post-operative Plan:   Informed Consent: I have reviewed the patients History and Physical, chart, labs and discussed the procedure including the risks, benefits and alternatives for the proposed anesthesia with the patient or authorized representative who has indicated his/her understanding and acceptance.     Dental advisory given  Plan Discussed with: CRNA and Surgeon  Anesthesia Plan Comments: (See note by APP A. Kabbe, FNP. )       Anesthesia Quick Evaluation

## 2019-08-11 NOTE — Progress Notes (Signed)
Pre op vascular       has been completed. Preliminary results can be found under CV proc through chart review. Shante Archambeault, BS, RDMS, RVT   

## 2019-08-11 NOTE — Progress Notes (Signed)
Anesthesia Chart Review:   Case: [id number] Date/Time: 08/12/19 0715   Procedures:      CORONARY ARTERY BYPASS GRAFTING (CABG) (N/A Chest)     TRANSESOPHAGEAL ECHOCARDIOGRAM (TEE) (N/A )   Anesthesia type: General   Pre-op diagnosis: CAD   [facility] RO[facility] / [facility] OR   Surgeon: [name], MD      DISCUSSION:  - Pt is a 68 year old male with hx CAD, HTN, DM, iron deficiency anemia.   - Hgb was 9.7 at pre-admission testing 08/08/19; this is improved from prior (hgb was 8.6 on 07/01/19).  Pt has known iron deficiency anemia.  Was started in iron in August by PCP.    VS: BP (!) 154/90   Pulse 70   Temp 36.7 C (Temporal)   Resp 18   Ht 6\' 1"  (1.854 m)   Wt 90.1 kg   SpO2 100%   BMI 26.20 kg/m    PROVIDERS: - PCP is [name], MD - Cardiologist is Lujean Amel, MD   LABS:  - H/H 9.7/33.6.  Pt has known iron deficiency anemia. Hgb is improved from 8.6 a month ago after pt was started on iron by PCP.   (all labs ordered are listed, but only abnormal results are displayed)  Labs Reviewed  GLUCOSE, CAPILLARY - Abnormal; Notable for the following components:      Result Value   Glucose-Capillary 151 (*)    All other components within normal limits  BLOOD GAS, ARTERIAL - Abnormal; Notable for the following components:   pO2, Arterial 122 (*)    Acid-base deficit 2.9 (*)    All other components within normal limits  CBC - Abnormal; Notable for the following components:   Hemoglobin 9.7 (*)    HCT 33.6 (*)    MCV 79.6 (*)    MCH 23.0 (*)    MCHC 28.9 (*)    All other components within normal limits  COMPREHENSIVE METABOLIC PANEL - Abnormal; Notable for the following components:   CO2 20 (*)    Glucose, Bld 145 (*)    All other components within normal limits  HEMOGLOBIN A1C - Abnormal; Notable for the following components:   Hgb A1c MFr Bld 5.9 (*)    All other components within normal limits  URINALYSIS, ROUTINE W REFLEX MICROSCOPIC - Abnormal;  Notable for the following components:   Glucose, UA >=500 (*)    Bacteria, UA RARE (*)    All other components within normal limits  SURGICAL PCR SCREEN  APTT  PROTIME-INR  TYPE AND SCREEN     IMAGES:  CXR 08/08/19: No active cardiopulmonary disease. Chronic elevation right diaphragm with scarring at the right base.    EKG 08/08/19: NSR. Minimal voltage criteria for LVH, may be normal variant T wave abnormality, consider lateral ischemia   CV:  Cardiac cath 07/21/19:   Ost LAD lesion is 50% stenosed.  Mid LAD lesion is 95% stenosed.  Prox RCA lesion is 50% stenosed.  Dist Cx lesion is 90% stenosed.  Mid LM lesion is 25% stenosed.  Prox RCA to Mid RCA lesion is 25% stenosed with 25% stenosed side branch in Acute Mrg. Conclusion:  - Preserved overall left ventricular function ejection fraction 60% - Multivessel coronary disease with in-stent restenosis 95% - Moderate calcification of proximal vessels - 90% distal circumflex - Large RCA 50% also diffuse disease - TIMI-3 flow in all vessels - Recommend referred the patient for evaluation for possible coronary bypass  surgery versus complex intervention   Past Medical History:  Diagnosis Date  . Anginal pain ([place])   . Benign neoplasm of descending colon   . BPH (benign prostatic hyperplasia)   . Coronary artery disease   . Diabetes mellitus without complication ([place])   . Hemorrhage of gastrointestinal tract 07/23/2008  . Hyperlipidemia   . Hypertension   . Iron deficiency anemia   . Myocardial infarction ([place])   . Polyp of sigmoid colon     Past Surgical History:  Procedure Laterality Date  . CARDIAC CATHETERIZATION    . chest surgery for fungal infection    . COLONOSCOPY WITH PROPOFOL N/A 07/10/2017   Procedure: COLONOSCOPY WITH PROPOFOL;  Surgeon: [name], MD;  Location: [facility] ENDOSCOPY;  Service: Endoscopy;  Laterality: N/A;  . ESOPHAGOGASTRODUODENOSCOPY (EGD) WITH PROPOFOL N/A 07/10/2017   Procedure:  ESOPHAGOGASTRODUODENOSCOPY (EGD) WITH PROPOFOL;  Surgeon: [name], MD;  Location: [facility] ENDOSCOPY;  Service: Endoscopy;  Laterality: N/A;  . HERNIA REPAIR    . LEFT HEART CATH AND CORONARY ANGIOGRAPHY Left 07/21/2019   Procedure: LEFT HEART CATH AND CORONARY ANGIOGRAPHY;  Surgeon: [name], MD;  Location: [facility] CV LAB;  Service: Cardiovascular;  Laterality: Left;    MEDICATIONS: . acetaminophen (TYLENOL) 500 MG tablet  . amLODipine (NORVASC) 10 MG tablet  . aspirin 81 MG tablet  . empagliflozin (JARDIANCE) 25 MG TABS tablet  . glipiZIDE (GLUCOTROL) 10 MG tablet  . ibuprofen (ADVIL,MOTRIN) 800 MG tablet  . metFORMIN (GLUCOPHAGE) 500 MG tablet  . metoprolol succinate (TOPROL-XL) 25 MG 24 hr tablet  . oxyCODONE-acetaminophen (PERCOCET) 10-325 MG tablet  . rosuvastatin (CRESTOR) 10 MG tablet  . sildenafil (VIAGRA) 100 MG tablet  . silver sulfADIAZINE (SILVADENE) 1 % cream   No current facility-administered medications for this encounter.    Derrill Memo ON 08/12/2019] dexmedetomidine (PRECEDEX) 400 MCG/100ML (4 mcg/mL) infusion  . [START ON 08/12/2019] DOPamine (INTROPIN) 800 mg in dextrose 5 % 250 mL (3.2 mg/mL) infusion  . [START ON 08/12/2019] EPINEPHrine (ADRENALIN) 4 mg in NS 250 mL (0.016 mg/mL) premix infusion  . [START ON 08/12/2019] heparin 2,500 Units, papaverine 30 mg in electrolyte-148 (PLASMALYTE-148) 500 mL irrigation  . [START ON 08/12/2019] heparin 30,000 units/NS 1000 mL solution for CELLSAVER  . [START ON 08/12/2019] insulin regular, human (MYXREDLIN) 100 units/ 100 mL infusion  . [START ON 08/12/2019] levofloxacin (LEVAQUIN) IVPB 500 mg  . [START ON 08/12/2019] magnesium sulfate (IV Push/IM) injection 40 mEq  . [START ON 08/12/2019] milrinone (PRIMACOR) 20 MG/100 ML (0.2 mg/mL) infusion  . [START ON 08/12/2019] nitroGLYCERIN 50 mg in dextrose 5 % 250 mL (0.2 mg/mL) infusion  . [START ON 08/12/2019] phenylephrine (NEOSYNEPHRINE) 20-0.9 MG/250ML-% infusion  .  [START ON 08/12/2019] potassium chloride injection 80 mEq  . [START ON 08/12/2019] tranexamic acid (CYKLOKAPRON) 2,500 mg in sodium chloride 0.9 % 250 mL (10 mg/mL) infusion  . [START ON 08/12/2019] tranexamic acid (CYKLOKAPRON) bolus via infusion - over 30 minutes 1,351.5 mg  . [START ON 08/12/2019] tranexamic acid (CYKLOKAPRON) pump prime solution 180 mg  . [START ON 08/12/2019] vancomycin (VANCOCIN) 1,000 mg in sodium chloride 0.9 % 1,000 mL irrigation  . [START ON 08/12/2019] vancomycin (VANCOCIN) 1,500 mg in sodium chloride 0.9 % 250 mL IVPB     If no changes, I anticipate pt can proceed with surgery as scheduled.   Willeen Cass, FNP-BC [facility] Short Stay Surgical Center/Anesthesiology Phone: [phone] 08/11/2019 11:52 AM

## 2019-08-12 ENCOUNTER — Inpatient Hospital Stay (HOSPITAL_COMMUNITY): Payer: Medicare HMO | Admitting: Certified Registered Nurse Anesthetist

## 2019-08-12 ENCOUNTER — Inpatient Hospital Stay (HOSPITAL_COMMUNITY): Payer: Medicare HMO

## 2019-08-12 ENCOUNTER — Inpatient Hospital Stay (HOSPITAL_COMMUNITY)
Admission: RE | Disposition: A | Discharge status: Home / Self Care | Payer: Self-pay | Source: Home / Self Care | Attending: Cardiothoracic Surgery

## 2019-08-12 ENCOUNTER — Encounter (HOSPITAL_COMMUNITY): Payer: Self-pay | Admitting: *Deleted

## 2019-08-12 ENCOUNTER — Other Ambulatory Visit: Payer: Self-pay

## 2019-08-12 ENCOUNTER — Inpatient Hospital Stay (HOSPITAL_COMMUNITY)
Admission: RE | Admit: 2019-08-12 | Discharge: 2019-08-17 | DRG: 236 | Disposition: A | Discharge status: Home / Self Care | Payer: Medicare HMO | Attending: Cardiothoracic Surgery | Admitting: Cardiothoracic Surgery

## 2019-08-12 ENCOUNTER — Telehealth: Payer: Self-pay | Admitting: Family Medicine

## 2019-08-12 DIAGNOSIS — E785 Hyperlipidemia, unspecified: Secondary | ICD-10-CM | POA: Diagnosis present

## 2019-08-12 DIAGNOSIS — I2510 Atherosclerotic heart disease of native coronary artery without angina pectoris: Principal | ICD-10-CM | POA: Diagnosis present

## 2019-08-12 DIAGNOSIS — I10 Essential (primary) hypertension: Secondary | ICD-10-CM | POA: Diagnosis present

## 2019-08-12 DIAGNOSIS — Z9889 Other specified postprocedural states: Secondary | ICD-10-CM

## 2019-08-12 DIAGNOSIS — Z09 Encounter for follow-up examination after completed treatment for conditions other than malignant neoplasm: Secondary | ICD-10-CM

## 2019-08-12 DIAGNOSIS — D62 Acute posthemorrhagic anemia: Secondary | ICD-10-CM | POA: Diagnosis not present

## 2019-08-12 DIAGNOSIS — Z951 Presence of aortocoronary bypass graft: Secondary | ICD-10-CM

## 2019-08-12 DIAGNOSIS — I252 Old myocardial infarction: Secondary | ICD-10-CM

## 2019-08-12 DIAGNOSIS — E119 Type 2 diabetes mellitus without complications: Secondary | ICD-10-CM | POA: Diagnosis present

## 2019-08-12 DIAGNOSIS — Z87891 Personal history of nicotine dependence: Secondary | ICD-10-CM | POA: Diagnosis not present

## 2019-08-12 HISTORY — PX: TEE WITHOUT CARDIOVERSION: SHX5443

## 2019-08-12 HISTORY — PX: CORONARY ARTERY BYPASS GRAFT: SHX141

## 2019-08-12 LAB — POCT I-STAT 4, (NA,K, GLUC, HGB,HCT)
Glucose, Bld: 114 mg/dL — ABNORMAL HIGH (ref 70–99)
Glucose, Bld: 146 mg/dL — ABNORMAL HIGH (ref 70–99)
Glucose, Bld: 121 mg/dL — ABNORMAL HIGH (ref 70–99)
Glucose, Bld: 151 mg/dL — ABNORMAL HIGH (ref 70–99)
Glucose, Bld: 156 mg/dL — ABNORMAL HIGH (ref 70–99)
Glucose, Bld: 132 mg/dL — ABNORMAL HIGH (ref 70–99)
HCT: 29 % — ABNORMAL LOW (ref 39.0–52.0)
HCT: 27 % — ABNORMAL LOW (ref 39.0–52.0)
HCT: 22 % — ABNORMAL LOW (ref 39.0–52.0)
HCT: 21 % — ABNORMAL LOW (ref 39.0–52.0)
HCT: 21 % — ABNORMAL LOW (ref 39.0–52.0)
HCT: 21 % — ABNORMAL LOW (ref 39.0–52.0)
Hemoglobin: 9.9 g/dL — ABNORMAL LOW (ref 13.0–17.0)
Hemoglobin: 9.2 g/dL — ABNORMAL LOW (ref 13.0–17.0)
Hemoglobin: 7.5 g/dL — ABNORMAL LOW (ref 13.0–17.0)
Hemoglobin: 7.1 g/dL — ABNORMAL LOW (ref 13.0–17.0)
Hemoglobin: 7.1 g/dL — ABNORMAL LOW (ref 13.0–17.0)
Hemoglobin: 7.1 g/dL — ABNORMAL LOW (ref 13.0–17.0)
Potassium: 3.7 mmol/L (ref 3.5–5.1)
Potassium: 4.3 mmol/L (ref 3.5–5.1)
Potassium: 4.2 mmol/L (ref 3.5–5.1)
Potassium: 5 mmol/L (ref 3.5–5.1)
Potassium: 5.1 mmol/L (ref 3.5–5.1)
Potassium: 4.3 mmol/L (ref 3.5–5.1)
Sodium: 141 mmol/L (ref 135–145)
Sodium: 138 mmol/L (ref 135–145)
Sodium: 141 mmol/L (ref 135–145)
Sodium: 138 mmol/L (ref 135–145)
Sodium: 138 mmol/L (ref 135–145)
Sodium: 140 mmol/L (ref 135–145)

## 2019-08-12 LAB — POCT I-STAT 7, (LYTES, BLD GAS, ICA,H+H)
Acid-base deficit: 2 mmol/L (ref 0.0–2.0)
Acid-base deficit: 4 mmol/L — ABNORMAL HIGH (ref 0.0–2.0)
Acid-base deficit: 5 mmol/L — ABNORMAL HIGH (ref 0.0–2.0)
Acid-base deficit: 6 mmol/L — ABNORMAL HIGH (ref 0.0–2.0)
Bicarbonate: 24 mmol/L (ref 20.0–28.0)
Bicarbonate: 22.4 mmol/L (ref 20.0–28.0)
Bicarbonate: 20.5 mmol/L (ref 20.0–28.0)
Bicarbonate: 19.1 mmol/L — ABNORMAL LOW (ref 20.0–28.0)
Calcium, Ion: 1.04 mmol/L — ABNORMAL LOW (ref 1.15–1.40)
Calcium, Ion: 1.21 mmol/L (ref 1.15–1.40)
Calcium, Ion: 1.19 mmol/L (ref 1.15–1.40)
Calcium, Ion: 1.2 mmol/L (ref 1.15–1.40)
HCT: 23 % — ABNORMAL LOW (ref 39.0–52.0)
HCT: 25 % — ABNORMAL LOW (ref 39.0–52.0)
HCT: 26 % — ABNORMAL LOW (ref 39.0–52.0)
HCT: 29 % — ABNORMAL LOW (ref 39.0–52.0)
Hemoglobin: 7.8 g/dL — ABNORMAL LOW (ref 13.0–17.0)
Hemoglobin: 8.5 g/dL — ABNORMAL LOW (ref 13.0–17.0)
Hemoglobin: 8.8 g/dL — ABNORMAL LOW (ref 13.0–17.0)
Hemoglobin: 9.9 g/dL — ABNORMAL LOW (ref 13.0–17.0)
O2 Saturation: 100 %
O2 Saturation: 99 %
O2 Saturation: 99 %
O2 Saturation: 98 %
Patient temperature: 37
Potassium: 4.2 mmol/L (ref 3.5–5.1)
Potassium: 4.4 mmol/L (ref 3.5–5.1)
Potassium: 4.4 mmol/L (ref 3.5–5.1)
Potassium: 4.1 mmol/L (ref 3.5–5.1)
Sodium: 141 mmol/L (ref 135–145)
Sodium: 140 mmol/L (ref 135–145)
Sodium: 141 mmol/L (ref 135–145)
Sodium: 140 mmol/L (ref 135–145)
TCO2: 25 mmol/L (ref 22–32)
TCO2: 24 mmol/L (ref 22–32)
TCO2: 22 mmol/L (ref 22–32)
TCO2: 20 mmol/L — ABNORMAL LOW (ref 22–32)
pCO2 arterial: 45.8 mmHg (ref 32.0–48.0)
pCO2 arterial: 49.9 mmHg — ABNORMAL HIGH (ref 32.0–48.0)
pCO2 arterial: 38 mmHg (ref 32.0–48.0)
pCO2 arterial: 36.3 mmHg (ref 32.0–48.0)
pH, Arterial: 7.327 — ABNORMAL LOW (ref 7.350–7.450)
pH, Arterial: 7.259 — ABNORMAL LOW (ref 7.350–7.450)
pH, Arterial: 7.339 — ABNORMAL LOW (ref 7.350–7.450)
pH, Arterial: 7.329 — ABNORMAL LOW (ref 7.350–7.450)
pO2, Arterial: 372 mmHg — ABNORMAL HIGH (ref 83.0–108.0)
pO2, Arterial: 158 mmHg — ABNORMAL HIGH (ref 83.0–108.0)
pO2, Arterial: 131 mmHg — ABNORMAL HIGH (ref 83.0–108.0)
pO2, Arterial: 114 mmHg — ABNORMAL HIGH (ref 83.0–108.0)

## 2019-08-12 LAB — CBC
HCT: 29.1 % — ABNORMAL LOW (ref 39.0–52.0)
HCT: 27.4 % — ABNORMAL LOW (ref 39.0–52.0)
Hemoglobin: 8.8 g/dL — ABNORMAL LOW (ref 13.0–17.0)
Hemoglobin: 8.5 g/dL — ABNORMAL LOW (ref 13.0–17.0)
MCH: 24 pg — ABNORMAL LOW (ref 26.0–34.0)
MCH: 24.4 pg — ABNORMAL LOW (ref 26.0–34.0)
MCHC: 30.2 g/dL (ref 30.0–36.0)
MCHC: 31 g/dL (ref 30.0–36.0)
MCV: 79.5 fL — ABNORMAL LOW (ref 80.0–100.0)
MCV: 78.5 fL — ABNORMAL LOW (ref 80.0–100.0)
Platelets: 149 10*3/uL — ABNORMAL LOW (ref 150–400)
Platelets: 139 10*3/uL — ABNORMAL LOW (ref 150–400)
RBC: 3.66 MIL/uL — ABNORMAL LOW (ref 4.22–5.81)
RBC: 3.49 MIL/uL — ABNORMAL LOW (ref 4.22–5.81)
RDW: 15.8 % — ABNORMAL HIGH (ref 11.5–15.5)
RDW: 15.7 % — ABNORMAL HIGH (ref 11.5–15.5)
WBC: 14.7 10*3/uL — ABNORMAL HIGH (ref 4.0–10.5)
WBC: 14.6 10*3/uL — ABNORMAL HIGH (ref 4.0–10.5)
nRBC: 0 % (ref 0.0–0.2)
nRBC: 0 % (ref 0.0–0.2)

## 2019-08-12 LAB — GLUCOSE, CAPILLARY
Glucose-Capillary: 103 mg/dL — ABNORMAL HIGH (ref 70–99)
Glucose-Capillary: 106 mg/dL — ABNORMAL HIGH (ref 70–99)
Glucose-Capillary: 107 mg/dL — ABNORMAL HIGH (ref 70–99)
Glucose-Capillary: 111 mg/dL — ABNORMAL HIGH (ref 70–99)
Glucose-Capillary: 117 mg/dL — ABNORMAL HIGH (ref 70–99)
Glucose-Capillary: 121 mg/dL — ABNORMAL HIGH (ref 70–99)
Glucose-Capillary: 146 mg/dL — ABNORMAL HIGH (ref 70–99)
Glucose-Capillary: 92 mg/dL (ref 70–99)
Glucose-Capillary: 96 mg/dL (ref 70–99)
Glucose-Capillary: 98 mg/dL (ref 70–99)

## 2019-08-12 LAB — HEMOGLOBIN AND HEMATOCRIT, BLOOD
HCT: 20.6 % — ABNORMAL LOW (ref 39.0–52.0)
Hemoglobin: 6.2 g/dL — CL (ref 13.0–17.0)

## 2019-08-12 LAB — BASIC METABOLIC PANEL
Anion gap: 9 (ref 5–15)
BUN: 9 mg/dL (ref 8–23)
CO2: 18 mmol/L — ABNORMAL LOW (ref 22–32)
Calcium: 7.7 mg/dL — ABNORMAL LOW (ref 8.9–10.3)
Chloride: 110 mmol/L (ref 98–111)
Creatinine, Ser: 0.73 mg/dL (ref 0.61–1.24)
GFR calc Af Amer: >60 mL/min (ref >60)
GFR calc non Af Amer: >60 mL/min (ref >60)
Glucose, Bld: 124 mg/dL — ABNORMAL HIGH (ref 70–99)
Potassium: 3.9 mmol/L (ref 3.5–5.1)
Sodium: 137 mmol/L (ref 135–145)

## 2019-08-12 LAB — APTT: aPTT: 33 seconds (ref 24–36)

## 2019-08-12 LAB — PROTIME-INR
INR: 1.4 — ABNORMAL HIGH (ref 0.8–1.2)
Prothrombin Time: 17.2 seconds — ABNORMAL HIGH (ref 11.4–15.2)

## 2019-08-12 LAB — PLATELET COUNT: Platelets: 163 10*3/uL (ref 150–400)

## 2019-08-12 SURGERY — CORONARY ARTERY BYPASS GRAFTING (CABG)
Anesthesia: General | Site: Mouth

## 2019-08-12 MED ORDER — NITROGLYCERIN IN D5W 200-5 MCG/ML-% IV SOLN
0.0000 ug/min | INTRAVENOUS | Status: DC
  Administered 2019-08-13: 50 ug/min via INTRAVENOUS
  Administered 2019-08-14: 01:00:00 100 ug/min via INTRAVENOUS
  Filled 2019-08-12 (×2): qty 250

## 2019-08-12 MED ORDER — INSULIN REGULAR(HUMAN) IN NACL 100-0.9 UT/100ML-% IV SOLN
INTRAVENOUS | Status: DC
  Administered 2019-08-13: 06:00:00 1.8 [IU]/h via INTRAVENOUS

## 2019-08-12 MED ORDER — ACETAMINOPHEN 160 MG/5ML PO SOLN: 650.0000 mg | Freq: Once | ORAL | Status: AC

## 2019-08-12 MED ORDER — LACTATED RINGERS IV SOLN
INTRAVENOUS | Status: DC | PRN
  Administered 2019-08-12: 07:00:00 via INTRAVENOUS

## 2019-08-12 MED ORDER — ASPIRIN EC 325 MG PO TBEC
325.0000 mg | DELAYED_RELEASE_TABLET | Freq: Every day | ORAL | Status: DC
  Administered 2019-08-13: 325 mg via ORAL
  Filled 2019-08-12: qty 1

## 2019-08-12 MED ORDER — ACETAMINOPHEN 650 MG RE SUPP
650.0000 mg | Freq: Once | RECTAL | Status: AC
  Administered 2019-08-12: 650 mg via RECTAL

## 2019-08-12 MED ORDER — CHLORHEXIDINE GLUCONATE CLOTH 2 % EX PADS
6.0000 | MEDICATED_PAD | Freq: Every day | CUTANEOUS | Status: DC
  Administered 2019-08-12 – 2019-08-14 (×3): 6 via TOPICAL

## 2019-08-12 MED ORDER — INSULIN REGULAR BOLUS VIA INFUSION
0.0000 [IU] | Freq: Three times a day (TID) | INTRAVENOUS | Status: DC
  Filled 2019-08-12: qty 10

## 2019-08-12 MED ORDER — CHLORHEXIDINE GLUCONATE 0.12 % MT SOLN
15.0000 mL | Freq: Once | OROMUCOSAL | Status: AC
  Administered 2019-08-12 – 2019-08-13 (×2): 15 mL via OROMUCOSAL
  Filled 2019-08-12: qty 15

## 2019-08-12 MED ORDER — VANCOMYCIN HCL IN DEXTROSE 1-5 GM/200ML-% IV SOLN
1000.0000 mg | Freq: Once | INTRAVENOUS | Status: AC
  Administered 2019-08-12: 21:00:00 1000 mg via INTRAVENOUS
  Filled 2019-08-12: qty 200

## 2019-08-12 MED ORDER — METOPROLOL TARTRATE 25 MG/10 ML ORAL SUSPENSION: 12.5000 mg | Freq: Two times a day (BID) | ORAL | Status: DC

## 2019-08-12 MED ORDER — CHLORHEXIDINE GLUCONATE 0.12 % MT SOLN
15.0000 mL | OROMUCOSAL | Status: AC
  Administered 2019-08-12: 15 mL via OROMUCOSAL
  Filled 2019-08-12: qty 15

## 2019-08-12 MED ORDER — MIDAZOLAM HCL 2 MG/2ML IJ SOLN: 2.0000 mg | INTRAMUSCULAR | Status: DC | PRN

## 2019-08-12 MED ORDER — ALBUMIN HUMAN 5 % IV SOLN
250.0000 mL | INTRAVENOUS | Status: AC | PRN
  Administered 2019-08-12 (×3): 12.5 g via INTRAVENOUS
  Filled 2019-08-12: qty 250

## 2019-08-12 MED ORDER — SODIUM CHLORIDE 0.45 % IV SOLN
INTRAVENOUS | Status: DC | PRN
  Administered 2019-08-12: 14:00:00 via INTRAVENOUS

## 2019-08-12 MED ORDER — METOPROLOL TARTRATE 12.5 MG HALF TABLET: 12.5000 mg | ORAL_TABLET | Freq: Once | ORAL | Status: DC

## 2019-08-12 MED ORDER — SODIUM CHLORIDE (PF) 0.9 % IJ SOLN
INTRAMUSCULAR | Status: AC
  Filled 2019-08-12: qty 10

## 2019-08-12 MED ORDER — VANCOMYCIN HCL 1000 MG IV SOLR
INTRAVENOUS | Status: DC | PRN
  Administered 2019-08-12: 10:00:00 1000 mL

## 2019-08-12 MED ORDER — SODIUM CHLORIDE 0.9% FLUSH: 3.0000 mL | INTRAVENOUS | Status: DC | PRN

## 2019-08-12 MED ORDER — TRAMADOL HCL 50 MG PO TABS: 50.0000 mg | ORAL_TABLET | ORAL | Status: DC | PRN

## 2019-08-12 MED ORDER — FAMOTIDINE IN NACL 20-0.9 MG/50ML-% IV SOLN
20.0000 mg | Freq: Two times a day (BID) | INTRAVENOUS | Status: AC
  Administered 2019-08-12: 15:00:00 20 mg via INTRAVENOUS

## 2019-08-12 MED ORDER — PROTAMINE SULFATE 10 MG/ML IV SOLN
INTRAVENOUS | Status: AC
  Filled 2019-08-12: qty 25

## 2019-08-12 MED ORDER — PHENYLEPHRINE HCL-NACL 20-0.9 MG/250ML-% IV SOLN: 0.0000 ug/min | INTRAVENOUS | Status: DC

## 2019-08-12 MED ORDER — SODIUM CHLORIDE 0.9 % IV SOLN
INTRAVENOUS | Status: DC | PRN
  Administered 2019-08-12: 13:00:00 via INTRAVENOUS

## 2019-08-12 MED ORDER — STERILE WATER FOR INJECTION IJ SOLN
INTRAMUSCULAR | Status: DC | PRN
  Administered 2019-08-12: 08:00:00 1000 mL

## 2019-08-12 MED ORDER — ROCURONIUM BROMIDE 100 MG/10ML IV SOLN: INTRAVENOUS | Status: DC | PRN

## 2019-08-12 MED ORDER — ARTIFICIAL TEARS OPHTHALMIC OINT
TOPICAL_OINTMENT | OPHTHALMIC | Status: AC
  Filled 2019-08-12: qty 3.5

## 2019-08-12 MED ORDER — ACETAMINOPHEN 500 MG PO TABS
1000.0000 mg | ORAL_TABLET | Freq: Four times a day (QID) | ORAL | Status: DC
  Administered 2019-08-12 – 2019-08-17 (×15): 1000 mg via ORAL
  Filled 2019-08-12 (×16): qty 2

## 2019-08-12 MED ORDER — LACTATED RINGERS IV SOLN
INTRAVENOUS | Status: DC | PRN
  Administered 2019-08-12 (×2): via INTRAVENOUS

## 2019-08-12 MED ORDER — METOPROLOL TARTRATE 5 MG/5ML IV SOLN
2.5000 mg | INTRAVENOUS | Status: DC | PRN
  Administered 2019-08-13 (×4): 5 mg via INTRAVENOUS
  Filled 2019-08-12 (×4): qty 5

## 2019-08-12 MED ORDER — LACTATED RINGERS IV SOLN: 500.0000 mL | Freq: Once | INTRAVENOUS | Status: DC | PRN

## 2019-08-12 MED ORDER — DOCUSATE SODIUM 100 MG PO CAPS
200.0000 mg | ORAL_CAPSULE | Freq: Every day | ORAL | Status: DC
  Administered 2019-08-13 – 2019-08-15 (×3): 200 mg via ORAL
  Filled 2019-08-12 (×5): qty 2

## 2019-08-12 MED ORDER — ETOMIDATE 2 MG/ML IV SOLN
INTRAVENOUS | Status: AC
  Filled 2019-08-12: qty 10

## 2019-08-12 MED ORDER — SODIUM CHLORIDE 0.9 % IV SOLN
INTRAVENOUS | Status: DC
  Administered 2019-08-12: 14:00:00 via INTRAVENOUS

## 2019-08-12 MED ORDER — HEMOSTATIC AGENTS (NO CHARGE) OPTIME
TOPICAL | Status: DC | PRN
  Administered 2019-08-12 (×2): 1 via TOPICAL

## 2019-08-12 MED ORDER — PHENYLEPHRINE 40 MCG/ML (10ML) SYRINGE FOR IV PUSH (FOR BLOOD PRESSURE SUPPORT)
PREFILLED_SYRINGE | INTRAVENOUS | Status: AC
  Filled 2019-08-12: qty 10

## 2019-08-12 MED ORDER — MIDAZOLAM HCL (PF) 10 MG/2ML IJ SOLN
INTRAMUSCULAR | Status: AC
  Filled 2019-08-12: qty 2

## 2019-08-12 MED ORDER — CHLORHEXIDINE GLUCONATE 4 % EX LIQD: 30.0000 mL | CUTANEOUS | Status: DC

## 2019-08-12 MED ORDER — ROCURONIUM BROMIDE 10 MG/ML (PF) SYRINGE
PREFILLED_SYRINGE | INTRAVENOUS | Status: DC | PRN
  Administered 2019-08-12: 100 mg via INTRAVENOUS
  Administered 2019-08-12: 20 mg via INTRAVENOUS
  Administered 2019-08-12: 30 mg via INTRAVENOUS
  Administered 2019-08-12: 40 mg via INTRAVENOUS

## 2019-08-12 MED ORDER — ROCURONIUM BROMIDE 10 MG/ML (PF) SYRINGE
PREFILLED_SYRINGE | INTRAVENOUS | Status: AC
  Filled 2019-08-12: qty 10

## 2019-08-12 MED ORDER — EPHEDRINE 5 MG/ML INJ
INTRAVENOUS | Status: AC
  Filled 2019-08-12: qty 10

## 2019-08-12 MED ORDER — LACTATED RINGERS IV SOLN
INTRAVENOUS | Status: DC
  Administered 2019-08-12 – 2019-08-13 (×2): via INTRAVENOUS

## 2019-08-12 MED ORDER — MORPHINE SULFATE (PF) 2 MG/ML IV SOLN
1.0000 mg | INTRAVENOUS | Status: DC | PRN
  Administered 2019-08-12 (×2): 2 mg via INTRAVENOUS
  Administered 2019-08-12: 18:00:00 1 mg via INTRAVENOUS
  Administered 2019-08-13: 4 mg via INTRAVENOUS
  Administered 2019-08-13 (×2): 2 mg via INTRAVENOUS
  Administered 2019-08-14 (×2): 4 mg via INTRAVENOUS
  Filled 2019-08-12: qty 1
  Filled 2019-08-12 (×2): qty 2
  Filled 2019-08-12 (×3): qty 1
  Filled 2019-08-12: qty 2
  Filled 2019-08-12: qty 1

## 2019-08-12 MED ORDER — ACETAMINOPHEN 160 MG/5ML PO SOLN: 1000.0000 mg | Freq: Four times a day (QID) | ORAL | Status: DC

## 2019-08-12 MED ORDER — OXYCODONE HCL 5 MG PO TABS
5.0000 mg | ORAL_TABLET | ORAL | Status: DC | PRN
  Administered 2019-08-13 (×2): 10 mg via ORAL
  Administered 2019-08-13: 05:00:00 5 mg via ORAL
  Administered 2019-08-14 (×2): 10 mg via ORAL
  Administered 2019-08-15: 5 mg via ORAL
  Filled 2019-08-12 (×3): qty 2
  Filled 2019-08-12 (×2): qty 1
  Filled 2019-08-12: qty 2

## 2019-08-12 MED ORDER — 0.9 % SODIUM CHLORIDE (POUR BTL) OPTIME
TOPICAL | Status: DC | PRN
  Administered 2019-08-12: 5000 mL

## 2019-08-12 MED ORDER — SODIUM CHLORIDE (PF) 0.9 % IJ SOLN
OROMUCOSAL | Status: DC | PRN
  Administered 2019-08-12 (×3): 4 mL via TOPICAL

## 2019-08-12 MED ORDER — FENTANYL CITRATE (PF) 250 MCG/5ML IJ SOLN
INTRAMUSCULAR | Status: DC | PRN
  Administered 2019-08-12: 100 ug via INTRAVENOUS
  Administered 2019-08-12: 150 ug via INTRAVENOUS
  Administered 2019-08-12: 50 ug via INTRAVENOUS
  Administered 2019-08-12 (×3): 100 ug via INTRAVENOUS
  Administered 2019-08-12 (×2): 50 ug via INTRAVENOUS
  Administered 2019-08-12: 100 ug via INTRAVENOUS
  Administered 2019-08-12: 50 ug via INTRAVENOUS
  Administered 2019-08-12: 300 ug via INTRAVENOUS
  Administered 2019-08-12 (×2): 50 ug via INTRAVENOUS
  Administered 2019-08-12: 150 ug via INTRAVENOUS

## 2019-08-12 MED ORDER — BISACODYL 10 MG RE SUPP: 10.0000 mg | Freq: Every day | RECTAL | Status: DC

## 2019-08-12 MED ORDER — DEXMEDETOMIDINE HCL IN NACL 200 MCG/50ML IV SOLN
0.0000 ug/kg/h | INTRAVENOUS | Status: DC
  Administered 2019-08-12: 15:00:00 4 ug/kg/h via INTRAVENOUS
  Filled 2019-08-12: qty 50

## 2019-08-12 MED ORDER — METOPROLOL TARTRATE 12.5 MG HALF TABLET
12.5000 mg | ORAL_TABLET | Freq: Two times a day (BID) | ORAL | Status: DC
  Administered 2019-08-12 – 2019-08-13 (×3): 12.5 mg via ORAL
  Filled 2019-08-12 (×3): qty 1

## 2019-08-12 MED ORDER — MIDAZOLAM HCL 5 MG/5ML IJ SOLN
INTRAMUSCULAR | Status: DC | PRN
  Administered 2019-08-12: 1 mg via INTRAVENOUS
  Administered 2019-08-12: 2 mg via INTRAVENOUS
  Administered 2019-08-12: 1 mg via INTRAVENOUS
  Administered 2019-08-12: 3 mg via INTRAVENOUS
  Administered 2019-08-12: 2 mg via INTRAVENOUS
  Administered 2019-08-12: 1 mg via INTRAVENOUS

## 2019-08-12 MED ORDER — HEPARIN SODIUM (PORCINE) 1000 UNIT/ML IJ SOLN
INTRAMUSCULAR | Status: AC
  Filled 2019-08-12: qty 1

## 2019-08-12 MED ORDER — HEPARIN SODIUM (PORCINE) 1000 UNIT/ML IJ SOLN
INTRAMUSCULAR | Status: DC | PRN
  Administered 2019-08-12: 32000 [IU] via INTRAVENOUS

## 2019-08-12 MED ORDER — STERILE WATER FOR IRRIGATION IR SOLN
Status: DC | PRN
  Administered 2019-08-12: 2000 mL

## 2019-08-12 MED ORDER — POTASSIUM CHLORIDE 10 MEQ/50ML IV SOLN: 10.0000 meq | INTRAVENOUS | Status: AC

## 2019-08-12 MED ORDER — HEMOSTATIC AGENTS (NO CHARGE) OPTIME
TOPICAL | Status: DC | PRN
  Administered 2019-08-12: 1 via TOPICAL

## 2019-08-12 MED ORDER — LACTATED RINGERS IV SOLN: INTRAVENOUS | Status: DC

## 2019-08-12 MED ORDER — ARTIFICIAL TEARS OPHTHALMIC OINT
TOPICAL_OINTMENT | OPHTHALMIC | Status: DC | PRN
  Administered 2019-08-12: 1 via OPHTHALMIC

## 2019-08-12 MED ORDER — LEVOFLOXACIN IN D5W 750 MG/150ML IV SOLN
750.0000 mg | INTRAVENOUS | Status: AC
  Administered 2019-08-13: 10:00:00 750 mg via INTRAVENOUS
  Filled 2019-08-12: qty 150

## 2019-08-12 MED ORDER — PROPOFOL 10 MG/ML IV BOLUS
INTRAVENOUS | Status: AC
  Filled 2019-08-12: qty 20

## 2019-08-12 MED ORDER — ASPIRIN 81 MG PO CHEW: 324.0000 mg | CHEWABLE_TABLET | Freq: Every day | ORAL | Status: DC

## 2019-08-12 MED ORDER — ORAL CARE MOUTH RINSE
15.0000 mL | OROMUCOSAL | Status: DC
  Administered 2019-08-12 (×2): 15 mL via OROMUCOSAL

## 2019-08-12 MED ORDER — PROPOFOL 10 MG/ML IV BOLUS
INTRAVENOUS | Status: DC | PRN
  Administered 2019-08-12: 40 mg via INTRAVENOUS
  Administered 2019-08-12: 20 mg via INTRAVENOUS
  Administered 2019-08-12: 50 mg via INTRAVENOUS

## 2019-08-12 MED ORDER — PLASMA-LYTE 148 IV SOLN
INTRAVENOUS | Status: DC | PRN
  Administered 2019-08-12: 500 mL via INTRAVASCULAR

## 2019-08-12 MED ORDER — MAGNESIUM SULFATE 4 GM/100ML IV SOLN
4.0000 g | Freq: Once | INTRAVENOUS | Status: AC
  Administered 2019-08-12: 4 g via INTRAVENOUS
  Filled 2019-08-12: qty 100

## 2019-08-12 MED ORDER — SODIUM CHLORIDE 0.9 % IV SOLN: 250.0000 mL | INTRAVENOUS | Status: DC

## 2019-08-12 MED ORDER — SODIUM CHLORIDE 0.9% FLUSH
3.0000 mL | Freq: Two times a day (BID) | INTRAVENOUS | Status: DC
  Administered 2019-08-13 – 2019-08-17 (×7): 3 mL via INTRAVENOUS

## 2019-08-12 MED ORDER — PROTAMINE SULFATE 10 MG/ML IV SOLN
INTRAVENOUS | Status: AC
  Filled 2019-08-12: qty 5

## 2019-08-12 MED ORDER — EPHEDRINE SULFATE-NACL 50-0.9 MG/10ML-% IV SOSY
PREFILLED_SYRINGE | INTRAVENOUS | Status: DC | PRN
  Administered 2019-08-12: 5 mg via INTRAVENOUS

## 2019-08-12 MED ORDER — FENTANYL CITRATE (PF) 250 MCG/5ML IJ SOLN
INTRAMUSCULAR | Status: AC
  Filled 2019-08-12: qty 25

## 2019-08-12 MED ORDER — FENTANYL CITRATE (PF) 250 MCG/5ML IJ SOLN
INTRAMUSCULAR | Status: AC
  Filled 2019-08-12: qty 5

## 2019-08-12 MED ORDER — PROTAMINE SULFATE 10 MG/ML IV SOLN
INTRAVENOUS | Status: DC | PRN
  Administered 2019-08-12: 320 mg via INTRAVENOUS

## 2019-08-12 MED ORDER — NON FORMULARY
Status: DC | PRN
  Administered 2019-08-12: 30 mL

## 2019-08-12 MED ORDER — CHLORHEXIDINE GLUCONATE 0.12% ORAL RINSE (MEDLINE KIT): 15.0000 mL | Freq: Two times a day (BID) | OROMUCOSAL | Status: DC

## 2019-08-12 MED ORDER — SODIUM BICARBONATE 8.4 % IV SOLN
25.0000 meq | Freq: Once | INTRAVENOUS | Status: AC
  Administered 2019-08-12: 20:00:00 25 meq via INTRAVENOUS

## 2019-08-12 MED ORDER — ONDANSETRON HCL 4 MG/2ML IJ SOLN: 4.0000 mg | Freq: Four times a day (QID) | INTRAMUSCULAR | Status: DC | PRN

## 2019-08-12 MED ORDER — BISACODYL 5 MG PO TBEC
10.0000 mg | DELAYED_RELEASE_TABLET | Freq: Every day | ORAL | Status: DC
  Administered 2019-08-13 – 2019-08-17 (×4): 10 mg via ORAL
  Filled 2019-08-12 (×5): qty 2

## 2019-08-12 MED ORDER — STERILE WATER FOR INJECTION IJ SOLN
INTRAMUSCULAR | Status: AC
  Filled 2019-08-12: qty 10

## 2019-08-12 MED ORDER — PANTOPRAZOLE SODIUM 40 MG PO TBEC
40.0000 mg | DELAYED_RELEASE_TABLET | Freq: Every day | ORAL | Status: DC
  Administered 2019-08-14 – 2019-08-17 (×4): 40 mg via ORAL
  Filled 2019-08-12 (×4): qty 1

## 2019-08-12 SURGICAL SUPPLY — 101 items
ADAPTER CARDIO PERF ANTE/RETRO (ADAPTER) ×3 IMPLANT
ADH SKN CLS APL DERMABOND .7 (GAUZE/BANDAGES/DRESSINGS) ×4
ADPR PRFSN 84XANTGRD RTRGD (ADAPTER) ×2
BAG DECANTER FOR FLEXI CONT (MISCELLANEOUS) ×5 IMPLANT
BASKET HEART (ORDER IN 25'S) (MISCELLANEOUS) ×1
BASKET HEART (ORDER IN 25S) (MISCELLANEOUS) ×2 IMPLANT
BLADE CLIPPER SURG (BLADE) ×3 IMPLANT
BLADE STERNUM SYSTEM 6 (BLADE) ×3 IMPLANT
BNDG CMPR MED 15X6 ELC VLCR LF (GAUZE/BANDAGES/DRESSINGS) ×2
BNDG ELASTIC 4X5.8 VLCR STR LF (GAUZE/BANDAGES/DRESSINGS) ×3 IMPLANT
BNDG ELASTIC 6X15 VLCR STRL LF (GAUZE/BANDAGES/DRESSINGS) ×1 IMPLANT
BNDG ELASTIC 6X5.8 VLCR STR LF (GAUZE/BANDAGES/DRESSINGS) ×3 IMPLANT
BNDG GAUZE ELAST 4 BULKY (GAUZE/BANDAGES/DRESSINGS) ×3 IMPLANT
CANISTER SUCT 3000ML PPV (MISCELLANEOUS) ×3 IMPLANT
CATH CPB KIT HENDRICKSON (MISCELLANEOUS) ×3 IMPLANT
CATH ROBINSON RED A/P 18FR (CATHETERS) ×6 IMPLANT
CLIP RETRACTION 3.0MM CORONARY (MISCELLANEOUS) ×3 IMPLANT
CLIP VESOCCLUDE SM WIDE 24/CT (CLIP) ×2 IMPLANT
CONN ST 1/4X3/8  BEN (MISCELLANEOUS) ×3
CONN ST 1/4X3/8 BEN (MISCELLANEOUS) IMPLANT
COVER PROBE W GEL 5X96 (DRAPES) ×1 IMPLANT
COVER WAND RF STERILE (DRAPES) ×3 IMPLANT
DERMABOND ADVANCED (GAUZE/BANDAGES/DRESSINGS) ×2
DERMABOND ADVANCED .7 DNX12 (GAUZE/BANDAGES/DRESSINGS) ×2 IMPLANT
DRAIN CHANNEL 28F RND 3/8 FF (WOUND CARE) ×11 IMPLANT
DRAPE CARDIOVASCULAR INCISE (DRAPES) ×3
DRAPE SLUSH/WARMER DISC (DRAPES) ×3 IMPLANT
DRAPE SRG 135X102X78XABS (DRAPES) ×2 IMPLANT
DRSG AQUACEL AG ADV 3.5X14 (GAUZE/BANDAGES/DRESSINGS) ×3 IMPLANT
ELECT BLADE 4.0 EZ CLEAN MEGAD (MISCELLANEOUS) ×3
ELECT CAUTERY BLADE 6.4 (BLADE) ×3 IMPLANT
ELECT REM PT RETURN 9FT ADLT (ELECTROSURGICAL) ×9
ELECTRODE BLDE 4.0 EZ CLN MEGD (MISCELLANEOUS) IMPLANT
ELECTRODE REM PT RTRN 9FT ADLT (ELECTROSURGICAL) ×4 IMPLANT
FELT TEFLON 1X6 (MISCELLANEOUS) ×6 IMPLANT
GAUZE SPONGE 4X4 12PLY STRL (GAUZE/BANDAGES/DRESSINGS) ×6 IMPLANT
GLOVE BIOGEL M STER SZ 6 (GLOVE) ×3 IMPLANT
GLOVE BIOGEL M STRL SZ7.5 (GLOVE) ×2 IMPLANT
GLOVE BIOGEL PI IND STRL 6 (GLOVE) IMPLANT
GLOVE BIOGEL PI IND STRL 7.0 (GLOVE) IMPLANT
GLOVE BIOGEL PI IND STRL 7.5 (GLOVE) IMPLANT
GLOVE BIOGEL PI INDICATOR 6 (GLOVE) ×1
GLOVE BIOGEL PI INDICATOR 7.0 (GLOVE) ×3
GLOVE BIOGEL PI INDICATOR 7.5 (GLOVE) ×3
GLOVE NEODERM STRL 7.5 LF PF (GLOVE) ×6 IMPLANT
GLOVE SURG NEODERM 7.5  LF PF (GLOVE) ×3
GOWN STRL REUS W/ TWL LRG LVL3 (GOWN DISPOSABLE) ×8 IMPLANT
GOWN STRL REUS W/TWL LRG LVL3 (GOWN DISPOSABLE) ×30
HEMOSTAT POWDER SURGIFOAM 1G (HEMOSTASIS) ×9 IMPLANT
HEMOSTAT SURGICEL 2X14 (HEMOSTASIS) ×3 IMPLANT
KIT BASIN OR (CUSTOM PROCEDURE TRAY) ×3 IMPLANT
KIT SUCTION CATH 14FR (SUCTIONS) ×3 IMPLANT
KIT TURNOVER KIT B (KITS) ×3 IMPLANT
KIT VASOVIEW HEMOPRO 2 VH 4000 (KITS) ×3 IMPLANT
LEAD PACING MYOCARDI (MISCELLANEOUS) ×3 IMPLANT
MARKER GRAFT CORONARY BYPASS (MISCELLANEOUS) ×9 IMPLANT
NS IRRIG 1000ML POUR BTL (IV SOLUTION) ×15 IMPLANT
PACK E OPEN HEART (SUTURE) ×3 IMPLANT
PACK OPEN HEART (CUSTOM PROCEDURE TRAY) ×3 IMPLANT
PACK SPY-PHI (KITS) ×1 IMPLANT
PAD ARMBOARD 7.5X6 YLW CONV (MISCELLANEOUS) ×6 IMPLANT
PAD ELECT DEFIB RADIOL ZOLL (MISCELLANEOUS) ×3 IMPLANT
PENCIL BUTTON HOLSTER BLD 10FT (ELECTRODE) ×3 IMPLANT
POSITIONER HEAD DONUT 9IN (MISCELLANEOUS) ×3 IMPLANT
PUNCH AORTIC ROTATE 4.0MM (MISCELLANEOUS) ×1 IMPLANT
SEALANT PATCH FIBRIN 2X4IN (MISCELLANEOUS) ×1 IMPLANT
SEALANT SURG COSEAL 4ML (VASCULAR PRODUCTS) ×1 IMPLANT
SET CARDIOPLEGIA MPS 5001102 (MISCELLANEOUS) ×1 IMPLANT
SOL ANTI FOG 6CC (MISCELLANEOUS) IMPLANT
SOLUTION ANTI FOG 6CC (MISCELLANEOUS) ×1
SPONGE LAP 18X18 RF (DISPOSABLE) ×2 IMPLANT
SUT BONE WAX W31G (SUTURE) ×3 IMPLANT
SUT MNCRL AB 3-0 PS2 18 (SUTURE) ×6 IMPLANT
SUT PDS AB 1 CTX 36 (SUTURE) ×6 IMPLANT
SUT PROLENE 3 0 SH DA (SUTURE) ×3 IMPLANT
SUT PROLENE 5 0 C 1 36 (SUTURE) IMPLANT
SUT PROLENE 6 0 C 1 30 (SUTURE) ×9 IMPLANT
SUT PROLENE 7 0 BV1 MDA (SUTURE) ×1 IMPLANT
SUT PROLENE 8 0 BV175 6 (SUTURE) ×1 IMPLANT
SUT PROLENE BLUE 7 0 (SUTURE) ×3 IMPLANT
SUT SILK  1 MH (SUTURE) ×2
SUT SILK 1 MH (SUTURE) IMPLANT
SUT SILK 2 0 SH CR/8 (SUTURE) IMPLANT
SUT SILK 3 0 SH CR/8 (SUTURE) IMPLANT
SUT STEEL 6MS V (SUTURE) ×3 IMPLANT
SUT STEEL SZ 6 DBL 3X14 BALL (SUTURE) ×3 IMPLANT
SUT VIC AB 2-0 CT1 27 (SUTURE) ×3
SUT VIC AB 2-0 CT1 TAPERPNT 27 (SUTURE) IMPLANT
SUT VIC AB 2-0 CTX 27 (SUTURE) IMPLANT
SUT VIC AB 3-0 X1 27 (SUTURE) IMPLANT
SYR 10ML LL (SYRINGE) ×1 IMPLANT
SYSTEM SAHARA CHEST DRAIN ATS (WOUND CARE) ×3 IMPLANT
TAPE CLOTH SURG 4X10 WHT LF (GAUZE/BANDAGES/DRESSINGS) ×2 IMPLANT
TAPE PAPER 2X10 WHT MICROPORE (GAUZE/BANDAGES/DRESSINGS) ×1 IMPLANT
TOWEL GREEN STERILE (TOWEL DISPOSABLE) ×3 IMPLANT
TOWEL GREEN STERILE FF (TOWEL DISPOSABLE) ×3 IMPLANT
TRAY FOLEY SLVR 16FR TEMP STAT (SET/KITS/TRAYS/PACK) ×3 IMPLANT
TUBING LAP HI FLOW INSUFFLATIO (TUBING) ×3 IMPLANT
UNDERPAD 30X30 (UNDERPADS AND DIAPERS) ×3 IMPLANT
WATER STERILE IRR 1000ML POUR (IV SOLUTION) ×6 IMPLANT
WATER STERILE IRR 1000ML UROMA (IV SOLUTION) IMPLANT

## 2019-08-12 NOTE — H&P (Signed)
History and Physical Interval Note:  08/12/2019 3:01 PM  Craig York  has presented today for surgery, with the diagnosis of CAD.  The various methods of treatment have been discussed with the patient and family. After consideration of risks, benefits and other options for treatment, the patient has consented to  Procedure(s) with comments: CORONARY ARTERY BYPASS GRAFTING (CABG)X 4 (N/A) - CABG x  4  using bilateral internal mammary arteries and endoscopically harvested left saphenous vein TRANSESOPHAGEAL ECHOCARDIOGRAM (TEE) (N/A) Indocyanine Green Fluorescence Imaging (Icg) (N/A) as a surgical intervention.  The patient's history has been reviewed, patient examined, no change in status, stable for surgery.  I have reviewed the patient's chart and labs.  Questions were answered to the patient's satisfaction.     [name]   [phone]

## 2019-08-12 NOTE — Procedures (Signed)
Extubation Procedure Note  Patient Details:   Name: Craig York DOB: 10/24/1951 MRN: [id number]   Airway Documentation:  Airway 8 mm (Active)  Secured at (cm) 23 cm 08/12/19 1936  Measured From Lips 08/12/19 1936  Secured Location Right 08/12/19 1936  Secured By Pink Tape 08/12/19 1936  Site Condition Dry 08/12/19 1936   Vent end date: 08/12/19 Vent end time: 2030   Evaluation  O2 sats: stable throughout Complications: No apparent complications Patient did tolerate procedure well. Bilateral Breath Sounds: Clear, Diminished  NIF=-30 VC=.600 No stridor heard over upper airway, placed patient on 5 lpm after extubation.  Yes  Ulice Dash 08/12/2019, 8:41 PM

## 2019-08-12 NOTE — Anesthesia Procedure Notes (Signed)
Central Venous Catheter Insertion Performed by: Oleta Mouse, MD, anesthesiologist Start/End9/15/2020 7:02 AM, 08/12/2019 7:14 AM Patient location: Pre-op. Preanesthetic checklist: patient identified, IV checked, site marked, risks and benefits discussed, surgical consent, monitors and equipment checked, pre-op evaluation, timeout performed and anesthesia consent Hand hygiene performed  and maximum sterile barriers used  PA cath was placed.Swan type:thermodilution Procedure performed without using ultrasound guided technique. Attempts: 1 Patient tolerated the procedure well with no immediate complications.

## 2019-08-12 NOTE — Anesthesia Procedure Notes (Signed)
Arterial Line Insertion Start/End9/15/2020 7:10 AM Performed by: Janene Harvey, CRNA, CRNA  Patient location: Pre-op. Preanesthetic checklist: patient identified, IV checked, site marked, risks and benefits discussed, surgical consent, monitors and equipment checked, pre-op evaluation, timeout performed and anesthesia consent Lidocaine 1% used for infiltration Left, radial was placed Catheter size: 20 G Hand hygiene performed  and maximum sterile barriers used   Attempts: 1 Procedure performed without using ultrasound guided technique. Following insertion, dressing applied and Biopatch. Post procedure assessment: normal and unchanged  Patient tolerated the procedure well with no immediate complications.

## 2019-08-12 NOTE — Progress Notes (Signed)
  Echocardiogram Echocardiogram Transesophageal has been performed.  Jannett Celestine 08/12/2019, 8:40 AM

## 2019-08-12 NOTE — Progress Notes (Signed)
This note also relates to the following rows which could not be included: SpO2 - Cannot attach notes to unvalidated device data  RT note- Patient not able to extubate at this time, RR >30 will not do FVC well, however NIF-35, ABG WNL, recommended by Dr. Richardine Service to rest one more hour.

## 2019-08-12 NOTE — Anesthesia Procedure Notes (Signed)
Central Venous Catheter Insertion Performed by: Oleta Mouse, MD, anesthesiologist Start/End9/15/2020 7:02 AM, 08/12/2019 7:14 AM Patient location: Pre-op. Preanesthetic checklist: patient identified, IV checked, site marked, risks and benefits discussed, surgical consent, monitors and equipment checked, pre-op evaluation, timeout performed and anesthesia consent Lidocaine 1% used for infiltration and patient sedated Hand hygiene performed  and maximum sterile barriers used  Catheter size: 8.5 Fr Sheath introducer Procedure performed using ultrasound guided technique. Ultrasound Notes:anatomy identified, needle tip was noted to be adjacent to the nerve/plexus identified, no ultrasound evidence of intravascular and/or intraneural injection and image(s) printed for medical record Attempts: 1 Following insertion, line sutured, dressing applied and Biopatch. Post procedure assessment: blood return through all ports, free fluid flow and no air  Patient tolerated the procedure well with no immediate complications.

## 2019-08-12 NOTE — Brief Op Note (Signed)
08/12/2019  3:01 PM  PATIENT:  Craig York  68 y.o. male  PRE-OPERATIVE DIAGNOSIS:  CAD  POST-OPERATIVE DIAGNOSIS:  CAD  PROCEDURE:  Procedure(s) with comments: CORONARY ARTERY BYPASS GRAFTING (CABG)X 4 (N/A) - CABG x  4  using bilateral internal mammary arteries and endoscopically harvested left saphenous vein TRANSESOPHAGEAL ECHOCARDIOGRAM (TEE) (N/A) Indocyanine Green Fluorescence Imaging (Icg) (N/A)  SURGEON:  Surgeon(s) and Role:    * [name], MD - Primary  PHYSICIAN ASSISTANT:   ASSISTANTS: Jadene Pierini PA-C   ANESTHESIA:   general  EBL: per anes  BLOOD ADMINISTERED:450 CC PRBC  DRAINS: 4 Chest Tube(s) in the mediastinum and pleural spaces   LOCAL MEDICATIONS USED:  NONE  SPECIMEN:  No Specimen  DISPOSITION OF SPECIMEN:  N/A  COUNTS:  YES  TOURNIQUET:  * No tourniquets in log *  DICTATION: .Note written in EPIC  PLAN OF CARE: Admit to inpatient   PATIENT DISPOSITION:  ICU - intubated and hemodynamically stable.   Delay start of Pharmacological VTE agent (>24hrs) due to surgical blood loss or risk of bleeding: yes

## 2019-08-12 NOTE — Brief Op Note (Signed)
08/12/2019  8:53 AM  PATIENT:  [name]  68 y.o. male  PRE-OPERATIVE DIAGNOSIS:  CAD  POST-OPERATIVE DIAGNOSIS:  CAD  PROCEDURE:  Procedure(s) with comments: CORONARY ARTERY BYPASS GRAFTING (CABG)X 4 (N/A) - CABG x  4  using bilateral internal mammary arteries and endoscopically harvested left saphenous vein TRANSESOPHAGEAL ECHOCARDIOGRAM (TEE) (N/A) Indocyanine Green Fluorescence Imaging (Icg) (N/A)  SURGEON:  Surgeon(s) and Role:    * [name], MD - Primary  PHYSICIAN ASSISTANT: WAYNE GOLD PA-C  ANESTHESIA:   general  EBL: see anesthesia/perfusion records  BLOOD ADMINISTERED:see anesthesia and perfusion records  DRAINS: 2 PLEURAL AND 1 MEDIASTINAL CHEST TUBES   LOCAL MEDICATIONS USED:  NONE  SPECIMEN:  No Specimen  DISPOSITION OF SPECIMEN:  N/A  COUNTS:  YES  TOURNIQUET:  * No tourniquets in log *  DICTATION: .Dragon Dictation  PLAN OF CARE: Admit to inpatient   PATIENT DISPOSITION:  ICU - intubated and hemodynamically stable.   Delay start of Pharmacological VTE agent (>24hrs) due to surgical blood loss or risk of bleeding: yes  COMPLICATIONS: NO KNOWN

## 2019-08-12 NOTE — Anesthesia Procedure Notes (Signed)
Procedure Name: Intubation Date/Time: 08/12/2019 7:50 AM Performed by: Janene Harvey, CRNA Pre-anesthesia Checklist: Patient identified, Emergency Drugs available, Suction available and Patient being monitored Patient Re-evaluated:Patient Re-evaluated prior to induction Oxygen Delivery Method: Circle system utilized Preoxygenation: Pre-oxygenation with 100% oxygen Induction Type: IV induction Ventilation: Mask ventilation without difficulty Laryngoscope Size: Mac and 4 Grade View: Grade II Tube type: Oral Tube size: 8.0 mm Number of attempts: 1 Airway Equipment and Method: Stylet and Oral airway Placement Confirmation: ETT inserted through vocal cords under direct vision,  positive ETCO2 and breath sounds checked- equal and bilateral Secured at: 23 cm Tube secured with: Tape Dental Injury: Teeth and Oropharynx as per pre-operative assessment

## 2019-08-12 NOTE — Op Note (Signed)
CARDIOTHORACIC SURGERY OPERATIVE NOTE  Date of Procedure: 08/12/2019  Preoperative Diagnosis: Severe 3-vessel Coronary Artery Disease, s/p NSTEMI  Postoperative Diagnosis: Same  Procedure:    Coronary Artery Bypass Grafting x 4   Left Internal Mammary Artery to Distal Left Anterior Descending Coronary Artery;  Reverse Saphenous Vein Graft to Posterior Descending Coronary Artery; Free right Internal mammary artery Graft to  Obtuse Marginal Branch of Left Circumflex Coronary Artery; Reverse Sapheonous Vein Graft to  Diagonal Branch Coronary Artery; Endoscopic Vein Harvest from right Thigh; bilateral IMA harvesting; epiaortic ultrasonography; completion indocyanine green flourescence imaging (SPY)  Surgeon: B. Murvin Natal, MD  Assistant: Jadene Pierini PA-C  Anesthesia: get  Operative Findings:  Preserved  left ventricular systolic function  good quality  internal mammary artery conduits  good quality saphenous vein conduit  good quality target vessels for grafting    BRIEF CLINICAL NOTE AND INDICATIONS FOR SURGERY  68 yo man experienced NSTEMI in 8/20. Work-up demonstrated multivessel CAD including complicated LAD disease. Referred for CABG. He has undergone thorough work-up and is an acceptable candidate for surgery, and it is estimated that he will derive benefit from CABG.    DETAILS OF THE OPERATIVE PROCEDURE  Preparation:  The patient is brought to the operating room on the above mentioned date and central monitoring was established by the anesthesia team including placement of Swan-Ganz catheter and radial arterial line. The patient is placed in the supine position on the operating table.  Intravenous antibiotics are administered. General endotracheal anesthesia is induced uneventfully. A Foley catheter is placed.  Baseline transesophageal echocardiogram was performed.  Findings were notable for thickened LV, preserved biventricular function; no significant valve disease.    The patient's chest, abdomen, both groins, and both lower extremities are prepared and draped in a sterile manner. A time out procedure is performed.   Surgical Approach and Conduit Harvest:  A median sternotomy incision was performed and the left internal mammary artery is dissected from the chest wall and prepared for bypass grafting. The left internal mammary artery is notably good quality conduit. Simultaneously, the greater saphenous vein is obtained from the patient's right thigh using endoscopic vein harvest technique. The saphenous vein is notablygood quality conduit. After removal of the saphenous vein, the small surgical incisions in the lower extremity are closed with absorbable suture. After mobilizing the LIMA, attention is turned to the right IMA which is also mobilized in a standard fashion and harvested as a free graft.  Following systemic heparinization, the left internal mammary artery was transected distally noted to have excellent flow.   Extracorporeal Cardiopulmonary Bypass and Myocardial Protection:  The pericardium is opened. The ascending aorta is nondiseased in appearance. The ascending aorta and the right atrium are cannulated for cardiopulmonary bypass.  Adequate heparinization is verified.   The entire pre-bypass portion of the operation was notable for stable hemodynamics.  Cardiopulmonary bypass was begun and the surface of the heart is inspected. Distal target vessels are selected for coronary artery bypass grafting. A cardioplegia cannula is placed in the ascending aorta.  A temperature probe was placed in the interventricular septum.  The patient is allowed to cool passively to 34 C systemic temperature.  The aortic cross clamp is applied and cold blood cardioplegia is delivered initially in an antegrade fashion through the aortic root.   Iced saline slush is applied for topical hypothermia.  The initial cardioplegic arrest is rapid with early diastolic arrest.   Repeat doses of cardioplegia are administered intermittently throughout the  entire cross clamp portion of the operation through the aortic root, and through subsequently placed vein grafts in order to maintain completely flat electrocardiogram.   Myocardial protection was felt to be excellent.   Coronary Artery Bypass Grafting:   The  posterior descending branch of the right coronary artery was grafted using a reversed saphenous vein graft in an end-to-side fashion.  At the site of distal anastomosis the target vessel was fair quality due to posterior plaque and measured approximately 1.5 mm in diameter. Anastomotic patency and runoff was confirmed with indocyanine green fluorescence imaging (SPY).  The  obtuse marginal branch of the left circumflex coronary artery was grafted using the free right IMA graft in an end-to-side fashion.  At the site of distal anastomosis the target vessel was good quality and measured approximately 2 mm in diameter. Anastomotic patency and runoff was confirmed with indocyanine green fluorescence imaging (SPY).  The  diagonal branch of the left anterior descending coronary artery was grafted using a reversed saphenous vein graft in an end-to-side fashion.  At the site of distal anastomosis the target vessel was good quality and measured approximately 1.5 mm in diameter.  The distal left anterior coronary artery was grafted with the left internal mammary artery in an end-to-side fashion.  At the site of distal anastomosis the target vessel was good quality and measured approximately 2.0 mm in diameter. Anastomotic patency and runoff was confirmed with indocyanine green fluorescence imaging (SPY).  All proximal vein graft anastomoses were placed directly to the ascending aorta prior to removal of the aortic cross clamp. The free RIMA graft was anastomosed to the hood of the diagonal vein graft.  The aortic cross clamp was removed after a total cross clamp time of 92  minutes.   Procedure Completion:  All proximal and distal coronary anastomoses were inspected for hemostasis and appropriate graft orientation. Epicardial pacing wires are fixed to the right ventricular outflow tract and to the right atrial appendage. The patient is rewarmed to 37C temperature. The patient is weaned and disconnected from cardiopulmonary bypass.  The patient's rhythm at separation from bypass was sinus bradycardia.  The patient was weaned from cardiopulmonary bypass  without any inotropic support.  Followup transesophageal echocardiogram performed after separation from bypass revealed  no changes from the preoperative exam.  The aortic and venous cannula were removed uneventfully. Protamine was administered to reverse the anticoagulation. The mediastinum and pleural space were inspected for hemostasis and irrigated with saline solution. The mediastinum and bilateral pleural space were drained using fluted chest tubes. The sternum is closed with double strength sternal wire. The soft tissues anterior to the sternum were closed in multiple layers and the skin is closed with a running subcuticular skin closure.  The post-bypass portion of the operation was notable for stable rhythm and hemodynamics.  One unit of PRBC was administered during the operation.   Disposition:  The patient tolerated the procedure well and is transported to the surgical intensive care in stable condition. There are no intraoperative complications. All sponge instrument and needle counts are verified correct at completion of the operation.    Jayme Cloud, MD 08/12/2019 8:07 PM

## 2019-08-12 NOTE — Transfer of Care (Signed)
Immediate Anesthesia Transfer of Care Note  Patient: MCGWIRE GAEBLER  Procedure(s) Performed: CORONARY ARTERY BYPASS GRAFTING (CABG)X 4 (N/A Chest) TRANSESOPHAGEAL ECHOCARDIOGRAM (TEE) (N/A Mouth) Indocyanine Green Fluorescence Imaging (Icg) (N/A Chest)  Patient Location: ICU  Anesthesia Type:General  Level of Consciousness: sedated and Patient remains intubated per anesthesia plan  Airway & Oxygen Therapy: Patient remains intubated per anesthesia plan and Patient placed on Ventilator (see vital sign flow sheet for setting)  Post-op Assessment: Report given to RN and Post -op Vital signs reviewed and stable  Post vital signs: Reviewed  Last Vitals:  Vitals Value Taken Time  BP    Temp 36.6 C 08/12/19 1434  Pulse 80 08/12/19 1434  Resp 12 08/12/19 1434  SpO2 100 % 08/12/19 1434  Vitals shown include unvalidated device data.  Last Pain:  Vitals:   08/12/19 1430  TempSrc: Core  PainSc:       Patients Stated Pain Goal: 3 ([id number])  Complications: No apparent anesthesia complications

## 2019-08-12 NOTE — Progress Notes (Signed)
Patient ID: SEMAJ TEEMS, male   DOB: September 27, 1951, 68 y.o.   MRN: [id number] EVENING ROUNDS NOTE :     [address]       [place], [address]             [phone]                 Day of Surgery Procedure(s) (LRB): CORONARY ARTERY BYPASS GRAFTING (CABG)X 4 (N/A) TRANSESOPHAGEAL ECHOCARDIOGRAM (TEE) (N/A) Indocyanine Green Fluorescence Imaging (Icg) (N/A)  Total Length of Stay:  LOS: 0 days  BP 113/88   Pulse 92   Temp 98.1 F (36.7 C)   Resp (!) 22   Ht 6\' 1"  (1.854 m)   Wt 90.1 kg   SpO2 100%   BMI 26.20 kg/m   .Intake/Output      09/14 0701 - 09/15 0700 09/15 0701 - 09/16 0700   I.V. (mL/kg)  3632.7 (40.3)   Blood  470   IV Piggyback  23.2   Total Intake(mL/kg)  4125.9 (45.8)   Urine (mL/kg/hr)  1305 (1.3)   Chest Tube  114   Total Output  1419   Net  +2706.9          . sodium chloride Stopped (08/12/19 1512)  . [START ON 08/13/2019] sodium chloride    . sodium chloride 20 mL/hr at 08/12/19 1425  . albumin human 12.5 g (08/12/19 1700)  . dexmedetomidine (PRECEDEX) IV infusion 0.4 mcg/kg/hr (08/12/19 1600)  . famotidine (PEPCID) IV 20 mg (08/12/19 1456)  . insulin 3.6 mL/hr at 08/12/19 1600  . lactated ringers    . lactated ringers 20 mL/hr at 08/12/19 1741  . lactated ringers 20 mL/hr at 08/12/19 1600  . [START ON 08/13/2019] levofloxacin (LEVAQUIN) IV    . magnesium sulfate 20 mL/hr at 08/12/19 1600  . nitroGLYCERIN 0 mcg/min (08/12/19 1425)  . phenylephrine (NEO-SYNEPHRINE) Adult infusion 15 mcg/min (08/12/19 1425)  . vancomycin       Lab Results  Component Value Date   WBC 14.7 (H) 08/12/2019   HGB 8.8 (L) 08/12/2019   HCT 29.1 (L) 08/12/2019   PLT 149 (L) 08/12/2019   GLUCOSE 132 (H) 08/12/2019   CHOL 137 07/01/2019   TRIG 120 07/01/2019   HDL 41 07/01/2019   LDLCALC 72 07/01/2019   ALT 26 08/08/2019   AST 24 08/08/2019   NA 141 08/12/2019   K 4.4 08/12/2019   CL 110 08/08/2019   CREATININE 0.92 08/08/2019   BUN 10  08/08/2019   CO2 20 (L) 08/08/2019   TSH 1.072Test methodology is 3rd generation TSH 02/04/2008   INR 1.4 (H) 08/12/2019   HGBA1C 5.9 (H) 08/08/2019   MICROALBUR 50 11/06/2017   Weaning vent close to extubation Not bleeding Not on pressors    Grace Isaac MD  Beeper [phone] Office [phone] 08/12/2019 5:50 PM

## 2019-08-13 ENCOUNTER — Inpatient Hospital Stay (HOSPITAL_COMMUNITY): Payer: Medicare HMO

## 2019-08-13 LAB — GLUCOSE, CAPILLARY
Glucose-Capillary: 101 mg/dL — ABNORMAL HIGH (ref 70–99)
Glucose-Capillary: 105 mg/dL — ABNORMAL HIGH (ref 70–99)
Glucose-Capillary: 110 mg/dL — ABNORMAL HIGH (ref 70–99)
Glucose-Capillary: 112 mg/dL — ABNORMAL HIGH (ref 70–99)
Glucose-Capillary: 114 mg/dL — ABNORMAL HIGH (ref 70–99)
Glucose-Capillary: 119 mg/dL — ABNORMAL HIGH (ref 70–99)
Glucose-Capillary: 119 mg/dL — ABNORMAL HIGH (ref 70–99)
Glucose-Capillary: 121 mg/dL — ABNORMAL HIGH (ref 70–99)
Glucose-Capillary: 122 mg/dL — ABNORMAL HIGH (ref 70–99)
Glucose-Capillary: 132 mg/dL — ABNORMAL HIGH (ref 70–99)
Glucose-Capillary: 133 mg/dL — ABNORMAL HIGH (ref 70–99)
Glucose-Capillary: 140 mg/dL — ABNORMAL HIGH (ref 70–99)
Glucose-Capillary: 142 mg/dL — ABNORMAL HIGH (ref 70–99)
Glucose-Capillary: 158 mg/dL — ABNORMAL HIGH (ref 70–99)
Glucose-Capillary: 161 mg/dL — ABNORMAL HIGH (ref 70–99)
Glucose-Capillary: 202 mg/dL — ABNORMAL HIGH (ref 70–99)

## 2019-08-13 LAB — CBC
HCT: 25.4 % — ABNORMAL LOW (ref 39.0–52.0)
HCT: 26.1 % — ABNORMAL LOW (ref 39.0–52.0)
Hemoglobin: 7.6 g/dL — ABNORMAL LOW (ref 13.0–17.0)
Hemoglobin: 7.8 g/dL — ABNORMAL LOW (ref 13.0–17.0)
MCH: 23.8 pg — ABNORMAL LOW (ref 26.0–34.0)
MCH: 23.8 pg — ABNORMAL LOW (ref 26.0–34.0)
MCHC: 29.9 g/dL — ABNORMAL LOW (ref 30.0–36.0)
MCHC: 29.9 g/dL — ABNORMAL LOW (ref 30.0–36.0)
MCV: 79.4 fL — ABNORMAL LOW (ref 80.0–100.0)
MCV: 79.6 fL — ABNORMAL LOW (ref 80.0–100.0)
Platelets: 152 10*3/uL (ref 150–400)
Platelets: 163 10*3/uL (ref 150–400)
RBC: 3.2 MIL/uL — ABNORMAL LOW (ref 4.22–5.81)
RBC: 3.28 MIL/uL — ABNORMAL LOW (ref 4.22–5.81)
RDW: 15.6 % — ABNORMAL HIGH (ref 11.5–15.5)
RDW: 15.9 % — ABNORMAL HIGH (ref 11.5–15.5)
WBC: 13.5 10*3/uL — ABNORMAL HIGH (ref 4.0–10.5)
WBC: 20.2 10*3/uL — ABNORMAL HIGH (ref 4.0–10.5)
nRBC: 0 % (ref 0.0–0.2)
nRBC: 0 % (ref 0.0–0.2)

## 2019-08-13 LAB — BASIC METABOLIC PANEL
Anion gap: 8 (ref 5–15)
Anion gap: 10 (ref 5–15)
BUN: 11 mg/dL (ref 8–23)
BUN: 12 mg/dL (ref 8–23)
CO2: 20 mmol/L — ABNORMAL LOW (ref 22–32)
CO2: 19 mmol/L — ABNORMAL LOW (ref 22–32)
Calcium: 7.9 mg/dL — ABNORMAL LOW (ref 8.9–10.3)
Calcium: 8.2 mg/dL — ABNORMAL LOW (ref 8.9–10.3)
Chloride: 109 mmol/L (ref 98–111)
Chloride: 105 mmol/L (ref 98–111)
Creatinine, Ser: 0.78 mg/dL (ref 0.61–1.24)
Creatinine, Ser: 0.9 mg/dL (ref 0.61–1.24)
GFR calc Af Amer: >60 mL/min (ref >60)
GFR calc Af Amer: >60 mL/min (ref >60)
GFR calc non Af Amer: >60 mL/min (ref >60)
GFR calc non Af Amer: >60 mL/min (ref >60)
Glucose, Bld: 115 mg/dL — ABNORMAL HIGH (ref 70–99)
Glucose, Bld: 192 mg/dL — ABNORMAL HIGH (ref 70–99)
Potassium: 4.1 mmol/L (ref 3.5–5.1)
Potassium: 3.9 mmol/L (ref 3.5–5.1)
Sodium: 137 mmol/L (ref 135–145)
Sodium: 134 mmol/L — ABNORMAL LOW (ref 135–145)

## 2019-08-13 LAB — MAGNESIUM
Magnesium: 2.4 mg/dL (ref 1.7–2.4)
Magnesium: 2.2 mg/dL (ref 1.7–2.4)

## 2019-08-13 LAB — POCT I-STAT 7, (LYTES, BLD GAS, ICA,H+H)
Acid-base deficit: 6 mmol/L — ABNORMAL HIGH (ref 0.0–2.0)
Bicarbonate: 18.2 mmol/L — ABNORMAL LOW (ref 20.0–28.0)
Calcium, Ion: 1.16 mmol/L (ref 1.15–1.40)
HCT: 27 % — ABNORMAL LOW (ref 39.0–52.0)
Hemoglobin: 9.2 g/dL — ABNORMAL LOW (ref 13.0–17.0)
O2 Saturation: 99 %
Patient temperature: 98.6
Potassium: 3.9 mmol/L (ref 3.5–5.1)
Sodium: 140 mmol/L (ref 135–145)
TCO2: 19 mmol/L — ABNORMAL LOW (ref 22–32)
pCO2 arterial: 31.9 mmHg — ABNORMAL LOW (ref 32.0–48.0)
pH, Arterial: 7.363 (ref 7.350–7.450)
pO2, Arterial: 127 mmHg — ABNORMAL HIGH (ref 83.0–108.0)

## 2019-08-13 MED ORDER — HYDRALAZINE HCL 20 MG/ML IJ SOLN
10.0000 mg | Freq: Four times a day (QID) | INTRAMUSCULAR | Status: DC | PRN
  Administered 2019-08-13: 10 mg via INTRAVENOUS
  Filled 2019-08-13 (×2): qty 1

## 2019-08-13 MED ORDER — CLOPIDOGREL BISULFATE 75 MG PO TABS
75.0000 mg | ORAL_TABLET | Freq: Every day | ORAL | Status: DC
  Administered 2019-08-13 – 2019-08-17 (×5): 75 mg via ORAL
  Filled 2019-08-13 (×5): qty 1

## 2019-08-13 MED ORDER — INSULIN ASPART 100 UNIT/ML ~~LOC~~ SOLN
0.0000 [IU] | SUBCUTANEOUS | Status: DC
  Administered 2019-08-13: 8 [IU] via SUBCUTANEOUS
  Administered 2019-08-13: 4 [IU] via SUBCUTANEOUS
  Administered 2019-08-13 – 2019-08-14 (×4): 2 [IU] via SUBCUTANEOUS
  Administered 2019-08-14: 08:00:00 4 [IU] via SUBCUTANEOUS
  Administered 2019-08-14 – 2019-08-15 (×5): 2 [IU] via SUBCUTANEOUS

## 2019-08-13 MED ORDER — AMIODARONE LOAD VIA INFUSION
150.0000 mg | Freq: Once | INTRAVENOUS | Status: AC
  Administered 2019-08-13: 150 mg via INTRAVENOUS
  Filled 2019-08-13: qty 83.34

## 2019-08-13 MED ORDER — ASPIRIN EC 81 MG PO TBEC
81.0000 mg | DELAYED_RELEASE_TABLET | Freq: Every day | ORAL | Status: DC
  Administered 2019-08-14 – 2019-08-17 (×4): 81 mg via ORAL
  Filled 2019-08-13 (×4): qty 1

## 2019-08-13 MED ORDER — AMIODARONE HCL IN DEXTROSE 360-4.14 MG/200ML-% IV SOLN
30.0000 mg/h | INTRAVENOUS | Status: DC
  Administered 2019-08-14: 05:00:00 30 mg/h via INTRAVENOUS
  Filled 2019-08-13: qty 200

## 2019-08-13 MED ORDER — ORAL CARE MOUTH RINSE
15.0000 mL | Freq: Two times a day (BID) | OROMUCOSAL | Status: DC
  Administered 2019-08-13 – 2019-08-15 (×5): 15 mL via OROMUCOSAL

## 2019-08-13 MED ORDER — ISOSORBIDE DINITRATE 10 MG PO TABS
10.0000 mg | ORAL_TABLET | Freq: Three times a day (TID) | ORAL | Status: DC
  Administered 2019-08-13 – 2019-08-17 (×13): 10 mg via ORAL
  Filled 2019-08-13 (×17): qty 1

## 2019-08-13 MED ORDER — AMIODARONE HCL IN DEXTROSE 360-4.14 MG/200ML-% IV SOLN
60.0000 mg/h | INTRAVENOUS | Status: DC
  Administered 2019-08-13 (×2): 60 mg/h via INTRAVENOUS
  Filled 2019-08-13 (×2): qty 200

## 2019-08-13 MED FILL — Water For IV Injection: INTRAVENOUS | Qty: 1000 | Status: AC

## 2019-08-13 MED FILL — Water For Injection: INTRAMUSCULAR | Qty: 20 | Status: AC

## 2019-08-13 MED FILL — Indocyanine Green For IV Soln 25 MG: INTRAVENOUS | Qty: 25 | Status: AC

## 2019-08-13 NOTE — Plan of Care (Signed)
Pt resting in bed working on incentive spirometer. Pt educated on today's plan of care. Will continue to monitor

## 2019-08-13 NOTE — Progress Notes (Signed)
      [address]       [place] [address]             [phone]      POD # 1 CABG x 4 Resting comfortably, no complaints  BP 138/76   Pulse 86   Temp 99.1 F (37.3 C)   Resp (!) 35   Ht 6\' 1"  (1.854 m)   Wt 90.9 kg   SpO2 96%   BMI 26.44 kg/m  Creatinine 0.9, K= 3.9 Hct= 26  Started on amiodarone, in SR currently  [place] C. Roxan Hockey, MD [facility] [phone]

## 2019-08-13 NOTE — Progress Notes (Signed)
1 Day Post-Op Procedure(s) (LRB): CORONARY ARTERY BYPASS GRAFTING (CABG)X 4 (N/A) TRANSESOPHAGEAL ECHOCARDIOGRAM (TEE) (N/A) Indocyanine Green Fluorescence Imaging (Icg) (N/A) Subjective: Feeling well; no complaints Objective: Vital signs in last 24 hours: Temp:  [97.5 F (36.4 C)-99.5 F (37.5 C)] 99.3 F (37.4 C) (09/16 0800) Pulse Rate:  [72-96] 73 (09/16 0800) Cardiac Rhythm: Atrial paced (09/16 0739) Resp:  [12-35] 23 (09/16 0800) BP: (101-148)/(69-98) 124/73 (09/16 0800) SpO2:  [92 %-100 %] 95 % (09/16 0800) Arterial Line BP: (93-204)/(53-197) 168/66 (09/16 0800) FiO2 (%):  [40 %-50 %] 40 % (09/15 2000) Weight:  [90.9 kg] 90.9 kg (09/16 0600)  Hemodynamic parameters for last 24 hours: PAP: (22-58)/(8-24) 35/19 CO:  [3.7 L/min-5.4 L/min] 5.4 L/min CI:  [1.7 L/min/m2-2.5 L/min/m2] 2.5 L/min/m2  Intake/Output from previous day: 09/15 0701 - 09/16 0700 In: 4977.6 [P.O.:240; I.V.:3982.2; Blood:470; IV Piggyback:285.3] Out: 2321 [Urine:1930; Emesis/NG output:10; Chest Tube:381] Intake/Output this shift: Total I/O In: 43.9 [I.V.:43.9] Out: 30 [Chest Tube:30]  General appearance: alert and cooperative Neurologic: intact Heart: regular rate and rhythm, S1, S2 normal, no murmur, click, rub or gallop Lungs: decreased BS right base Wound: dressed,intact  Lab Results: Recent Labs    08/12/19 2113 08/13/19 0415  WBC 14.6* 13.5*  HGB 8.5* 7.6*  HCT 27.4* 25.4*  PLT 139* 152   BMET:  Recent Labs    08/12/19 2113 08/13/19 0415  NA 137 137  K 3.9 4.1  CL 110 109  CO2 18* 20*  GLUCOSE 124* 115*  BUN 9 11  CREATININE 0.73 0.78  CALCIUM 7.7* 7.9*    PT/INR:  Recent Labs    08/12/19 1520  LABPROT 17.2*  INR 1.4*   ABG    Component Value Date/Time   PHART 7.363 08/12/2019 2022   HCO3 18.2 (L) 08/12/2019 2022   TCO2 19 (L) 08/12/2019 2022   ACIDBASEDEF 6.0 (H) 08/12/2019 2022   O2SAT 99.0 08/12/2019 2022   CBG (last 3)  Recent Labs    08/13/19 0504  08/13/19 0606 08/13/19 0711  GLUCAP 114* 132* 112*    Assessment/Plan: S/P Procedure(s) (LRB): CORONARY ARTERY BYPASS GRAFTING (CABG)X 4 (N/A) TRANSESOPHAGEAL ECHOCARDIOGRAM (TEE) (N/A) Indocyanine Green Fluorescence Imaging (Icg) (N/A) Mobilize Diabetes control imdur for bilateral IMA  Amiodarone Beta-blocker   LOS: 1 day    Craig York 08/13/2019

## 2019-08-13 NOTE — Plan of Care (Signed)
  Problem: Education: Goal: Knowledge of the prescribed therapeutic regimen will improve Outcome: Progressing   Problem: Activity: Goal: Risk for activity intolerance will decrease Outcome: Progressing   Problem: Cardiac: Goal: Will achieve and/or maintain hemodynamic stability Outcome: Progressing   Problem: Clinical Measurements: Goal: Postoperative complications will be avoided or minimized Outcome: Progressing   Problem: Respiratory: Goal: Respiratory status will improve Outcome: Progressing

## 2019-08-14 ENCOUNTER — Inpatient Hospital Stay (HOSPITAL_COMMUNITY): Payer: Medicare HMO

## 2019-08-14 ENCOUNTER — Encounter (HOSPITAL_COMMUNITY): Payer: Self-pay | Admitting: Cardiothoracic Surgery

## 2019-08-14 LAB — BASIC METABOLIC PANEL
Anion gap: 9 (ref 5–15)
BUN: 10 mg/dL (ref 8–23)
CO2: 21 mmol/L — ABNORMAL LOW (ref 22–32)
Calcium: 8.1 mg/dL — ABNORMAL LOW (ref 8.9–10.3)
Chloride: 104 mmol/L (ref 98–111)
Creatinine, Ser: 0.9 mg/dL (ref 0.61–1.24)
GFR calc Af Amer: >60 mL/min (ref >60)
GFR calc non Af Amer: >60 mL/min (ref >60)
Glucose, Bld: 143 mg/dL — ABNORMAL HIGH (ref 70–99)
Potassium: 3.5 mmol/L (ref 3.5–5.1)
Sodium: 134 mmol/L — ABNORMAL LOW (ref 135–145)

## 2019-08-14 LAB — GLUCOSE, CAPILLARY
Glucose-Capillary: 141 mg/dL — ABNORMAL HIGH (ref 70–99)
Glucose-Capillary: 162 mg/dL — ABNORMAL HIGH (ref 70–99)
Glucose-Capillary: 156 mg/dL — ABNORMAL HIGH (ref 70–99)
Glucose-Capillary: 148 mg/dL — ABNORMAL HIGH (ref 70–99)
Glucose-Capillary: 135 mg/dL — ABNORMAL HIGH (ref 70–99)

## 2019-08-14 LAB — MAGNESIUM: Magnesium: 2.1 mg/dL (ref 1.7–2.4)

## 2019-08-14 MED ORDER — METOPROLOL TARTRATE 12.5 MG HALF TABLET
12.5000 mg | ORAL_TABLET | Freq: Three times a day (TID) | ORAL | Status: DC
  Administered 2019-08-14 – 2019-08-15 (×3): 12.5 mg via ORAL
  Filled 2019-08-14 (×3): qty 1

## 2019-08-14 MED ORDER — LISINOPRIL 2.5 MG PO TABS
2.5000 mg | ORAL_TABLET | Freq: Every day | ORAL | Status: DC
  Administered 2019-08-14: 2.5 mg via ORAL
  Filled 2019-08-14: qty 1

## 2019-08-14 MED ORDER — FENTANYL CITRATE (PF) 100 MCG/2ML IJ SOLN: 25.0000 ug | INTRAMUSCULAR | Status: DC | PRN

## 2019-08-14 MED ORDER — POTASSIUM CHLORIDE CRYS ER 20 MEQ PO TBCR
20.0000 meq | EXTENDED_RELEASE_TABLET | ORAL | Status: AC
  Administered 2019-08-14 (×3): 20 meq via ORAL
  Filled 2019-08-14 (×3): qty 1

## 2019-08-14 MED ORDER — FUROSEMIDE 10 MG/ML IJ SOLN
40.0000 mg | Freq: Once | INTRAMUSCULAR | Status: AC
  Administered 2019-08-14: 08:00:00 40 mg via INTRAVENOUS
  Filled 2019-08-14: qty 4

## 2019-08-14 MED ORDER — AMIODARONE HCL 200 MG PO TABS
400.0000 mg | ORAL_TABLET | Freq: Two times a day (BID) | ORAL | Status: DC
  Administered 2019-08-14 – 2019-08-17 (×7): 400 mg via ORAL
  Filled 2019-08-14 (×7): qty 2

## 2019-08-14 MED ORDER — METOPROLOL TARTRATE 25 MG/10 ML ORAL SUSPENSION: 12.5000 mg | Freq: Three times a day (TID) | ORAL | Status: DC

## 2019-08-14 MED FILL — Potassium Chloride Inj 2 mEq/ML: INTRAVENOUS | Qty: 40 | Status: AC

## 2019-08-14 MED FILL — Mannitol IV Soln 20%: INTRAVENOUS | Qty: 500 | Status: AC

## 2019-08-14 MED FILL — Sodium Chloride IV Soln 0.9%: INTRAVENOUS | Qty: 3000 | Status: AC

## 2019-08-14 MED FILL — Lidocaine HCl Local Soln Prefilled Syringe 100 MG/5ML (2%): INTRAMUSCULAR | Qty: 5 | Status: AC

## 2019-08-14 MED FILL — Heparin Sodium (Porcine) Inj 1000 Unit/ML: INTRAMUSCULAR | Qty: 20 | Status: AC

## 2019-08-14 MED FILL — Electrolyte-R (PH 7.4) Solution: INTRAVENOUS | Qty: 3000 | Status: AC

## 2019-08-14 MED FILL — Magnesium Sulfate Inj 50%: INTRAMUSCULAR | Qty: 10 | Status: AC

## 2019-08-14 MED FILL — Heparin Sodium (Porcine) Inj 1000 Unit/ML: INTRAMUSCULAR | Qty: 30 | Status: AC

## 2019-08-14 MED FILL — Sodium Bicarbonate IV Soln 8.4%: INTRAVENOUS | Qty: 50 | Status: AC

## 2019-08-14 NOTE — Progress Notes (Signed)
  2 Days Post-Op Procedure(s) (LRB): CORONARY ARTERY BYPASS GRAFTING (CABG)X 4 (N/A) TRANSESOPHAGEAL ECHOCARDIOGRAM (TEE) (N/A) Indocyanine Green Fluorescence Imaging (Icg) (N/A) Subjective:i Incisional pain  Objective: Vital signs in last 24 hours: Temp:  [98 F (36.7 C)-99.3 F (37.4 C)] 98.7 F (37.1 C) (09/17 0300) Pulse Rate:  [73-114] 111 (09/17 0630) Cardiac Rhythm: Normal sinus rhythm (09/17 0315) Resp:  [20-41] 28 (09/17 0700) BP: (122-147)/(68-87) 136/74 (09/17 0700) SpO2:  [92 %-97 %] 94 % (09/17 0630) Arterial Line BP: (145-224)/(55-74) 148/58 (09/17 0700)  Hemodynamic parameters for last 24 hours: PAP: (35-42)/(15-19) 42/15  Intake/Output from previous day: 09/16 0701 - 09/17 0700 In: 2446.6 [P.O.:840; I.V.:1591.1; IV Piggyback:15.4] Out: 1620 [Urine:1110; Chest Tube:510] Intake/Output this shift: No intake/output data recorded.  General appearance: alert and cooperative Neurologic: intact Heart: regular rate and rhythm, S1, S2 normal, no murmur, click, rub or gallop Abdomen: soft, non-tender; bowel sounds normal; no masses,  no organomegaly Extremities: edema 1+ Wound: dressed, dry  Lab Results: Recent Labs    08/13/19 0415 08/13/19 1612  WBC 13.5* 20.2*  HGB 7.6* 7.8*  HCT 25.4* 26.1*  PLT 152 163   BMET:  Recent Labs    08/13/19 1612 08/14/19 0436  NA 134* 134*  K 3.9 3.5  CL 105 104  CO2 19* 21*  GLUCOSE 192* 143*  BUN 12 10  CREATININE 0.90 0.90  CALCIUM 8.2* 8.1*    PT/INR:  Recent Labs    08/12/19 1520  LABPROT 17.2*  INR 1.4*   ABG    Component Value Date/Time   PHART 7.363 08/12/2019 2022   HCO3 18.2 (L) 08/12/2019 2022   TCO2 19 (L) 08/12/2019 2022   ACIDBASEDEF 6.0 (H) 08/12/2019 2022   O2SAT 99.0 08/12/2019 2022   CBG (last 3)  Recent Labs    08/13/19 2011 08/13/19 2345 08/14/19 0437  GLUCAP 161* 158* 141*    Assessment/Plan: S/P Procedure(s) (LRB): CORONARY ARTERY BYPASS GRAFTING (CABG)X 4  (N/A) TRANSESOPHAGEAL ECHOCARDIOGRAM (TEE) (N/A) Indocyanine Green Fluorescence Imaging (Icg) (N/A) Mobilize Diuresis Diabetes control d/c tubes/lines  Ok to transfer to telemetry   LOS: 2 days    Craig York 08/14/2019

## 2019-08-14 NOTE — Progress Notes (Signed)
RN observed patient's systolic blood increase to the 170s to 200s. RN gave PRN hydralazine. See MAR. RN also asked patient if he is in pain. Patient said he was only in a little pain. RN then asked for pain level on a scale of 1 to 10. Patient stated his pain level was a 10. RN proceeded to educate the patient on the importance of pain control and how lack of pain control increases his blood pressure. RN told patient to notify RN of pain once pain is felt in order to adequately control pain. Patient stated he understood. RN gave patient PRN morphine. See MAR. Blood pressures decreased. See vital signs.

## 2019-08-14 NOTE — Discharge Summary (Signed)
Physician Discharge Summary        [address]       [place] [address]             [phone]         Patient ID: Craig York MRN: [id number] DOB/AGE: 05-15-1951 68 y.o.  Admit date: 08/12/2019 Discharge date: 08/17/2019  Admission Diagnoses: severe CAD  Discharge Diagnoses:  Active Problems:   S/P CABG x 4   Patient Active Problem List   Diagnosis Date Noted  . S/P CABG x 4 08/12/2019  . History of adenomatous polyp of colon 07/05/2019  . Gastric polyp   . Iron (Fe) deficiency anemia 04/19/2017  . Fracture of metatarsal bone, right sequela 06/04/2015  . H/O acute myocardial infarction 06/04/2015  . Lumbar radiculopathy 06/04/2015  . Sciatica 02/22/2009  . Diabetes mellitus, type 2 ([place]) 01/07/2009  . Lumbosacral spondylosis 12/06/2008  . ED (erectile dysfunction) of organic origin 10/18/2008  . Coronary artery disease 10/16/2008  . HLD (hyperlipidemia) 07/23/2008  . Essential (primary) hypertension 07/23/2008   HPI:   Patient is a 68 year old gentleman who was referred to Dr. Orvan Seen for consideration of coronary artery bypass grafting.  He was in his usual state of health until the past several weeks when he has developed episodes of left-sided chest pain which were noted to be worse with exertion.  This was also accompanied with shortness of breath.  He presented to his local physician and a stress test was done but had to be stopped secondary to symptoms.  Subsequent to this he underwent a left heart catheterization and was found to have severe multivessel coronary artery disease with a complicated LAD anatomy.  The patient has had previous LAD stenting.  There was in stent restenosis.  The patient did deny any rest pain or rest dyspnea.  Due to the severe anatomical findings he was recommended to undergo coronary artery bypass grafting and was admitted this hospitalization for the procedure.  Discharged Condition: good  Hospital Course:   On 08/12/2019 [name] underwent a coronary bypass grafting x4.  He tolerated the procedure well was transferred to the surgical ICU for continued care.  He was extubated time manner.  On postop day 1 he was feeling well.  Began to mobilize patient.  We worked on his blood glucose control.  We initiated Imdur for bilateral IMA.  We continued amiodarone and a beta-blocker for rate and rhythm control.  Postop day 2 his only complaint was some incisional pain.  We discontinued his chest tubes and lines.  We began to diurese the patient.  He was stable for transfer to the telemetry unit.  Postop day 3 we discontinued his pacing wires.  He remained in the unit due to bed issues on 4 E.  He remained stable.  Postop day 4 his epicardial pacing wires were removed.  His lisinopril was increased because his blood pressure was rising.  His oxygen saturation remained well controlled.  He was hypokalemic therefore his potassium was replaced.  Overall, he was progressing well.  Today, he is tolerating room air, ambulating with limited assistance, his incisions are healing well, he is ready for discharge home.  We will obtain a final be met to evaluate his potassium level before discharge.  He will be discharged on 1 month of M door for bilateral mammary harvest.  We will decrease his amiodarone to 200 twice daily.  Consults: None  Significant Diagnostic Studies: routine post-op labs and serial  CXR's  Treatments:  CARDIOTHORACIC SURGERY OPERATIVE NOTE  Date of Procedure:    08/12/2019  Preoperative Diagnosis:      Severe 3-vessel Coronary Artery Disease, s/p NSTEMI  Postoperative Diagnosis:    Same  Procedure:        Coronary Artery Bypass Grafting x 4              Left Internal Mammary Artery to Distal Left Anterior Descending Coronary Artery;  Reverse Saphenous Vein Graft to Posterior Descending Coronary Artery; Free right Internal mammary artery Graft to  Obtuse Marginal Branch of Left Circumflex  Coronary Artery; Reverse Sapheonous Vein Graft to  Diagonal Branch Coronary Artery; Endoscopic Vein Harvest from right Thigh; bilateral IMA harvesting; epiaortic ultrasonography; completion indocyanine green flourescence imaging (SPY)  Surgeon:        B. Murvin Natal, MD  Assistant:       Jadene Pierini PA-C  Anesthesia:    get  Operative Findings: ? Preserved  left ventricular systolic function ? good quality  internal mammary artery conduits ? good quality saphenous vein conduit ? good quality target vessels for grafting   Discharge Exam: Blood pressure (!) 143/76, pulse 78, temperature 98.6 F (37 C), temperature source Oral, resp. rate 18, height _0  (1.854 m), weight 88.4 kg, SpO2 99 %.  General appearance: alert, cooperative and no distress Heart: regular rate and rhythm, S1, S2 normal, no murmur, click, rub or gallop Lungs: clear to auscultation bilaterally Abdomen: soft, non-tender; bowel sounds normal; no masses,  no organomegaly Extremities: extremities normal, atraumatic, no cyanosis or edema Wound: clean and dry  Disposition: Discharge disposition: 01-Home or Self Care        Allergies as of 08/17/2019      Reactions   Simvastatin    Elevated CK   Penicillins Rash   Did it involve swelling of the face/tongue/throat, SOB, or low BP? No Did it involve sudden or severe rash/hives, skin peeling, or any reaction on the inside of your mouth or nose? No Did you need to seek medical attention at a hospital or doctor's office? No When did it last happen? If all above answers are "NO", may proceed with cephalosporin use.      Medication List    STOP taking these medications   amLODipine 10 MG tablet Commonly known as: NORVASC   empagliflozin 25 MG Tabs tablet Commonly known as: Jardiance   glipiZIDE 10 MG tablet Commonly known as: GLUCOTROL   ibuprofen 800 MG tablet Commonly known as: ADVIL   metoprolol succinate 25 MG 24 hr tablet Commonly known  as: TOPROL-XL   oxyCODONE-acetaminophen 10-325 MG tablet Commonly known as: PERCOCET   sildenafil 100 MG tablet Commonly known as: Viagra   silver sulfADIAZINE 1 % cream Commonly known as: SILVADENE     TAKE these medications   acetaminophen 500 MG tablet Commonly known as: TYLENOL Take 1,000 mg by mouth every 6 (six) hours as needed for moderate pain.   amiodarone 400 MG tablet Commonly known as: PACERONE Take 0.5 tablets (200 mg total) by mouth 2 (two) times daily.   aspirin 81 MG tablet Take 81 mg by mouth daily.   clopidogrel 75 MG tablet Commonly known as: PLAVIX Take 1 tablet (75 mg total) by mouth daily. Start taking on: August 18, 2019   isosorbide dinitrate 10 MG tablet Commonly known as: ISORDIL Take 1 tablet (10 mg total) by mouth 3 (three) times daily.   lisinopril 10 MG tablet Commonly known as: ZESTRIL Take  1 tablet (10 mg total) by mouth daily. Start taking on: August 18, 2019   metFORMIN 500 MG tablet Commonly known as: GLUCOPHAGE TAKE TWO TABLETS BY MOUTH TWICE DAILY WITH A MEAL What changed:   how much to take  how to take this  when to take this  additional instructions   metoprolol tartrate 25 MG tablet Commonly known as: LOPRESSOR Take 1 tablet (25 mg total) by mouth every 8 (eight) hours.   oxyCODONE 5 MG immediate release tablet Commonly known as: Oxy IR/ROXICODONE Take 1 tablet (5 mg total) by mouth every 6 (six) hours as needed for severe pain.   rosuvastatin 20 MG tablet Commonly known as: CRESTOR Take 1 tablet (20 mg total) by mouth daily at 6 PM. What changed:   medication strength  how much to take  when to take this      Follow-up Information    Callwood, Loran Senters, MD Follow up.   Specialties: Cardiology, Internal Medicine Why: Appointment to see cardiologist on Monday, October 5 at 11 AM Contact information: [place] [address] [phone]        [name], MD. Call  in 1 day(s).   Specialty: Family Medicine Contact information: [address] [place] [address] [phone]        [name], MD Follow up.   Specialty: Cardiothoracic Surgery Why: Your routine follow-up appointment is on 08/22/2019 at 10:00am. Please arrive at 9:30am for a chest xray located at [facility] which is on the first floor of our building.  Contact information: [address] [place] [address] [phone]                   The patient has been discharged on:   1.Beta Blocker:  Yes [ yes  ]                              No   [   ]                              If No, reason:  2.Ace Inhibitor/ARB: Yes [ yes  ]                                     No  [    ]                                     If No, reason:  3.Statin:   Yes [ yes  ]                  No  [   ]                  If No, reason:  4.Ecasa:  Yes  [ yes  ]                  No   [   ]                  If No, reason:      Signed: Elgie Collard 08/17/2019, 11:03 AM

## 2019-08-14 NOTE — Progress Notes (Signed)
Patient ID: Craig York, male   DOB: [birth year], 68 y.o.   MRN: [id number] TCTS Evening Rounds:  Hemodynamically stable in sinus rhythm.  sats 94%  Urine output ok  Awaiting bed on 4E.

## 2019-08-15 LAB — BASIC METABOLIC PANEL
Anion gap: 11 (ref 5–15)
BUN: 14 mg/dL (ref 8–23)
CO2: 21 mmol/L — ABNORMAL LOW (ref 22–32)
Calcium: 8.2 mg/dL — ABNORMAL LOW (ref 8.9–10.3)
Chloride: 104 mmol/L (ref 98–111)
Creatinine, Ser: 0.85 mg/dL (ref 0.61–1.24)
GFR calc Af Amer: >60 mL/min (ref >60)
GFR calc non Af Amer: >60 mL/min (ref >60)
Glucose, Bld: 126 mg/dL — ABNORMAL HIGH (ref 70–99)
Potassium: 3.7 mmol/L (ref 3.5–5.1)
Sodium: 136 mmol/L (ref 135–145)

## 2019-08-15 LAB — GLUCOSE, CAPILLARY
Glucose-Capillary: 109 mg/dL — ABNORMAL HIGH (ref 70–99)
Glucose-Capillary: 119 mg/dL — ABNORMAL HIGH (ref 70–99)
Glucose-Capillary: 125 mg/dL — ABNORMAL HIGH (ref 70–99)
Glucose-Capillary: 132 mg/dL — ABNORMAL HIGH (ref 70–99)
Glucose-Capillary: 137 mg/dL — ABNORMAL HIGH (ref 70–99)
Glucose-Capillary: 138 mg/dL — ABNORMAL HIGH (ref 70–99)

## 2019-08-15 MED ORDER — ROSUVASTATIN CALCIUM 20 MG PO TABS
20.0000 mg | ORAL_TABLET | Freq: Every day | ORAL | Status: DC
  Administered 2019-08-15 – 2019-08-16 (×2): 20 mg via ORAL
  Filled 2019-08-15 (×2): qty 1

## 2019-08-15 MED ORDER — METOPROLOL TARTRATE 25 MG PO TABS
25.0000 mg | ORAL_TABLET | Freq: Three times a day (TID) | ORAL | Status: DC
  Administered 2019-08-15 – 2019-08-17 (×6): 25 mg via ORAL
  Filled 2019-08-15 (×6): qty 1

## 2019-08-15 MED ORDER — LISINOPRIL 5 MG PO TABS
5.0000 mg | ORAL_TABLET | Freq: Every day | ORAL | Status: DC
  Administered 2019-08-15: 10:00:00 5 mg via ORAL
  Filled 2019-08-15: qty 1

## 2019-08-15 MED ORDER — COLCHICINE 0.6 MG PO TABS
0.6000 mg | ORAL_TABLET | Freq: Every day | ORAL | Status: DC
  Administered 2019-08-15 – 2019-08-17 (×3): 0.6 mg via ORAL
  Filled 2019-08-15 (×3): qty 1

## 2019-08-15 MED ORDER — FUROSEMIDE 40 MG PO TABS
40.0000 mg | ORAL_TABLET | Freq: Every day | ORAL | Status: DC
  Administered 2019-08-15 – 2019-08-17 (×3): 40 mg via ORAL
  Filled 2019-08-15 (×3): qty 1

## 2019-08-15 MED ORDER — POTASSIUM CHLORIDE CRYS ER 20 MEQ PO TBCR
20.0000 meq | EXTENDED_RELEASE_TABLET | Freq: Two times a day (BID) | ORAL | Status: DC
  Administered 2019-08-15: 23:00:00 20 meq via ORAL
  Filled 2019-08-15 (×2): qty 1

## 2019-08-15 MED ORDER — METOPROLOL TARTRATE 25 MG/10 ML ORAL SUSPENSION
25.0000 mg | Freq: Three times a day (TID) | ORAL | Status: DC
  Filled 2019-08-15 (×7): qty 10

## 2019-08-15 NOTE — Plan of Care (Signed)
  Problem: Education: Goal: Knowledge of General Education information will improve Description Including pain rating scale, medication(s)/side effects and non-pharmacologic comfort measures Outcome: Progressing   

## 2019-08-15 NOTE — Progress Notes (Signed)
Called back to try and give report to 4E RN.  Per Aeronautical engineer is busy transferring a patient to [facility].

## 2019-08-15 NOTE — Progress Notes (Signed)
Patient received to unit via wheelchair and RN on tele monitor.  Patient assisted to bed and placed on monitor and admitted per central tele.  No s/s of distress noted.  Denies pain.  Pacing wires noted to lower chest taped into place charge RN made aware.  TVP box at bedside.  Midline sternal incision clean dry and intact with skin glue.  Patient A/O x4.  Water given per request.

## 2019-08-15 NOTE — Progress Notes (Signed)
3 Days Post-Op Procedure(s) (LRB): CORONARY ARTERY BYPASS GRAFTING (CABG)X 4 (N/A) TRANSESOPHAGEAL ECHOCARDIOGRAM (TEE) (N/A) Indocyanine Green Fluorescence Imaging (Icg) (N/A) Subjective: Feeling much better Objective: Vital signs in last 24 hours: Temp:  [98.2 F (36.8 C)-99.3 F (37.4 C)] 99.3 F (37.4 C) (09/18 0300) Pulse Rate:  [85-104] 101 (09/18 0700) Cardiac Rhythm: Normal sinus rhythm (09/18 0400) Resp:  [13-35] 31 (09/18 0700) BP: (123-155)/(65-96) 155/80 (09/18 0636) SpO2:  [93 %-100 %] 98 % (09/18 0700) Arterial Line BP: (162)/(58) 162/58 (09/17 0900) Weight:  [91.2 kg] 91.2 kg (09/18 0500)  Hemodynamic parameters for last 24 hours:    Intake/Output from previous day: 09/17 0701 - 09/18 0700 In: 1102 [P.O.:970; I.V.:132] Out: 3070 [Urine:2780; Chest Tube:290] Intake/Output this shift: No intake/output data recorded.  General appearance: alert and cooperative Neurologic: intact Heart: regular rate and rhythm, S1, S2 normal, no murmur, click, rub or gallop Lungs: clear to auscultation bilaterally Wound: dressed, dry  Lab Results: Recent Labs    08/13/19 0415 08/13/19 1612  WBC 13.5* 20.2*  HGB 7.6* 7.8*  HCT 25.4* 26.1*  PLT 152 163   BMET:  Recent Labs    08/13/19 1612 08/14/19 0436  NA 134* 134*  K 3.9 3.5  CL 105 104  CO2 19* 21*  GLUCOSE 192* 143*  BUN 12 10  CREATININE 0.90 0.90  CALCIUM 8.2* 8.1*    PT/INR:  Recent Labs    08/12/19 1520  LABPROT 17.2*  INR 1.4*   ABG    Component Value Date/Time   PHART 7.363 08/12/2019 2022   HCO3 18.2 (L) 08/12/2019 2022   TCO2 19 (L) 08/12/2019 2022   ACIDBASEDEF 6.0 (H) 08/12/2019 2022   O2SAT 99.0 08/12/2019 2022   CBG (last 3)  Recent Labs    08/14/19 2015 08/15/19 0030 08/15/19 0407  GLUCAP 135* 119* 137*    Assessment/Plan: S/P Procedure(s) (LRB): CORONARY ARTERY BYPASS GRAFTING (CABG)X 4 (N/A) TRANSESOPHAGEAL ECHOCARDIOGRAM (TEE) (N/A) Indocyanine Green Fluorescence  Imaging (Icg) (N/A) Mobilize Diuresis d/c pacing wires d/c tubes/lines Plan for transfer to step-down: see transfer orders   LOS: 3 days    [name] 08/15/2019

## 2019-08-15 NOTE — Progress Notes (Signed)
At bedside report was informed by off going RN that foley had been removed prior to admission to unit 4E09. Foley had not been documented as removed in the chart. I documented foley D/C'd prior to admission.

## 2019-08-15 NOTE — Progress Notes (Signed)
Report given to Jackelyn Poling, RN on 4E.  All questions answered.  To transport patient to 4E09.

## 2019-08-15 NOTE — Anesthesia Postprocedure Evaluation (Signed)
Anesthesia Post Note  Patient: JAVONTA WIGMORE  Procedure(s) Performed: CORONARY ARTERY BYPASS GRAFTING (CABG)X 4 (N/A Chest) TRANSESOPHAGEAL ECHOCARDIOGRAM (TEE) (N/A Mouth) Indocyanine Green Fluorescence Imaging (Icg) (N/A Chest)     Patient location during evaluation: SICU Anesthesia Type: General Level of consciousness: sedated Pain management: pain level controlled Vital Signs Assessment: post-procedure vital signs reviewed and stable Respiratory status: patient remains intubated per anesthesia plan Cardiovascular status: stable Postop Assessment: no apparent nausea or vomiting Anesthetic complications: no    Last Vitals:  Vitals:   08/15/19 0636 08/15/19 0700  BP: (!) 155/80   Pulse: 99 (!) 101  Resp:  (!) 31  Temp:    SpO2:  98%    Last Pain:  Vitals:   08/15/19 0300  TempSrc: Oral  PainSc:                  Norwood Quezada

## 2019-08-15 NOTE — Progress Notes (Signed)
Called 4E to give report. Nurse currently busy and to call back.

## 2019-08-15 NOTE — Progress Notes (Signed)
CARDIAC REHAB PHASE I   Offered to walk with pt several times today. Pt declines walking stating he "walked already". Pt educated on importance of walking multiple times a day and sitting in chair. Pt continues to decline. Encouraged IS use and a walk later. Will continue to follow.  [id number] Rufina Falco, RN BSN 08/15/2019 1:58 PM

## 2019-08-16 LAB — BASIC METABOLIC PANEL
Anion gap: 12 (ref 5–15)
BUN: 13 mg/dL (ref 8–23)
CO2: 23 mmol/L (ref 22–32)
Calcium: 7.9 mg/dL — ABNORMAL LOW (ref 8.9–10.3)
Chloride: 102 mmol/L (ref 98–111)
Creatinine, Ser: 0.89 mg/dL (ref 0.61–1.24)
GFR calc Af Amer: >60 mL/min (ref >60)
GFR calc non Af Amer: >60 mL/min (ref >60)
Glucose, Bld: 108 mg/dL — ABNORMAL HIGH (ref 70–99)
Potassium: 3.1 mmol/L — ABNORMAL LOW (ref 3.5–5.1)
Sodium: 137 mmol/L (ref 135–145)

## 2019-08-16 LAB — GLUCOSE, CAPILLARY
Glucose-Capillary: 115 mg/dL — ABNORMAL HIGH (ref 70–99)
Glucose-Capillary: 196 mg/dL — ABNORMAL HIGH (ref 70–99)
Glucose-Capillary: 116 mg/dL — ABNORMAL HIGH (ref 70–99)
Glucose-Capillary: 145 mg/dL — ABNORMAL HIGH (ref 70–99)
Glucose-Capillary: 141 mg/dL — ABNORMAL HIGH (ref 70–99)

## 2019-08-16 MED ORDER — POTASSIUM CHLORIDE CRYS ER 20 MEQ PO TBCR
40.0000 meq | EXTENDED_RELEASE_TABLET | Freq: Two times a day (BID) | ORAL | Status: AC
  Administered 2019-08-16 (×2): 40 meq via ORAL
  Filled 2019-08-16 (×2): qty 2

## 2019-08-16 MED ORDER — LISINOPRIL 10 MG PO TABS
10.0000 mg | ORAL_TABLET | Freq: Every day | ORAL | Status: DC
  Administered 2019-08-16 – 2019-08-17 (×2): 10 mg via ORAL
  Filled 2019-08-16 (×2): qty 1

## 2019-08-16 MED ORDER — POTASSIUM CHLORIDE CRYS ER 20 MEQ PO TBCR
20.0000 meq | EXTENDED_RELEASE_TABLET | Freq: Every day | ORAL | Status: DC
  Administered 2019-08-17: 20 meq via ORAL
  Filled 2019-08-16: qty 1

## 2019-08-16 MED ORDER — INSULIN ASPART 100 UNIT/ML ~~LOC~~ SOLN: 0.0000 [IU] | Freq: Three times a day (TID) | SUBCUTANEOUS | Status: DC

## 2019-08-16 NOTE — Progress Notes (Signed)
Patient education completed for removal of pacing wires , patient verbalized understanding, pacing wires removed as ordered, patient now on bedrest, procedure well tolerated will continue to monitor.

## 2019-08-16 NOTE — Progress Notes (Signed)
      [address]       [place] [address]             [phone]      4 Days Post-Op Procedure(s) (LRB): CORONARY ARTERY BYPASS GRAFTING (CABG)X 4 (N/A) TRANSESOPHAGEAL ECHOCARDIOGRAM (TEE) (N/A) Indocyanine Green Fluorescence Imaging (Icg) (N/A) Subjective: Feels good this morning. Unhappy with being "poked at" earlier this morning. Didn't sleep well.   Objective: Vital signs in last 24 hours: Temp:  [98.3 F (36.8 C)-99 F (37.2 C)] 99 F (37.2 C) (09/19 0741) Pulse Rate:  [76-110] 76 (09/19 0741) Cardiac Rhythm: Sinus tachycardia (09/18 2020) Resp:  [15-37] 22 (09/19 0741) BP: (129-168)/(74-89) 135/82 (09/19 0741) SpO2:  [94 %-98 %] 97 % (09/19 0741) Weight:  [91 kg] 91 kg (09/19 0406)     Intake/Output from previous day: 09/18 0701 - 09/19 0700 In: -  Out: 450 [Urine:450] Intake/Output this shift: No intake/output data recorded.  General appearance: alert, cooperative and no distress Heart: regular rate and rhythm, S1, S2 normal, no murmur, click, rub or gallop Lungs: clear to auscultation bilaterally Abdomen: soft, non-tender; bowel sounds normal; no masses,  no organomegaly Extremities: extremities normal, atraumatic, no cyanosis or edema Wound: clean and dry  Lab Results: Recent Labs    08/13/19 1612  WBC 20.2*  HGB 7.8*  HCT 26.1*  PLT 163   BMET:  Recent Labs    08/15/19 1101 08/16/19 0325  NA 136 137  K 3.7 3.1*  CL 104 102  CO2 21* 23  GLUCOSE 126* 108*  BUN 14 13  CREATININE 0.85 0.89  CALCIUM 8.2* 7.9*    PT/INR: No results for input(s): LABPROT, INR in the last 72 hours. ABG    Component Value Date/Time   PHART 7.363 08/12/2019 2022   HCO3 18.2 (L) 08/12/2019 2022   TCO2 19 (L) 08/12/2019 2022   ACIDBASEDEF 6.0 (H) 08/12/2019 2022   O2SAT 99.0 08/12/2019 2022   CBG (last 3)  Recent Labs    08/15/19 1623 08/15/19 2123 08/16/19 0410  GLUCAP 132* 125* 115*    Assessment/Plan: S/P Procedure(s) (LRB):  CORONARY ARTERY BYPASS GRAFTING (CABG)X 4 (N/A) TRANSESOPHAGEAL ECHOCARDIOGRAM (TEE) (N/A) Indocyanine Green Fluorescence Imaging (Icg) (N/A)  1. CV-NSR in the 70s, BP high at times. Will increase lisinopril to 10mg . Still has PRN hydralazine as well. Continue metoprolol at 25mg  BID and Isordil 10mg  TID.  2. Pulm-Tolerating room air with good oxygen saturation.  3. Renal-creatinine 0.89, will replace potassium 3.1 this morning.  4. Blod glucose well controlled 5. H and H stable 7.8/26.1, expected acute blood loss anemia  Plan: Remove EPW since rhythm has been stable. Replace potassium. Ambulate around the unit. Encouraged incentive spirometer. Maybe home tomorrow if he continued to progress.    LOS: 4 days    Elgie Collard 08/16/2019

## 2019-08-16 NOTE — Progress Notes (Addendum)
[id number] Cardiac Rehab  Pt declines to walk now states that that he wants to get his bath first. Completed discharge education with pt and family. We discussed sternal precautions, exercise guidelines, risk factors, modifications, heart healthy diabetic diet and Outpt. CRP. He voices understanding. Will send a letter of interest to [place]. CRP in [place]. I instructed pt in how to view the post-op [facility] video on TV.

## 2019-08-17 LAB — BPAM RBC
Blood Product Expiration Date: 202009262359
Blood Product Expiration Date: 202009302359
Blood Product Expiration Date: 202010022359
Blood Product Expiration Date: 202010092359
ISSUE DATE / TIME: 202009082009
ISSUE DATE / TIME: 202009100928
ISSUE DATE / TIME: 202009150825
ISSUE DATE / TIME: 202009150825
Unit Type and Rh: 6200
Unit Type and Rh: 6200
Unit Type and Rh: 6200
Unit Type and Rh: 6200

## 2019-08-17 LAB — BASIC METABOLIC PANEL
Anion gap: 8 (ref 5–15)
BUN: 10 mg/dL (ref 8–23)
CO2: 24 mmol/L (ref 22–32)
Calcium: 8.2 mg/dL — ABNORMAL LOW (ref 8.9–10.3)
Chloride: 106 mmol/L (ref 98–111)
Creatinine, Ser: 1.05 mg/dL (ref 0.61–1.24)
GFR calc Af Amer: >60 mL/min (ref >60)
GFR calc non Af Amer: >60 mL/min (ref >60)
Glucose, Bld: 212 mg/dL — ABNORMAL HIGH (ref 70–99)
Potassium: 3 mmol/L — ABNORMAL LOW (ref 3.5–5.1)
Sodium: 138 mmol/L (ref 135–145)

## 2019-08-17 LAB — TYPE AND SCREEN
ABO/RH(D): A POS
Antibody Screen: NEGATIVE
Unit division: 0
Unit division: 0
Unit division: 0
Unit division: 0

## 2019-08-17 LAB — GLUCOSE, CAPILLARY
Glucose-Capillary: 119 mg/dL — ABNORMAL HIGH (ref 70–99)
Glucose-Capillary: 119 mg/dL — ABNORMAL HIGH (ref 70–99)

## 2019-08-17 MED ORDER — POTASSIUM CHLORIDE CRYS ER 20 MEQ PO TBCR
40.0000 meq | EXTENDED_RELEASE_TABLET | Freq: Two times a day (BID) | ORAL | Status: DC
  Administered 2019-08-17: 12:00:00 40 meq via ORAL
  Filled 2019-08-17: qty 2

## 2019-08-17 MED ORDER — CLOPIDOGREL BISULFATE 75 MG PO TABS: 75.0000 mg | ORAL_TABLET | Freq: Every day | ORAL | 1 refills | Status: DC

## 2019-08-17 MED ORDER — ROSUVASTATIN CALCIUM 20 MG PO TABS: 20.0000 mg | ORAL_TABLET | Freq: Every day | ORAL | 1 refills | Status: DC

## 2019-08-17 MED ORDER — AMIODARONE HCL 400 MG PO TABS: 200.0000 mg | ORAL_TABLET | Freq: Two times a day (BID) | ORAL | 1 refills | Status: DC

## 2019-08-17 MED ORDER — METOPROLOL TARTRATE 25 MG PO TABS: 25.0000 mg | ORAL_TABLET | Freq: Three times a day (TID) | ORAL | 1 refills | Status: DC

## 2019-08-17 MED ORDER — LISINOPRIL 10 MG PO TABS: 10.0000 mg | ORAL_TABLET | Freq: Every day | ORAL | 1 refills | Status: DC

## 2019-08-17 MED ORDER — OXYCODONE HCL 5 MG PO TABS: 5.0000 mg | ORAL_TABLET | Freq: Four times a day (QID) | ORAL | 0 refills | Status: DC | PRN

## 2019-08-17 MED ORDER — ISOSORBIDE DINITRATE 10 MG PO TABS: 10.0000 mg | ORAL_TABLET | Freq: Three times a day (TID) | ORAL | 1 refills | Status: DC

## 2019-08-17 NOTE — Plan of Care (Signed)
Continue to monitor

## 2019-08-17 NOTE — Discharge Instructions (Signed)
Coronary Artery Bypass Grafting, Care After This sheet gives you information about how to care for yourself after your procedure. Your doctor may also give you more specific instructions. If you have problems or questions, call your doctor. What can I expect after the procedure? After the procedure, it is common to:  Feel sick to your stomach (nauseous).  Not want to eat as much as normal (lack of appetite).  Have trouble pooping (constipation).  Have weakness and tiredness (fatigue).  Feel sad (depressed) or grouchy (irritable).  Have pain or discomfort around the cuts from surgery (incisions). Follow these instructions at home: Medicines  Take over-the-counter and prescription medicines only as told by your doctor. Do not stop taking medicines or start any new medicines unless your doctor says it is okay.  If you were prescribed an antibiotic medicine, take it as told by your doctor. Do not stop taking the antibiotic even if you start to feel better. Incision care   Follow instructions from your doctor about how to take care of your cuts from surgery. Make sure you: ? Wash your hands with soap and water before and after you change your bandage (dressing). If you cannot use soap and water, use hand sanitizer. ? Change your bandage as told by your doctor. ? Leave stitches (sutures), skin glue, or skin tape (adhesive) strips in place. They may need to stay in place for 2 weeks or longer. If tape strips get loose and curl up, you may trim the loose edges. Do not remove tape strips completely unless your doctor says it is okay.  Make sure the surgery cuts are clean, dry, and protected.  Check your cut areas every day for signs of infection. Check for: ? More redness, swelling, or pain. ? More fluid or blood. ? Warmth. ? Pus or a bad smell.  If cuts were made in your legs: ? Avoid crossing your legs. ? Avoid sitting for long periods of time. Change positions every 30  minutes. ? Raise (elevate) your legs when you are sitting. Bathing  Do not take baths, swim, or use a hot tub until your doctor says it is okay.  Only take sponge baths. Pat the surgery cuts dry. Do not rub the cuts to dry.  Ask your doctor when you can shower. Eating and drinking   Eat foods that are high in fiber, such as beans, nuts, whole grains, and raw fruits and vegetables. Any meats you eat should be lean cut. Avoid canned, processed, and fried foods. This can help prevent trouble pooping. This is also a part of a heart-healthy diet.  Drink enough fluid to keep your pee (urine) pale yellow.  Do not drink alcohol until you are fully recovered. Ask your doctor when it is safe to drink alcohol. Activity  Rest and limit your activity as told by your doctor. You may be told to: ? Stop any activity right away if you have chest pain, shortness of breath, irregular heartbeats, or dizziness. Get help right away if you have any of these symptoms. ? Move around often for short periods or take short walks as told by your doctor. Slowly increase your activities. ? Avoid lifting, pushing, or pulling anything that is heavier than 10 lb (4.5 kg) for at least 6 weeks or as told by your doctor.  Do physical therapy or a cardiac rehab (cardiac rehabilitation) program as told by your doctor. ? Physical therapy involves doing exercises to maintain movement and build strength and endurance. ?  A cardiac rehab program includes:  Exercise training.  Education.  Counseling.  Do not drive until your doctor says it is okay.  Ask your doctor when you can go back to work.  Ask your doctor when you can be sexually active. General instructions  Do not drive or use heavy machinery while taking prescription pain medicine.  Do not use any products that contain nicotine or tobacco. These include cigarettes, e-cigarettes, and chewing tobacco. If you need help quitting, ask your doctor.  Take 2-3 deep  breaths every few hours during the day while you get better. This helps expand your lungs and prevent problems.  If you were given a device called an incentive spirometer, use it several times a day to practice deep breathing. Support your chest with a pillow or your arms when you take deep breaths or cough.  Wear compression stockings as told by your doctor.  Weigh yourself every day. This helps to see if your body is holding (retaining) fluid that may make your heart and lungs work harder.  Keep all follow-up visits as told by your doctor. This is important. Contact a doctor if:  You have more redness, swelling, or pain around any cut.  You have more fluid or blood coming from any cut.  Any cut feels warm to the touch.  You have pus or a bad smell coming from any cut.  You have a fever.  You have swelling in your ankles or legs.  You have pain in your legs.  You gain 2 lb (0.9 kg) or more a day.  You feel sick to your stomach or you throw up (vomit).  You have watery poop (diarrhea). Get help right away if:  You have chest pain that goes to your jaw or arms.  You are short of breath.  You have a fast or irregular heartbeat.  You notice a "clicking" in your breastbone (sternum) when you move.  You have any signs of a stroke. "BE FAST" is an easy way to remember the main warning signs: ? B - Balance. Signs are dizziness, sudden trouble walking, or loss of balance. ? E - Eyes. Signs are trouble seeing or a change in how you see. ? F - Face. Signs are sudden weakness or loss of feeling of the face, or the face or eyelid drooping on one side. ? A - Arms. Signs are weakness or loss of feeling in an arm. This happens suddenly and usually on one side of the body. ? S - Speech. Signs are sudden trouble speaking, slurred speech, or trouble understanding what people say. ? T - Time. Time to call emergency services. Write down what time symptoms started.  You have other signs of  a stroke, such as: ? A sudden, very bad headache with no known cause. ? Feeling sick to your stomach. ? Throwing up. ? Jerky movements you cannot control (seizure). These symptoms may be an emergency. Do not wait to see if the symptoms will go away. Get medical help right away. Call your local emergency services (911 in the [place] Do not drive yourself to the hospital. Summary  After the procedure, it is common to have pain or discomfort in the cuts from surgery (incisions).  Do not take baths, swim, or use a hot tub until your doctor says it is okay.  Slowly increase your activities. You may need physical therapy or cardiac rehab.  Weigh yourself every day. This helps to see if your body is  holding fluid. This information is not intended to replace advice given to you by your health care provider. Make sure you discuss any questions you have with your health care provider. Document Released: 11/18/2013 Document Revised: 07/23/2018 Document Reviewed: 07/23/2018 Elsevier Patient Education  2020 [place].  Endoscopic Saphenous Vein Harvesting, Care After This sheet gives you information about how to care for yourself after your procedure. Your health care provider may also give you more specific instructions. If you have problems or questions, contact your health care provider. What can I expect after the procedure? After the procedure, it is common to have:  Pain.  Bruising.  Swelling.  Numbness. Follow these instructions at home: Incision care   Follow instructions from your health care provider about how to take care of your incisions. Make sure you: ? Wash your hands with soap and water before and after you change your bandages (dressings). If soap and water are not available, use hand sanitizer. ? Change your dressings as told by your health care provider. ? Leave stitches (sutures), skin glue, or adhesive strips in place. These skin closures may need to stay in place  for 2 weeks or longer. If adhesive strip edges start to loosen and curl up, you may trim the loose edges. Do not remove adhesive strips completely unless your health care provider tells you to do that.  Check your incision areas every day for signs of infection. Check for: ? More redness, swelling, or pain. ? Fluid or blood. ? Warmth. ? Pus or a bad smell. Medicines  Take over-the-counter and prescription medicines only as told by your health care provider.  Ask your health care provider if the medicine prescribed to you requires you to avoid driving or using heavy machinery. General instructions  Raise (elevate) your legs above the level of your heart while you are sitting or lying down.  Avoid crossing your legs.  Avoid sitting for long periods of time. Change positions every 30 minutes.  Do any exercises your health care providers have given you. These may include deep breathing, coughing, and walking exercises.  Do not take baths, swim, or use a hot tub until your health care provider approves. Ask your health care provider if you may take showers. You may only be allowed to take sponge baths.  Wear compression stockings as told by your health care provider. These stockings help to prevent blood clots and reduce swelling in your legs.  Keep all follow-up visits as told by your health care provider. This is important. Contact a health care provider if:  Medicine does not help your pain.  Your pain gets worse.  You have new leg bruises or your leg bruises get bigger.  Your leg feels numb.  You have more redness, swelling, or pain around your incision.  You have fluid or blood coming from your incision.  Your incision feels warm to the touch.  You have pus or a bad smell coming from your incision.  You have a fever. Get help right away if:  Your pain is severe.  You develop pain, tenderness, warmth, redness, or swelling in any part of your leg.  You have chest  pain.  You have trouble breathing. Summary  Raise (elevate) your legs above the level of your heart while you are sitting or lying down.  Wear compression stockings as told by your health care provider.  Make sure you know which symptoms should prompt you to contact your health care provider.  Keep  all follow-up visits as told by your health care provider. This information is not intended to replace advice given to you by your health care provider. Make sure you discuss any questions you have with your health care provider. Document Released: 07/26/2011 Document Revised: 10/21/2018 Document Reviewed: 10/21/2018 Elsevier Patient Education  [employer].   Discharge Instructions:  1. You may shower, please wash incisions daily with soap and water and keep dry.  If you wish to cover wounds with dressing you may do so but please keep clean and change daily.  No tub baths or swimming until incisions have completely healed.  If your incisions become red or develop any drainage please call our office at [phone]  2. No Driving until cleared by our office and you are no longer using narcotic pain medications  3. Monitor your weight daily.. Please use the same scale and weigh at same time... If you gain 3-5 lbs in 48 hours with associated lower extremity swelling, please contact our office at [phone]  4. Fever of 101.5 for at least 24 hours, please contact our office at [phone]  5. Activity- up as tolerated, please walk at least 3 times per day.  Avoid strenuous activity, no lifting, pushing, or pulling with your arms over 8-10 lbs for a minimum of 6 weeks  6. If any questions or concerns arise, please do not hesitate to contact our office at [phone]

## 2019-08-17 NOTE — Plan of Care (Signed)
Discharge to home °

## 2019-08-17 NOTE — Progress Notes (Signed)
Discharge instructions reviewed with patient at this time. All questions answered.  

## 2019-08-17 NOTE — Progress Notes (Addendum)
      [address]       [place], [address]             [phone]      5 Days Post-Op Procedure(s) (LRB): CORONARY ARTERY BYPASS GRAFTING (CABG)X 4 (N/A) TRANSESOPHAGEAL ECHOCARDIOGRAM (TEE) (N/A) Indocyanine Green Fluorescence Imaging (Icg) (N/A) Subjective: Feels good this morning and wants to go home.  Objective: Vital signs in last 24 hours: Temp:  [98.2 F (36.8 C)-99.1 F (37.3 C)] 99.1 F (37.3 C) (09/20 0550) Pulse Rate:  [78-86] 78 (09/20 0550) Cardiac Rhythm: Normal sinus rhythm (09/20 0700) Resp:  [15-26] 15 (09/20 0550) BP: (107-147)/(61-78) 147/72 (09/20 0550) SpO2:  [89 %-100 %] 99 % (09/20 0550) Weight:  [88.4 kg] 88.4 kg (09/20 0550)     Intake/Output from previous day: 09/19 0701 - 09/20 0700 In: 720 [P.O.:720] Out: 1425 [Urine:1425] Intake/Output this shift: No intake/output data recorded.  General appearance: alert, cooperative and no distress Heart: regular rate and rhythm, S1, S2 normal, no murmur, click, rub or gallop Lungs: clear to auscultation bilaterally Abdomen: soft, non-tender; bowel sounds normal; no masses,  no organomegaly Extremities: extremities normal, atraumatic, no cyanosis or edema Wound: clean and dry  Lab Results: No results for input(s): WBC, HGB, HCT, PLT in the last 72 hours. BMET:  Recent Labs    08/15/19 1101 08/16/19 0325  NA 136 137  K 3.7 3.1*  CL 104 102  CO2 21* 23  GLUCOSE 126* 108*  BUN 14 13  CREATININE 0.85 0.89  CALCIUM 8.2* 7.9*    PT/INR: No results for input(s): LABPROT, INR in the last 72 hours. ABG    Component Value Date/Time   PHART 7.363 08/12/2019 2022   HCO3 18.2 (L) 08/12/2019 2022   TCO2 19 (L) 08/12/2019 2022   ACIDBASEDEF 6.0 (H) 08/12/2019 2022   O2SAT 99.0 08/12/2019 2022   CBG (last 3)  Recent Labs    08/16/19 1652 08/16/19 2216 08/17/19 0614  GLUCAP 145* 141* 119*    Assessment/Plan: S/P Procedure(s) (LRB): CORONARY ARTERY BYPASS GRAFTING (CABG)X  4 (N/A) TRANSESOPHAGEAL ECHOCARDIOGRAM (TEE) (N/A) Indocyanine Green Fluorescence Imaging (Icg) (N/A)  1. CV-NSR in the 70s, BP better controlled. Continue lisinopril to 10mg .Continue metoprolol at 25mg  BID and Isordil 10mg  TID.  2. Pulm-Tolerating room air with good oxygen saturation.  3. Renal-creatinine 0.89, potassium replaced  4. Blod glucose well controlled 5. H and H stable 7.8/26.1, expected acute blood loss anemia  Plan: Likely discharge today if BMET okay. Instructions and limitations reviewed with the patient.   Potassium: 3.0, ordered 40MEQ BID in addition to the 20MEQ already given. Will discuss with Dr. Orvan Seen if discharge appropriate.    LOS: 5 days    Elgie Collard 08/17/2019

## 2019-08-17 NOTE — Progress Notes (Signed)
Chest tube sutures removed and steri strips applied. Patient tolerated well. IV's discontinued at this time. Patient up to bedside to eat lunch.   Emelda Fear, RN

## 2019-08-18 ENCOUNTER — Telehealth: Payer: Self-pay

## 2019-08-18 NOTE — Telephone Encounter (Signed)
Transition Care Management Follow-up Telephone Call  Date of discharge and from where: [facility] on 08/17/19.  How have you been since you were released from the hospital? Pt states he's doing ok, no better and no worse. Pt states he is still weak and is not moving around much. When pt moves his right arm hurts due to the incision site. Declined fever, SOB, swelling, wound infection s/s or n/v/d.   Any questions or concerns? No   Items Reviewed:  Did the pt receive and understand the discharge instructions provided? Yes   Medications obtained and verified? No, pt declines going through medications over the phone at this time.   Any new allergies since your discharge? No  Dietary orders reviewed? Yes  Do you have support at home? Yes   Other (ie: DME, Home Health, etc) N/A  Functional Questionnaire: (I = Independent and D = Dependent)  Bathing/Dressing- Has assistance from wife.   Meal Prep- Wife does meal prepping.   Eating- I  Maintaining continence- I  Transferring/Ambulation- I  Managing Meds- I   Follow up appointments reviewed:    PCP Hospital f/u appt confirmed? Yes, scheduled to see Dr Caryn Section on 08/26/19 @ 11:00 AM for a 6 month follow up. Pt declines scheduling a separate apt sooner for a HFU.  [facility] f/u appt confirmed? Yes    Are transportation arrangements needed? No   If their condition worsens, is the pt aware to call  their PCP or go to the ED? Yes  Was the patient provided with contact information for the PCP's office or ED? Yes  Was the pt encouraged to call back with questions or concerns? Yes

## 2019-08-18 NOTE — Telephone Encounter (Signed)
No HFU scheduled, however a 6 month f/u is scheduled for 08/20/19.

## 2019-08-20 ENCOUNTER — Ambulatory Visit: Payer: Medicare HMO | Admitting: Family Medicine

## 2019-08-20 ENCOUNTER — Other Ambulatory Visit: Payer: Self-pay | Admitting: Cardiothoracic Surgery

## 2019-08-20 DIAGNOSIS — Z951 Presence of aortocoronary bypass graft: Secondary | ICD-10-CM

## 2019-08-20 NOTE — Progress Notes (Unsigned)
cxr 

## 2019-08-22 ENCOUNTER — Other Ambulatory Visit: Payer: Self-pay | Admitting: *Deleted

## 2019-08-22 ENCOUNTER — Ambulatory Visit
Admission: RE | Admit: 2019-08-22 | Discharge: 2019-08-22 | Disposition: A | Discharge status: Home / Self Care | Payer: Medicare HMO | Source: Ambulatory Visit | Attending: Cardiothoracic Surgery | Admitting: Cardiothoracic Surgery

## 2019-08-22 ENCOUNTER — Ambulatory Visit (INDEPENDENT_AMBULATORY_CARE_PROVIDER_SITE_OTHER): Payer: Self-pay | Admitting: Cardiothoracic Surgery

## 2019-08-22 ENCOUNTER — Encounter: Payer: Self-pay | Admitting: Cardiothoracic Surgery

## 2019-08-22 ENCOUNTER — Other Ambulatory Visit: Payer: Self-pay

## 2019-08-22 VITALS — BP 147/65 | HR 72 | Temp 97.7°F | Resp 18 | Ht 73.0 in | Wt 195.6 lb

## 2019-08-22 DIAGNOSIS — Z951 Presence of aortocoronary bypass graft: Secondary | ICD-10-CM | POA: Diagnosis not present

## 2019-08-22 MED ORDER — FUROSEMIDE 20 MG PO TABS: 20.0000 mg | ORAL_TABLET | Freq: Every day | ORAL | 11 refills | Status: DC

## 2019-08-25 NOTE — Progress Notes (Signed)
[address]       [place] [address]             [phone]     CARDIOTHORACIC SURGERY OFFICE NOTE  Referring Provider is [name], MD Primary Cardiologist is No primary care provider on file. PCP is [name], MD   HPI:  68 yo man returns for initial f/u assessment after recent hospitalization for CABG. Has done well at home; denies angina, but he does report persistent SOB. He has a known elevated R hemidiaphragm which was present before CABG. Notes mild peripheral edema. Appetite is good. Denies significant weight loss.    Current Outpatient Medications  Medication Sig Dispense Refill  . acetaminophen (TYLENOL) 500 MG tablet Take 1,000 mg by mouth every 6 (six) hours as needed for moderate pain.    [name] amiodarone (PACERONE) 400 MG tablet Take 0.5 tablets (200 mg total) by mouth 2 (two) times daily. 60 tablet 1  . aspirin 81 MG tablet Take 81 mg by mouth daily.     . clopidogrel (PLAVIX) 75 MG tablet Take 1 tablet (75 mg total) by mouth daily. 30 tablet 1  . isosorbide dinitrate (ISORDIL) 10 MG tablet Take 1 tablet (10 mg total) by mouth 3 (three) times daily. 90 tablet 1  . lisinopril (ZESTRIL) 10 MG tablet Take 1 tablet (10 mg total) by mouth daily. 30 tablet 1  . metFORMIN (GLUCOPHAGE) 500 MG tablet TAKE TWO TABLETS BY MOUTH TWICE DAILY WITH A MEAL (Patient taking differently: Take 500 mg by mouth daily with breakfast. ) 360 tablet 4  . metoprolol tartrate (LOPRESSOR) 25 MG tablet Take 1 tablet (25 mg total) by mouth every 8 (eight) hours. 90 tablet 1  . oxyCODONE (OXY IR/ROXICODONE) 5 MG immediate release tablet Take 1 tablet (5 mg total) by mouth every 6 (six) hours as needed for severe pain. 30 tablet 0  . rosuvastatin (CRESTOR) 20 MG tablet Take 1 tablet (20 mg total) by mouth daily at 6 PM. 30 tablet 1  . furosemide (LASIX) 20 MG tablet Take 1 tablet (20 mg total) by mouth daily. 5 tablet 11   No current facility-administered  medications for this visit.       Physical Exam:   BP (!) 147/65 (BP Location: Right Arm, Patient Position: Sitting, Cuff Size: Normal)   Pulse 72   Temp 97.7 F (36.5 C)   Resp 18   Ht 6\' 1"  (1.854 m)   Wt 88.7 kg   SpO2 95% Comment: RA  BMI 25.81 kg/m   General:  Well-appearing, NAD  Chest:   Decreased BS right base  CV:   rrr  Incisions:  C/d/i  Abdomen:  sntnd  Extremities:  No edema  Diagnostic Tests:  CXR again showing elevated R hemidiaphragm; very mild pulm edema  Impression:  Doing well after CABG;  Plan:  Lasix for a few days  Assess diaphragm with sniff test fluoroscopy F/u in 3-4 weeks--will have him see Dr. Kipp Brood to consider right diaphragm plication.   I spent in excess of 15 minutes during the conduct of this office consultation and >50% of this time involved direct face-to-face encounter with the patient for counseling and/or coordination of their care.  Level 2                 10 minutes Level 3                 15 minutes  Level 4                 25 minutes Level 5                 40 minutes  B. Murvin Natal, MD 08/25/2019 7:05 AM

## 2019-08-26 ENCOUNTER — Encounter: Payer: Self-pay | Admitting: Family Medicine

## 2019-08-26 ENCOUNTER — Other Ambulatory Visit: Payer: Self-pay

## 2019-08-26 ENCOUNTER — Ambulatory Visit (INDEPENDENT_AMBULATORY_CARE_PROVIDER_SITE_OTHER): Payer: Medicare HMO | Admitting: Family Medicine

## 2019-08-26 VITALS — BP 131/68 | HR 65 | Temp 97.3°F | Resp 16 | Ht 73.0 in | Wt 189.4 lb

## 2019-08-26 DIAGNOSIS — Z23 Encounter for immunization: Secondary | ICD-10-CM | POA: Diagnosis not present

## 2019-08-26 DIAGNOSIS — R809 Proteinuria, unspecified: Secondary | ICD-10-CM | POA: Diagnosis not present

## 2019-08-26 DIAGNOSIS — M5416 Radiculopathy, lumbar region: Secondary | ICD-10-CM | POA: Diagnosis not present

## 2019-08-26 DIAGNOSIS — N529 Male erectile dysfunction, unspecified: Secondary | ICD-10-CM | POA: Diagnosis not present

## 2019-08-26 DIAGNOSIS — E1129 Type 2 diabetes mellitus with other diabetic kidney complication: Secondary | ICD-10-CM | POA: Diagnosis not present

## 2019-08-26 MED ORDER — HYDROCODONE-ACETAMINOPHEN 10-325 MG PO TABS: 1.0000 | ORAL_TABLET | Freq: Four times a day (QID) | ORAL | 0 refills | Status: DC | PRN

## 2019-08-26 MED ORDER — SILDENAFIL CITRATE 100 MG PO TABS: 50.0000 mg | ORAL_TABLET | Freq: Every day | ORAL | 11 refills | Status: DC | PRN

## 2019-08-26 NOTE — Patient Instructions (Addendum)
. Please review the attached list of medications and notify my office if there are any errors.   . Please bring all of your medications to every appointment so we can make sure that our medication list is the same as yours.   . It is especially important to get the annual flu vaccine this year. If you haven't had it already, please go to your pharmacy or call the office as soon as possible to schedule you flu shot. .  Pneumococcal Vaccine, Polyvalent solution for injection What is this medicine? PNEUMOCOCCAL VACCINE, POLYVALENT (NEU mo KOK al vak SEEN, pol ee VEY luhnt) is a vaccine to prevent pneumococcus bacteria infection. These bacteria are a major cause of ear infections, Strep throat infections, and serious pneumonia, meningitis, or blood infections worldwide. These vaccines help the body to produce antibodies (protective substances) that help your body defend against these bacteria. This vaccine is recommended for people 67 years of age and older with health problems. It is also recommended for all adults over 38 years old. This vaccine will not treat an infection. This medicine may be used for other purposes; ask your health care provider or pharmacist if you have questions. COMMON BRAND NAME(S): Pneumovax 23 What should I tell my health care provider before I take this medicine? They need to know if you have any of these conditions:  bleeding problems  bone marrow or organ transplant  cancer, Hodgkin's disease  fever  infection  immune system problems  low platelet count in the blood  seizures  an unusual or allergic reaction to pneumococcal vaccine, diphtheria toxoid, other vaccines, latex, other medicines, foods, dyes, or preservatives  pregnant or trying to get pregnant  breast-feeding How should I use this medicine? This vaccine is for injection into a muscle or under the skin. It is given by a health care professional. A copy of Vaccine Information Statements will be  given before each vaccination. Read this sheet carefully each time. The sheet may change frequently. Talk to your pediatrician regarding the use of this medicine in children. While this drug may be prescribed for children as young as 51 years of age for selected conditions, precautions do apply. Overdosage: If you think you have taken too much of this medicine contact a poison control center or emergency room at once. NOTE: This medicine is only for you. Do not share this medicine with others. What if I miss a dose? It is important not to miss your dose. Call your doctor or health care professional if you are unable to keep an appointment. What may interact with this medicine?  medicines for cancer chemotherapy  medicines that suppress your immune function  medicines that treat or prevent blood clots like warfarin, enoxaparin, and dalteparin  steroid medicines like prednisone or cortisone This list may not describe all possible interactions. Give your health care provider a list of all the medicines, herbs, non-prescription drugs, or dietary supplements you use. Also tell them if you smoke, drink alcohol, or use illegal drugs. Some items may interact with your medicine. What should I watch for while using this medicine? Mild fever and pain should go away in 3 days or less. Report any unusual symptoms to your doctor or health care professional. What side effects may I notice from receiving this medicine? Side effects that you should report to your doctor or health care professional as soon as possible:  allergic reactions like skin rash, itching or hives, swelling of the face, lips, or tongue  breathing problems  confused  fever over 102 degrees F  pain, tingling, numbness in the hands or feet  seizures  unusual bleeding or bruising  unusual muscle weakness Side effects that usually do not require medical attention (report to your doctor or health care professional if they continue  or are bothersome):  aches and pains  diarrhea  fever of 102 degrees F or less  headache  irritable  loss of appetite  pain, tender at site where injected  trouble sleeping This list may not describe all possible side effects. Call your doctor for medical advice about side effects. You may report side effects to FDA at 1-800-FDA-1088. Where should I keep my medicine? This does not apply. This vaccine is given in a clinic, pharmacy, doctor's office, or other health care setting and will not be stored at home. NOTE: This sheet is a summary. It may not cover all possible information. If you have questions about this medicine, talk to your doctor, pharmacist, or health care provider.  2020 Elsevier/Gold Standard (2008-06-19 14:32:37)

## 2019-08-26 NOTE — Progress Notes (Signed)
Patient: Craig York Male    DOB: 01-26-1951   68 y.o.   MRN: [id number] Visit Date: 08/26/2019  Today's Provider: Lelon Huh, MD   Chief Complaint  Patient presents with  . Diabetes   Subjective:     HPI  Diabetes Mellitus Type II, Follow-up:   Lab Results  Component Value Date   HGBA1C 5.9 (H) 08/08/2019   HGBA1C 6.4 (H) 07/01/2019   HGBA1C 6.7 (H) 02/11/2019    Last seen for diabetes 6 months ago.  Management since then includes no changes. He reports excellent compliance with treatment. He is not having side effects.  Current symptoms include none and have been stable. Home blood sugar records: fasting range: 115  Episodes of hypoglycemia? no   Current insulin regiment: Is not on insulin Most Recent Eye Exam: UTD Weight trend: stable Prior visit with dietician: No Current exercise: none Current diet habits: in general, a "healthy" diet    Pertinent Labs:    Component Value Date/Time   CHOL 137 07/01/2019 1050   TRIG 120 07/01/2019 1050   HDL 41 07/01/2019 1050   LDLCALC 72 07/01/2019 1050   CREATININE 1.05 08/17/2019 0939    Wt Readings from Last 3 Encounters:  08/26/19 189 lb 6.4 oz (85.9 kg)  08/22/19 195 lb 9.6 oz (88.7 kg)  08/17/19 194 lb 14.4 oz (88.4 kg)   ------------------------------------------------------------------------  Follow up for CAD  The patient was last seen for this 1 months ago. Changes made at last visit include referral to cardiologist. Patient had coronary bypass on 08/12/2019.  He reports excellent compliance with treatment. He feels that condition is Improved. He is not having side effects.   ------------------------------------------------------------------------------------  Follow up for hypertension  The patient was last seen for this 1 months ago. Changes made at last visit include start metoprolol.  He reports excellent compliance with treatment. He feels that condition is Improved. He is  not having side effects.   ------------------------------------------------------------------------------------  He also requests refill for viagra for ED and hydrocodone/apap for back pain which he takes a few times a week and tolerates well.   Allergies  Allergen Reactions  . Simvastatin     Elevated CK  . Penicillins Rash    Did it involve swelling of the face/tongue/throat, SOB, or low BP? No Did it involve sudden or severe rash/hives, skin peeling, or any reaction on the inside of your mouth or nose? No Did you need to seek medical attention at a hospital or doctor's office? No When did it last happen? If all above answers are "NO", may proceed with cephalosporin use.      Current Outpatient Medications:  .  acetaminophen (TYLENOL) 500 MG tablet, Take 1,000 mg by mouth every 6 (six) hours as needed for moderate pain., Disp: , Rfl:  .  amiodarone (PACERONE) 400 MG tablet, Take 0.5 tablets (200 mg total) by mouth 2 (two) times daily., Disp: 60 tablet, Rfl: 1 .  aspirin 81 MG tablet, Take 81 mg by mouth daily. , Disp: , Rfl:  .  clopidogrel (PLAVIX) 75 MG tablet, Take 1 tablet (75 mg total) by mouth daily., Disp: 30 tablet, Rfl: 1 .  furosemide (LASIX) 20 MG tablet, Take 1 tablet (20 mg total) by mouth daily., Disp: 5 tablet, Rfl: 11 .  isosorbide dinitrate (ISORDIL) 10 MG tablet, Take 1 tablet (10 mg total) by mouth 3 (three) times daily., Disp: 90 tablet, Rfl: 1 .  lisinopril (ZESTRIL) 10  MG tablet, Take 1 tablet (10 mg total) by mouth daily., Disp: 30 tablet, Rfl: 1 .  metFORMIN (GLUCOPHAGE) 500 MG tablet, TAKE TWO TABLETS BY MOUTH TWICE DAILY WITH A MEAL (Patient taking differently: Take 500 mg by mouth daily with breakfast. ), Disp: 360 tablet, Rfl: 4 .  metoprolol tartrate (LOPRESSOR) 25 MG tablet, Take 1 tablet (25 mg total) by mouth every 8 (eight) hours., Disp: 90 tablet, Rfl: 1 .  oxyCODONE (OXY IR/ROXICODONE) 5 MG immediate release tablet, Take 1 tablet (5 mg total)  by mouth every 6 (six) hours as needed for severe pain., Disp: 30 tablet, Rfl: 0 .  rosuvastatin (CRESTOR) 20 MG tablet, Take 1 tablet (20 mg total) by mouth daily at 6 PM., Disp: 30 tablet, Rfl: 1  Review of Systems  Constitutional: Negative.   Eyes: Negative.   Respiratory: Positive for shortness of breath.   Cardiovascular: Negative.   Endocrine: Negative.     Social History   Tobacco Use  . Smoking status: Former Research scientist (life sciences)  . Smokeless tobacco: Former Systems developer    Types: Chew    Quit date: 11/27/1968  . Tobacco comment: used 2 packs per week; quit over 40 years ago  Substance Use Topics  . Alcohol use: Yes    Alcohol/week: 6.0 standard drinks    Types: 6 Cans of beer per week      Objective:   BP 131/68 (BP Location: Left Arm, Patient Position: Sitting, Cuff Size: Normal)   Pulse 65   Temp (!) 97.3 F (36.3 C) (Temporal)   Resp 16   Ht 6\' 1"  (1.854 m)   Wt 189 lb 6.4 oz (85.9 kg)   BMI 24.99 kg/m  Vitals:   08/26/19 1115  BP: 131/68  Pulse: 65  Resp: 16  Temp: (!) 97.3 F (36.3 C)  TempSrc: Temporal  Weight: 189 lb 6.4 oz (85.9 kg)  Height: 6\' 1"  (1.854 m)  Body mass index is 24.99 kg/m.   Physical Exam   General Appearance:    Well developed, well nourished  male in no acute distress  Eyes:    PERRL, conjunctiva/corneas clear, EOM's intact       Lungs:     Clear to auscultation bilaterally, respirations unlabored  Heart:    Normal heart rate. Normal rhythm. No murmurs, rubs, or gallops.   MS:   All extremities are intact.   Neurologic:   Awake, alert, oriented x 3. No apparent focal neurological           defect.         Assessment & Plan    1. Type 2 diabetes mellitus with microalbuminuria, without long-term current use of insulin (HCC) Well controlled.  Continue current medications.    2. Need for pneumococcal vaccination  - Pneumococcal polysaccharide vaccine 23-valent greater than or equal to 2yo subcutaneous/IM  3. Lumbar radiculopathy refill-  HYDROcodone-acetaminophen (NORCO) 10-325 MG tablet; Take 1 tablet by mouth every 6 (six) hours as needed. Every 6 hours, as needed  Dispense: 30 tablet; Refill: 0  4. Erectile dysfunction, unspecified erectile dysfunction type Refill - sildenafil (VIAGRA) 100 MG tablet; Take 0.5-1 tablets (50-100 mg total) by mouth daily as needed for erectile dysfunction.  Dispense: 5 tablet; Refill: 11  The entirety of the information documented in the History of Present Illness, Review of Systems and Physical Exam were personally obtained by me. Portions of this information were initially documented by Lynford Humphrey, CMA and reviewed by me for thoroughness and accuracy.  Lelon Huh, MD  [facility]

## 2019-08-28 ENCOUNTER — Inpatient Hospital Stay: Admission: RE | Admit: 2019-08-28 | Payer: Medicare HMO | Source: Ambulatory Visit

## 2019-09-01 ENCOUNTER — Other Ambulatory Visit: Payer: Self-pay | Admitting: Family Medicine

## 2019-09-01 DIAGNOSIS — R011 Cardiac murmur, unspecified: Secondary | ICD-10-CM | POA: Diagnosis not present

## 2019-09-01 DIAGNOSIS — I2510 Atherosclerotic heart disease of native coronary artery without angina pectoris: Secondary | ICD-10-CM | POA: Diagnosis not present

## 2019-09-01 DIAGNOSIS — E119 Type 2 diabetes mellitus without complications: Secondary | ICD-10-CM | POA: Diagnosis not present

## 2019-09-01 DIAGNOSIS — E782 Mixed hyperlipidemia: Secondary | ICD-10-CM | POA: Diagnosis not present

## 2019-09-01 DIAGNOSIS — R079 Chest pain, unspecified: Secondary | ICD-10-CM | POA: Diagnosis not present

## 2019-09-01 DIAGNOSIS — M5416 Radiculopathy, lumbar region: Secondary | ICD-10-CM

## 2019-09-01 DIAGNOSIS — I10 Essential (primary) hypertension: Secondary | ICD-10-CM | POA: Diagnosis not present

## 2019-09-01 DIAGNOSIS — I208 Other forms of angina pectoris: Secondary | ICD-10-CM | POA: Diagnosis not present

## 2019-09-01 DIAGNOSIS — R0602 Shortness of breath: Secondary | ICD-10-CM | POA: Diagnosis not present

## 2019-09-01 MED ORDER — HYDROCODONE-ACETAMINOPHEN 10-325 MG PO TABS: 1.0000 | ORAL_TABLET | Freq: Four times a day (QID) | ORAL | 0 refills | Status: DC | PRN

## 2019-09-01 MED ORDER — METFORMIN HCL 500 MG PO TABS: ORAL_TABLET | ORAL | 4 refills | Status: DC

## 2019-09-01 NOTE — Telephone Encounter (Signed)
Pt needing refills on:  metFORMIN (GLUCOPHAGE) 500 MG tablet HYDROcodone-acetaminophen (NORCO) 10-325 MG tablet  Please fill at:  [facility] (N), [place] [address] [phone] (Phone) [phone] (Fax)    Thanks, [employer]

## 2019-09-04 ENCOUNTER — Other Ambulatory Visit: Payer: Self-pay | Admitting: Cardiothoracic Surgery

## 2019-09-04 DIAGNOSIS — Z951 Presence of aortocoronary bypass graft: Secondary | ICD-10-CM

## 2019-09-08 ENCOUNTER — Telehealth: Payer: Self-pay

## 2019-09-08 DIAGNOSIS — M5416 Radiculopathy, lumbar region: Secondary | ICD-10-CM

## 2019-09-08 LAB — ECHO INTRAOPERATIVE TEE
Height: 73 in
Weight: 3177.59 oz

## 2019-09-08 MED ORDER — OXYCODONE-ACETAMINOPHEN 10-325 MG PO TABS: 1.0000 | ORAL_TABLET | Freq: Three times a day (TID) | ORAL | 0 refills | Status: DC | PRN

## 2019-09-08 NOTE — Telephone Encounter (Signed)
Patient calling that the HYDROcodone-acetaminophen (NORCO) 10-325 MG tablet is not working for him and that he wants to go back to the old prescription   oxyCODONE (OXY IR/ROXICODONE) 5 MG immediate release tablet

## 2019-09-08 NOTE — Telephone Encounter (Signed)
Please review. KW 

## 2019-09-10 ENCOUNTER — Ambulatory Visit
Admission: RE | Admit: 2019-09-10 | Discharge: 2019-09-10 | Disposition: A | Discharge status: Home / Self Care | Payer: Medicare HMO | Source: Ambulatory Visit | Attending: Cardiothoracic Surgery | Admitting: Cardiothoracic Surgery

## 2019-09-10 ENCOUNTER — Other Ambulatory Visit: Payer: Medicare HMO

## 2019-09-10 DIAGNOSIS — J986 Disorders of diaphragm: Secondary | ICD-10-CM | POA: Diagnosis not present

## 2019-09-10 DIAGNOSIS — Z951 Presence of aortocoronary bypass graft: Secondary | ICD-10-CM

## 2019-09-12 ENCOUNTER — Other Ambulatory Visit: Payer: Self-pay

## 2019-09-12 DIAGNOSIS — M5416 Radiculopathy, lumbar region: Secondary | ICD-10-CM

## 2019-09-12 NOTE — Telephone Encounter (Signed)
Patient requested the 5 mg that Dr Orvan Seen gave him.  Last got from him on 9/20 after he got out of the hospital.  He states he got the 10/325 last week and has plenty of them.

## 2019-09-15 MED ORDER — OXYCODONE-ACETAMINOPHEN 10-325 MG PO TABS: 1.0000 | ORAL_TABLET | Freq: Three times a day (TID) | ORAL | 0 refills | Status: DC | PRN

## 2019-09-15 NOTE — Telephone Encounter (Signed)
Please advise. KW 

## 2019-09-15 NOTE — Progress Notes (Addendum)
Subjective:   Craig York is a 68 y.o. male who presents for Medicare Annual/Subsequent preventive examination.    This visit is being conducted through telemedicine due to the COVID-19 pandemic. This patient has given me verbal consent via doximity to conduct this visit, patient states they are participating from their home address. Some vital signs may be absent or patient reported.    Patient identification: identified by name, DOB, and current address  Review of Systems:  N/A  Cardiac Risk Factors include: advanced age (>28men, >46 women);diabetes mellitus;dyslipidemia;hypertension;male gender     Objective:    Vitals: There were no vitals taken for this visit.  There is no height or weight on file to calculate BMI. Unable to obtain vitals due to visit being conducted via telephonically.   Advanced Directives 09/16/2019 08/12/2019 08/08/2019 07/21/2019 09/12/2018  Does Patient Have a Medical Advance Directive? Yes Yes Yes No No  Type of Paramedic of [place];Living will [place];Living will Living will;Healthcare Power of Attorney - -  Does patient want to make changes to medical advance directive? - No - Patient declined - - No - Patient declined  Copy of [place] in Chart? No - copy requested No - copy requested No - copy requested - -  Would patient like information on creating a medical advance directive? - - - No - Patient declined -    Tobacco Social History   Tobacco Use  Smoking Status Former Smoker  Smokeless Tobacco Former Systems developer  . Types: Chew  . Quit date: 11/27/1968  Tobacco Comment   used 2 packs per week; quit over 40 years ago     Counseling given: Not Answered Comment: used 2 packs per week; quit over 40 years ago   Clinical Intake:  Pre-visit preparation completed: Yes  Pain : 0-10 Pain Score: 7  Pain Type: Acute pain(Due to recent to CABG. Has followed up with cardiology.) Pain  Location: Chest Pain Orientation: Upper Pain Descriptors / Indicators: Sore Pain Frequency: Constant     Nutritional Risks: None Diabetes: Yes  How often do you need to have someone help you when you read instructions, pamphlets, or other written materials from your doctor or pharmacy?: 1 - Never   Diabetes:  Is the patient diabetic?  Yes type 2 If diabetic, was a CBG obtained today?  No  Did the patient bring in their glucometer from home?  No  How often do you monitor your CBG's? Twice a week.   Financial Strains and Diabetes Management:  Are you having any financial strains with the device, your supplies or your medication? No .  Does the patient want to be seen by Chronic Care Management for management of their diabetes?  No  Would the patient like to be referred to a Nutritionist or for Diabetic Management?  No   Diabetic Exams:  Diabetic Eye Exam: Completed 02/2019 with Dr Gloriann Loan per pt. Requested records to be faxed clinic.   Diabetic Foot Exam: Completed 01/23/13. Pt has been advised about the importance in completing this exam.  Note made to follow up on this at next in office visit.    Interpreter Needed?: No  Information entered by :: [employer], LPN  Past Medical History:  Diagnosis Date  . Anginal pain ([place])   . Benign neoplasm of descending colon   . BPH (benign prostatic hyperplasia)   . Coronary artery disease   . Diabetes mellitus without complication ([place])   .  Hemorrhage of gastrointestinal tract 07/23/2008  . Hyperlipidemia   . Hypertension   . Iron deficiency anemia   . Myocardial infarction ([place])   . Polyp of sigmoid colon    Past Surgical History:  Procedure Laterality Date  . CARDIAC CATHETERIZATION    . chest surgery for fungal infection    . COLONOSCOPY WITH PROPOFOL N/A 07/10/2017   Procedure: COLONOSCOPY WITH PROPOFOL;  Surgeon: [name], MD;  Location: [facility] ENDOSCOPY;  Service: Endoscopy;  Laterality: N/A;  . CORONARY ARTERY BYPASS  GRAFT N/A 08/12/2019   Procedure: CORONARY ARTERY BYPASS GRAFTING (CABG)X 4;  Surgeon: [name], MD;  Location: [facility];  Service: Open Heart Surgery;  Laterality: N/A;  CABG x  4  using bilateral internal mammary arteries and endoscopically harvested left saphenous vein  . ESOPHAGOGASTRODUODENOSCOPY (EGD) WITH PROPOFOL N/A 07/10/2017   Procedure: ESOPHAGOGASTRODUODENOSCOPY (EGD) WITH PROPOFOL;  Surgeon: [name], MD;  Location[facility] ENDOSCOPY;  Service: Endoscopy;  Laterality: N/A;  . HERNIA REPAIR    . LEFT HEART CATH AND CORONARY ANGIOGRAPHY Left 07/21/2019   Procedure: LEFT HEART CATH AND CORONARY ANGIOGRAPHY;  Surgeon: [name], MD;  Location: [facility];  Service: Cardiovascular;  Laterality: Left;  . TEE WITHOUT CARDIOVERSION N/A 08/12/2019   Procedure: TRANSESOPHAGEAL ECHOCARDIOGRAM (TEE);  Surgeon: [name], MD;  Location: [place];  Service: Open Heart Surgery;  Laterality: N/A;   Family History  Problem Relation Age of Onset  . Hypertension Mother    Social History   Socioeconomic History  . Marital status: Legally Separated    Spouse name: Not on file  . Number of children: 2  . Years of education: Not on file  . Highest education level: High school graduate  Occupational History  . 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 Financial resource strain: Not hard at all  . Food insecurity    Worry: Never true    Inability: Never true  . Transportation needs    Medical: No    Non-medical: No  Tobacco Use  . Smoking status: Former Research scientist (life sciences)  . Smokeless tobacco: Former Systems developer    Types: Chew    Quit date: 11/27/1968  . Tobacco comment: used 2 packs per week; quit over 40 years ago  Substance and Sexual Activity  . Alcohol use: Not Currently    Alcohol/week: 0.0 standard drinks  . Drug use: No  . Sexual activity: Not on file  Lifestyle  . Physical activity    Days per week: 0 days     Minutes per session: 0 min  . Stress: Not at all  Relationships  . Social Herbalist on phone: Patient refused    Gets together: Patient refused    Attends religious service: Patient refused    Active member of club or organization: Patient refused    Attends meetings of clubs or organizations: Patient refused    Relationship status: Patient refused  Other Topics Concern  . Not on file  Social History Narrative  . Not on file    Outpatient Encounter Medications as of 09/16/2019  Medication Sig  . acetaminophen (TYLENOL) 500 MG tablet Take 1,000 mg by mouth every 6 (six) hours as needed for moderate pain.  [name] amiodarone (PACERONE) 400 MG tablet Take 0.5 tablets (200 mg total) by mouth 2 (two) times daily.  [name] aspirin 81 MG tablet Take 81 mg by mouth daily.   [name]  clopidogrel (PLAVIX) 75 MG tablet Take 1 tablet (75 mg total) by mouth daily.  . furosemide (LASIX) 20 MG tablet Take 1 tablet (20 mg total) by mouth daily.  . isosorbide dinitrate (ISORDIL) 10 MG tablet Take 1 tablet (10 mg total) by mouth 3 (three) times daily.  [name] lisinopril (ZESTRIL) 10 MG tablet Take 1 tablet (10 mg total) by mouth daily.  . metFORMIN (GLUCOPHAGE) 500 MG tablet TAKE TWO TABLETS BY MOUTH TWICE DAILY WITH A MEAL  . metoprolol tartrate (LOPRESSOR) 25 MG tablet Take 1 tablet (25 mg total) by mouth every 8 (eight) hours.  [name] oxyCODONE-acetaminophen (PERCOCET) 10-325 MG tablet Take 1 tablet by mouth every 8 (eight) hours as needed for pain.  . rosuvastatin (CRESTOR) 20 MG tablet Take 1 tablet (20 mg total) by mouth daily at 6 PM.  . sildenafil (VIAGRA) 100 MG tablet Take 0.5-1 tablets (50-100 mg total) by mouth daily as needed for erectile dysfunction.   No facility-administered encounter medications on file as of 09/16/2019.     Activities of Daily Living In your present state of health, do you have any difficulty performing the following activities: 09/16/2019 08/08/2019  Hearing? N N  Vision? N N   Difficulty concentrating or making decisions? N N  Walking or climbing stairs? N N  Dressing or bathing? N N  Doing errands, shopping? N N  Preparing Food and eating ? N -  Using the Toilet? N -  In the past six months, have you accidently leaked urine? N -  Do you have problems with loss of bowel control? N -  Managing your Medications? N -  Managing your Finances? N -  Housekeeping or managing your Housekeeping? N -  Some recent data might be hidden    Patient Care Team: [name], MD as PCP - General (Family Medicine) [name], MD as Consulting Physician (Cardiology) Lajuana Matte, MD as Consulting Physician (Thoracic Surgery)   Assessment:   This is a routine wellness examination for [place].  Exercise Activities and Dietary recommendations Current Exercise Habits: The patient does not participate in regular exercise at present, Exercise limited by: cardiac condition(s)(Recent CABG)  Goals    . DIET - REDUCE SODIUM INTAKE     Continue with heart healthy diet to help lower cholesterol and plaque build up.        Fall Risk Fall Risk  09/16/2019 09/16/2019 09/12/2018 03/05/2018 02/07/2017  Falls in the past year? 0 0 No No No  Number falls in past yr: 0 0 - - -  Injury with Fall? 0 0 - - -   FALL RISK PREVENTION PERTAINING TO THE HOME:  Any stairs in or around the home? Yes  If so, are there any without handrails? No   Home free of loose throw rugs in walkways, pet beds, electrical cords, etc? Yes  Adequate lighting in your home to reduce risk of falls? Yes   ASSISTIVE DEVICES UTILIZED TO PREVENT FALLS:  Life alert? Yes  Use of a cane, walker or w/c? No  Grab bars in the bathroom? No  Shower chair or bench in shower? No  Elevated toilet seat or a handicapped toilet? No    TIMED UP AND GO:  Was the test performed? No .    Depression Screen PHQ 2/9 Scores 09/16/2019 09/12/2018 03/05/2018 02/07/2017  PHQ - 2 Score 0 0 0 0  PHQ- 9 Score - -  0 0    Cognitive Function: Declined screening today.  Immunization History  Administered Date(s) Administered  . Pneumococcal Polysaccharide-23 08/26/2019  . Tdap 05/28/2017    Qualifies for Shingles Vaccine? Yes . Due for Shingrix. Education has been provided regarding the importance of this vaccine. Pt has been advised to call insurance company to determine out of pocket expense. Advised may also receive vaccine at local pharmacy or Health Dept. Verbalized acceptance and understanding.  Tdap: Up to date  Flu Vaccine: Due for Flu vaccine. Does the patient want to receive this vaccine today?  No .   Pneumococcal Vaccine: Up to date  Screening Tests Health Maintenance  Topic Date Due  . FOOT EXAM  11/09/1961  . OPHTHALMOLOGY EXAM  02/12/2019  . INFLUENZA VACCINE  02/25/2020 (Originally 06/28/2019)  . HEMOGLOBIN A1C  02/23/2020  . PNA vac Low Risk Adult (2 of 2 - PCV13) 08/25/2020  . COLONOSCOPY  07/10/2022  . TETANUS/TDAP  05/29/2027  . Hepatitis C Screening  Completed   Cancer Screenings:  Colorectal Screening: Completed 07/10/17. Repeat every 5 years.  Lung Cancer Screening: (Low Dose CT Chest recommended if Age 22-80 years, 30 pack-year currently smoking OR have quit w/in 15years.) does not qualify.   Additional Screening:  Hepatitis C Screening: Up to date  Dental Screening: Recommended annual dental exams for proper oral hygiene  Community Resource Referral:  CRR required this visit?  No        Plan:  I have personally reviewed and addressed the Medicare Annual Wellness questionnaire and have noted the following in the patient's chart:  A. Medical and social history B. Use of alcohol, tobacco or illicit drugs  C. Current medications and supplements D. Functional ability and status E.  Nutritional status F.  Physical activity G. Advance directives H. List of other physicians I.  Hospitalizations, surgeries, and ER visits in previous 12 months J.   [place] such as hearing and vision if needed, cognitive and depression L. Referrals and appointments   In addition, I have reviewed and discussed with patient certain preventive protocols, quality metrics, and best practice recommendations. A written personalized care plan for preventive services as well as general preventive health recommendations were provided to patient.   Glendora Score, LPN  [id number] Nurse Health Advisor  Nurse Notes: Pt needs a diabetic foot exam at next in office visit. Requested eye exam records.

## 2019-09-16 ENCOUNTER — Ambulatory Visit (INDEPENDENT_AMBULATORY_CARE_PROVIDER_SITE_OTHER): Payer: Medicare HMO

## 2019-09-16 ENCOUNTER — Other Ambulatory Visit: Payer: Self-pay

## 2019-09-16 DIAGNOSIS — Z0000 Encounter for general adult medical examination without abnormal findings: Secondary | ICD-10-CM

## 2019-09-16 NOTE — Patient Instructions (Addendum)
[name] , Thank you for taking time to come for your Medicare Wellness Visit. I appreciate your ongoing commitment to your health goals. Please review the following plan we discussed and let me know if I can assist you in the future.   Screening recommendations/referrals: Colonoscopy: Up to date, due 06/2022 Recommended yearly ophthalmology/optometry visit for glaucoma screening and checkup Recommended yearly dental visit for hygiene and checkup  Vaccinations: Influenza vaccine: Currently due Pneumococcal vaccine: Prevnar 13 due 08/25/20 Tdap vaccine: Up to date, due 05/2027 Shingles vaccine: Pt declines today.     Advanced directives: Please bring a copy of your POA (Power of Attorney) and/or Living Will to your next appointment.   Conditions/risks identified: Continue with heart healthy diet to help lower cholesterol and plaque build up.   Next appointment: 12/26/19 @ 11:00 AM with Dr Caryn Section. Declined scheduling an AWV for 2021 at this time.   Preventive Care 40 Years and Older, Male Preventive care refers to lifestyle choices and visits with your health care provider that can promote health and wellness. What does preventive care include?  A yearly physical exam. This is also called an annual well check.  Dental exams once or twice a year.  Routine eye exams. Ask your health care provider how often you should have your eyes checked.  Personal lifestyle choices, including:  Daily care of your teeth and gums.  Regular physical activity.  Eating a healthy diet.  Avoiding tobacco and drug use.  Limiting alcohol use.  Practicing safe sex.  Taking low doses of aspirin every day.  Taking vitamin and mineral supplements as recommended by your health care provider. What happens during an annual well check? The services and screenings done by your health care provider during your annual well check will depend on your age, overall health, lifestyle risk factors, and family  history of disease. Counseling  Your health care provider may ask you questions about your:  Alcohol use.  Tobacco use.  Drug use.  Emotional well-being.  Home and relationship well-being.  Sexual activity.  Eating habits.  History of falls.  Memory and ability to understand (cognition).  Work and work Statistician. Screening  You may have the following tests or measurements:  Height, weight, and BMI.  Blood pressure.  Lipid and cholesterol levels. These may be checked every 5 years, or more frequently if you are over 17 years old.  Skin check.  Lung cancer screening. You may have this screening every year starting at age 65 if you have a 30-pack-year history of smoking and currently smoke or have quit within the past 15 years.  Fecal occult blood test (FOBT) of the stool. You may have this test every year starting at age 62.  Flexible sigmoidoscopy or colonoscopy. You may have a sigmoidoscopy every 5 years or a colonoscopy every 10 years starting at age 48.  Prostate cancer screening. Recommendations will vary depending on your family history and other risks.  Hepatitis C blood test.  Hepatitis B blood test.  Sexually transmitted disease (STD) testing.  Diabetes screening. This is done by checking your blood sugar (glucose) after you have not eaten for a while (fasting). You may have this done every 1-3 years.  Abdominal aortic aneurysm (AAA) screening. You may need this if you are a current or former smoker.  Osteoporosis. You may be screened starting at age 64 if you are at high risk. Talk with your health care provider about your test results, treatment options, and if necessary,  the need for more tests. Vaccines  Your health care provider may recommend certain vaccines, such as:  Influenza vaccine. This is recommended every year.  Tetanus, diphtheria, and acellular pertussis (Tdap, Td) vaccine. You may need a Td booster every 10 years.  Zoster vaccine.  You may need this after age 84.  Pneumococcal 13-valent conjugate (PCV13) vaccine. One dose is recommended after age 69.  Pneumococcal polysaccharide (PPSV23) vaccine. One dose is recommended after age 97. Talk to your health care provider about which screenings and vaccines you need and how often you need them. This information is not intended to replace advice given to you by your health care provider. Make sure you discuss any questions you have with your health care provider. Document Released: 12/10/2015 Document Revised: 08/02/2016 Document Reviewed: 09/14/2015 Elsevier Interactive Patient Education  2017 [place] Prevention in the Home Falls can cause injuries. They can happen to people of all ages. There are many things you can do to make your home safe and to help prevent falls. What can I do on the outside of my home?  Regularly fix the edges of walkways and driveways and fix any cracks.  Remove anything that might make you trip as you walk through a door, such as a raised step or threshold.  Trim any bushes or trees on the path to your home.  Use bright outdoor lighting.  Clear any walking paths of anything that might make someone trip, such as rocks or tools.  Regularly check to see if handrails are loose or broken. Make sure that both sides of any steps have handrails.  Any raised decks and porches should have guardrails on the edges.  Have any leaves, snow, or ice cleared regularly.  Use sand or salt on walking paths during winter.  Clean up any spills in your garage right away. This includes oil or grease spills. What can I do in the bathroom?  Use night lights.  Install grab bars by the toilet and in the tub and shower. Do not use towel bars as grab bars.  Use non-skid mats or decals in the tub or shower.  If you need to sit down in the shower, use a plastic, non-slip stool.  Keep the floor dry. Clean up any water that spills on the floor as soon  as it happens.  Remove soap buildup in the tub or shower regularly.  Attach bath mats securely with double-sided non-slip rug tape.  Do not have throw rugs and other things on the floor that can make you trip. What can I do in the bedroom?  Use night lights.  Make sure that you have a light by your bed that is easy to reach.  Do not use any sheets or blankets that are too big for your bed. They should not hang down onto the floor.  Have a firm chair that has side arms. You can use this for support while you get dressed.  Do not have throw rugs and other things on the floor that can make you trip. What can I do in the kitchen?  Clean up any spills right away.  Avoid walking on wet floors.  Keep items that you use a lot in easy-to-reach places.  If you need to reach something above you, use a strong step stool that has a grab bar.  Keep electrical cords out of the way.  Do not use floor polish or wax that makes floors slippery. If you must use  wax, use non-skid floor wax.  Do not have throw rugs and other things on the floor that can make you trip. What can I do with my stairs?  Do not leave any items on the stairs.  Make sure that there are handrails on both sides of the stairs and use them. Fix handrails that are broken or loose. Make sure that handrails are as long as the stairways.  Check any carpeting to make sure that it is firmly attached to the stairs. Fix any carpet that is loose or worn.  Avoid having throw rugs at the top or bottom of the stairs. If you do have throw rugs, attach them to the floor with carpet tape.  Make sure that you have a light switch at the top of the stairs and the bottom of the stairs. If you do not have them, ask someone to add them for you. What else can I do to help prevent falls?  Wear shoes that:  Do not have high heels.  Have rubber bottoms.  Are comfortable and fit you well.  Are closed at the toe. Do not wear sandals.  If  you use a stepladder:  Make sure that it is fully opened. Do not climb a closed stepladder.  Make sure that both sides of the stepladder are locked into place.  Ask someone to hold it for you, if possible.  Clearly mark and make sure that you can see:  Any grab bars or handrails.  First and last steps.  Where the edge of each step is.  Use tools that help you move around (mobility aids) if they are needed. These include:  Canes.  Walkers.  Scooters.  Crutches.  Turn on the lights when you go into a dark area. Replace any light bulbs as soon as they burn out.  Set up your furniture so you have a clear path. Avoid moving your furniture around.  If any of your floors are uneven, fix them.  If there are any pets around you, be aware of where they are.  Review your medicines with your doctor. Some medicines can make you feel dizzy. This can increase your chance of falling. Ask your doctor what other things that you can do to help prevent falls. This information is not intended to replace advice given to you by your health care provider. Make sure you discuss any questions you have with your health care provider. Document Released: 09/09/2009 Document Revised: 04/20/2016 Document Reviewed: 12/18/2014 Elsevier Interactive Patient Education  2017 [employer].

## 2019-09-19 ENCOUNTER — Encounter: Payer: Medicare HMO | Admitting: Cardiothoracic Surgery

## 2019-09-19 ENCOUNTER — Encounter: Payer: Self-pay | Admitting: Thoracic Surgery (Cardiothoracic Vascular Surgery)

## 2019-09-22 ENCOUNTER — Encounter: Payer: Self-pay | Admitting: Thoracic Surgery (Cardiothoracic Vascular Surgery)

## 2019-09-22 ENCOUNTER — Other Ambulatory Visit: Payer: Self-pay

## 2019-09-22 ENCOUNTER — Ambulatory Visit (INDEPENDENT_AMBULATORY_CARE_PROVIDER_SITE_OTHER): Payer: Self-pay | Admitting: Thoracic Surgery (Cardiothoracic Vascular Surgery)

## 2019-09-22 VITALS — BP 138/85 | HR 72 | Temp 96.6°F | Resp 16 | Ht 73.0 in | Wt 187.0 lb

## 2019-09-22 DIAGNOSIS — Z951 Presence of aortocoronary bypass graft: Secondary | ICD-10-CM

## 2019-09-22 DIAGNOSIS — J986 Disorders of diaphragm: Secondary | ICD-10-CM

## 2019-09-22 NOTE — Telephone Encounter (Signed)
No encounter

## 2019-09-22 NOTE — Progress Notes (Signed)
[address]       [place] [address]             [phone]                    Craig York [name] Date of Birth: 1950-12-24  Referring: [name], MD Primary Care: [name], MD Primary Cardiologist: No primary care provider on file.  Chief Complaint:    Chief Complaint  Patient presents with  . Routine Post Op    s/p CABG X 4..08/12/19.Marland KitchenMarland KitchenELEVATED RIGHT HEMIDIAPHRAGM with SNIFF TEST 09/10/19    History of Present Illness:    Craig York 68 y.o. male s/p CABG with BIMA on Sept 15th developed a post-op elevated right hemidiaphragm.  His sniff test was positive for a paralyzed diaphragm.  He denies any shortness of breath with exertion or when laying flat.  He was able to walk 1 mile after being discharged from the hospital without difficulty.         Zubrod Score: At the time of surgery this patient's most appropriate activity status/level should be described as: [x]     0    Normal activity, no symptoms []     1    Restricted in physical strenuous activity but ambulatory, able to do out light work []     2    Ambulatory and capable of self care, unable to do work activities, up and about               >50 % of waking hours                              []     3    Only limited self care, in bed greater than 50% of waking hours []     4    Completely disabled, no self care, confined to bed or chair []     5    Moribund   Past Medical History:  Diagnosis Date  . Anginal pain ([place])   . Benign neoplasm of descending colon   . BPH (benign prostatic hyperplasia)   . Coronary artery disease   . Diabetes mellitus without complication ([place])   . Hemorrhage of gastrointestinal tract 07/23/2008  . Hyperlipidemia   . Hypertension   . Iron deficiency anemia   . Myocardial infarction ([place])   . Polyp of sigmoid colon     Past Surgical History:  Procedure Laterality Date  . CARDIAC CATHETERIZATION    .  chest surgery for fungal infection    . COLONOSCOPY WITH PROPOFOL N/A 07/10/2017   Procedure: COLONOSCOPY WITH PROPOFOL;  Surgeon: [name], MD;  Location: [facility] ENDOSCOPY;  Service: Endoscopy;  Laterality: N/A;  . CORONARY ARTERY BYPASS GRAFT N/A 08/12/2019   Procedure: CORONARY ARTERY BYPASS GRAFTING (CABG)X 4;  Surgeon: [name], MD;  Location: [facility];  Service: Open Heart Surgery;  Laterality: N/A;  CABG x  4  using bilateral internal mammary arteries and endoscopically harvested left saphenous vein  . ESOPHAGOGASTRODUODENOSCOPY (EGD) WITH PROPOFOL N/A 07/10/2017   Procedure: ESOPHAGOGASTRODUODENOSCOPY (EGD) WITH PROPOFOL;  Surgeon: [name], MD;  Location: [facility] ENDOSCOPY;  Service: Endoscopy;  Laterality: N/A;  . HERNIA REPAIR    . LEFT HEART CATH AND CORONARY ANGIOGRAPHY Left 07/21/2019   Procedure: LEFT HEART CATH AND CORONARY ANGIOGRAPHY;  Surgeon: [name], MD;  Location:  [place] CV LAB;  Service: Cardiovascular;  Laterality: Left;  . TEE WITHOUT CARDIOVERSION N/A 08/12/2019   Procedure: TRANSESOPHAGEAL ECHOCARDIOGRAM (TEE);  Surgeon: [name], MD;  Location: [facility];  Service: Open Heart Surgery;  Laterality: N/A;    Family History  Problem Relation Age of Onset  . Hypertension Mother      Social History   Tobacco Use  Smoking Status Former Smoker  Smokeless Tobacco Former Systems developer  . Types: Chew  . Quit date: 11/27/1968  Tobacco Comment   used 2 packs per week; quit over 40 years ago    Social History   Substance and Sexual Activity  Alcohol Use Not Currently  . Alcohol/week: 0.0 standard drinks     Allergies  Allergen Reactions  . Simvastatin     Elevated CK  . Penicillins Rash    Did it involve swelling of the face/tongue/throat, SOB, or low BP? No Did it involve sudden or severe rash/hives, skin peeling, or any reaction on the inside of your mouth or nose? No Did you need to seek medical attention at a hospital or doctor's office?  No When did it last happen? If all above answers are "NO", may proceed with cephalosporin use.     Current Outpatient Medications  Medication Sig Dispense Refill  . acetaminophen (TYLENOL) 500 MG tablet Take 1,000 mg by mouth every 6 (six) hours as needed for moderate pain.    [name] aspirin 81 MG tablet Take 81 mg by mouth daily.     . clopidogrel (PLAVIX) 75 MG tablet Take 1 tablet (75 mg total) by mouth daily. 30 tablet 1  . isosorbide dinitrate (ISORDIL) 10 MG tablet Take 1 tablet (10 mg total) by mouth 3 (three) times daily. 90 tablet 1  . lisinopril (ZESTRIL) 10 MG tablet Take 1 tablet (10 mg total) by mouth daily. 30 tablet 1  . metFORMIN (GLUCOPHAGE) 500 MG tablet TAKE TWO TABLETS BY MOUTH TWICE DAILY WITH A MEAL 360 tablet 4  . metoprolol tartrate (LOPRESSOR) 25 MG tablet Take 1 tablet (25 mg total) by mouth every 8 (eight) hours. 90 tablet 1  . oxyCODONE-acetaminophen (PERCOCET) 10-325 MG tablet Take 1 tablet by mouth every 8 (eight) hours as needed for pain. 30 tablet 0  . rosuvastatin (CRESTOR) 20 MG tablet Take 1 tablet (20 mg total) by mouth daily at 6 PM. 30 tablet 1  . sildenafil (VIAGRA) 100 MG tablet Take 0.5-1 tablets (50-100 mg total) by mouth daily as needed for erectile dysfunction. 5 tablet 11  . amiodarone (PACERONE) 400 MG tablet Take 0.5 tablets (200 mg total) by mouth 2 (two) times daily. (Patient not taking: Reported on 09/22/2019) 60 tablet 1   No current facility-administered medications for this visit.     Review of Systems  Constitutional: Negative.   Respiratory: Negative for cough and shortness of breath.   Cardiovascular: Negative for chest pain, orthopnea and leg swelling.  Gastrointestinal: Negative.   Musculoskeletal: Positive for joint pain and myalgias.  Neurological: Negative.      PHYSICAL EXAMINATION: BP 138/85 (BP Location: Right Arm, Patient Position: Sitting, Cuff Size: Normal)   Pulse 72   Temp (!) 96.6 F (35.9 C)   Resp 16   Ht  6\' 1"  (1.854 m)   Wt 187 lb (84.8 kg)   SpO2 99% Comment: RA  BMI 24.67 kg/m  Physical Exam  Constitutional: He is oriented to person, place, and time. He appears well-developed and well-nourished. No distress.  HENT:  Head: Normocephalic and atraumatic.  Eyes: Conjunctivae and EOM are normal. No scleral icterus.  Neck: Normal range of motion. No tracheal deviation present.  Cardiovascular: Normal rate and regular rhythm.  Murmur heard. Respiratory: Effort normal and breath sounds normal. No respiratory distress.  GI: Soft. He exhibits no distension.  Musculoskeletal: Normal range of motion.  Neurological: He is alert and oriented to person, place, and time.  Skin: Skin is warm and dry. He is not diaphoretic.    Diagnostic Studies & Laboratory data:     Recent Radiology Findings:   Dg Sniff Test  Result Date: 09/10/2019 CLINICAL DATA:  Asymmetric elevation right hemidiaphragm. EXAM: CHEST FLUOROSCOPY TECHNIQUE: Real-time fluoroscopic evaluation of the chest was performed. FLUOROSCOPY TIME:  Fluoroscopy Time:  0 minutes and 54 seconds. Radiation Exposure Index (if provided by the fluoroscopic device): 81 mGy Number of Acquired Spot Images: COMPARISON:  Chest x-ray 08/22/2019 FINDINGS: With a sniff maneuver, decreased excursion of the right hemidiaphragm noted and on some sniffs, paradoxical elevation of the right hemidiaphragm is observed. IMPRESSION: Fluoroscopic imaging features compatible with paralysis of the right hemidiaphragm. Electronically Signed   By: Misty Stanley M.D.   On: 09/10/2019 10:24       I have independently reviewed the above radiology studies  and reviewed the findings with the patient.   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  He did have a RIMA used during his surgery.  He currently has minimal symptoms, and is very active.  I have recommended that he  start cardiac rehab, and return to clinic in 6 month for further evaluation.  I explained to him that surgery will only be required if he becomes more symptomatic and the diaphragm remains elevated.  Surgery will likely not be required if he is asymptomatic regardless of his CXR.     I  spent 55 minutes with  the patient face to face and greater then 50% of the time was spent in counseling and coordination of care.    Lajuana Matte 09/22/2019 3:47 PM

## 2019-09-23 ENCOUNTER — Other Ambulatory Visit: Payer: Self-pay | Admitting: *Deleted

## 2019-09-23 DIAGNOSIS — Z951 Presence of aortocoronary bypass graft: Secondary | ICD-10-CM

## 2019-09-29 ENCOUNTER — Other Ambulatory Visit: Payer: Self-pay | Admitting: Family Medicine

## 2019-09-29 DIAGNOSIS — M5416 Radiculopathy, lumbar region: Secondary | ICD-10-CM

## 2019-09-29 MED ORDER — OXYCODONE-ACETAMINOPHEN 10-325 MG PO TABS: 1.0000 | ORAL_TABLET | Freq: Three times a day (TID) | ORAL | 0 refills | Status: DC | PRN

## 2019-09-29 NOTE — Telephone Encounter (Signed)
Pt contacted office for refill request on the following medications:  oxyCODONE-acetaminophen (PERCOCET) 10-325 MG tablet   [facility] [place] Last Rx: 09/15/2019 LOV: 08/26/2019 Please advise. Thanks TNP

## 2019-10-03 IMAGING — DX DG CHEST 1V
1 series · 1 of 1 positions shown · non-contrast
Comparison: Chest x-rays dated 08/13/2019 and 08/12/2019.

CLINICAL DATA: History of open heart surgery.

EXAM:
CHEST  1 VIEW

[chest]
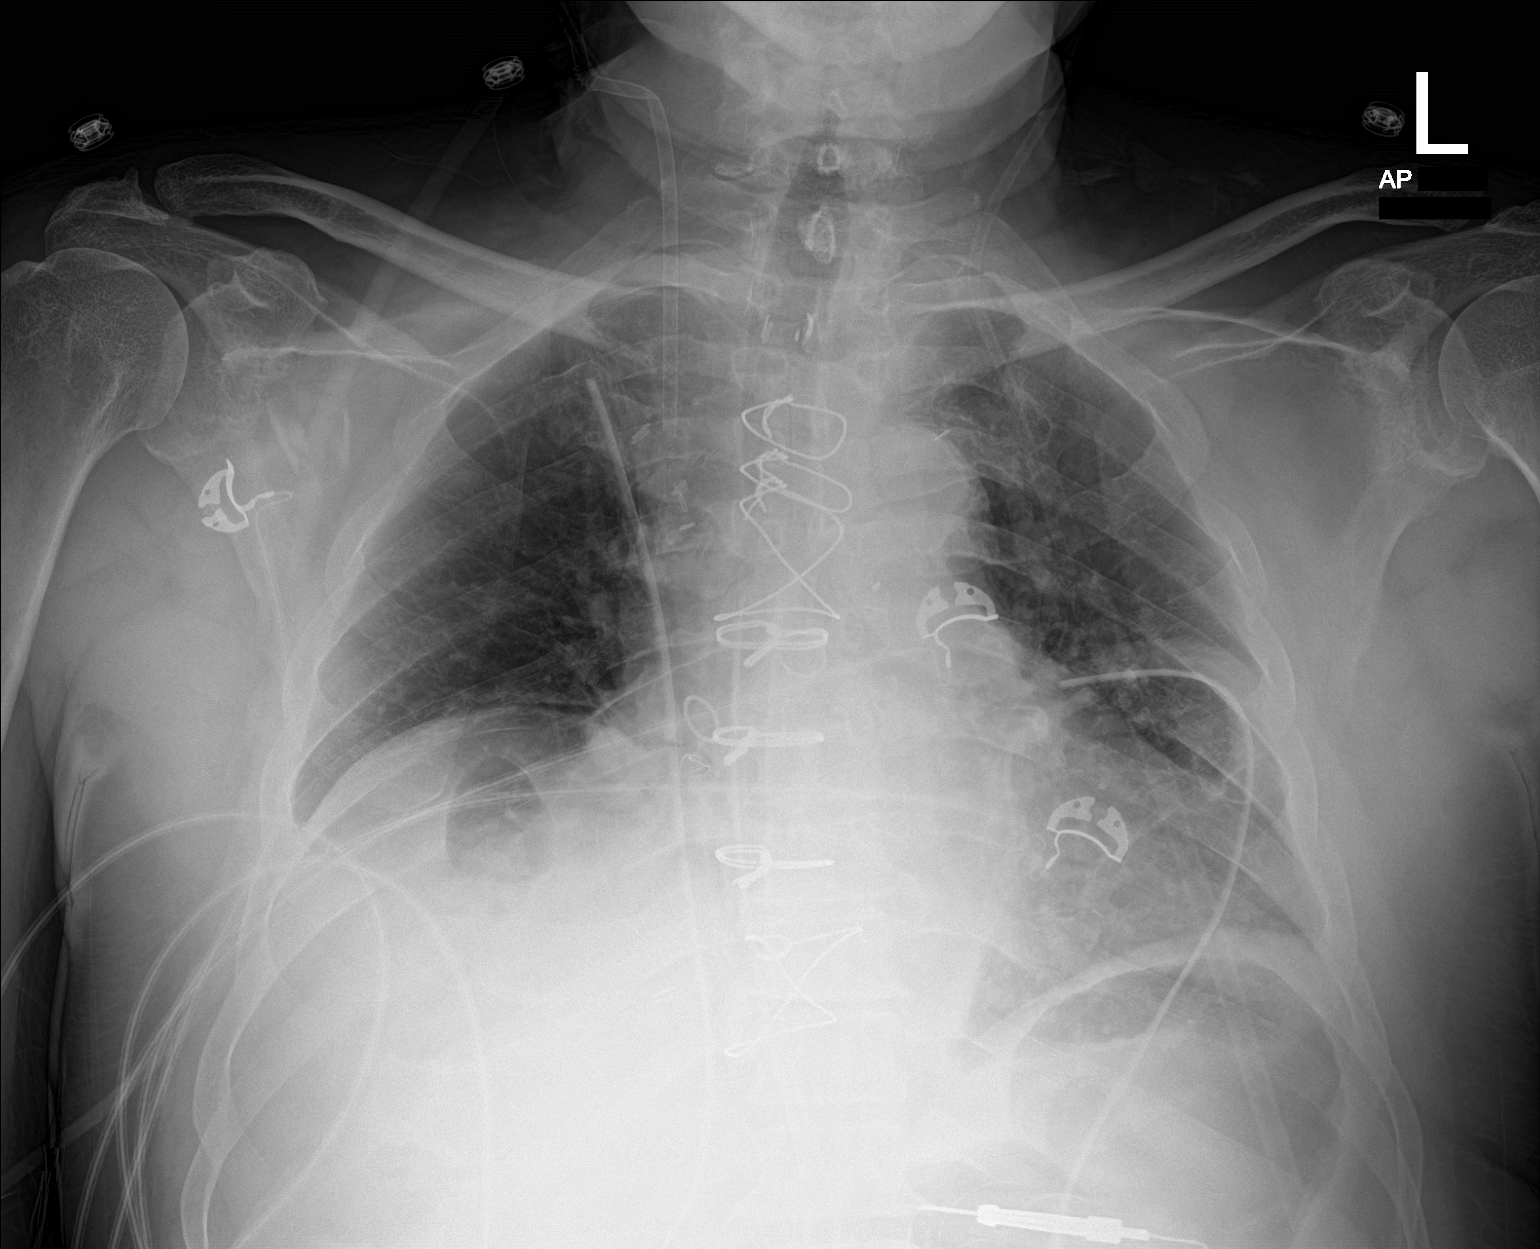

[1 of 1 positions shown; findings below may reference images not displayed]

FINDINGS: Stable cardiomegaly. Overall cardiomediastinal silhouette appears
stable in size and configuration. Lungs are clear. No pleural
effusion or pneumothorax seen.

Median sternotomy wires appear intact and stable in alignment.
Swan-Ganz catheter has been removed. RIGHT IJ sheath remains.
Bilateral chest tubes and mediastinal drains appear stable in
position. Stable elevation of the RIGHT hemidiaphragm.
IMPRESSION: 1. Interval removal of Swan-Ganz catheter. Remaining support
apparatus appears stable in position.
2. Lungs are clear and there is no evidence of acute cardiopulmonary
abnormality. Stable cardiomegaly.

## 2019-10-07 ENCOUNTER — Other Ambulatory Visit: Payer: Self-pay | Admitting: Family Medicine

## 2019-10-07 NOTE — Telephone Encounter (Signed)
Pt was prescribed the following medications when in hospital.  Pt needing his PCP to prescribe them as maintenance.  Pt is out.  clopidogrel (PLAVIX) 75 MG tablet 90 day  lisinopril (ZESTRIL) 10 MG tablet 90 day  metoprolol tartrate (LOPRESSOR) 25 MG tablet 90 day  Please call into:  [place] (N), [place] [phone] (Phone) [phone] (Fax)   Thanks, [employer]

## 2019-10-08 ENCOUNTER — Encounter: Payer: Medicare HMO | Attending: Internal Medicine | Admitting: *Deleted

## 2019-10-08 ENCOUNTER — Other Ambulatory Visit: Payer: Self-pay

## 2019-10-08 DIAGNOSIS — Z951 Presence of aortocoronary bypass graft: Secondary | ICD-10-CM

## 2019-10-08 MED ORDER — CLOPIDOGREL BISULFATE 75 MG PO TABS: 75.0000 mg | ORAL_TABLET | Freq: Every day | ORAL | 9 refills | Status: DC

## 2019-10-08 MED ORDER — LISINOPRIL 10 MG PO TABS: 10.0000 mg | ORAL_TABLET | Freq: Every day | ORAL | 4 refills | Status: DC

## 2019-10-08 MED ORDER — METOPROLOL TARTRATE 25 MG PO TABS: 25.0000 mg | ORAL_TABLET | Freq: Three times a day (TID) | ORAL | 9 refills | Status: DC

## 2019-10-08 NOTE — Progress Notes (Signed)
Virtual Orientation appt completed today.  Documentation fro diagnosis can be found in [facility] encounter 9/15.  EP eval scheduled for 11/18 at 130pm.

## 2019-10-08 NOTE — Telephone Encounter (Signed)
Pt called back asking about the 3 medications that pt was put on while in the hospital in September. Pt saw Dr. Caryn Section on 08/26/2019 for his f/u. Pt is requesting if he needs to continue the 3 medications and if so, could Dr. Caryn Section take over the medications and send new Rx to [facility]. Medications are list in message below. Please advise. Thanks TNP

## 2019-10-11 IMAGING — DX DG CHEST 2V
2 series · 3 of 3 positions shown · non-contrast
Comparison: August 14, 2019

CLINICAL DATA: Recent coronary artery bypass grafting

EXAM:
CHEST - 2 VIEW

[Series 1: dg chest 2 view · U · 0.14mm/px · 2 of 2 slices shown (1 of 2)]
[im 1/2]
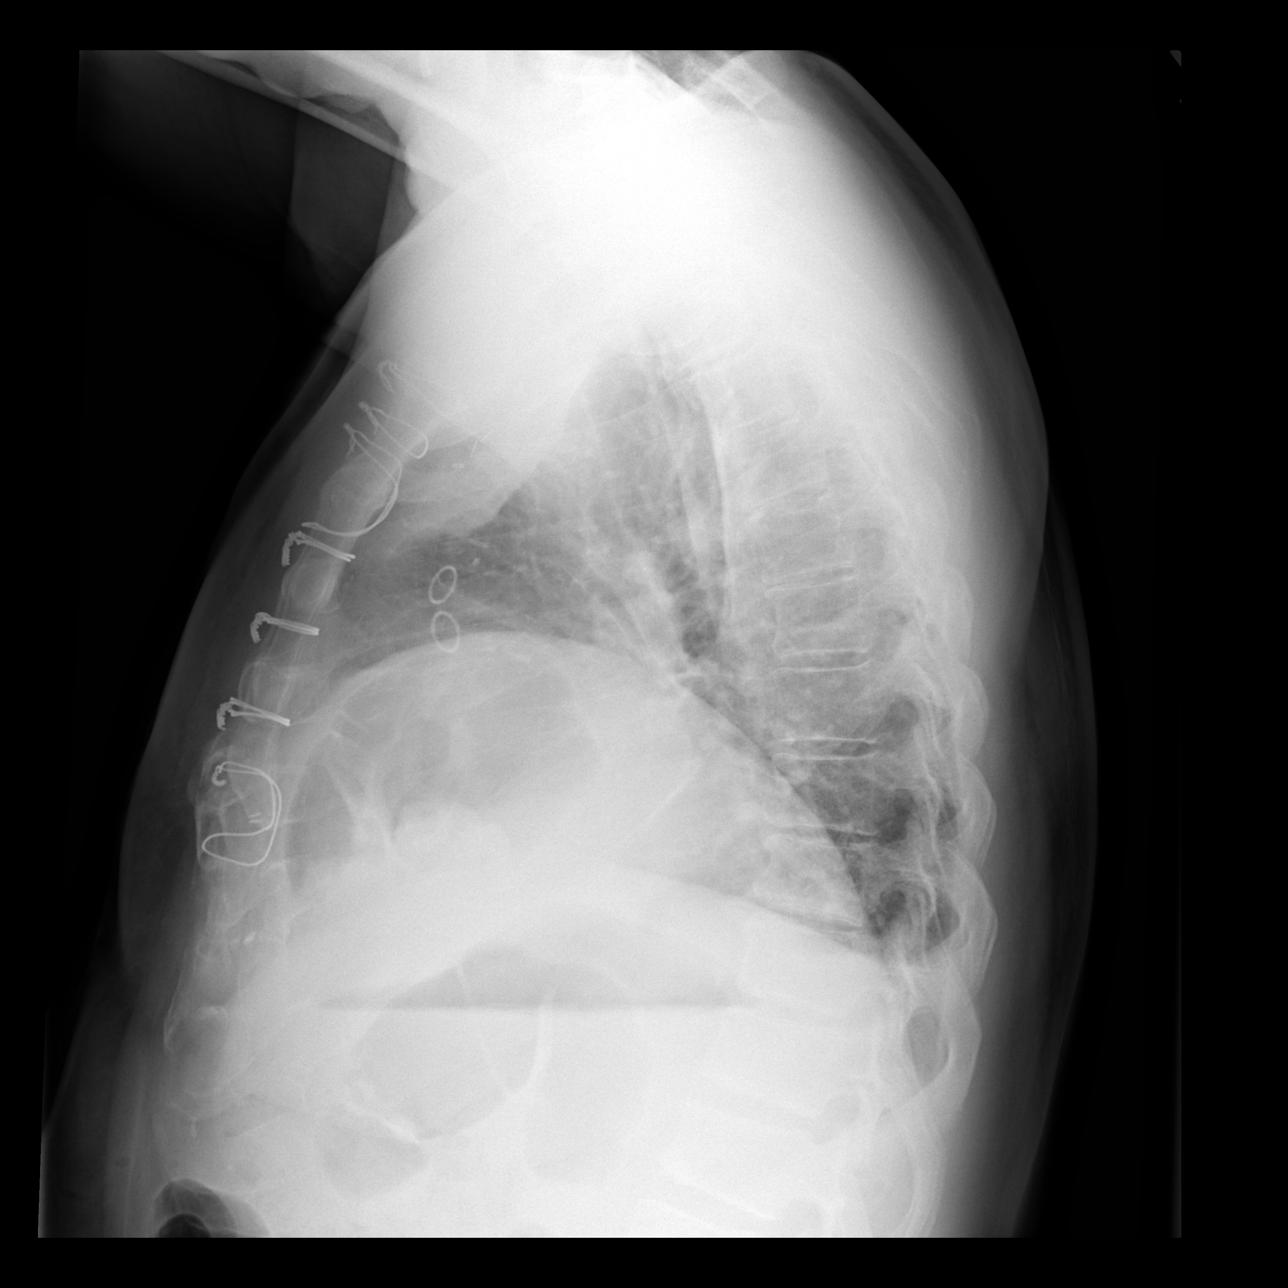
[im 2/2]
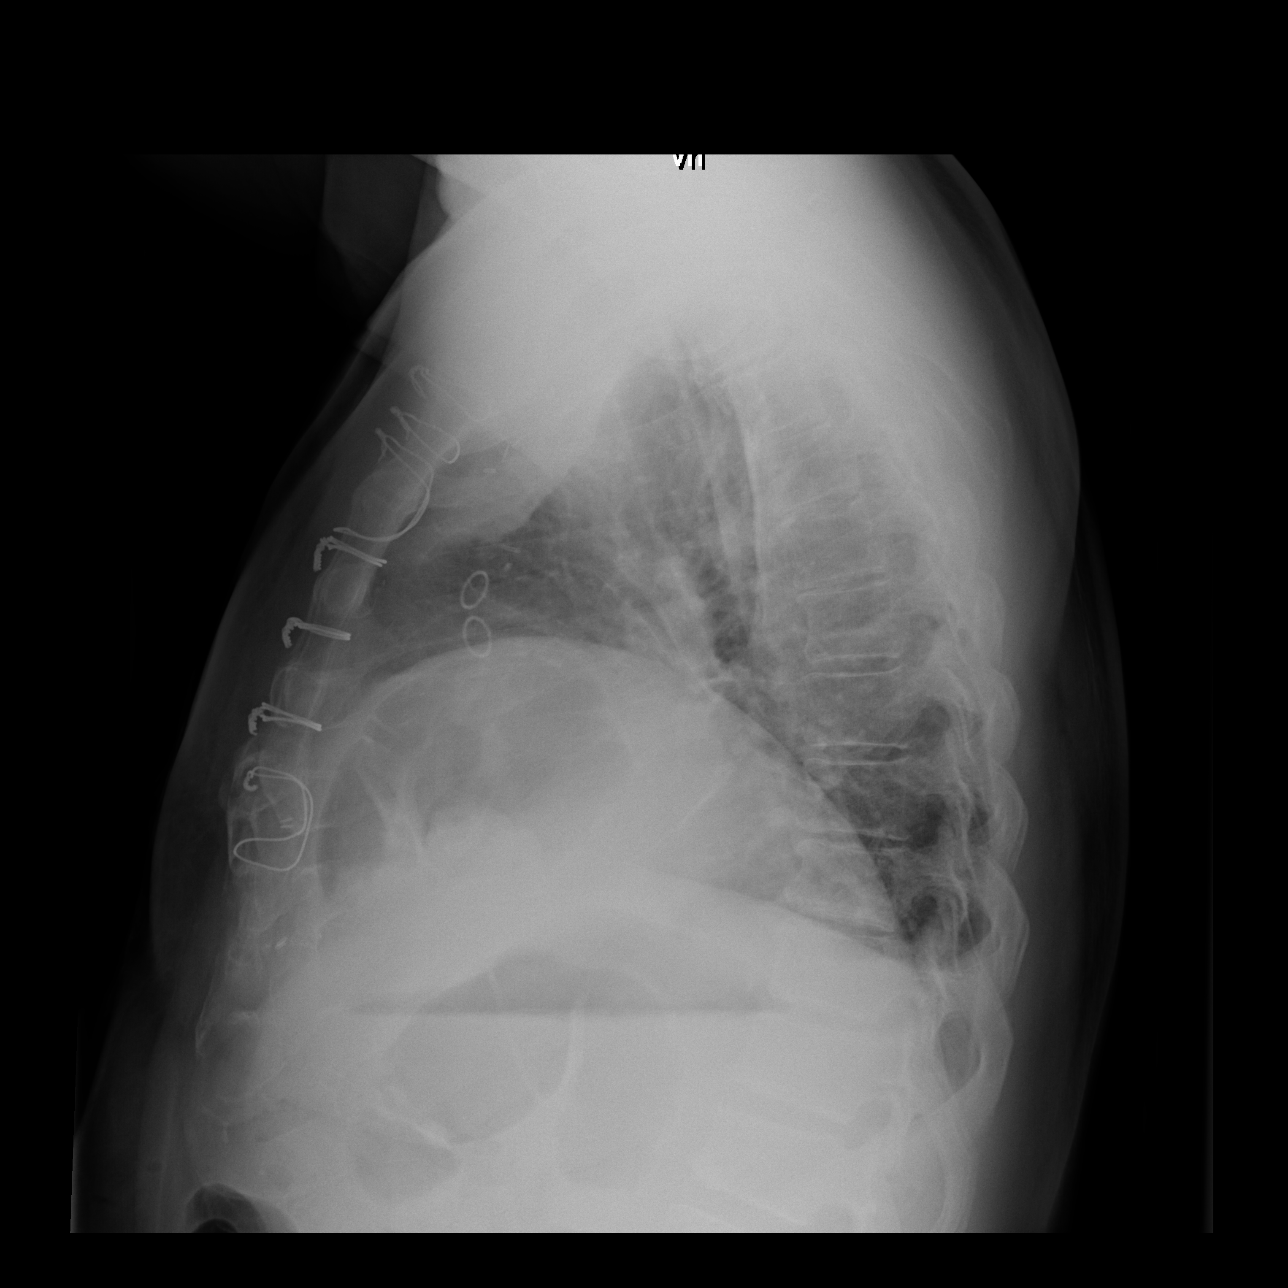

[dg chest 2 view (2 of 2)]
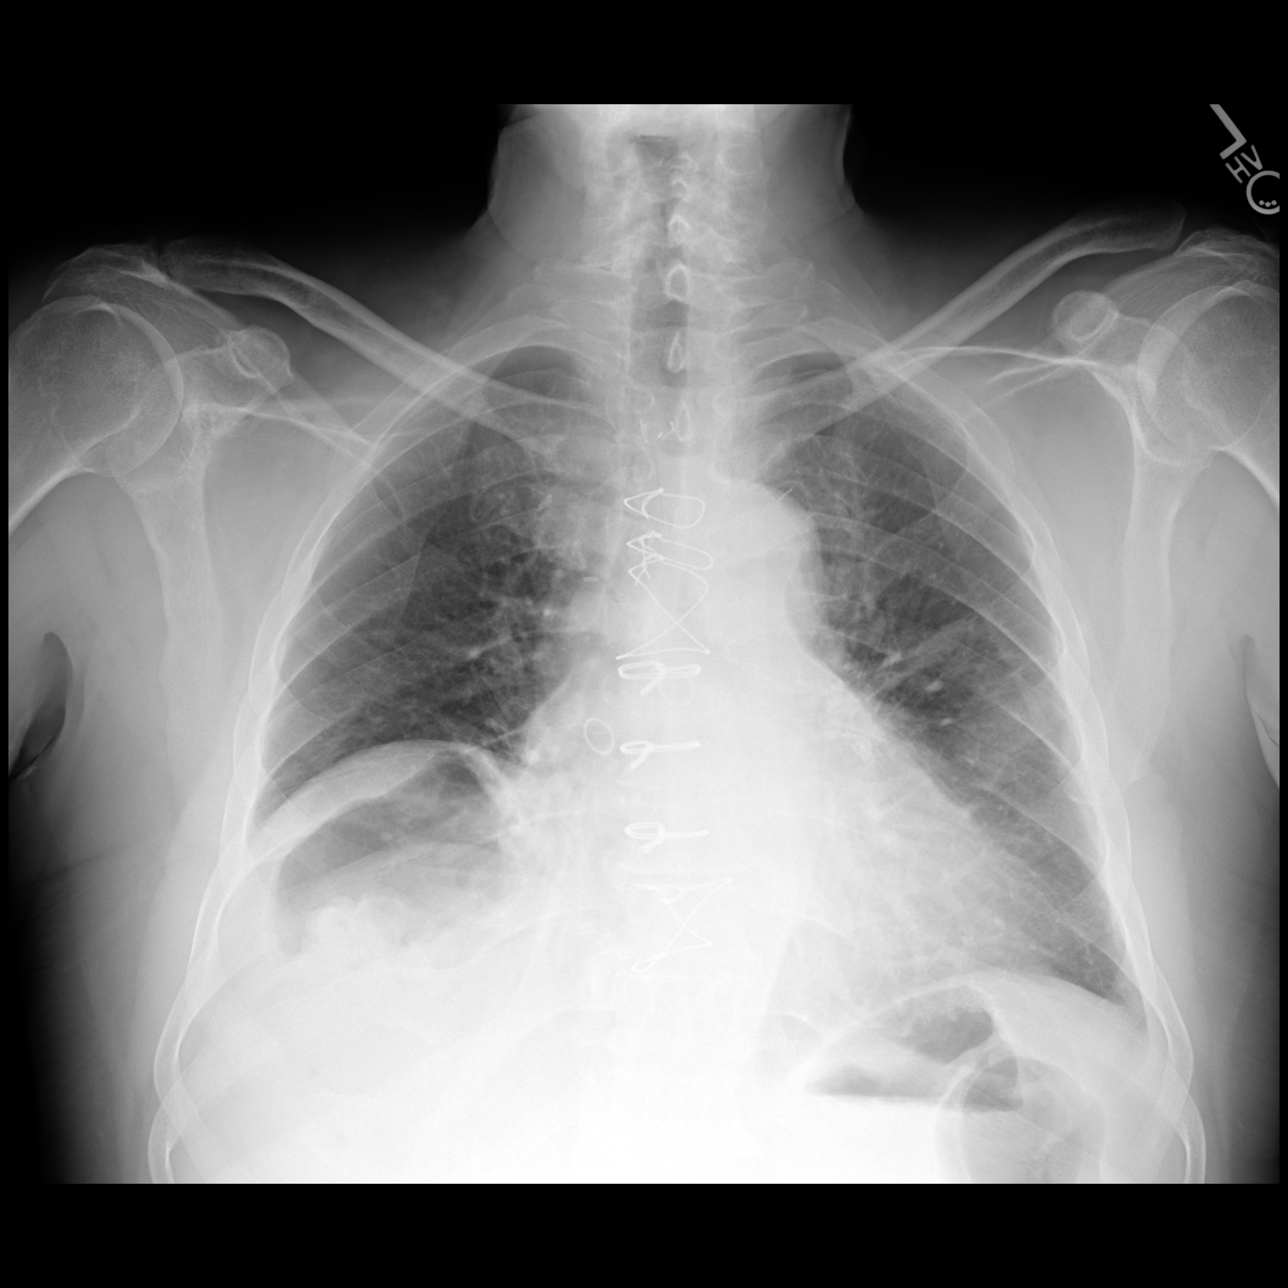

[3 of 3 positions shown; findings below may reference images not displayed]

FINDINGS: Interval removal of tubes and catheters. No pneumothorax. There is
stable elevation of the right hemidiaphragm. There is mild
atelectatic change in the left mid lung and right base. Lungs
elsewhere are clear. Heart size and pulmonary vascularity are
normal. Patient is status post coronary artery bypass grafting. No
evident adenopathy. There is mild degenerative change in the lower
thoracic spine.
IMPRESSION: No demonstrable pneumothorax. Stable elevation of right
hemidiaphragm. Atelectatic change left mid lung and right base. No
consolidation. Cardiac silhouette within normal limits.

## 2019-10-21 ENCOUNTER — Ambulatory Visit: Payer: Medicare HMO

## 2019-10-27 ENCOUNTER — Other Ambulatory Visit: Payer: Self-pay

## 2019-10-27 DIAGNOSIS — Z20822 Contact with and (suspected) exposure to covid-19: Secondary | ICD-10-CM

## 2019-10-28 ENCOUNTER — Other Ambulatory Visit: Payer: Self-pay | Admitting: Family Medicine

## 2019-10-28 DIAGNOSIS — M5416 Radiculopathy, lumbar region: Secondary | ICD-10-CM

## 2019-10-28 MED ORDER — OXYCODONE-ACETAMINOPHEN 10-325 MG PO TABS: 1.0000 | ORAL_TABLET | Freq: Three times a day (TID) | ORAL | 0 refills | Status: DC | PRN

## 2019-10-28 NOTE — Telephone Encounter (Signed)
LOV 09/16/2019 Last RF 09/29/2019

## 2019-10-28 NOTE — Telephone Encounter (Signed)
Pt is requesting a refill for  oxyCODONE-acetaminophen (PERCOCET) 10-325 MG tablet  metFORMIN (GLUCOPHAGE) 500 MG tablet     Pharmacy: [id number] - [place] ([place]), [place] [address]

## 2019-10-28 NOTE — Telephone Encounter (Signed)
From [facility] 

## 2019-10-29 ENCOUNTER — Telehealth: Payer: Self-pay | Admitting: *Deleted

## 2019-10-29 NOTE — Telephone Encounter (Signed)
Patient called for covid results ,still pending advised to call back.

## 2019-10-30 LAB — NOVEL CORONAVIRUS, NAA: SARS-CoV-2, NAA: NOT DETECTED

## 2019-11-03 ENCOUNTER — Ambulatory Visit (INDEPENDENT_AMBULATORY_CARE_PROVIDER_SITE_OTHER): Payer: Medicare HMO | Admitting: Family Medicine

## 2019-11-03 ENCOUNTER — Other Ambulatory Visit: Payer: Self-pay

## 2019-11-03 ENCOUNTER — Encounter: Payer: Medicare HMO | Admitting: Family Medicine

## 2019-11-03 DIAGNOSIS — R058 Other specified cough: Secondary | ICD-10-CM

## 2019-11-03 DIAGNOSIS — M7070 Other bursitis of hip, unspecified hip: Secondary | ICD-10-CM

## 2019-11-03 DIAGNOSIS — R05 Cough: Secondary | ICD-10-CM

## 2019-11-03 MED ORDER — PREDNISONE 10 MG PO TABS: ORAL_TABLET | ORAL | 0 refills | Status: AC

## 2019-11-03 NOTE — Progress Notes (Signed)
Patient: Craig York Male    DOB: 03/19/1951   68 y.o.   MRN: [id number] Visit Date: 11/03/2019  Today's Provider: Lelon Huh, MD   No chief complaint on file.  Subjective:    Virtual Visit via Telephone Note  I connected with Craig York on 11/09/19 at  2:40 PM EST by telephone and verified that I am speaking with the correct person using two identifiers.  Location: Patient: Home Provider: [facility]   I discussed the limitations, risks, security and privacy concerns of performing an evaluation and management service by telephone and the availability of in person appointments. I also discussed with the patient that there may be a patient responsible charge related to this service. The patient expressed understanding and agreed to proceed.   Cough The current episode started in the past 7 days. The cough is non-productive. Associated symptoms include postnasal drip and shortness of breath. Pertinent negatives include no fever, nasal congestion or sore throat.  Hip Pain  The pain is present in the right hip. The quality of the pain is described as shooting and aching. The pain is mild. The pain has been fluctuating since onset.   Covid tested 10/27/2019 and it was negative.  Cough and runny nose for a week.   Allergies  Allergen Reactions  . Simvastatin     Elevated CK  . Penicillins Rash    Did it involve swelling of the face/tongue/throat, SOB, or low BP? No Did it involve sudden or severe rash/hives, skin peeling, or any reaction on the inside of your mouth or nose? No Did you need to seek medical attention at a hospital or doctor's office? No When did it last happen? If all above answers are "NO", may proceed with cephalosporin use.      Current Outpatient Medications:  .  acetaminophen (TYLENOL) 500 MG tablet, Take 1,000 mg by mouth every 6 (six) hours as needed for moderate pain., Disp: , Rfl:  .  amiodarone (PACERONE) 200  MG tablet, Take 200 mg by mouth 2 (two) times daily., Disp: , Rfl:  .  aspirin 81 MG tablet, Take 81 mg by mouth daily. , Disp: , Rfl:  .  clopidogrel (PLAVIX) 75 MG tablet, Take 1 tablet (75 mg total) by mouth daily., Disp: 90 tablet, Rfl: 9 .  isosorbide dinitrate (ISORDIL) 10 MG tablet, Take 1 tablet (10 mg total) by mouth 3 (three) times daily., Disp: 90 tablet, Rfl: 1 .  lisinopril (ZESTRIL) 10 MG tablet, Take 1 tablet (10 mg total) by mouth daily., Disp: 90 tablet, Rfl: 4 .  metFORMIN (GLUCOPHAGE) 500 MG tablet, TAKE TWO TABLETS BY MOUTH TWICE DAILY WITH A MEAL, Disp: 360 tablet, Rfl: 4 .  metoprolol tartrate (LOPRESSOR) 25 MG tablet, Take 1 tablet (25 mg total) by mouth every 8 (eight) hours., Disp: 90 tablet, Rfl: 9 .  oxyCODONE-acetaminophen (PERCOCET) 10-325 MG tablet, Take 1 tablet by mouth every 8 (eight) hours as needed for pain., Disp: 30 tablet, Rfl: 0 .  rosuvastatin (CRESTOR) 20 MG tablet, Take 1 tablet (20 mg total) by mouth daily at 6 PM., Disp: 30 tablet, Rfl: 1 .  sildenafil (VIAGRA) 100 MG tablet, Take 0.5-1 tablets (50-100 mg total) by mouth daily as needed for erectile dysfunction., Disp: 5 tablet, Rfl: 11  Review of Systems  Constitutional: Negative for fever.  HENT: Positive for postnasal drip. Negative for sore throat.   Respiratory: Positive for cough and shortness of breath.  Social History   Tobacco Use  . Smoking status: Former Research scientist (life sciences)  . Smokeless tobacco: Former Systems developer    Types: Chew    Quit date: 11/27/1968  . Tobacco comment: used 2 packs per week; quit over 40 years ago  Substance Use Topics  . Alcohol use: Not Currently    Alcohol/week: 0.0 standard drinks      Objective:   There were no vitals taken for this visit.   Physical Exam  Awake, alert, oriented x 3. No indications of respiratory distress.  No results found for any visits on 11/03/19.     Assessment & Plan     1. Bursitis of hip, unspecified bursa, unspecified laterality  -  predniSONE (DELTASONE) 10 MG tablet; 6 tablets for 1 day, then 5 for 1 day, then 4 for 1 day, then 3 for 1 day, then 2 for 1 day then 1 for 1 day.  Dispense: 21 tablet; Refill: 0  2. Post-viral cough syndrome  - predniSONE (DELTASONE) 10 MG tablet; 6 tablets for 1 day, then 5 for 1 day, then 4 for 1 day, then 3 for 1 day, then 2 for 1 day then 1 for 1 day.  Dispense: 21 tablet; Refill: 0     I discussed the assessment and treatment plan with the patient. The patient was provided an opportunity to ask questions and all were answered. The patient agreed with the plan and demonstrated an understanding of the instructions.   The patient was advised to call back or seek an in-person evaluation if the symptoms worsen or if the condition fails to improve as anticipated.  I provided 12 minutes of non-face-to-face time during this encounter.      Lelon Huh, MD  [facility]

## 2019-11-03 NOTE — Progress Notes (Signed)
Patient: Craig York Male    DOB: [birth year]   68 y.o.   MRN: [id number] Visit Date: 11/03/2019  Today's Provider: Wilhemena Durie, MD   No chief complaint on file.  Subjective:     Cough The current episode started in the past 7 days. The cough is non-productive. Associated symptoms include postnasal drip and shortness of breath. Pertinent negatives include no fever, nasal congestion or sore throat.  Hip Pain  The pain is present in the right hip. The quality of the pain is described as shooting and aching. The pain is mild. The pain has been fluctuating since onset.   Covid tested 10/27/2019 and it was negative.   Allergies  Allergen Reactions  . Simvastatin     Elevated CK  . Penicillins Rash    Did it involve swelling of the face/tongue/throat, SOB, or low BP? No Did it involve sudden or severe rash/hives, skin peeling, or any reaction on the inside of your mouth or nose? No Did you need to seek medical attention at a hospital or doctor's office? No When did it last happen? If all above answers are "NO", may proceed with cephalosporin use.      Current Outpatient Medications:  .  acetaminophen (TYLENOL) 500 MG tablet, Take 1,000 mg by mouth every 6 (six) hours as needed for moderate pain., Disp: , Rfl:  .  amiodarone (PACERONE) 400 MG tablet, Take 0.5 tablets (200 mg total) by mouth 2 (two) times daily. (Patient not taking: Reported on 09/22/2019), Disp: 60 tablet, Rfl: 1 .  aspirin 81 MG tablet, Take 81 mg by mouth daily. , Disp: , Rfl:  .  clopidogrel (PLAVIX) 75 MG tablet, Take 1 tablet (75 mg total) by mouth daily., Disp: 90 tablet, Rfl: 9 .  isosorbide dinitrate (ISORDIL) 10 MG tablet, Take 1 tablet (10 mg total) by mouth 3 (three) times daily., Disp: 90 tablet, Rfl: 1 .  lisinopril (ZESTRIL) 10 MG tablet, Take 1 tablet (10 mg total) by mouth daily., Disp: 90 tablet, Rfl: 4 .  metFORMIN (GLUCOPHAGE) 500 MG tablet, TAKE TWO TABLETS BY MOUTH TWICE  DAILY WITH A MEAL, Disp: 360 tablet, Rfl: 4 .  metoprolol tartrate (LOPRESSOR) 25 MG tablet, Take 1 tablet (25 mg total) by mouth every 8 (eight) hours., Disp: 90 tablet, Rfl: 9 .  oxyCODONE-acetaminophen (PERCOCET) 10-325 MG tablet, Take 1 tablet by mouth every 8 (eight) hours as needed for pain., Disp: 30 tablet, Rfl: 0 .  rosuvastatin (CRESTOR) 20 MG tablet, Take 1 tablet (20 mg total) by mouth daily at 6 PM., Disp: 30 tablet, Rfl: 1 .  sildenafil (VIAGRA) 100 MG tablet, Take 0.5-1 tablets (50-100 mg total) by mouth daily as needed for erectile dysfunction., Disp: 5 tablet, Rfl: 11  Review of Systems  Constitutional: Negative for fever.  HENT: Positive for postnasal drip. Negative for sore throat.   Respiratory: Positive for cough and shortness of breath.     Social History   Tobacco Use  . Smoking status: Former Research scientist (life sciences)  . Smokeless tobacco: Former Systems developer    Types: Chew    Quit date: 11/27/1968  . Tobacco comment: used 2 packs per week; quit over 40 years ago  Substance Use Topics  . Alcohol use: Not Currently    Alcohol/week: 0.0 standard drinks      Objective:   There were no vitals taken for this visit. There were no vitals filed for this visit.There is no height or weight  on file to calculate BMI.   Physical Exam   No results found for any visits on 11/03/19.     Assessment & Plan        Wilhemena Durie, MD  [facility]

## 2019-11-07 ENCOUNTER — Emergency Department
Admission: EM | Admit: 2019-11-07 | Discharge: 2019-11-07 | Disposition: A | Discharge status: Home / Self Care | Payer: Medicare HMO | Attending: Emergency Medicine | Admitting: Emergency Medicine

## 2019-11-07 ENCOUNTER — Ambulatory Visit: Payer: Self-pay | Admitting: *Deleted

## 2019-11-07 ENCOUNTER — Emergency Department: Payer: Medicare HMO

## 2019-11-07 ENCOUNTER — Other Ambulatory Visit: Payer: Self-pay

## 2019-11-07 ENCOUNTER — Ambulatory Visit (INDEPENDENT_AMBULATORY_CARE_PROVIDER_SITE_OTHER): Payer: Medicare HMO | Admitting: Physician Assistant

## 2019-11-07 ENCOUNTER — Encounter: Payer: Self-pay | Admitting: Intensive Care

## 2019-11-07 ENCOUNTER — Encounter: Payer: Self-pay | Admitting: Physician Assistant

## 2019-11-07 DIAGNOSIS — Z7982 Long term (current) use of aspirin: Secondary | ICD-10-CM | POA: Insufficient documentation

## 2019-11-07 DIAGNOSIS — Z7984 Long term (current) use of oral hypoglycemic drugs: Secondary | ICD-10-CM | POA: Insufficient documentation

## 2019-11-07 DIAGNOSIS — E119 Type 2 diabetes mellitus without complications: Secondary | ICD-10-CM | POA: Diagnosis not present

## 2019-11-07 DIAGNOSIS — Z951 Presence of aortocoronary bypass graft: Secondary | ICD-10-CM | POA: Insufficient documentation

## 2019-11-07 DIAGNOSIS — R0602 Shortness of breath: Secondary | ICD-10-CM

## 2019-11-07 DIAGNOSIS — Z8616 Personal history of COVID-19: Secondary | ICD-10-CM | POA: Insufficient documentation

## 2019-11-07 DIAGNOSIS — U071 COVID-19: Secondary | ICD-10-CM | POA: Diagnosis not present

## 2019-11-07 DIAGNOSIS — Z7902 Long term (current) use of antithrombotics/antiplatelets: Secondary | ICD-10-CM | POA: Diagnosis not present

## 2019-11-07 DIAGNOSIS — I252 Old myocardial infarction: Secondary | ICD-10-CM | POA: Insufficient documentation

## 2019-11-07 DIAGNOSIS — I10 Essential (primary) hypertension: Secondary | ICD-10-CM | POA: Insufficient documentation

## 2019-11-07 DIAGNOSIS — Z79899 Other long term (current) drug therapy: Secondary | ICD-10-CM | POA: Insufficient documentation

## 2019-11-07 DIAGNOSIS — R05 Cough: Secondary | ICD-10-CM | POA: Diagnosis not present

## 2019-11-07 DIAGNOSIS — Z20822 Contact with and (suspected) exposure to covid-19: Secondary | ICD-10-CM

## 2019-11-07 DIAGNOSIS — I2510 Atherosclerotic heart disease of native coronary artery without angina pectoris: Secondary | ICD-10-CM | POA: Insufficient documentation

## 2019-11-07 DIAGNOSIS — Z87891 Personal history of nicotine dependence: Secondary | ICD-10-CM | POA: Insufficient documentation

## 2019-11-07 NOTE — Telephone Encounter (Signed)
Patient reports he has been dizzy spells for about 1 week. Patient has been taking Prednisone for hip pain and is not sleeping at night. Patient states he has to check his BP at drug store- he dies not have a Cuff at home- but states it was up at last trip to pharmacy- 189/90. Unknown what it is now. Some coughing present. Patient was checked COVID - negative 11/30.  Appointment for virtual visit scheduled for patient- but he has flip phone and may not be able to do virtual. Reason for Disposition . [1] MILD dizziness (e.g., walking normally) AND [2] has NOT been evaluated by physician for this  (Exception: dizziness caused by heat exposure, sudden standing, or poor fluid intake)  Answer Assessment - Initial Assessment Questions 1. DESCRIPTION: "Describe your dizziness."     Patient is having trouble sleeping at night- he is short winded when he gets up fast 2. LIGHTHEADED: "Do you feel lightheaded?" (e.g., somewhat faint, woozy, weak upon standing)     Patient states he gets swimmy headed when he gets up to go to the bathroom during the night, happened yesterday during the day 3. VERTIGO: "Do you feel like either you or the room is spinning or tilting?" (i.e. vertigo)     no 4. SEVERITY: "How bad is it?"  "Do you feel like you are going to faint?" "Can you stand and walk?"   - MILD - walking normally   - MODERATE - interferes with normal activities (e.g., work, school)    - SEVERE - unable to stand, requires support to walk, feels like passing out now.      mild 5. ONSET:  "When did the dizziness begin?"     1 week 6. AGGRAVATING FACTORS: "Does anything make it worse?" (e.g., standing, change in head position)     Changing positions- laying /down, sitting to standing 7. HEART RATE: "Can you tell me your heart rate?" "How many beats in 15 seconds?"  (Note: not all patients can do this)       Patient does not feel a difference 8. CAUSE: "What do you think is causing the dizziness?"  Prednisone possible- 12/7 started 9. RECURRENT SYMPTOM: "Have you had dizziness before?" If so, ask: "When was the last time?" "What happened that time?"     no 10. OTHER SYMPTOMS: "Do you have any other symptoms?" (e.g., fever, chest pain, vomiting, diarrhea, bleeding)       no 11. PREGNANCY: "Is there any chance you are pregnant?" "When was your last menstrual period?"       n/a  Protocols used: DIZZINESS [facility]

## 2019-11-07 NOTE — ED Notes (Signed)
ED Provider at bedside.  Pt reports open heart surgery on 9/15 and poor activity tolerance since and dizziness upon rapid standing, cough since 7 Dec (PCP ordered prednisolone), pt c/o left upper abdominal pain  Pt reports negative COVID test on 11/30  Pt denies fever/N/V/D

## 2019-11-07 NOTE — Progress Notes (Signed)
Patient: Craig York Male    DOB: [birth year]   68 y.o.   MRN: [id number] Visit Date: 11/07/2019  Today's Provider: Trinna Post, PA-C   Chief Complaint  Patient presents with  . Shortness of Breath   Subjective:    Virtual Visit via Telephone Note  I connected with Craig York on 11/07/19 at  3:20 PM EST by telephone and verified that I am speaking with the correct person using two identifiers.  Location: Patient: Home Provider: Office    I discussed the limitations, risks, security and privacy concerns of performing an evaluation and management service by telephone and the availability of in person appointments. I also discussed with the patient that there may be a patient responsible charge related to this service. The patient expressed understanding and agreed to proceed.  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 This was present after his surgery but he feels this has worsened and he is having difficulty breathing with movement. He reports breathing is easier to lie flat. Denying ankle swelling or belly swelling. Current weight is 185 lbs, down from 200 pounds last year. No dizziness and no syncope. Currently taking prednisone for hip pain. He denies COVID contacts. He denies chest pain. He denies nausea or vomiting. He most recently had a negative COVID test on 10/27/2019.   Allergies  Allergen Reactions  . Simvastatin     Elevated CK  . Penicillins Rash    Did it involve swelling of the face/tongue/throat, SOB, or low BP? No Did it involve sudden or severe rash/hives, skin peeling, or any reaction on the inside of your mouth or nose? No Did you need to seek medical attention at a hospital or doctor's office? No When did it last happen? If all above answers are "NO", may proceed with cephalosporin use.      Current  Outpatient Medications:  .  acetaminophen (TYLENOL) 500 MG tablet, Take 1,000 mg by mouth every 6 (six) hours as needed for moderate pain., Disp: , Rfl:  .  amiodarone (PACERONE) 200 MG tablet, Take 200 mg by mouth 2 (two) times daily., Disp: , Rfl:  .  aspirin 81 MG tablet, Take 81 mg by mouth daily. , Disp: , Rfl:  .  clopidogrel (PLAVIX) 75 MG tablet, Take 1 tablet (75 mg total) by mouth daily., Disp: 90 tablet, Rfl: 9 .  isosorbide dinitrate (ISORDIL) 10 MG tablet, Take 1 tablet (10 mg total) by mouth 3 (three) times daily., Disp: 90 tablet, Rfl: 1 .  lisinopril (ZESTRIL) 10 MG tablet, Take 1 tablet (10 mg total) by mouth daily., Disp: 90 tablet, Rfl: 4 .  metFORMIN (GLUCOPHAGE) 500 MG tablet, TAKE TWO TABLETS BY MOUTH TWICE DAILY WITH A MEAL, Disp: 360 tablet, Rfl: 4 .  metoprolol tartrate (LOPRESSOR) 25 MG tablet, Take 1 tablet (25 mg total) by mouth every 8 (eight) hours., Disp: 90 tablet, Rfl: 9 .  oxyCODONE-acetaminophen (PERCOCET) 10-325 MG tablet, Take 1 tablet by mouth every 8 (eight) hours as needed for pain., Disp: 30 tablet, Rfl: 0 .  predniSONE (DELTASONE) 10 MG tablet, 6 tablets for 1 day, then 5 for 1 day, then 4 for 1 day, then 3 for 1 day, then 2 for 1 day then 1 for 1 day., Disp: 21 tablet, Rfl: 0 .  rosuvastatin (CRESTOR) 20  MG tablet, Take 1 tablet (20 mg total) by mouth daily at 6 PM., Disp: 30 tablet, Rfl: 1 .  sildenafil (VIAGRA) 100 MG tablet, Take 0.5-1 tablets (50-100 mg total) by mouth daily as needed for erectile dysfunction., Disp: 5 tablet, Rfl: 11  Review of Systems  Constitutional: Negative for appetite change, chills and fever.  Respiratory: Negative for chest tightness, shortness of breath and wheezing.   Cardiovascular: Negative for chest pain and palpitations.  Gastrointestinal: Negative for abdominal pain, nausea and vomiting.  Neurological: Positive for dizziness.    Social History   Tobacco Use  . Smoking status: Former Research scientist (life sciences)  . Smokeless tobacco:  Former Systems developer    Types: Chew    Quit date: 11/27/1968  . Tobacco comment: used 2 packs per week; quit over 40 years ago  Substance Use Topics  . Alcohol use: Not Currently    Alcohol/week: 0.0 standard drinks      Objective:   There were no vitals taken for this visit. There were no vitals filed for this visit.There is no height or weight on file to calculate BMI.   Physical Exam   No results found for any visits on 11/07/19.     Assessment & Plan    1. Shortness of breath  Patient is a type II diabetic who is S/P CABG x 4 in 07/2019 who reports worsening shortness over the past few days. He is currently taking a prednisone taper. The differential is broad: arrhythmia, MI, dissection PE, COVID, pneumonia. I have advised him he will need to be seen in the ER and he is agreeable. [facility] will go to [facility]. I have called ahead.   2. S/P CABG x 4   I discussed the assessment and treatment plan with the patient. The patient was provided an opportunity to ask questions and all were answered. The patient agreed with the plan and demonstrated an understanding of the instructions.   The patient was advised to call back or seek an in-person evaluation if the symptoms worsen or if the condition fails to improve as anticipated.  I provided 15 minutes of non-face-to-face time during this encounter.     Trinna Post, PA-C  [facility]

## 2019-11-07 NOTE — ED Provider Notes (Signed)
[facility] Emergency Department Provider Note   ____________________________________________    I have reviewed the triage vital signs and the nursing notes.   HISTORY  Chief Complaint Cough     HPI Craig York is a 68 y.o. male with a history of coronary artery disease, diabetes, status post CABG several months ago who presents with a dry cough.  Patient reports he has been coughing for approximately 3 to 4 days.  He denies shortness of breath.  He notes that he had a Covid test on 30 November which was negative however his wife tested positive.  He does not believe that he has had fevers.  No nausea or vomiting.  Some myalgias.  Some fatigue.  Occasional dizziness.  No chest pain  Past Medical History:  Diagnosis Date  . Anginal pain ([place])   . Benign neoplasm of descending colon   . BPH (benign prostatic hyperplasia)   . Coronary artery disease   . Diabetes mellitus without complication ([place])   . Hemorrhage of gastrointestinal tract 07/23/2008  . Hyperlipidemia   . Hypertension   . Iron deficiency anemia   . Myocardial infarction ([place])   . Polyp of sigmoid colon     Patient Active Problem List   Diagnosis Date Noted  . S/P CABG x 4 08/12/2019  . History of adenomatous polyp of colon 07/05/2019  . Gastric polyp   . Iron (Fe) deficiency anemia 04/19/2017  . Fracture of metatarsal bone, right sequela 06/04/2015  . H/O acute myocardial infarction 06/04/2015  . Lumbar radiculopathy 06/04/2015  . Sciatica 02/22/2009  . Diabetes mellitus, type 2 ([place]) 01/07/2009  . Lumbosacral spondylosis 12/06/2008  . ED (erectile dysfunction) of organic origin 10/18/2008  . Coronary artery disease 10/16/2008  . HLD (hyperlipidemia) 07/23/2008  . Essential (primary) hypertension 07/23/2008    Past Surgical History:  Procedure Laterality Date  . CARDIAC CATHETERIZATION    . chest surgery for fungal infection    . COLONOSCOPY WITH PROPOFOL N/A  07/10/2017   Procedure: COLONOSCOPY WITH PROPOFOL;  Surgeon: [name], MD;  Location: [facility] ENDOSCOPY;  Service: Endoscopy;  Laterality: N/A;  . CORONARY ARTERY BYPASS GRAFT N/A 08/12/2019   Procedure: CORONARY ARTERY BYPASS GRAFTING (CABG)X 4;  Surgeon: [name], MD;  Location: [place];  Service: Open Heart Surgery;  Laterality: N/A;  CABG x  4  using bilateral internal mammary arteries and endoscopically harvested left saphenous vein  . ESOPHAGOGASTRODUODENOSCOPY (EGD) WITH PROPOFOL N/A 07/10/2017   Procedure: ESOPHAGOGASTRODUODENOSCOPY (EGD) WITH PROPOFOL;  Surgeon: [name], MD;  Location: [facility] ENDOSCOPY;  Service: Endoscopy;  Laterality: N/A;  . HERNIA REPAIR    . LEFT HEART CATH AND CORONARY ANGIOGRAPHY Left 07/21/2019   Procedure: LEFT HEART CATH AND CORONARY ANGIOGRAPHY;  Surgeon: [name], MD;  Location: [facility] CV LAB;  Service: Cardiovascular;  Laterality: Left;  . TEE WITHOUT CARDIOVERSION N/A 08/12/2019   Procedure: TRANSESOPHAGEAL ECHOCARDIOGRAM (TEE);  Surgeon: [name], MD;  Location: [place];  Service: Open Heart Surgery;  Laterality: N/A;    Prior to Admission medications   Medication Sig Start Date End Date Taking? Authorizing Provider  acetaminophen (TYLENOL) 500 MG tablet Take 1,000 mg by mouth every 6 (six) hours as needed for moderate pain.    [provider]  amiodarone (PACERONE) 200 MG tablet Take 200 mg by mouth 2 (two) times daily. 08/17/19   [provider]  aspirin 81 MG tablet Take 81 mg by mouth daily.  07/23/08   [provider]  clopidogrel (PLAVIX) 75 MG tablet Take 1 tablet (75 mg total) by mouth daily. 10/08/19   [name], MD  isosorbide dinitrate (ISORDIL) 10 MG tablet Take 1 tablet (10 mg total) by mouth 3 (three) times daily. 08/17/19   Elgie Collard, PA-C  lisinopril (ZESTRIL) 10 MG tablet Take 1 tablet (10 mg total) by mouth daily. 10/08/19   [name], MD  metFORMIN (GLUCOPHAGE) 500  MG tablet TAKE TWO TABLETS BY MOUTH TWICE DAILY WITH A MEAL 09/01/19   [name], MD  metoprolol tartrate (LOPRESSOR) 25 MG tablet Take 1 tablet (25 mg total) by mouth every 8 (eight) hours. 10/08/19   [name], MD  oxyCODONE-acetaminophen (PERCOCET) 10-325 MG tablet Take 1 tablet by mouth every 8 (eight) hours as needed for pain. 10/28/19   [name], MD  predniSONE (DELTASONE) 10 MG tablet 6 tablets for 1 day, then 5 for 1 day, then 4 for 1 day, then 3 for 1 day, then 2 for 1 day then 1 for 1 day. 11/03/19 11/09/19  [name], MD  rosuvastatin (CRESTOR) 20 MG tablet Take 1 tablet (20 mg total) by mouth daily at 6 PM. 08/17/19   Elgie Collard, PA-C  sildenafil (VIAGRA) 100 MG tablet Take 0.5-1 tablets (50-100 mg total) by mouth daily as needed for erectile dysfunction. 08/26/19   [name], MD     Allergies Simvastatin and Penicillins  Family History  Problem Relation Age of Onset  . Hypertension Mother     Social History Social History   Tobacco Use  . Smoking status: Former Research scientist (life sciences)  . Smokeless tobacco: Former Systems developer    Types: Chew    Quit date: 11/27/1968  . Tobacco comment: used 2 packs per week; quit over 40 years ago  Substance Use Topics  . Alcohol use: Not Currently    Alcohol/week: 0.0 standard drinks  . Drug use: No    Review of Systems  Constitutional: As above Eyes: No visual changes.  ENT: No sore throat. Cardiovascular: No chest pain Respiratory: Denies shortness of breath. Gastrointestinal: No abdominal pain.  No nausea, no vomiting.   Genitourinary: Negative for dysuria. Musculoskeletal: As above Skin: Negative for rash. Neurological: Negative for headaches or weakness   ____________________________________________   PHYSICAL EXAM:  VITAL SIGNS: ED Triage Vitals [11/07/19 1729]  Enc Vitals Group     BP 120/79     Pulse Rate (!) 101     Resp 18     Temp 98.6 F (37 C)     Temp Source Oral     SpO2 98 %     Weight  83.9 kg (185 lb)     Height 1.854 m (6\' 1" )     Head Circumference      Peak Flow      Pain Score 0     Pain Loc      Pain Edu?      Excl. in [place]?     Constitutional: Alert and oriented.   Nose: No congestion/rhinnorhea. Mouth/Throat: Mucous membranes are moist.    Cardiovascular: Normal rate, regular rhythm. Grossly normal heart sounds.  Good peripheral circulation. Respiratory: Normal respiratory effort.  No retractions. Lungs CTAB. Gastrointestinal: Soft and nontender. No distention.  No CVA tenderness.  Musculoskeletal: No lower extremity tenderness nor edema.  Warm and well perfused Neurologic:  Normal speech and language. No gross focal neurologic deficits are appreciated.  Skin:  Skin is warm, dry and intact. No rash noted. Psychiatric: Mood and affect are normal. Speech and behavior are normal.  ____________________________________________   LABS (all labs ordered are listed, but only abnormal results are displayed)  Labs Reviewed  SARS CORONAVIRUS 2 (TAT 6-24 HRS)   ____________________________________________  EKG  None ____________________________________________  RADIOLOGY  Chest x-ray unremarkable ____________________________________________   PROCEDURES  Procedure(s) performed: No  Procedures   Critical Care performed: No ____________________________________________   INITIAL IMPRESSION / ASSESSMENT AND PLAN / ED COURSE  Pertinent labs & imaging results that were available during my care of the patient were reviewed by me and considered in my medical decision making (see chart for details).  Patient presents with dry cough x4 days, spouse diagnosed with coronavirus less than 2 weeks ago.  Symptoms highly suspicious for novel coronavirus infection.  Vital signs are overall reassuring, symptomatic treatment at this time, return precautions discussed    ____________________________________________   FINAL CLINICAL IMPRESSION(S) / ED  DIAGNOSES  Final diagnoses:  Suspected COVID-19 virus infection        Note:  This document was prepared using Dragon voice recognition software and may include unintentional dictation errors.   Lavonia Drafts, MD 11/07/19 2151

## 2019-11-07 NOTE — Telephone Encounter (Signed)
From [facility] 

## 2019-11-07 NOTE — ED Triage Notes (Addendum)
Patient c/o cough for a couple of days. Feels winded with rapid movement. Unlabored breathing. Able to speak in complete sentences

## 2019-11-07 NOTE — ED Notes (Signed)
No peripheral IV placed this visit.   Discharge instructions reviewed with patient. Questions fielded by this RN. Patient verbalizes understanding of instructions. Patient discharged home in stable condition per kinner. No acute distress noted at time of discharge.

## 2019-11-08 LAB — SARS CORONAVIRUS 2 (TAT 6-24 HRS): SARS Coronavirus 2: POSITIVE — AB

## 2019-11-09 ENCOUNTER — Emergency Department
Admission: EM | Admit: 2019-11-09 | Discharge: 2019-11-09 | Disposition: A | Discharge status: Home / Self Care | Payer: Medicare HMO | Attending: Emergency Medicine | Admitting: Emergency Medicine

## 2019-11-09 ENCOUNTER — Telehealth: Payer: Self-pay

## 2019-11-09 ENCOUNTER — Emergency Department: Payer: Medicare HMO

## 2019-11-09 ENCOUNTER — Telehealth: Payer: Self-pay | Admitting: Unknown Physician Specialty

## 2019-11-09 ENCOUNTER — Other Ambulatory Visit: Payer: Self-pay

## 2019-11-09 ENCOUNTER — Encounter: Payer: Self-pay | Admitting: Emergency Medicine

## 2019-11-09 DIAGNOSIS — Z951 Presence of aortocoronary bypass graft: Secondary | ICD-10-CM | POA: Insufficient documentation

## 2019-11-09 DIAGNOSIS — E119 Type 2 diabetes mellitus without complications: Secondary | ICD-10-CM | POA: Insufficient documentation

## 2019-11-09 DIAGNOSIS — I259 Chronic ischemic heart disease, unspecified: Secondary | ICD-10-CM | POA: Insufficient documentation

## 2019-11-09 DIAGNOSIS — Z79899 Other long term (current) drug therapy: Secondary | ICD-10-CM | POA: Insufficient documentation

## 2019-11-09 DIAGNOSIS — I252 Old myocardial infarction: Secondary | ICD-10-CM | POA: Diagnosis not present

## 2019-11-09 DIAGNOSIS — R05 Cough: Secondary | ICD-10-CM | POA: Diagnosis not present

## 2019-11-09 DIAGNOSIS — U071 COVID-19: Secondary | ICD-10-CM | POA: Insufficient documentation

## 2019-11-09 DIAGNOSIS — Z7984 Long term (current) use of oral hypoglycemic drugs: Secondary | ICD-10-CM | POA: Diagnosis not present

## 2019-11-09 DIAGNOSIS — I10 Essential (primary) hypertension: Secondary | ICD-10-CM | POA: Diagnosis not present

## 2019-11-09 DIAGNOSIS — R0602 Shortness of breath: Secondary | ICD-10-CM | POA: Diagnosis not present

## 2019-11-09 DIAGNOSIS — Z7982 Long term (current) use of aspirin: Secondary | ICD-10-CM | POA: Diagnosis not present

## 2019-11-09 DIAGNOSIS — Z87891 Personal history of nicotine dependence: Secondary | ICD-10-CM | POA: Insufficient documentation

## 2019-11-09 DIAGNOSIS — Z7902 Long term (current) use of antithrombotics/antiplatelets: Secondary | ICD-10-CM | POA: Diagnosis not present

## 2019-11-09 DIAGNOSIS — R509 Fever, unspecified: Secondary | ICD-10-CM | POA: Diagnosis not present

## 2019-11-09 LAB — BASIC METABOLIC PANEL
Anion gap: 11 (ref 5–15)
BUN: 22 mg/dL (ref 8–23)
CO2: 21 mmol/L — ABNORMAL LOW (ref 22–32)
Calcium: 8.7 mg/dL — ABNORMAL LOW (ref 8.9–10.3)
Chloride: 98 mmol/L (ref 98–111)
Creatinine, Ser: 0.88 mg/dL (ref 0.61–1.24)
GFR calc Af Amer: >60 mL/min (ref >60)
GFR calc non Af Amer: >60 mL/min (ref >60)
Glucose, Bld: 295 mg/dL — ABNORMAL HIGH (ref 70–99)
Potassium: 4.4 mmol/L (ref 3.5–5.1)
Sodium: 130 mmol/L — ABNORMAL LOW (ref 135–145)

## 2019-11-09 LAB — CBC
HCT: 33.7 % — ABNORMAL LOW (ref 39.0–52.0)
Hemoglobin: 9.6 g/dL — ABNORMAL LOW (ref 13.0–17.0)
MCH: 19.1 pg — ABNORMAL LOW (ref 26.0–34.0)
MCHC: 28.5 g/dL — ABNORMAL LOW (ref 30.0–36.0)
MCV: 67 fL — ABNORMAL LOW (ref 80.0–100.0)
Platelets: 276 10*3/uL (ref 150–400)
RBC: 5.03 MIL/uL (ref 4.22–5.81)
RDW: 20.1 % — ABNORMAL HIGH (ref 11.5–15.5)
WBC: 9 10*3/uL (ref 4.0–10.5)
nRBC: 0 % (ref 0.0–0.2)

## 2019-11-09 LAB — TROPONIN I (HIGH SENSITIVITY): Troponin I (High Sensitivity): 10 ng/L (ref <18)

## 2019-11-09 MED ORDER — BENZONATATE 100 MG PO CAPS: 100.0000 mg | ORAL_CAPSULE | Freq: Four times a day (QID) | ORAL | 0 refills | Status: DC | PRN

## 2019-11-09 MED ORDER — AZITHROMYCIN 500 MG PO TABS: 500.0000 mg | ORAL_TABLET | Freq: Once | ORAL | Status: DC

## 2019-11-09 MED ORDER — SODIUM CHLORIDE 0.9% FLUSH: 3.0000 mL | Freq: Once | INTRAVENOUS | Status: DC

## 2019-11-09 NOTE — ED Notes (Signed)
Pt updated on plan of care

## 2019-11-09 NOTE — ED Triage Notes (Signed)
C/O cough, SOB x 1 week.   AAOx3.  Skin warm and dry.  No SOB/ DOE.  Speaks in full sentences.  NAD

## 2019-11-09 NOTE — ED Notes (Signed)
Pt returns to ED today c/o that he is not feeling any better after being diagnosed with COVID. States "I wish they would find a cure so I don't have to feel bad anymore." informed that it's a virus and that it will take time for the virus to leave his system and for him to feel back to normal. Pt c/o that he feels "winded" when he's moving around. Pt A&O, ambulated to treatment room, speaking in complete sentences. No resp distress noted.

## 2019-11-09 NOTE — Telephone Encounter (Signed)
Called to discuss with patient about Covid symptoms and the use of bamlanivimab, a monoclonal antibody infusion for those with mild to moderate Covid symptoms and at a high risk of hospitalization.  Pt is qualified for this infusion at the [facility] infusion center due to Age > 40 and other co-morbid conditions  He is feeling worse and headed to the ER.  If not admitted, good candidate for monoclonal antibodies.  Will call back later

## 2019-11-09 NOTE — Telephone Encounter (Signed)
Pt given Covid-19 positive results. Discussed mild, moderate and severe symptoms. Advised pt to call 911 for any respiratory issues and/dehydration. Discussed non test criteria for ending self isolation. Pt advised of way to manage symptoms at home and review isolation precautions especially the importance of washing hands frequently and wearing a mask when around others. Pt verbalized understanding. Pt stated that SOB has worsened. Advised to go to ED for reevaluation. Pt verbalized understanding.

## 2019-11-09 NOTE — ED Provider Notes (Signed)
[facility] Emergency Department Provider Note   ____________________________________________   First MD Initiated Contact with Patient 11/09/19 1215     (approximate)  I have reviewed the triage vital signs and the nursing notes.   HISTORY  Chief Complaint Shortness of breath   HPI Craig York is a 68 y.o. male for today he has been diagnosed with coronavirus.  He has had symptoms of cough shortness of breath for the week.  He notices whenever he gets about and tries to do things he has to stop and catch his breath.  Reports the symptoms been ongoing in nature, been developing an ongoing dry cough.  Not having any fevers or chills at this time.  Still eating and drinking, reports he staying well-hydrated.  No swelling.  Wife was diagnosed with Covid first in his family that he was diagnosed with a couple days ago here at the hospital  Overall reports he feels pretty well, wanted to get checked over as he notices he is short of breath when he is walking  Minimal feeling shortness of breath at rest.  No chest pain   Past Medical History:  Diagnosis Date  . Anginal pain ([place])   . Benign neoplasm of descending colon   . BPH (benign prostatic hyperplasia)   . Coronary artery disease   . Diabetes mellitus without complication ([place])   . Hemorrhage of gastrointestinal tract 07/23/2008  . Hyperlipidemia   . Hypertension   . Iron deficiency anemia   . Myocardial infarction ([place])   . Polyp of sigmoid colon     Patient Active Problem List   Diagnosis Date Noted  . S/P CABG x 4 08/12/2019  . History of adenomatous polyp of colon 07/05/2019  . Gastric polyp   . Iron (Fe) deficiency anemia 04/19/2017  . Fracture of metatarsal bone, right sequela 06/04/2015  . H/O acute myocardial infarction 06/04/2015  . Lumbar radiculopathy 06/04/2015  . Sciatica 02/22/2009  . Diabetes mellitus, type 2 ([place]) 01/07/2009  . Lumbosacral spondylosis 12/06/2008  . ED  (erectile dysfunction) of organic origin 10/18/2008  . Coronary artery disease 10/16/2008  . HLD (hyperlipidemia) 07/23/2008  . Essential (primary) hypertension 07/23/2008    Past Surgical History:  Procedure Laterality Date  . CARDIAC CATHETERIZATION    . chest surgery for fungal infection    . COLONOSCOPY WITH PROPOFOL N/A 07/10/2017   Procedure: COLONOSCOPY WITH PROPOFOL;  Surgeon: [name], MD;  Location: [facility] ENDOSCOPY;  Service: Endoscopy;  Laterality: N/A;  . CORONARY ARTERY BYPASS GRAFT N/A 08/12/2019   Procedure: CORONARY ARTERY BYPASS GRAFTING (CABG)X 4;  Surgeon: [name], MD;  Location: [place];  Service: Open Heart Surgery;  Laterality: N/A;  CABG x  4  using bilateral internal mammary arteries and endoscopically harvested left saphenous vein  . ESOPHAGOGASTRODUODENOSCOPY (EGD) WITH PROPOFOL N/A 07/10/2017   Procedure: ESOPHAGOGASTRODUODENOSCOPY (EGD) WITH PROPOFOL;  Surgeon: [name], MD;  Location: [facility] END[facility]COPY;  Service: Endoscopy;  Laterality: N/A;  . HERNIA REPAIR    . LEFT HEART CATH AND CORONARY ANGIOGRAPHY Left 07/21/2019   Procedure: LEFT HEART CATH AND CORONARY ANGIOGRAPHY;  Surgeon: [name], MD;  Location: [facility];  Service: Cardiovascular;  Laterality: Left;  . TEE WITHOUT CARDIOVERSION N/A 08/12/2019   Procedure: TRANSESOPHAGEAL ECHOCARDIOGRAM (TEE);  Surgeon: [name], MD;  Location: [place];  Service: Open Heart Surgery;  Laterality: N/A;    Prior to Admission medications   Medication Sig Start Date End  Date Taking? Authorizing Provider  acetaminophen (TYLENOL) 500 MG tablet Take 1,000 mg by mouth every 6 (six) hours as needed for moderate pain.    [provider]  amiodarone (PACERONE) 200 MG tablet Take 200 mg by mouth 2 (two) times daily. 08/17/19   [provider]  aspirin 81 MG tablet Take 81 mg by mouth daily.  07/23/08   [provider]  clopidogrel (PLAVIX) 75 MG tablet Take 1 tablet  (75 mg total) by mouth daily. 10/08/19   [name], MD  isosorbide dinitrate (ISORDIL) 10 MG tablet Take 1 tablet (10 mg total) by mouth 3 (three) times daily. 08/17/19   Elgie Collard, PA-C  lisinopril (ZESTRIL) 10 MG tablet Take 1 tablet (10 mg total) by mouth daily. 10/08/19   [name], MD  metFORMIN (GLUCOPHAGE) 500 MG tablet TAKE TWO TABLETS BY MOUTH TWICE DAILY WITH A MEAL 09/01/19   [name], MD  metoprolol tartrate (LOPRESSOR) 25 MG tablet Take 1 tablet (25 mg total) by mouth every 8 (eight) hours. 10/08/19   [name], MD  oxyCODONE-acetaminophen (PERCOCET) 10-325 MG tablet Take 1 tablet by mouth every 8 (eight) hours as needed for pain. 10/28/19   [name], MD  predniSONE (DELTASONE) 10 MG tablet 6 tablets for 1 day, then 5 for 1 day, then 4 for 1 day, then 3 for 1 day, then 2 for 1 day then 1 for 1 day. 11/03/19 11/09/19  [name], MD  rosuvastatin (CRESTOR) 20 MG tablet Take 1 tablet (20 mg total) by mouth daily at 6 PM. 08/17/19   Elgie Collard, PA-C  sildenafil (VIAGRA) 100 MG tablet Take 0.5-1 tablets (50-100 mg total) by mouth daily as needed for erectile dysfunction. 08/26/19   [name], MD    Allergies Simvastatin and Penicillins  Family History  Problem Relation Age of Onset  . Hypertension Mother     Social History Social History   Tobacco Use  . Smoking status: Former Research scientist (life sciences)  . Smokeless tobacco: Former Systems developer    Types: Chew    Quit date: 11/27/1968  . Tobacco comment: used 2 packs per week; quit over 40 years ago  Substance Use Topics  . Alcohol use: Not Currently    Alcohol/week: 0.0 standard drinks  . Drug use: No    Review of Systems Constitutional: No fever/chills but bit fatigued Eyes: No visual changes. ENT: No sore throat.  He normally Cardiovascular: Denies chest pain. Respiratory: See HPI Gastrointestinal: No abdominal pain.   Genitourinary: Negative for dysuria. Musculoskeletal: Negative for back  pain. Skin: Negative for rash. Neurological: Negative for headaches, areas of focal weakness or numbness.    ____________________________________________   PHYSICAL EXAM:  VITAL SIGNS: ED Triage Vitals  Enc Vitals Group     BP 11/09/19 1141 104/87     Pulse Rate 11/09/19 1141 92     Resp 11/09/19 1141 (!) 22     Temp 11/09/19 1141 98.5 F (36.9 C)     Temp Source 11/09/19 1141 Oral     SpO2 11/09/19 1141 100 %     Weight 11/09/19 1151 184 lb 15.5 oz (83.9 kg)     Height 11/09/19 1151 6\' 1"  (1.854 m)     Head Circumference --      Peak Flow --      Pain Score 11/09/19 1151 0     Pain Loc --      Pain Edu? --  Excl. in [place]? --     Constitutional: Alert and oriented. Well appearing and in no acute distress.  Resting pleasantly, normal oxygen saturation on room air.  He is very conversant and speaks in full sentences without distress Eyes: Conjunctivae are normal. Head: Atraumatic. Nose: No congestion/rhinnorhea. Mouth/Throat: Mucous membranes are moist. Neck: No stridor.  Cardiovascular: Normal rate, regular rhythm. Grossly normal heart sounds.  Good peripheral circulation. Respiratory: Normal respiratory effort.  No retractions. Lungs CTAB.  Occasional dry cough. Gastrointestinal: Soft and nontender. No distention. Musculoskeletal: No lower extremity tenderness nor edema. Neurologic:  Normal speech and language. No gross focal neurologic deficits are appreciated.  Skin:  Skin is warm, dry and intact. No rash noted. Psychiatric: Mood and affect are normal. Speech and behavior are normal.  ____________________________________________   LABS (all labs ordered are listed, but only abnormal results are displayed)  Labs Reviewed  BASIC METABOLIC PANEL - Abnormal; Notable for the following components:      Result Value   Sodium 130 (*)    CO2 21 (*)    Glucose, Bld 295 (*)    Calcium 8.7 (*)    All other components within normal limits  CBC - Abnormal; Notable for  the following components:   Hemoglobin 9.6 (*)    HCT 33.7 (*)    MCV 67.0 (*)    MCH 19.1 (*)    MCHC 28.5 (*)    RDW 20.1 (*)    All other components within normal limits  TROPONIN I (HIGH SENSITIVITY)  TROPONIN I (HIGH SENSITIVITY)   ____________________________________________  EKG  Reviewed and interpreted me at 1145 Heart rate 99 QRS 89 QTc 420 Normal sinus rhythm, biphasic appearance T wave in V2 as well as inversions lead I aVL.  Q wave in lead III.  Of note the patient complains of no chest pain  Previous EKG difficult to make comparison with, today's EKG certainly appears improved as is prior twelve-lead looks concerning for a STEMI.  No STEMI demonstrated today, unfortunately I do not have EKGs to review after his coronary bypass to see if today's changes are acute or not though he denies symptoms of any chest pain his only symptom has been shortness of breath for about 1 week.  Does not report any worsening of symptoms, and I suspect if his troponin is normal given his chronicity of symptoms for a week and known diagnosis of coronavirus that no further acute cardiac work-up would be necessitated at this time ____________________________________________  RADIOLOGY  DG Chest Port 1 View  Result Date: 11/09/2019 CLINICAL DATA:  Patient reports improved COVID related symptoms since being seen on Friday. SOB and cough. Subjective fever. Hx MI, HTN, DM, CABG. Former smoker. EXAM: PORTABLE CHEST 1 VIEW COMPARISON:  Chest radiograph 11/07/2019 FINDINGS: Stable cardiomediastinal contours status post median sternotomy and CABG. Persistent elevation of the right hemidiaphragm. Streaky opacities at the right lung base favored to represent atelectasis. Mildly increased heterogeneous opacities at left base could reflect atelectasis versus infiltrate. No pneumothorax or large pleural effusion. No acute finding in the visualized skeleton. IMPRESSION: Mildly increased heterogeneous opacities at  the left base are concerning for infection, less likely atelectasis. Electronically Signed   By: Audie Pinto M.D.   On: 11/09/2019 13:03     ____________________________________________   PROCEDURES  Procedure(s) performed: None  Procedures  Critical Care performed: No  ____________________________________________   INITIAL IMPRESSION / ASSESSMENT AND PLAN / ED COURSE  Pertinent labs & imaging results that were available  during my care of the patient were reviewed by me and considered in my medical decision making (see chart for details).   Patient is for evaluation of dyspnea.  Dry cough.  Clinical exam and lab work very reassuring.  I do not see evidence to suggest need for inpatient hospitalization for coronavirus.  Consideration is made for potentially starting patient on azithromycin, but in review and his known positive Covid status current work of breathing which is reassuring, I do not see indication for treatment in addition the patient is on amiodarone and I suspect azithromycin risk would be, concerning combination with that.  There is no known outpatient antibiotic for this disease at this time.  Reassuring exam, return precautions advised.  Clinical Course as of Nov 08 1325  Sun Nov 09, 2019  1323 EKG reviewed with Dr. Ubaldo Glassing. Appears likely due to old MI in same region. Advises outpatient follow-up.   [MQ]    Clinical Course User Index [MQ] Delman Kitten, MD   No pleuritic chest pain.  No chest pain at all.  Discussed with cardiology to feel a follow-up in a few weeks time would be reasonable and that his current EKG likely represents evolution of his old cardiac event from September along with combination of his troponin and no chest pain being very reassuring  Return precautions and treatment recommendations and follow-up discussed with the patient who is agreeable with the plan.   ____________________________________________   FINAL CLINICAL IMPRESSION(S) /  ED DIAGNOSES  Final diagnoses:  COVID-19 virus infection        Note:  This document was prepared using Dragon voice recognition software and may include unintentional dictation errors       Delman Kitten, MD 11/09/19 1328

## 2019-11-09 NOTE — Telephone Encounter (Signed)
  I connected by phone with Craig York on 11/09/2019 at 5:08 PM to discuss the potential use of an new treatment for mild to moderate COVID-19 viral infection in non-hospitalized patients.  This patient is a 68 y.o. male that meets the FDA criteria for Emergency Use Authorization of bamlanivimab or casirivimab\imdevimab.  Has a (+) direct SARS-CoV-2 viral test result  Has mild or moderate COVID-19   Is ? 68 years of age and weighs ? 40 kg  Is NOT hospitalized due to COVID-19  Is NOT requiring oxygen therapy or requiring an increase in baseline oxygen flow rate due to COVID-19  Is within 10 days of symptom onset  Has at least one of the high risk factor(s) for progression to severe COVID-19 and/or hospitalization as defined in EUA.  Specific high risk criteria : >/= 68 yo   I have spoken and communicated the following to the patient or parent/caregiver:  1. FDA has authorized the emergency use of bamlanivimab and casirivimab\imdevimab for the treatment of mild to moderate COVID-19 in adults and pediatric patients with positive results of direct SARS-CoV-2 viral testing who are 23 years of age and older weighing at least 40 kg, and who are at high risk for progressing to severe COVID-19 and/or hospitalization.  2. The significant known and potential risks and benefits of bamlanivimab and casirivimab\imdevimab, and the extent to which such potential risks and benefits are unknown.  3. Information on available alternative treatments and the risks and benefits of those alternatives, including clinical trials.  4. Patients treated with bamlanivimab and casirivimab\imdevimab should continue to self-isolate and use infection control measures (e.g., wear mask, isolate, social distance, avoid sharing personal items, clean and disinfect "high touch" surfaces, and frequent handwashing) according to CDC guidelines.   5. The patient or parent/caregiver has the option to accept or refuse  bamlanivimab or casirivimab\imdevimab .  After reviewing this information with the patient, The patient has DECLINED offer to receive the infusion.Kathrine Haddock 11/09/2019 5:08 PM

## 2019-11-09 NOTE — ED Notes (Signed)
Per MD, pt pending clearance from cardiology for D/C.

## 2019-12-01 ENCOUNTER — Other Ambulatory Visit: Payer: Self-pay | Admitting: Family Medicine

## 2019-12-01 DIAGNOSIS — M5416 Radiculopathy, lumbar region: Secondary | ICD-10-CM

## 2019-12-01 MED ORDER — OXYCODONE-ACETAMINOPHEN 10-325 MG PO TABS: 1.0000 | ORAL_TABLET | Freq: Three times a day (TID) | ORAL | 0 refills | Status: DC | PRN

## 2019-12-01 MED ORDER — METFORMIN HCL 500 MG PO TABS: ORAL_TABLET | ORAL | 1 refills | Status: DC

## 2019-12-01 NOTE — Telephone Encounter (Signed)
oxyCODONE-acetaminophen (PERCOCET) 10-325 MG tablet metFORMIN (GLUCOPHAGE) 500 MG tablet    Patient is requesting refills.    Pharmacy:  [facility] ([place]), [place] - [address] Phone:  [phone]  Fax:  [phone]

## 2019-12-01 NOTE — Telephone Encounter (Signed)
Requested medication (s) are due for refill today: yes  Requested medication (s) are on the active medication list: yes  Last refill:  10/28/2019  Future visit scheduled: yes  Notes to clinic:  This refill cannot be delegated    Requested Prescriptions  Pending Prescriptions Disp Refills   oxyCODONE-acetaminophen (PERCOCET) 10-325 MG tablet 30 tablet 0    Sig: Take 1 tablet by mouth every 8 (eight) hours as needed for pain.      Not Delegated - Analgesics:  Opioid Agonist Combinations Failed - 12/01/2019 11:06 AM      Failed - This refill cannot be delegated      Failed - Urine Drug Screen completed in last 360 days.      Passed - Valid encounter within last 6 months    Recent Outpatient Visits           3 weeks ago Shortness of breath   [place], [place], [place]   4 weeks ago Bursitis of hip, unspecified bursa, unspecified laterality   [facility] [name], MD   4 weeks ago    [place], Retia Passe., MD   3 months ago Type 2 diabetes mellitus with microalbuminuria, without long-term current use of insulin [facility])   [facility] [name], MD   5 months ago Chest pain, unspecified type   [facility] [name], MD       Future Appointments             In 3 weeks Fisher, Kirstie Peri, MD [facility]             Signed Prescriptions Disp Refills   metFORMIN (GLUCOPHAGE) 500 MG tablet 360 tablet 1    Sig: TAKE TWO TABLETS BY MOUTH TWICE DAILY WITH A MEAL      Endocrinology:  Diabetes - Biguanides Passed - 12/01/2019 11:06 AM      Passed - Cr in normal range and within 360 days    Creatinine, Ser  Date Value Ref Range Status  11/09/2019 0.88 0.61 - 1.24 mg/dL Final   Creatinine, POC  Date Value Ref Range Status  11/06/2017 n/a mg/dL Final          Passed - HBA1C is between 0 and 7.9 and within 180 days    Hgb A1c MFr Bld  Date  Value Ref Range Status  08/08/2019 5.9 (H) 4.8 - 5.6 % Final    Comment:    (NOTE) Pre diabetes:          5.7%-6.4% Diabetes:              >6.4% Glycemic control for   <7.0% adults with diabetes           Passed - eGFR in normal range and within 360 days    GFR calc Af Amer  Date Value Ref Range Status  11/09/2019 >60 >60 mL/min Final   GFR calc non Af Amer  Date Value Ref Range Status  11/09/2019 >60 >60 mL/min Final          Passed - Valid encounter within last 6 months    Recent Outpatient Visits           3 weeks ago Shortness of breath   [place], [place], [place]   4 weeks ago Bursitis of hip, unspecified bursa, unspecified laterality   [facility] [name], MD  4 weeks ago    [facility] Jerrol Banana., MD   3 months ago Type 2 diabetes mellitus with microalbuminuria, without long-term current use of insulin [facility])   [place], [place]   5 months ago Chest pain, unspecified type   [facility] [name], MD       Future Appointments             In 3 weeks Fisher, Kirstie Peri, MD [facility], [place]

## 2019-12-18 DIAGNOSIS — E113393 Type 2 diabetes mellitus with moderate nonproliferative diabetic retinopathy without macular edema, bilateral: Secondary | ICD-10-CM | POA: Diagnosis not present

## 2019-12-25 DIAGNOSIS — I2102 ST elevation (STEMI) myocardial infarction involving left anterior descending coronary artery: Secondary | ICD-10-CM | POA: Diagnosis not present

## 2019-12-25 DIAGNOSIS — U071 COVID-19: Secondary | ICD-10-CM | POA: Diagnosis not present

## 2019-12-25 DIAGNOSIS — I25118 Atherosclerotic heart disease of native coronary artery with other forms of angina pectoris: Secondary | ICD-10-CM | POA: Diagnosis not present

## 2019-12-25 DIAGNOSIS — E7849 Other hyperlipidemia: Secondary | ICD-10-CM | POA: Diagnosis not present

## 2019-12-25 DIAGNOSIS — R0600 Dyspnea, unspecified: Secondary | ICD-10-CM | POA: Diagnosis not present

## 2019-12-25 DIAGNOSIS — Z951 Presence of aortocoronary bypass graft: Secondary | ICD-10-CM | POA: Diagnosis not present

## 2019-12-25 DIAGNOSIS — E119 Type 2 diabetes mellitus without complications: Secondary | ICD-10-CM | POA: Diagnosis not present

## 2019-12-25 DIAGNOSIS — I10 Essential (primary) hypertension: Secondary | ICD-10-CM | POA: Diagnosis not present

## 2019-12-25 HISTORY — DX: ST elevation (STEMI) myocardial infarction involving left anterior descending coronary artery: I21.02

## 2019-12-26 ENCOUNTER — Ambulatory Visit: Payer: Self-pay | Admitting: Family Medicine

## 2019-12-26 ENCOUNTER — Other Ambulatory Visit: Payer: Self-pay | Admitting: Family Medicine

## 2019-12-26 ENCOUNTER — Telehealth: Payer: Self-pay | Admitting: Family Medicine

## 2019-12-26 NOTE — Telephone Encounter (Signed)
fyi [facility] at [place] is calling to let dr Caryn Section know dr Clayborn Bigness changed metoprolol to er 25 mg once a day

## 2019-12-29 ENCOUNTER — Other Ambulatory Visit: Payer: Self-pay | Admitting: Family Medicine

## 2019-12-29 DIAGNOSIS — M5416 Radiculopathy, lumbar region: Secondary | ICD-10-CM

## 2019-12-29 MED ORDER — OXYCODONE-ACETAMINOPHEN 10-325 MG PO TABS: 1.0000 | ORAL_TABLET | Freq: Three times a day (TID) | ORAL | 0 refills | Status: DC | PRN

## 2019-12-29 NOTE — Telephone Encounter (Signed)
Medication Refill - Medication: oxyCODONE-acetaminophen (PERCOCET) 10-325 MG tablet    Has the patient contacted their pharmacy? No. (Agent: If no, request that the patient contact the pharmacy for the refill.) (Agent: If yes, when and what did the pharmacy advise?)  Preferred Pharmacy (with phone number or street name):  [facility] [place] [facility] [address] Phone:  [phone]  Fax:  [phone]       Agent: Please be advised that RX refills may take up to 3 business days. We ask that you follow-up with your pharmacy.

## 2020-01-05 DIAGNOSIS — Z0100 Encounter for examination of eyes and vision without abnormal findings: Secondary | ICD-10-CM | POA: Diagnosis not present

## 2020-01-05 DIAGNOSIS — E113393 Type 2 diabetes mellitus with moderate nonproliferative diabetic retinopathy without macular edema, bilateral: Secondary | ICD-10-CM | POA: Diagnosis not present

## 2020-01-21 ENCOUNTER — Encounter: Payer: Self-pay | Admitting: Family Medicine

## 2020-01-21 ENCOUNTER — Ambulatory Visit (INDEPENDENT_AMBULATORY_CARE_PROVIDER_SITE_OTHER): Payer: Medicare HMO | Admitting: Family Medicine

## 2020-01-21 ENCOUNTER — Other Ambulatory Visit: Payer: Self-pay

## 2020-01-21 VITALS — BP 120/62 | HR 85 | Temp 97.8°F | Resp 16 | Wt 181.0 lb

## 2020-01-21 DIAGNOSIS — N529 Male erectile dysfunction, unspecified: Secondary | ICD-10-CM | POA: Diagnosis not present

## 2020-01-21 DIAGNOSIS — E1129 Type 2 diabetes mellitus with other diabetic kidney complication: Secondary | ICD-10-CM

## 2020-01-21 DIAGNOSIS — R809 Proteinuria, unspecified: Secondary | ICD-10-CM

## 2020-01-21 DIAGNOSIS — I25118 Atherosclerotic heart disease of native coronary artery with other forms of angina pectoris: Secondary | ICD-10-CM

## 2020-01-21 LAB — GLUCOSE, POCT (MANUAL RESULT ENTRY): POC Glucose: 311 mg/dl — AB (ref 70–99)

## 2020-01-21 LAB — POCT GLYCOSYLATED HEMOGLOBIN (HGB A1C)
Est. average glucose Bld gHb Est-mCnc: 275
Hemoglobin A1C: 11.2 % — AB (ref 4.0–5.6)

## 2020-01-21 MED ORDER — JARDIANCE 10 MG PO TABS: 10.0000 mg | ORAL_TABLET | Freq: Every day | ORAL | 0 refills | Status: DC

## 2020-01-21 MED ORDER — TADALAFIL 10 MG PO TABS: 10.0000 mg | ORAL_TABLET | Freq: Every day | ORAL | 2 refills | Status: DC | PRN

## 2020-01-21 NOTE — Progress Notes (Signed)
Patient: Craig York Male    DOB: 09-Jul-1951   69 y.o.   MRN: [id number] Visit Date: 01/21/2020  Today's Provider: Lelon Huh, MD   Chief Complaint  Patient presents with  . Diabetes  . Hypertension  . Hyperlipidemia   Subjective:     HPI  Diabetes Mellitus Type II, Follow-up:   Lab Results  Component Value Date   HGBA1C 5.9 (H) 08/08/2019   HGBA1C 6.4 (H) 07/01/2019   HGBA1C 6.7 (H) 02/11/2019   Last seen for diabetes 5 months ago.  Management since then includes no change. He reports good compliance with treatment. He is not having side effects.  Current symptoms include none  Home blood sugar records: blood sugars are not checked  Episodes of hypoglycemia? no   Current Insulin Regimen: N/A Most Recent Eye Exam: 02/11/2018 Weight trend: decreasing steadily Prior visit with dietician: no Current diet: well balanced Current exercise: walking  ------------------------------------------------------------------------   Hypertension, follow-up:  BP Readings from Last 3 Encounters:  01/21/20 120/62  11/09/19 123/81  11/07/19 120/79    He was last seen for hypertension 5 months ago.  BP at that visit was 131/68. Management since that visit includes no change .He reports good compliance with treatment. He is not having side effects.  He is exercising. He is not adherent to low salt diet.   Outside blood pressures are 118/68 (checked at the pharmacy). He is experiencing none.  Patient denies chest pain, chest pressure/discomfort, irregular heart beat and palpitations.   Cardiovascular risk factors include advanced age (older than 35 for men, 65 for women), diabetes mellitus, hypertension and male gender.  Use of agents associated with hypertension: none.   ------------------------------------------------------------------------  Lipid/Cholesterol, Follow-up:   Last seen for this 5 months ago.  Management changes since that visit include no  change.  Last Lipid Panel:    Component Value Date/Time   CHOL 137 07/01/2019 1050   TRIG 120 07/01/2019 1050   HDL 41 07/01/2019 1050   CHOLHDL 3.3 07/01/2019 1050   CHOLHDL 5.5 02/05/2008 0355   VLDL 23 02/05/2008 0355   LDLCALC 72 07/01/2019 1050    Risk factors for vascular disease include diabetes mellitus and hypertension  He reports good compliance with treatment. He is not having side effects.  Current symptoms include none  Weight trend: stable Prior visit with dietician: no Current diet: well balanced Current exercise: walking  Wt Readings from Last 3 Encounters:  01/21/20 181 lb (82.1 kg)  11/09/19 184 lb 15.5 oz (83.9 kg)  11/07/19 185 lb (83.9 kg)    -------------------------------------------------------------------   Allergies  Allergen Reactions  . Simvastatin     Elevated CK  . Penicillins Rash    Did it involve swelling of the face/tongue/throat, SOB, or low BP? No Did it involve sudden or severe rash/hives, skin peeling, or any reaction on the inside of your mouth or nose? No Did you need to seek medical attention at a hospital or doctor's office? No When did it last happen? If all above answers are "NO", may proceed with cephalosporin use.      Current Outpatient Medications:  .  acetaminophen (TYLENOL) 500 MG tablet, Take 1,000 mg by mouth every 6 (six) hours as needed for moderate pain., Disp: , Rfl:  .  amiodarone (PACERONE) 200 MG tablet, Take 200 mg by mouth 2 (two) times daily., Disp: , Rfl:  .  aspirin 81 MG tablet, Take 81 mg by mouth daily. ,  Disp: , Rfl:  .  clopidogrel (PLAVIX) 75 MG tablet, Take 1 tablet (75 mg total) by mouth daily., Disp: 90 tablet, Rfl: 9 .  isosorbide dinitrate (ISORDIL) 10 MG tablet, Take 1 tablet (10 mg total) by mouth 3 (three) times daily., Disp: 90 tablet, Rfl: 1 .  lisinopril (ZESTRIL) 2.5 MG tablet, Take 1 tablet by mouth daily., Disp: , Rfl:  .  metFORMIN (GLUCOPHAGE) 500 MG tablet, TAKE TWO  TABLETS BY MOUTH TWICE DAILY WITH A MEAL, Disp: 360 tablet, Rfl: 1 .  metoprolol succinate (TOPROL-XL) 25 MG 24 hr tablet, Take 25 mg by mouth daily., Disp: , Rfl:  .  oxyCODONE-acetaminophen (PERCOCET) 10-325 MG tablet, Take 1 tablet by mouth every 8 (eight) hours as needed for pain., Disp: 30 tablet, Rfl: 0 .  rosuvastatin (CRESTOR) 20 MG tablet, Take 1 tablet (20 mg total) by mouth daily at 6 PM., Disp: 30 tablet, Rfl: 1 .  sildenafil (VIAGRA) 100 MG tablet, Take 0.5-1 tablets (50-100 mg total) by mouth daily as needed for erectile dysfunction., Disp: 5 tablet, Rfl: 11  Review of Systems  Constitutional: Negative for appetite change, chills and fever.  Respiratory: Negative for chest tightness, shortness of breath and wheezing.   Cardiovascular: Negative for chest pain and palpitations.  Gastrointestinal: Negative for abdominal pain, nausea and vomiting.    Social History   Tobacco Use  . Smoking status: Former Research scientist (life sciences)  . Smokeless tobacco: Former Systems developer    Types: Chew    Quit date: 11/27/1968  . Tobacco comment: used 2 packs per week; quit over 40 years ago  Substance Use Topics  . Alcohol use: Not Currently    Alcohol/week: 0.0 standard drinks      Objective:   BP 120/62 (BP Location: Left Arm, Patient Position: Sitting, Cuff Size: Normal)   Pulse 85   Temp 97.8 F (36.6 C) (Temporal)   Resp 16   Wt 181 lb (82.1 kg)   SpO2 99% Comment: room air  BMI 23.88 kg/m  Vitals:   01/21/20 1102  BP: 120/62  Pulse: 85  Resp: 16  Temp: 97.8 F (36.6 C)  TempSrc: Temporal  SpO2: 99%  Weight: 181 lb (82.1 kg)  Body mass index is 23.88 kg/m.   Physical Exam   General: Appearance:    Well developed, well nourished male in no acute distress  Eyes:    PERRL, conjunctiva/corneas clear, EOM's intact       Lungs:     Clear to auscultation bilaterally, respirations unlabored  Heart:    Normal heart rate. Normal rhythm. No murmurs, rubs, or gallops.   MS:   All extremities are  intact.   Neurologic:   Awake, alert, oriented x 3. No apparent focal neurological           defect.       Results for orders placed or performed in visit on 01/21/20  POCT HgB A1C  Result Value Ref Range   Hemoglobin A1C 11.2 (A) 4.0 - 5.6 %   Est. average glucose Bld gHb Est-mCnc 275   POCT Glucose (CBG)  Result Value Ref Range   POC Glucose 311 (A) 70 - 99 mg/dl       Assessment & Plan    1. Type 2 diabetes mellitus with microalbuminuria, without long-term current use of insulin (HCC) Uncontrolled. Add samples- empagliflozin (JARDIANCE) 10 MG TABS tablet; Take 10 mg by mouth daily before breakfast.  Dispense: 28 tablet; Refill: 0  Return in 4 weeks  2. Coronary artery disease involving native coronary artery of native heart with angina pectoris Add Jardiance as above. On isosorbide and metoprolol. Continue routine follow up with cardiology.   3. Erectile dysfunction, unspecified erectile dysfunction type Counseled patient regarding potential interaction between isosorbide which he was started on in September, and ED medications. He has taken several dose of 100mg  sildenfil since then and reports he had no adverse effects, it just didn't last very long and requests prescription for Cialis. Advised of hypotension precautions and prescribed low dose to take cautiously.  - tadalafil (CIALIS) 10 MG tablet; Take 1 tablet (10 mg total) by mouth daily as needed for erectile dysfunction.  Dispense: 30 tablet; Refill: 2  The entirety of the information documented in the History of Present Illness, Review of Systems and Physical Exam were personally obtained by me. Portions of this information were initially documented by Meyer Cory, CMA and reviewed by me for thoroughness and accuracy.      Lelon Huh, MD  [facility]

## 2020-01-21 NOTE — Patient Instructions (Addendum)
.   Please bring all of your medications to every appointment so we can make sure that our medication list is the same as yours.    Start taking samples of 10mg  Jardiance once every morning to control your blood sugar and protect your heart from diabetes

## 2020-01-26 ENCOUNTER — Other Ambulatory Visit: Payer: Self-pay | Admitting: Family Medicine

## 2020-01-26 DIAGNOSIS — M5416 Radiculopathy, lumbar region: Secondary | ICD-10-CM

## 2020-01-26 MED ORDER — OXYCODONE-ACETAMINOPHEN 10-325 MG PO TABS: 1.0000 | ORAL_TABLET | Freq: Three times a day (TID) | ORAL | 0 refills | Status: DC | PRN

## 2020-01-26 NOTE — Telephone Encounter (Signed)
Pt is requesting a refill for oxyCODONE-acetaminophen (PERCOCET) 10-325 MG tablet    Pharmacy: [facility] [address] [place] [address]  [address] [place] ([place]) [place] [address]  Phone:  [phone] Fax:  [phone]

## 2020-02-04 ENCOUNTER — Telehealth: Payer: Self-pay

## 2020-02-04 ENCOUNTER — Other Ambulatory Visit: Payer: Self-pay

## 2020-02-04 ENCOUNTER — Ambulatory Visit (INDEPENDENT_AMBULATORY_CARE_PROVIDER_SITE_OTHER): Payer: Medicare HMO | Admitting: Physician Assistant

## 2020-02-04 ENCOUNTER — Encounter: Payer: Self-pay | Admitting: Physician Assistant

## 2020-02-04 VITALS — BP 126/72 | HR 83 | Temp 97.3°F | Wt 187.2 lb

## 2020-02-04 DIAGNOSIS — E1129 Type 2 diabetes mellitus with other diabetic kidney complication: Secondary | ICD-10-CM | POA: Diagnosis not present

## 2020-02-04 DIAGNOSIS — R809 Proteinuria, unspecified: Secondary | ICD-10-CM | POA: Diagnosis not present

## 2020-02-04 NOTE — Telephone Encounter (Signed)
Patient states he was recently seen by his eye doctor (Dr. Gloriann Loan) for blurred vision. Per patient, Dr. Gloriann Loan is requesting that his primary care order blood work on him and an A1C before he prescribes him new glasses. I advised patient that we just checked his A1C 2 weeks ago. Patient states he still needs it rechecked in order for Dr. Gloriann Loan to prescribe him new glasses. Dr. Caryn Section is out of the office until Friday. Appointment scheduled with Craig York today at 2:20pm.

## 2020-02-04 NOTE — Telephone Encounter (Signed)
FYI

## 2020-02-04 NOTE — Patient Instructions (Signed)
Diabetes Mellitus and Exercise Exercising regularly is important for your overall health, especially when you have diabetes (diabetes mellitus). Exercising is not only about losing weight. It has many other health benefits, such as increasing muscle strength and bone density and reducing body fat and stress. This leads to improved fitness, flexibility, and endurance, all of which result in better overall health. Exercise has additional benefits for people with diabetes, including:  Reducing appetite.  Helping to lower and control blood glucose.  Lowering blood pressure.  Helping to control amounts of fatty substances (lipids) in the blood, such as cholesterol and triglycerides.  Helping the body to respond better to insulin (improving insulin sensitivity).  Reducing how much insulin the body needs.  Decreasing the risk for heart disease by: ? Lowering cholesterol and triglyceride levels. ? Increasing the levels of good cholesterol. ? Lowering blood glucose levels. What is my activity plan? Your health care provider or certified diabetes educator can help you make a plan for the type and frequency of exercise (activity plan) that works for you. Make sure that you:  Do at least 150 minutes of moderate-intensity or vigorous-intensity exercise each week. This could be brisk walking, biking, or water aerobics. ? Do stretching and strength exercises, such as yoga or weightlifting, at least 2 times a week. ? Spread out your activity over at least 3 days of the week.  Get some form of physical activity every day. ? Do not go more than 2 days in a row without some kind of physical activity. ? Avoid being inactive for more than 30 minutes at a time. Take frequent breaks to walk or stretch.  Choose a type of exercise or activity that you enjoy, and set realistic goals.  Start slowly, and gradually increase the intensity of your exercise over time. What do I need to know about managing my  diabetes?   Check your blood glucose before and after exercising. ? If your blood glucose is 240 mg/dL (13.3 mmol/L) or higher before you exercise, check your urine for ketones. If you have ketones in your urine, do not exercise until your blood glucose returns to normal. ? If your blood glucose is 100 mg/dL (5.6 mmol/L) or lower, eat a snack containing 15-20 grams of carbohydrate. Check your blood glucose 15 minutes after the snack to make sure that your level is above 100 mg/dL (5.6 mmol/L) before you start your exercise.  Know the symptoms of low blood glucose (hypoglycemia) and how to treat it. Your risk for hypoglycemia increases during and after exercise. Common symptoms of hypoglycemia can include: ? Hunger. ? Anxiety. ? Sweating and feeling clammy. ? Confusion. ? Dizziness or feeling light-headed. ? Increased heart rate or palpitations. ? Blurry vision. ? Tingling or numbness around the mouth, lips, or tongue. ? Tremors or shakes. ? Irritability.  Keep a rapid-acting carbohydrate snack available before, during, and after exercise to help prevent or treat hypoglycemia.  Avoid injecting insulin into areas of the body that are going to be exercised. For example, avoid injecting insulin into: ? The arms, when playing tennis. ? The legs, when jogging.  Keep records of your exercise habits. Doing this can help you and your health care provider adjust your diabetes management plan as needed. Write down: ? Food that you eat before and after you exercise. ? Blood glucose levels before and after you exercise. ? The type and amount of exercise you have done. ? When your insulin is expected to peak, if you use   insulin. Avoid exercising at times when your insulin is peaking.  When you start a new exercise or activity, work with your health care provider to make sure the activity is safe for you, and to adjust your insulin, medicines, or food intake as needed.  Drink plenty of water while  you exercise to prevent dehydration or heat stroke. Drink enough fluid to keep your urine clear or pale yellow. Summary  Exercising regularly is important for your overall health, especially when you have diabetes (diabetes mellitus).  Exercising has many health benefits, such as increasing muscle strength and bone density and reducing body fat and stress.  Your health care provider or certified diabetes educator can help you make a plan for the type and frequency of exercise (activity plan) that works for you.  When you start a new exercise or activity, work with your health care provider to make sure the activity is safe for you, and to adjust your insulin, medicines, or food intake as needed. This information is not intended to replace advice given to you by your health care provider. Make sure you discuss any questions you have with your health care provider. Document Revised: 06/07/2017 Document Reviewed: 04/24/2016 Elsevier Patient Education  2020 Elsevier Inc.  

## 2020-02-04 NOTE — Telephone Encounter (Signed)
Copied from [place] [id number]. Topic: General - Other >> Feb 04, 2020 11:08 AM Yvette Rack wrote: Reason for CRM: Pt stated he has to have his glasses changed because he can not see well out of them but he was told he had to have his A1C checked before they are changed. Pt requests to be worked in with Dr. Caryn Section today. Pt requests call back

## 2020-02-04 NOTE — Progress Notes (Signed)
Patient: Craig York Male    DOB: [birth year]   69 y.o.   MRN: [id number] Visit Date: 02/04/2020  Today's Provider: Trinna Post, PA-C   Chief Complaint  Patient presents with  . Blurred Vision   Subjective:     HPI  Blurred Vision Patient presents today for blurry vision for about 3 weeks. Patient states that he went to the eye doctor today,02/04/2020 and they gave him new lens but they are worse than the ones he had before. Patient reports Dr. Gloriann Loan his eye doctor told him to come here and get an A1c drawn. His last A1c was drawn on 01/21/2020 which was 11.2. Prior to that his a1c was in the low 6 range. Patient reports blurred vision in both eyes. He denies sudden monocular vision loss. He reports increased thirst and urination. He reports in the previous few months he had been eating more sweets. He also had been drinking many power 8 drinks daily. On 01/21/2020 he was started on Jardiance by his PCP for worsening diabetes.   Lab Results  Component Value Date   HGBA1C 11.2 (A) 01/21/2020    Allergies  Allergen Reactions  . Simvastatin     Elevated CK  . Penicillins Rash    Did it involve swelling of the face/tongue/throat, SOB, or low BP? No Did it involve sudden or severe rash/hives, skin peeling, or any reaction on the inside of your mouth or nose? No Did you need to seek medical attention at a hospital or doctor's office? No When did it last happen? If all above answers are "NO", may proceed with cephalosporin use.      Current Outpatient Medications:  .  acetaminophen (TYLENOL) 500 MG tablet, Take 1,000 mg by mouth every 6 (six) hours as needed for moderate pain., Disp: , Rfl:  .  amiodarone (PACERONE) 200 MG tablet, Take 200 mg by mouth 2 (two) times daily., Disp: , Rfl:  .  aspirin 81 MG tablet, Take 81 mg by mouth daily. , Disp: , Rfl:  .  clopidogrel (PLAVIX) 75 MG tablet, Take 1 tablet (75 mg total) by mouth daily., Disp: 90 tablet, Rfl: 9 .   empagliflozin (JARDIANCE) 10 MG TABS tablet, Take 10 mg by mouth daily before breakfast., Disp: 28 tablet, Rfl: 0 .  isosorbide dinitrate (ISORDIL) 10 MG tablet, Take 1 tablet (10 mg total) by mouth 3 (three) times daily., Disp: 90 tablet, Rfl: 1 .  lisinopril (ZESTRIL) 2.5 MG tablet, Take 1 tablet by mouth daily., Disp: , Rfl:  .  metFORMIN (GLUCOPHAGE) 500 MG tablet, TAKE TWO TABLETS BY MOUTH TWICE DAILY WITH A MEAL, Disp: 360 tablet, Rfl: 1 .  metoprolol succinate (TOPROL-XL) 25 MG 24 hr tablet, Take 25 mg by mouth daily., Disp: , Rfl:  .  oxyCODONE-acetaminophen (PERCOCET) 10-325 MG tablet, Take 1 tablet by mouth every 8 (eight) hours as needed for pain., Disp: 30 tablet, Rfl: 0 .  rosuvastatin (CRESTOR) 20 MG tablet, Take 1 tablet (20 mg total) by mouth daily at 6 PM., Disp: 30 tablet, Rfl: 1 .  sildenafil (VIAGRA) 100 MG tablet, Take 0.5-1 tablets (50-100 mg total) by mouth daily as needed for erectile dysfunction., Disp: 5 tablet, Rfl: 11 .  tadalafil (CIALIS) 10 MG tablet, Take 1 tablet (10 mg total) by mouth daily as needed for erectile dysfunction., Disp: 30 tablet, Rfl: 2  Review of Systems  Social History   Tobacco Use  . Smoking status: Former  Smoker  . Smokeless tobacco: Former Systems developer    Types: Chew    Quit date: 11/27/1968  . Tobacco comment: used 2 packs per week; quit over 40 years ago  Substance Use Topics  . Alcohol use: Not Currently    Alcohol/week: 0.0 standard drinks      Objective:   BP 126/72 (BP Location: Left Arm, Patient Position: Sitting, Cuff Size: Normal)   Pulse 83   Temp (!) 97.3 F (36.3 C) (Temporal)   Wt 187 lb 3.2 oz (84.9 kg)   SpO2 99%   BMI 24.70 kg/m  Vitals:   02/04/20 1426  BP: 126/72  Pulse: 83  Temp: (!) 97.3 F (36.3 C)  TempSrc: Temporal  SpO2: 99%  Weight: 187 lb 3.2 oz (84.9 kg)  Body mass index is 24.7 kg/m.   Physical Exam Constitutional:      Appearance: Normal appearance.  Cardiovascular:     Rate and Rhythm: Normal  rate and regular rhythm.     Heart sounds: Normal heart sounds.  Pulmonary:     Effort: Pulmonary effort is normal.     Breath sounds: Normal breath sounds.  Abdominal:     General: Bowel sounds are normal.     Palpations: Abdomen is soft.  Skin:    General: Skin is warm and dry.  Neurological:     Mental Status: He is alert and oriented to person, place, and time. Mental status is at baseline.  Psychiatric:        Mood and Affect: Mood normal.        Behavior: Behavior normal.      No results found for any visits on 02/04/20.     Assessment & Plan    1. Type 2 diabetes mellitus with microalbuminuria, without long-term current use of insulin ([place])  Advised patient that his blurred vision is likely due to his worsening sugars. Advised this will likely resolve as his sugars come under control. Advised it is a little early to check his A1c and it may not be an accurate representation of his blood sugar at this point. Printed out A1c to provide his eye doctor as he follows up in one week.   The entirety of the information documented in the History of Present Illness, Review of Systems and Physical Exam were personally obtained by me. Portions of this information were initially documented by [facility] and reviewed by me for thoroughness and accuracy.   F/u PRN      Trinna Post, PA-C  [facility]

## 2020-02-19 NOTE — Progress Notes (Signed)
Patient: Craig York   DOB: 22-Nov-1951   69 y.o. Male  MRN: [id number] Visit Date: 02/20/2020  Today's Provider: Lelon Huh, MD  Subjective:    Chief Complaint  Patient presents with  . Diabetes   HPI  Diabetes Mellitus Type II, Follow-up:  Lab Results  Component Value Date   HGBA1C 11.2 (A) 01/21/2020   Last seen for diabetes 1 months ago. Patient seen Fabio Bering on 02/04/2020 for blurred vision, which he states has improved. Was thought to be related to blood sugars. Patient advised to continue medication. He had been seen 2/24 and added Jardiance 10 mg. He reports good compliance with treatment. He is not having side effects.  Current symptoms include none  Home blood sugar records: blood sugars are not checked   Episodes of hypoglycemia? no              Current Insulin Regimen: N/A Most Recent Eye Exam: 02/11/2018 Weight trend: decreasing steadily Prior visit with dietician: no Current diet: well balanced Current exercise: walking  ------------------------------------------------------------------------   Past Medical History:  Diagnosis Date  . Anginal pain ([place])   . Benign neoplasm of descending colon   . BPH (benign prostatic hyperplasia)   . Coronary artery disease   . Diabetes mellitus without complication ([place])   . Hemorrhage of gastrointestinal tract 07/23/2008  . Hyperlipidemia   . Hypertension   . Iron deficiency anemia   . Myocardial infarction ([place])   . Polyp of sigmoid colon        Medications: Outpatient Medications Prior to Visit  Medication Sig Dispense Refill  . acetaminophen (TYLENOL) 500 MG tablet Take 1,000 mg by mouth every 6 (six) hours as needed for moderate pain.    [name] amiodarone (PACERONE) 200 MG tablet Take 200 mg by mouth 2 (two) times daily.    [name] aspirin 81 MG tablet Take 81 mg by mouth daily.     . clopidogrel (PLAVIX) 75 MG tablet Take 1 tablet (75 mg total) by mouth daily. 90 tablet 9  . empagliflozin  (JARDIANCE) 10 MG TABS tablet Take 10 mg by mouth daily before breakfast. 28 tablet 0  . isosorbide dinitrate (ISORDIL) 10 MG tablet Take 1 tablet (10 mg total) by mouth 3 (three) times daily. 90 tablet 1  . lisinopril (ZESTRIL) 2.5 MG tablet Take 1 tablet by mouth daily.    . metFORMIN (GLUCOPHAGE) 500 MG tablet TAKE TWO TABLETS BY MOUTH TWICE DAILY WITH A MEAL 360 tablet 1  . metoprolol succinate (TOPROL-XL) 25 MG 24 hr tablet Take 25 mg by mouth daily.    [name] oxyCODONE-acetaminophen (PERCOCET) 10-325 MG tablet Take 1 tablet by mouth every 8 (eight) hours as needed for pain. 30 tablet 0  . rosuvastatin (CRESTOR) 20 MG tablet Take 1 tablet (20 mg total) by mouth daily at 6 PM. 30 tablet 1  . sildenafil (VIAGRA) 100 MG tablet Take 0.5-1 tablets (50-100 mg total) by mouth daily as needed for erectile dysfunction. 5 tablet 11  . tadalafil (CIALIS) 10 MG tablet Take 1 tablet (10 mg total) by mouth daily as needed for erectile dysfunction. 30 tablet 2   No facility-administered medications prior to visit.    Review of Systems  Constitutional: Negative.  Negative for appetite change, chills and fever.  Eyes: Positive for visual disturbance (blurred vision).  Respiratory: Negative.  Negative for chest tightness, shortness of breath and wheezing.   Cardiovascular: Negative.  Negative for chest pain and palpitations.  Gastrointestinal: Negative for abdominal pain, nausea and vomiting.  Musculoskeletal: Negative.        Objective:   BP 112/76 (BP Location: Left Arm, Patient Position: Sitting, Cuff Size: Normal)   Pulse 92   Temp (!) 97.1 F (36.2 C) (Temporal)   Resp 16   Wt 180 lb 9.6 oz (81.9 kg)   SpO2 99% Comment: room air  BMI 23.83 kg/m  Vitals:   02/20/20 1053  BP: 112/76  Pulse: 92  Resp: 16  Temp: (!) 97.1 F (36.2 C)  TempSrc: Temporal  SpO2: 99%  Weight: 180 lb 9.6 oz (81.9 kg)   BP Readings from Last 3 Encounters:  02/20/20 112/76  02/04/20 126/72  01/21/20 120/62       Physical Exam   General appearance: Well developed, well nourished male, cooperative and in no acute distress Head: Normocephalic, without obvious abnormality, atraumatic Respiratory: Respirations even and unlabored, normal respiratory rate Extremities: All extremities are intact.  Skin: Skin color, texture, turgor normal. No rashes seen  Psych: Appropriate mood and affect. Neurologic: Mental status: Alert, oriented to person, place, and time, thought content appropriate.     Assessment & Plan:    1. Type 2 diabetes mellitus with microalbuminuria, without long-term current use of insulin (HCC) Is tolerating restarting of low dose Jardiance.Patient states he thinks the 25mg  he took in the past worked better, although he hasn't been checking his blood sugars recently. Given 6 weeks samples of 10mg  tablets and will see how labs look. If we go up to 25 he will probably need reduction in antihypertensive medications.  - Basic metabolic panel - Hemoglobin A1c  2. Lumbar radiculopathy refill- oxyCODONE-acetaminophen (PERCOCET) 10-325 MG tablet; Take 1 tablet by mouth every 8 (eight) hours as needed for pain.  Dispense: 30 tablet; Refill: 0     Lelon Huh, MD  [facility] [phone] (phone) [phone] (fax)  [place]

## 2020-02-20 ENCOUNTER — Encounter: Payer: Self-pay | Admitting: Family Medicine

## 2020-02-20 ENCOUNTER — Ambulatory Visit (INDEPENDENT_AMBULATORY_CARE_PROVIDER_SITE_OTHER): Payer: Medicare HMO | Admitting: Family Medicine

## 2020-02-20 ENCOUNTER — Other Ambulatory Visit: Payer: Self-pay

## 2020-02-20 VITALS — BP 112/76 | HR 92 | Temp 97.1°F | Resp 16 | Wt 180.6 lb

## 2020-02-20 DIAGNOSIS — M5416 Radiculopathy, lumbar region: Secondary | ICD-10-CM

## 2020-02-20 DIAGNOSIS — R809 Proteinuria, unspecified: Secondary | ICD-10-CM | POA: Diagnosis not present

## 2020-02-20 DIAGNOSIS — E1129 Type 2 diabetes mellitus with other diabetic kidney complication: Secondary | ICD-10-CM

## 2020-02-20 MED ORDER — OXYCODONE-ACETAMINOPHEN 10-325 MG PO TABS: 1.0000 | ORAL_TABLET | Freq: Three times a day (TID) | ORAL | 0 refills | Status: DC | PRN

## 2020-02-21 LAB — BASIC METABOLIC PANEL
BUN/Creatinine Ratio: 13 (ref 10–24)
BUN: 12 mg/dL (ref 8–27)
CO2: 19 mmol/L — ABNORMAL LOW (ref 20–29)
Calcium: 10.2 mg/dL (ref 8.6–10.2)
Chloride: 105 mmol/L (ref 96–106)
Creatinine, Ser: 0.94 mg/dL (ref 0.76–1.27)
GFR calc Af Amer: 96 mL/min/{1.73_m2} (ref >59)
GFR calc non Af Amer: 83 mL/min/{1.73_m2} (ref >59)
Glucose: 117 mg/dL — ABNORMAL HIGH (ref 65–99)
Potassium: 4.1 mmol/L (ref 3.5–5.2)
Sodium: 140 mmol/L (ref 134–144)

## 2020-02-21 LAB — HEMOGLOBIN A1C
Est. average glucose Bld gHb Est-mCnc: 232 mg/dL
Hgb A1c MFr Bld: 9.7 % — ABNORMAL HIGH (ref 4.8–5.6)

## 2020-02-23 ENCOUNTER — Telehealth: Payer: Self-pay

## 2020-02-23 DIAGNOSIS — I25118 Atherosclerotic heart disease of native coronary artery with other forms of angina pectoris: Secondary | ICD-10-CM

## 2020-02-23 DIAGNOSIS — E113393 Type 2 diabetes mellitus with moderate nonproliferative diabetic retinopathy without macular edema, bilateral: Secondary | ICD-10-CM | POA: Diagnosis not present

## 2020-02-23 DIAGNOSIS — E1129 Type 2 diabetes mellitus with other diabetic kidney complication: Secondary | ICD-10-CM

## 2020-02-23 MED ORDER — JARDIANCE 25 MG PO TABS: 25.0000 mg | ORAL_TABLET | Freq: Every day | ORAL | 1 refills | Status: DC

## 2020-02-23 NOTE — Telephone Encounter (Signed)
Patient advised of labs and copy of lab results faxed to Dr. Gloriann Loan.

## 2020-02-23 NOTE — Telephone Encounter (Signed)
Patient advised and agrees with plan. He verbalized understanding Prescription for Jardiance sent to pharmacy. Follow up appointment scheduled 04/09/2020 at 11am.

## 2020-02-23 NOTE — Telephone Encounter (Signed)
-----   Message from [name], MD sent at 02/22/2020  9:39 AM EDT ----- Labs good except for high blood sugar. Can go ahead and increase Jardiance to 25mg  once a day. Can send in prescription for #30, rf x 1. This will make his blood pressure drop, so he needs to stop the lisinopril for now.  Need to schedule follow up in 6-8 weeks.

## 2020-02-23 NOTE — Telephone Encounter (Signed)
Copied from [place] [phone]. Topic: General - Other >> Feb 23, 2020  8:43 AM Rainey Pines A wrote: Patient would like his lab results faxed to Dr. Maryland Pink office at [phone] before his 11am appt today. Please advise

## 2020-03-17 ENCOUNTER — Other Ambulatory Visit: Payer: Self-pay | Admitting: Thoracic Surgery (Cardiothoracic Vascular Surgery)

## 2020-03-17 ENCOUNTER — Other Ambulatory Visit: Payer: Self-pay | Admitting: Cardiothoracic Surgery

## 2020-03-17 DIAGNOSIS — Z951 Presence of aortocoronary bypass graft: Secondary | ICD-10-CM

## 2020-03-17 DIAGNOSIS — R9389 Abnormal findings on diagnostic imaging of other specified body structures: Secondary | ICD-10-CM

## 2020-03-25 ENCOUNTER — Ambulatory Visit
Admission: RE | Admit: 2020-03-25 | Discharge: 2020-03-25 | Disposition: A | Discharge status: Home / Self Care | Payer: Medicare Other | Source: Ambulatory Visit | Attending: Thoracic Surgery (Cardiothoracic Vascular Surgery) | Admitting: Thoracic Surgery (Cardiothoracic Vascular Surgery)

## 2020-03-25 ENCOUNTER — Ambulatory Visit (INDEPENDENT_AMBULATORY_CARE_PROVIDER_SITE_OTHER): Payer: Medicare Other | Admitting: Thoracic Surgery (Cardiothoracic Vascular Surgery)

## 2020-03-25 ENCOUNTER — Other Ambulatory Visit: Payer: Self-pay

## 2020-03-25 ENCOUNTER — Encounter: Payer: Self-pay | Admitting: Thoracic Surgery (Cardiothoracic Vascular Surgery)

## 2020-03-25 VITALS — BP 118/64 | HR 92 | Temp 97.9°F | Resp 20 | Ht 73.0 in | Wt 174.0 lb

## 2020-03-25 DIAGNOSIS — J986 Disorders of diaphragm: Secondary | ICD-10-CM | POA: Diagnosis not present

## 2020-03-25 DIAGNOSIS — Z951 Presence of aortocoronary bypass graft: Secondary | ICD-10-CM

## 2020-03-25 DIAGNOSIS — R918 Other nonspecific abnormal finding of lung field: Secondary | ICD-10-CM | POA: Diagnosis not present

## 2020-03-25 DIAGNOSIS — R0602 Shortness of breath: Secondary | ICD-10-CM | POA: Diagnosis not present

## 2020-03-25 NOTE — Progress Notes (Signed)
[address]       [place] [address]             [phone]                    [name] [place] Medical Record [id number] Date of Birth: [birth year]  Referring: [name], MD Primary Care: [name], MD Primary Cardiologist: No primary care provider on file.  Chief Complaint:    Chief Complaint  Patient presents with  . Coronary Artery Disease    6 month f/u with CXR, eval right paralyzed hemidiaphragm, HX of CABG    History of Present Illness:    Craig York 69 y.o. male presents for 72-month follow-up for evaluation of his elevated right hemidiaphragm following CABG with RIMA harvest.  He states that he has become more short of breath and occasionally has some dizziness when he stands too quickly.  He continues to be active but is limited by his dyspnea.         Zubrod Score: At the time of surgery this patient's most appropriate activity status/level should be described as: [x]     0    Normal activity, no symptoms []     1    Restricted in physical strenuous activity but ambulatory, able to do out light work []     2    Ambulatory and capable of self care, unable to do work activities, up and about               >50 % of waking hours                              []     3    Only limited self care, in bed greater than 50% of waking hours []     4    Completely disabled, no self care, confined to bed or chair []     5    Moribund   Past Medical History:  Diagnosis Date  . Anginal pain ([place])   . Benign neoplasm of descending colon   . BPH (benign prostatic hyperplasia)   . Coronary artery disease   . Diabetes mellitus without complication ([place])   . Hemorrhage of gastrointestinal tract 07/23/2008  . Hyperlipidemia   . Hypertension   . Iron deficiency anemia   . Myocardial infarction ([place])   . Polyp of sigmoid colon     Past Surgical History:  Procedure Laterality Date  . CARDIAC CATHETERIZATION    . chest  surgery for fungal infection    . COLONOSCOPY WITH PROPOFOL N/A 07/10/2017   Procedure: COLONOSCOPY WITH PROPOFOL;  Surgeon: [name], MD;  Location: [facility] ENDOSCOPY;  Service: Endoscopy;  Laterality: N/A;  . CORONARY ARTERY BYPASS GRAFT N/A 08/12/2019   Procedure: CORONARY ARTERY BYPASS GRAFTING (CABG)X 4;  Surgeon: [name], MD;  Location: [place];  Service: Open Heart Surgery;  Laterality: N/A;  CABG x  4  using bilateral internal mammary arteries and endoscopically harvested left saphenous vein  . ESOPHAGOGASTRODUODENOSCOPY (EGD) WITH PROPOFOL N/A 07/10/2017   Procedure: ESOPHAGOGASTRODUODENOSCOPY (EGD) WITH PROPOFOL;  Surgeon: [name], MD;  Location[facility] ENDOSCOPY;  Service: Endoscopy;  Laterality: N/A;  . HERNIA REPAIR    . LEFT HEART CATH AND CORONARY ANGIOGRAPHY Left 07/21/2019   Procedure: LEFT HEART CATH AND CORONARY ANGIOGRAPHY;  Surgeon: [name], MD;  Location: [facility]  INVASIVE CV LAB;  Service: Cardiovascular;  Laterality: Left;  . TEE WITHOUT CARDIOVERSION N/A 08/12/2019   Procedure: TRANSESOPHAGEAL ECHOCARDIOGRAM (TEE);  Surgeon: [name], MD;  Location: [place];  Service: Open Heart Surgery;  Laterality: N/A;    Family History  Problem Relation Age of Onset  . Hypertension Mother      Social History   Tobacco Use  Smoking Status Former Smoker  Smokeless Tobacco Former Systems developer  . Types: Chew  . Quit date: 11/27/1968  Tobacco Comment   used 2 packs per week; quit over 40 years ago    Social History   Substance and Sexual Activity  Alcohol Use Not Currently  . Alcohol/week: 0.0 standard drinks     Allergies  Allergen Reactions  . Simvastatin     Elevated CK  . Penicillins Rash    Did it involve swelling of the face/tongue/throat, SOB, or low BP? No Did it involve sudden or severe rash/hives, skin peeling, or any reaction on the inside of your mouth or nose? No Did you need to seek medical attention at a hospital or doctor's office?  No When did it last happen? If all above answers are "NO", may proceed with cephalosporin use.     Current Outpatient Medications  Medication Sig Dispense Refill  . acetaminophen (TYLENOL) 500 MG tablet Take 1,000 mg by mouth every 6 (six) hours as needed for moderate pain.    [name] amiodarone (PACERONE) 200 MG tablet Take 200 mg by mouth 2 (two) times daily.    [name] aspirin 81 MG tablet Take 81 mg by mouth daily.     . clopidogrel (PLAVIX) 75 MG tablet Take 1 tablet (75 mg total) by mouth daily. 90 tablet 9  . empagliflozin (JARDIANCE) 25 MG TABS tablet Take 25 mg by mouth daily before breakfast. 30 tablet 1  . isosorbide dinitrate (ISORDIL) 10 MG tablet Take 1 tablet (10 mg total) by mouth 3 (three) times daily. 90 tablet 1  . metFORMIN (GLUCOPHAGE) 500 MG tablet TAKE TWO TABLETS BY MOUTH TWICE DAILY WITH A MEAL 360 tablet 1  . metoprolol succinate (TOPROL-XL) 25 MG 24 hr tablet Take 25 mg by mouth daily.    [name] oxyCODONE-acetaminophen (PERCOCET) 10-325 MG tablet Take 1 tablet by mouth every 8 (eight) hours as needed for pain. 30 tablet 0  . rosuvastatin (CRESTOR) 20 MG tablet Take 1 tablet (20 mg total) by mouth daily at 6 PM. 30 tablet 1  . sildenafil (VIAGRA) 100 MG tablet Take 0.5-1 tablets (50-100 mg total) by mouth daily as needed for erectile dysfunction. 5 tablet 11  . tadalafil (CIALIS) 10 MG tablet Take 1 tablet (10 mg total) by mouth daily as needed for erectile dysfunction. 30 tablet 2   No current facility-administered medications for this visit.    Review of Systems  Constitutional: Negative for weight loss.  Respiratory: Positive for shortness of breath.   Cardiovascular: Negative for chest pain.     PHYSICAL EXAMINATION: BP 118/64   Pulse 92   Temp 97.9 F (36.6 C) (Skin)   Resp 20   Ht 6\' 1"  (1.854 m)   Wt 174 lb (78.9 kg)   SpO2 98% Comment: RA  BMI 22.96 kg/m  Physical Exam  Constitutional: He is oriented to person, place, and time. He appears  well-developed and well-nourished. No distress.  HENT:  Head: Normocephalic.  Eyes: Conjunctivae are normal.  Neck: No tracheal deviation present.  Cardiovascular: Normal rate.  Respiratory: Effort normal. No respiratory  distress.  GI: He exhibits no distension.  Musculoskeletal:        General: Normal range of motion.  Neurological: He is alert and oriented to person, place, and time.  Skin: Skin is warm and dry.    Diagnostic Studies & Laboratory data:     Recent Radiology Findings:   DG Chest 2 View  Result Date: 03/25/2020 CLINICAL DATA:  Follow-up CABG 08/12/2019. Patient reports shortness of breath. EXAM: CHEST - 2 VIEW COMPARISON:  Radiograph 11/09/2019 FINDINGS: Post median sternotomy and CABG. Mild elevation of right hemidiaphragm, unchanged. Improved streaky bibasilar opacities from prior exam. Heart is normal in size. There is no pulmonary edema, focal consolidation, pleural effusion or pneumothorax. No acute osseous abnormalities are seen. IMPRESSION: 1. Post CABG with normal heart size. Improved bibasilar opacities from prior exam. 2. Unchanged elevation of right hemidiaphragm. Electronically Signed   By: Keith Rake M.D.   On: 03/25/2020 16:01       I have independently reviewed the above radiology studies  and reviewed the findings with the patient.   Recent Lab Findings:      Assessment / Plan:   69 year old male paralyzed diaphragm following CABG.  He has now become symptomatic.  I will obtain another sniff test for comparison given that he had one back in October 2020 as well as pulmonary function testing.  He is agreeable to proceed with a robotic assisted right diaphragm plication tentatively scheduled for May 24.  He will need to stop his Plavix 5 days prior to surgery.     I  spent 20 minutes with  the patient face to face and greater then 50% of the time was spent in counseling and coordination of care.    Lajuana Matte 03/25/2020 5:54  PM

## 2020-03-26 ENCOUNTER — Other Ambulatory Visit: Payer: Self-pay | Admitting: *Deleted

## 2020-03-26 ENCOUNTER — Other Ambulatory Visit: Payer: Self-pay | Admitting: Family Medicine

## 2020-03-26 ENCOUNTER — Encounter: Payer: Self-pay | Admitting: *Deleted

## 2020-03-26 ENCOUNTER — Ambulatory Visit: Payer: Medicare Other | Admitting: Thoracic Surgery (Cardiothoracic Vascular Surgery)

## 2020-03-26 DIAGNOSIS — J986 Disorders of diaphragm: Secondary | ICD-10-CM

## 2020-03-26 DIAGNOSIS — M5416 Radiculopathy, lumbar region: Secondary | ICD-10-CM

## 2020-03-26 MED ORDER — OXYCODONE-ACETAMINOPHEN 10-325 MG PO TABS: 1.0000 | ORAL_TABLET | Freq: Three times a day (TID) | ORAL | 0 refills | Status: DC | PRN

## 2020-03-26 NOTE — Telephone Encounter (Signed)
Requested medication (s) are due for refill today: yes  Requested medication (s) are on the active medication list: yes  Last refill:  02/20/2020  Future visit scheduled: yes  Notes to clinic: this refill cannot be delegated    Requested Prescriptions  Pending Prescriptions Disp Refills   oxyCODONE-acetaminophen (PERCOCET) 10-325 MG tablet 30 tablet 0    Sig: Take 1 tablet by mouth every 8 (eight) hours as needed for pain.      Not Delegated - Analgesics:  Opioid Agonist Combinations Failed - 03/26/2020  2:14 PM      Failed - This refill cannot be delegated      Failed - Urine Drug Screen completed in last 360 days.      Passed - Valid encounter within last 6 months    Recent Outpatient Visits           1 month ago Type 2 diabetes mellitus with microalbuminuria, without long-term current use of insulin [facility])   [facility] [name], MD   1 month ago Type 2 diabetes mellitus with microalbuminuria, without long-term current use of insulin [facility])   [facility] [place], [place], PA-C   2 months ago Type 2 diabetes mellitus with microalbuminuria, without long-term current use of insulin [facility])   [facility] [name], MD   4 months ago Shortness of breath   [place], [place], [place]   4 months ago Bursitis of hip, unspecified bursa, unspecified laterality   [facility] [name], MD       Future Appointments             In 2 weeks Fisher, Kirstie Peri, MD [facility]

## 2020-03-26 NOTE — Telephone Encounter (Signed)
Pt is requesting a refill for oxyCODONE-acetaminophen (PERCOCET) 10-325 MG tablet    Pharmacy:  [facility] [address] Phone:  [phone]  Fax:  [phone]

## 2020-04-08 ENCOUNTER — Encounter (HOSPITAL_COMMUNITY): Payer: Medicare Other

## 2020-04-08 NOTE — Progress Notes (Signed)
Established patient visit   Patient: Craig York   DOB: 1951/04/08   69 y.o. Male  MRN: [id number] Visit Date: 04/09/2020  Today's healthcare provider: Lelon Huh, MD   Chief Complaint  Patient presents with  . Diabetes  . Hypertension   Subjective    HPI Diabetes Mellitus Type II, Follow-up  Lab Results  Component Value Date   HGBA1C 9.7 (H) 02/20/2020   HGBA1C 11.2 (A) 01/21/2020   HGBA1C 5.9 (H) 08/08/2019   Wt Readings from Last 3 Encounters:  04/09/20 172 lb 12.8 oz (78.4 kg)  03/25/20 174 lb (78.9 kg)  02/20/20 180 lb 9.6 oz (81.9 kg)   Last seen for diabetes 6 weeks ago.  Management since then includes advised to restart Jardiance 10 mg and possibly increase depending on lab results now taking 25 mg. He states he feels like it is working better, although he is not checking his blood sugars. He has been feeling very short of breath with exertion, but feels like this is improved since increasing dose of Jardiance.  He reports good compliance with treatment. He is not having side effects.  Symptoms: No fatigue No foot ulcerations  No appetite changes No nausea  No paresthesia of the feet  No polydipsia  No polyuria No visual disturbances   No vomiting     Home blood sugar records: fasting range: unknown  Episodes of hypoglycemia? Yes 2 or 3   Current insulin regiment: None Most Recent Eye Exam: 02/11/2018 Current exercise: walking Current diet habits: in general, a "healthy" diet    Pertinent Labs: Lab Results  Component Value Date   CHOL 137 07/01/2019   HDL 41 07/01/2019   LDLCALC 72 07/01/2019   TRIG 120 07/01/2019   CHOLHDL 3.3 07/01/2019   Lab Results  Component Value Date   NA 140 02/20/2020   K 4.1 02/20/2020   CREATININE 0.94 02/20/2020   GFRNONAA 83 02/20/2020   GFRAA 96 02/20/2020   GLUCOSE 117 (H) 02/20/2020      ---------------------------------------------------------------------------------------------------  Hypertension, follow-up  BP Readings from Last 3 Encounters:  04/09/20 122/65  03/25/20 118/64  02/20/20 112/76   Wt Readings from Last 3 Encounters:  04/09/20 172 lb 12.8 oz (78.4 kg)  03/25/20 174 lb (78.9 kg)  02/20/20 180 lb 9.6 oz (81.9 kg)     He was last seen for hypertension over 1 years ago.  BP at that visit was see above. Management since that visit includes no change.  He reports good compliance with treatment. He is not having side effects.  He is following a Regular diet. He is exercising. He does not smoke.  Use of agents associated with hypertension: none.   Outside blood pressures are being checked occasionally at the pharmacy. Symptoms: No chest pain No chest pressure  No palpitations No syncope  No dyspnea No orthopnea  No paroxysmal nocturnal dyspnea No lower extremity edema   Pertinent labs: Lab Results  Component Value Date   CHOL 137 07/01/2019   HDL 41 07/01/2019   LDLCALC 72 07/01/2019   TRIG 120 07/01/2019   CHOLHDL 3.3 07/01/2019   Lab Results  Component Value Date   NA 140 02/20/2020   K 4.1 02/20/2020   CREATININE 0.94 02/20/2020   GFRNONAA 83 02/20/2020   GFRAA 96 02/20/2020   GLUCOSE 117 (H) 02/20/2020     The ASCVD Risk score (Goff DC Jr., et al., 2013) failed to calculate for the following reasons:   The  patient has a prior MI or stroke diagnosis   ---------------------------------------------------------------------------------------------------     Medications: Outpatient Medications Prior to Visit  Medication Sig  . acetaminophen (TYLENOL) 500 MG tablet Take 1,000 mg by mouth every 6 (six) hours as needed for moderate pain.  [name] amLODipine (NORVASC) 10 MG tablet Take 10 mg by mouth daily.  [name] aspirin EC 81 MG tablet Take 81 mg by mouth daily.  . clopidogrel (PLAVIX) 75 MG tablet Take 1 tablet (75 mg total) by mouth  daily.  . empagliflozin (JARDIANCE) 25 MG TABS tablet Take 25 mg by mouth daily before breakfast.  . metFORMIN (GLUCOPHAGE) 500 MG tablet TAKE TWO TABLETS BY MOUTH TWICE DAILY WITH A MEAL (Patient taking differently: Take 1,000 mg by mouth daily. )  . metoprolol succinate (TOPROL-XL) 25 MG 24 hr tablet Take 25 mg by mouth daily.  [name] oxyCODONE-acetaminophen (PERCOCET) 10-325 MG tablet Take 1 tablet by mouth every 8 (eight) hours as needed for pain. (Patient taking differently: Take 1 tablet by mouth every 8 (eight) hours as needed for pain (feet/leg pain.). )  . rosuvastatin (CRESTOR) 20 MG tablet Take 1 tablet (20 mg total) by mouth daily at 6 PM. (Patient taking differently: Take 20 mg by mouth daily. )  . sildenafil (VIAGRA) 100 MG tablet Take 0.5-1 tablets (50-100 mg total) by mouth daily as needed for erectile dysfunction.  Carren Rang COMPLETE 0.6 % SOLN Place 1 drop into both eyes 3 (three) times daily as needed (dry/irritated eyes.).  [name] tadalafil (CIALIS) 10 MG tablet Take 1 tablet (10 mg total) by mouth daily as needed for erectile dysfunction.  . isosorbide dinitrate (ISORDIL) 10 MG tablet Take 1 tablet (10 mg total) by mouth 3 (three) times daily. (Patient not taking: Reported on 04/01/2020)   No facility-administered medications prior to visit.    Review of Systems  Constitutional: Negative.   Respiratory: Positive for shortness of breath.   Cardiovascular: Negative.   Musculoskeletal: Negative.       Objective    BP 122/65 (BP Location: Right Arm, Patient Position: Sitting, Cuff Size: Normal)   Pulse 81   Temp (!) 96.9 F (36.1 C) (Temporal)   Wt 172 lb 12.8 oz (78.4 kg)   BMI 22.80 kg/m    Physical Exam   General: Appearance:    Well developed, well nourished male in no acute distress  Eyes:    PERRL, conjunctiva/corneas clear, EOM's intact       Lungs:     Clear to auscultation bilaterally, respirations unlabored  Heart:    Normal heart rate. Normal rhythm. No murmurs,  rubs, or gallops.   MS:   All extremities are intact.   Neurologic:   Awake, alert, oriented x 3. No apparent focal neurological           defect.       A1c errored: No enough hemoglobin  Assessment & Plan    1. Type 2 diabetes mellitus with microalbuminuria, without long-term current use of insulin (HCC) Doing well with increased dose of Jardiance.  - Hemoglobin A1c  2. Essential (primary) hypertension Well controlled.  Continue current medications.   - Comprehensive metabolic panel - TSH  3. Iron deficiency anemia, unspecified iron deficiency anemia type He states he stopped iron supplements due to constipation.  - CBC - Iron, TIBC and Ferritin Panel  4. Shortness of breath Suspect secondary to anemia.   5. Prostate cancer screening  - PSA Total (Reflex To Free) ([facility] only)  The entirety of the information documented in the History of Present Illness, Review of Systems and Physical Exam were personally obtained by me. Portions of this information were initially documented by the CMA and reviewed by me for thoroughness and accuracy.      Lelon Huh, MD  [facility] [phone] (phone) [phone] (fax)  [place] \

## 2020-04-09 ENCOUNTER — Other Ambulatory Visit (HOSPITAL_COMMUNITY): Payer: Medicare Other

## 2020-04-09 ENCOUNTER — Encounter: Payer: Self-pay | Admitting: Family Medicine

## 2020-04-09 ENCOUNTER — Other Ambulatory Visit: Payer: Self-pay

## 2020-04-09 ENCOUNTER — Other Ambulatory Visit: Admission: RE | Admit: 2020-04-09 | Payer: Medicare Other | Source: Ambulatory Visit

## 2020-04-09 ENCOUNTER — Ambulatory Visit (INDEPENDENT_AMBULATORY_CARE_PROVIDER_SITE_OTHER): Payer: Medicare Other | Admitting: Family Medicine

## 2020-04-09 VITALS — BP 122/65 | HR 81 | Temp 96.9°F | Wt 172.8 lb

## 2020-04-09 DIAGNOSIS — R809 Proteinuria, unspecified: Secondary | ICD-10-CM | POA: Diagnosis not present

## 2020-04-09 DIAGNOSIS — I10 Essential (primary) hypertension: Secondary | ICD-10-CM

## 2020-04-09 DIAGNOSIS — R0602 Shortness of breath: Secondary | ICD-10-CM | POA: Diagnosis not present

## 2020-04-09 DIAGNOSIS — Z125 Encounter for screening for malignant neoplasm of prostate: Secondary | ICD-10-CM

## 2020-04-09 DIAGNOSIS — E1129 Type 2 diabetes mellitus with other diabetic kidney complication: Secondary | ICD-10-CM

## 2020-04-09 DIAGNOSIS — D509 Iron deficiency anemia, unspecified: Secondary | ICD-10-CM | POA: Diagnosis not present

## 2020-04-12 ENCOUNTER — Encounter: Payer: Self-pay | Admitting: Emergency Medicine

## 2020-04-12 ENCOUNTER — Other Ambulatory Visit: Payer: Medicare Other

## 2020-04-12 ENCOUNTER — Inpatient Hospital Stay (HOSPITAL_COMMUNITY): Admission: RE | Admit: 2020-04-12 | Payer: Medicare Other | Source: Ambulatory Visit

## 2020-04-12 ENCOUNTER — Telehealth: Payer: Self-pay

## 2020-04-12 ENCOUNTER — Other Ambulatory Visit: Payer: Self-pay

## 2020-04-12 ENCOUNTER — Observation Stay
Admission: EM | Admit: 2020-04-12 | Discharge: 2020-04-14 | Disposition: A | Discharge status: Home / Self Care | Payer: Medicare Other | Attending: Internal Medicine | Admitting: Internal Medicine

## 2020-04-12 DIAGNOSIS — D500 Iron deficiency anemia secondary to blood loss (chronic): Secondary | ICD-10-CM | POA: Diagnosis not present

## 2020-04-12 DIAGNOSIS — Z8249 Family history of ischemic heart disease and other diseases of the circulatory system: Secondary | ICD-10-CM | POA: Diagnosis not present

## 2020-04-12 DIAGNOSIS — Z87891 Personal history of nicotine dependence: Secondary | ICD-10-CM | POA: Insufficient documentation

## 2020-04-12 DIAGNOSIS — Z20822 Contact with and (suspected) exposure to covid-19: Secondary | ICD-10-CM | POA: Diagnosis not present

## 2020-04-12 DIAGNOSIS — Z951 Presence of aortocoronary bypass graft: Secondary | ICD-10-CM | POA: Insufficient documentation

## 2020-04-12 DIAGNOSIS — I252 Old myocardial infarction: Secondary | ICD-10-CM | POA: Diagnosis not present

## 2020-04-12 DIAGNOSIS — E119 Type 2 diabetes mellitus without complications: Secondary | ICD-10-CM | POA: Diagnosis not present

## 2020-04-12 DIAGNOSIS — D649 Anemia, unspecified: Secondary | ICD-10-CM | POA: Diagnosis present

## 2020-04-12 DIAGNOSIS — E1129 Type 2 diabetes mellitus with other diabetic kidney complication: Secondary | ICD-10-CM

## 2020-04-12 DIAGNOSIS — Z79899 Other long term (current) drug therapy: Secondary | ICD-10-CM | POA: Diagnosis not present

## 2020-04-12 DIAGNOSIS — K641 Second degree hemorrhoids: Secondary | ICD-10-CM | POA: Diagnosis not present

## 2020-04-12 DIAGNOSIS — I25118 Atherosclerotic heart disease of native coronary artery with other forms of angina pectoris: Secondary | ICD-10-CM | POA: Diagnosis present

## 2020-04-12 DIAGNOSIS — M5416 Radiculopathy, lumbar region: Secondary | ICD-10-CM | POA: Insufficient documentation

## 2020-04-12 DIAGNOSIS — Z794 Long term (current) use of insulin: Secondary | ICD-10-CM | POA: Insufficient documentation

## 2020-04-12 DIAGNOSIS — I10 Essential (primary) hypertension: Secondary | ICD-10-CM | POA: Diagnosis present

## 2020-04-12 DIAGNOSIS — Z86010 Personal history of colonic polyps: Secondary | ICD-10-CM | POA: Diagnosis not present

## 2020-04-12 DIAGNOSIS — Z880 Allergy status to penicillin: Secondary | ICD-10-CM | POA: Insufficient documentation

## 2020-04-12 DIAGNOSIS — J986 Disorders of diaphragm: Secondary | ICD-10-CM

## 2020-04-12 DIAGNOSIS — I259 Chronic ischemic heart disease, unspecified: Secondary | ICD-10-CM

## 2020-04-12 DIAGNOSIS — E440 Moderate protein-calorie malnutrition: Secondary | ICD-10-CM | POA: Diagnosis not present

## 2020-04-12 DIAGNOSIS — R809 Proteinuria, unspecified: Secondary | ICD-10-CM | POA: Diagnosis not present

## 2020-04-12 DIAGNOSIS — Z7902 Long term (current) use of antithrombotics/antiplatelets: Secondary | ICD-10-CM | POA: Diagnosis not present

## 2020-04-12 DIAGNOSIS — K2940 Chronic atrophic gastritis without bleeding: Secondary | ICD-10-CM | POA: Diagnosis not present

## 2020-04-12 DIAGNOSIS — I2510 Atherosclerotic heart disease of native coronary artery without angina pectoris: Secondary | ICD-10-CM | POA: Diagnosis not present

## 2020-04-12 DIAGNOSIS — Z7982 Long term (current) use of aspirin: Secondary | ICD-10-CM | POA: Diagnosis not present

## 2020-04-12 DIAGNOSIS — E785 Hyperlipidemia, unspecified: Secondary | ICD-10-CM | POA: Diagnosis not present

## 2020-04-12 DIAGNOSIS — D509 Iron deficiency anemia, unspecified: Secondary | ICD-10-CM | POA: Diagnosis not present

## 2020-04-12 DIAGNOSIS — R0602 Shortness of breath: Secondary | ICD-10-CM | POA: Diagnosis not present

## 2020-04-12 DIAGNOSIS — N400 Enlarged prostate without lower urinary tract symptoms: Secondary | ICD-10-CM | POA: Diagnosis not present

## 2020-04-12 DIAGNOSIS — Z888 Allergy status to other drugs, medicaments and biological substances status: Secondary | ICD-10-CM | POA: Diagnosis not present

## 2020-04-12 HISTORY — DX: Anemia, unspecified: D64.9

## 2020-04-12 LAB — CBC WITH DIFFERENTIAL/PLATELET
Abs Immature Granulocytes: 0.03 10*3/uL (ref 0.00–0.07)
Basophils Absolute: 0 10*3/uL (ref 0.0–0.1)
Basophils Relative: 1 %
Eosinophils Absolute: 0 10*3/uL (ref 0.0–0.5)
Eosinophils Relative: 1 %
HCT: 15 % — ABNORMAL LOW (ref 39.0–52.0)
Hemoglobin: 3.9 g/dL — CL (ref 13.0–17.0)
Immature Granulocytes: 1 %
Lymphocytes Relative: 16 %
Lymphs Abs: 1 10*3/uL (ref 0.7–4.0)
MCH: 16.1 pg — ABNORMAL LOW (ref 26.0–34.0)
MCHC: 26 g/dL — ABNORMAL LOW (ref 30.0–36.0)
MCV: 62 fL — ABNORMAL LOW (ref 80.0–100.0)
Monocytes Absolute: 0.4 10*3/uL (ref 0.1–1.0)
Monocytes Relative: 6 %
Neutro Abs: 4.8 10*3/uL (ref 1.7–7.7)
Neutrophils Relative %: 75 %
Platelets: 336 10*3/uL (ref 150–400)
RBC: 2.42 MIL/uL — ABNORMAL LOW (ref 4.22–5.81)
RDW: 19.5 % — ABNORMAL HIGH (ref 11.5–15.5)
WBC: 6.3 10*3/uL (ref 4.0–10.5)
nRBC: 0 % (ref 0.0–0.2)

## 2020-04-12 LAB — GLUCOSE, CAPILLARY
Glucose-Capillary: 115 mg/dL — ABNORMAL HIGH (ref 70–99)
Glucose-Capillary: 100 mg/dL — ABNORMAL HIGH (ref 70–99)

## 2020-04-12 LAB — CBC
Hematocrit: 15.3 % — CL (ref 37.5–51.0)
Hemoglobin: 3.9 g/dL — CL (ref 13.0–17.7)
MCH: 16.2 pg — ABNORMAL LOW (ref 26.6–33.0)
MCHC: 25.5 g/dL — ABNORMAL LOW (ref 31.5–35.7)
MCV: 64 fL — ABNORMAL LOW (ref 79–97)
Platelets: 296 10*3/uL (ref 150–450)
RBC: 2.41 x10E6/uL — CL (ref 4.14–5.80)
RDW: 17.7 % — ABNORMAL HIGH (ref 11.6–15.4)
WBC: 5.7 10*3/uL (ref 3.4–10.8)

## 2020-04-12 LAB — COMPREHENSIVE METABOLIC PANEL
ALT: 6 IU/L (ref 0–44)
ALT: 9 U/L (ref 0–44)
AST: 8 IU/L (ref 0–40)
AST: 16 U/L (ref 15–41)
Albumin/Globulin Ratio: 1.5 (ref 1.2–2.2)
Albumin: 4.3 g/dL (ref 3.8–4.8)
Albumin: 4 g/dL (ref 3.5–5.0)
Alkaline Phosphatase: 82 IU/L (ref 39–117)
Alkaline Phosphatase: 65 U/L (ref 38–126)
Anion gap: 10 (ref 5–15)
BUN/Creatinine Ratio: 15 (ref 10–24)
BUN: 14 mg/dL (ref 8–27)
BUN: 12 mg/dL (ref 8–23)
Bilirubin Total: 0.5 mg/dL (ref 0.0–1.2)
CO2: 18 mmol/L — ABNORMAL LOW (ref 20–29)
CO2: 21 mmol/L — ABNORMAL LOW (ref 22–32)
Calcium: 9.2 mg/dL (ref 8.6–10.2)
Calcium: 9.2 mg/dL (ref 8.9–10.3)
Chloride: 107 mmol/L — ABNORMAL HIGH (ref 96–106)
Chloride: 109 mmol/L (ref 98–111)
Creatinine, Ser: 0.93 mg/dL (ref 0.76–1.27)
Creatinine, Ser: 0.85 mg/dL (ref 0.61–1.24)
GFR calc Af Amer: 97 mL/min/{1.73_m2} (ref >59)
GFR calc Af Amer: >60 mL/min (ref >60)
GFR calc non Af Amer: 84 mL/min/{1.73_m2} (ref >59)
GFR calc non Af Amer: >60 mL/min (ref >60)
Globulin, Total: 2.9 g/dL (ref 1.5–4.5)
Glucose, Bld: 188 mg/dL — ABNORMAL HIGH (ref 70–99)
Glucose: 97 mg/dL (ref 65–99)
Potassium: 4.3 mmol/L (ref 3.5–5.2)
Potassium: 3.9 mmol/L (ref 3.5–5.1)
Sodium: 141 mmol/L (ref 134–144)
Sodium: 140 mmol/L (ref 135–145)
Total Bilirubin: 0.8 mg/dL (ref 0.3–1.2)
Total Protein: 7.2 g/dL (ref 6.0–8.5)
Total Protein: 7.8 g/dL (ref 6.5–8.1)

## 2020-04-12 LAB — TSH: TSH: 1.04 u[IU]/mL (ref 0.450–4.500)

## 2020-04-12 LAB — SARS CORONAVIRUS 2 BY RT PCR (HOSPITAL ORDER, PERFORMED IN ~~LOC~~ HOSPITAL LAB): SARS Coronavirus 2: NEGATIVE

## 2020-04-12 LAB — PSA TOTAL (REFLEX TO FREE): Prostate Specific Ag, Serum: 2.9 ng/mL (ref 0.0–4.0)

## 2020-04-12 LAB — IRON,TIBC AND FERRITIN PANEL
Ferritin: 3 ng/mL — ABNORMAL LOW (ref 30–400)
Iron Saturation: 2 % — CL (ref 15–55)
Iron: 8 ug/dL — CL (ref 38–169)
Total Iron Binding Capacity: 520 ug/dL — ABNORMAL HIGH (ref 250–450)
UIBC: 512 ug/dL — ABNORMAL HIGH (ref 111–343)

## 2020-04-12 LAB — HEMOGLOBIN AND HEMATOCRIT, BLOOD
HCT: 21 % — ABNORMAL LOW (ref 39.0–52.0)
Hemoglobin: 6.2 g/dL — ABNORMAL LOW (ref 13.0–17.0)

## 2020-04-12 LAB — HEMOGLOBIN A1C
Est. average glucose Bld gHb Est-mCnc: 154 mg/dL
Hgb A1c MFr Bld: 7 % — ABNORMAL HIGH (ref 4.8–5.6)

## 2020-04-12 LAB — HIV ANTIBODY (ROUTINE TESTING W REFLEX): HIV Screen 4th Generation wRfx: NONREACTIVE

## 2020-04-12 LAB — ABO/RH: ABO/RH(D): A POS

## 2020-04-12 LAB — PREPARE RBC (CROSSMATCH)

## 2020-04-12 MED ORDER — FUROSEMIDE 10 MG/ML IJ SOLN
20.0000 mg | Freq: Once | INTRAMUSCULAR | Status: AC
  Administered 2020-04-12: 20 mg via INTRAVENOUS
  Filled 2020-04-12: qty 4

## 2020-04-12 MED ORDER — ONDANSETRON HCL 4 MG PO TABS: 4.0000 mg | ORAL_TABLET | Freq: Four times a day (QID) | ORAL | Status: DC | PRN

## 2020-04-12 MED ORDER — ROSUVASTATIN CALCIUM 20 MG PO TABS
20.0000 mg | ORAL_TABLET | Freq: Every day | ORAL | Status: DC
  Administered 2020-04-12 – 2020-04-13 (×2): 20 mg via ORAL
  Filled 2020-04-12 (×3): qty 1

## 2020-04-12 MED ORDER — SODIUM CHLORIDE 0.9% IV SOLUTION: Freq: Once | INTRAVENOUS | Status: AC

## 2020-04-12 MED ORDER — METOPROLOL SUCCINATE ER 25 MG PO TB24
25.0000 mg | ORAL_TABLET | Freq: Every day | ORAL | Status: DC
  Administered 2020-04-13 – 2020-04-14 (×2): 25 mg via ORAL
  Filled 2020-04-12 (×3): qty 1

## 2020-04-12 MED ORDER — SODIUM CHLORIDE 0.9 % IV SOLN: 10.0000 mL/h | Freq: Once | INTRAVENOUS | Status: DC

## 2020-04-12 MED ORDER — PANTOPRAZOLE SODIUM 40 MG IV SOLR
40.0000 mg | INTRAVENOUS | Status: DC
  Administered 2020-04-12: 40 mg via INTRAVENOUS
  Filled 2020-04-12: qty 40

## 2020-04-12 MED ORDER — AMLODIPINE BESYLATE 10 MG PO TABS
10.0000 mg | ORAL_TABLET | Freq: Every day | ORAL | Status: DC
  Administered 2020-04-13 – 2020-04-14 (×2): 10 mg via ORAL
  Filled 2020-04-12 (×2): qty 1

## 2020-04-12 MED ORDER — PEG 3350-KCL-NA BICARB-NACL 420 G PO SOLR
4000.0000 mL | Freq: Once | ORAL | Status: AC
  Administered 2020-04-12: 4000 mL via ORAL
  Filled 2020-04-12: qty 4000

## 2020-04-12 MED ORDER — SODIUM CHLORIDE 0.9 % IV SOLN: 250.0000 mL | INTRAVENOUS | Status: DC | PRN

## 2020-04-12 MED ORDER — POLYVINYL ALCOHOL 1.4 % OP SOLN
1.0000 [drp] | Freq: Three times a day (TID) | OPHTHALMIC | Status: DC | PRN
  Filled 2020-04-12: qty 15

## 2020-04-12 MED ORDER — SODIUM CHLORIDE 0.9% FLUSH: 3.0000 mL | INTRAVENOUS | Status: DC | PRN

## 2020-04-12 MED ORDER — PANTOPRAZOLE SODIUM 40 MG IV SOLR
40.0000 mg | Freq: Once | INTRAVENOUS | Status: AC
  Administered 2020-04-12: 40 mg via INTRAVENOUS
  Filled 2020-04-12: qty 40

## 2020-04-12 MED ORDER — INSULIN ASPART 100 UNIT/ML ~~LOC~~ SOLN
0.0000 [IU] | Freq: Three times a day (TID) | SUBCUTANEOUS | Status: DC
  Administered 2020-04-14: 2 [IU] via SUBCUTANEOUS
  Filled 2020-04-12: qty 1

## 2020-04-12 MED ORDER — ONDANSETRON HCL 4 MG/2ML IJ SOLN: 4.0000 mg | Freq: Four times a day (QID) | INTRAMUSCULAR | Status: DC | PRN

## 2020-04-12 MED ORDER — SODIUM CHLORIDE 0.9% FLUSH
3.0000 mL | Freq: Two times a day (BID) | INTRAVENOUS | Status: DC
  Administered 2020-04-12 – 2020-04-14 (×5): 3 mL via INTRAVENOUS

## 2020-04-12 NOTE — ED Triage Notes (Signed)
Patient states he has history of anemia requiring blood transfusion.  Had blood work done Friday and H/H 3.9/15.3.  Referred to ED for transfusion.  Patient states he has been feeling "so so".  STates he has been having some exertional SOB and occasional dizziness when standing.  AAOx3.  Skin warm and dry. NAD

## 2020-04-12 NOTE — H&P (Signed)
History and Physical    Craig York [id number] DOB: [birth year] DOA: 04/12/2020  PCP: [name], MD   Patient coming from: Home  I have personally briefly reviewed patient's old medical records in [place]  Chief Complaint: Weakness  HPI: Craig York is a 69 y.o. male with medical history significant for iron deficiency anemia, coronary artery disease, diabetes mellitus, BPH who presents to the emergency room for evaluation of generalized weakness, fatigue, exertional shortness of breath and lightheadedness.  Patient states that he has had bright red blood in his stools for the last 1 week.  He denies having any abdominal pain, hematemesis or passage of melena stools.  He was seen by his primary care provider and had blood work done that revealed a hemoglobin of 3.9g/dl compared to his baseline of 9.6g/dl.  His labs also revealed low iron levels as well as low iron saturation levels. Patient denies having any abdominal pain but admits to taking 500 mg of NSAIDs daily for arthritis.  He also takes aspirin 81 mg daily. He denies alcohol use.  Patient states that he has had unintentional weight loss and has lost 35 pounds in a couple of months despite having a good appetite. Patient has been evaluated in the past for iron deficiency anemia and states that he had a colonoscopy which was normal.  He has been transfused in the past for iron deficiency anemia.  ED Course: 69 year old African-American male presents to the emergency room for evaluation of generalized weakness, fatigue, shortness of breath with exertion and noted to have a hemoglobin of 3.9g/dl.  Blood transfusion has been ordered and patient will be admitted to the hospital for further evaluation.  Review of Systems: As per HPI otherwise 10 point review of systems negative.    Past Medical History:  Diagnosis Date  . Anginal pain ([place])   . Benign neoplasm of descending colon   . BPH (benign prostatic  hyperplasia)   . Coronary artery disease   . Diabetes mellitus without complication ([place])   . Hemorrhage of gastrointestinal tract 07/23/2008  . Hyperlipidemia   . Hypertension   . Iron deficiency anemia   . Myocardial infarction ([place])   . Polyp of sigmoid colon   . Symptomatic anemia 04/12/2020    Past Surgical History:  Procedure Laterality Date  . CARDIAC CATHETERIZATION    . chest surgery for fungal infection    . COLONOSCOPY WITH PROPOFOL N/A 07/10/2017   Procedure: COLONOSCOPY WITH PROPOFOL;  Surgeon: [name], MD;  Location: [facility] ENDOSCOPY;  Service: Endoscopy;  Laterality: N/A;  . CORONARY ARTERY BYPASS GRAFT N/A 08/12/2019   Procedure: CORONARY ARTERY BYPASS GRAFTING (CABG)X 4;  Surgeon: [name], MD;  Location: [place];  Service: Open Heart Surgery;  Laterality: N/A;  CABG x  4  using bilateral internal mammary arteries and endoscopically harvested left saphenous vein  . ESOPHAGOGASTRODUODENOSCOPY (EGD) WITH PROPOFOL N/A 07/10/2017   Procedure: ESOPHAGOGASTRODUODENOSCOPY (EGD) WITH PROPOFOL;  Surgeon: [name], MD;  Location: [facility] ENDOSCOPY;  Service: Endoscopy;  Laterality: N/A;  . HERNIA REPAIR    . LEFT HEART CATH AND CORONARY ANGIOGRAPHY Left 07/21/2019   Procedure: LEFT HEART CATH AND CORONARY ANGIOGRAPHY;  Surgeon: [name], MD;  Location: [place] CV LAB;  Service: Cardiovascular;  Laterality: Left;  . TEE WITHOUT CARDIOVERSION N/A 08/12/2019   Procedure: TRANSESOPHAGEAL ECHOCARDIOGRAM (TEE);  Surgeon: [name], MD;  Location: [place];  Service: Open Heart Surgery;  Laterality: N/A;  reports that he has quit smoking. He quit smokeless tobacco use about 51 years ago.  His smokeless tobacco use included chew. He reports previous alcohol use. He reports that he does not use drugs.  Allergies  Allergen Reactions  . Simvastatin     Elevated CK  . Penicillins Rash    Did it involve swelling of the face/tongue/throat, SOB, or low BP?  No Did it involve sudden or severe rash/hives, skin peeling, or any reaction on the inside of your mouth or nose? No Did you need to seek medical attention at a hospital or doctor's office? No When did it last happen? If all above answers are "NO", may proceed with cephalosporin use.     Family History  Problem Relation Age of Onset  . Hypertension Mother      Prior to Admission medications   Medication Sig Start Date End Date Taking? Authorizing Provider  acetaminophen (TYLENOL) 500 MG tablet Take 1,000 mg by mouth every 6 (six) hours as needed for moderate pain.   Yes [provider]  amLODipine (NORVASC) 10 MG tablet Take 10 mg by mouth daily.   Yes [provider]  clopidogrel (PLAVIX) 75 MG tablet Take 1 tablet (75 mg total) by mouth daily. 10/08/19  Yes [name], MD  empagliflozin (JARDIANCE) 25 MG TABS tablet Take 25 mg by mouth daily before breakfast. 02/23/20  Yes [name], MD  metFORMIN (GLUCOPHAGE) 500 MG tablet TAKE TWO TABLETS BY MOUTH TWICE DAILY WITH A MEAL Patient taking differently: Take 1,000 mg by mouth daily.  12/01/19  Yes [name], MD  metoprolol succinate (TOPROL-XL) 25 MG 24 hr tablet Take 25 mg by mouth daily. 12/25/19  Yes [provider]  oxyCODONE-acetaminophen (PERCOCET) 10-325 MG tablet Take 1 tablet by mouth every 8 (eight) hours as needed for pain. Patient taking differently: Take 1 tablet by mouth every 8 (eight) hours as needed for pain (feet/leg pain.).  03/26/20  Yes [name], MD  rosuvastatin (CRESTOR) 20 MG tablet Take 1 tablet (20 mg total) by mouth daily at 6 PM. Patient taking differently: Take 20 mg by mouth daily.  08/17/19  Yes Conte, Tessa N, PA-C  sildenafil (VIAGRA) 100 MG tablet Take 0.5-1 tablets (50-100 mg total) by mouth daily as needed for erectile dysfunction. 08/26/19  Yes [name], MD  SYSTANE COMPLETE 0.6 % SOLN Place 1 drop into both eyes 3 (three) times daily as  needed (dry/irritated eyes.).   Yes [provider]  aspirin EC 81 MG tablet Take 81 mg by mouth daily.    [provider]  tadalafil (CIALIS) 10 MG tablet Take 1 tablet (10 mg total) by mouth daily as needed for erectile dysfunction. Patient not taking: Reported on 04/12/2020 01/21/20   [name], MD    Physical Exam: Vitals:   04/12/20 0940 04/12/20 0941  BP: 109/64   Pulse: 82   Resp: 19   Temp: 98.4 F (36.9 C)   TempSrc: Oral   SpO2: 100%   Weight:  78.5 kg  Height:  6\' 1"  (1.854 m)     Vitals:   04/12/20 0940 04/12/20 0941  BP: 109/64   Pulse: 82   Resp: 19   Temp: 98.4 F (36.9 C)   TempSrc: Oral   SpO2: 100%   Weight:  78.5 kg  Height:  6\' 1"  (1.854 m)    Constitutional: NAD, alert and oriented x 3 Eyes: PERRL, lids and conjunctivae pallor  ENMT: Mucous membranes are moist.  Neck: normal, supple, no masses, no thyromegaly Respiratory: clear to auscultation bilaterally, no wheezing, no crackles. Normal respiratory effort. No accessory muscle use.  Cardiovascular: Regular rate and rhythm, no murmurs / rubs / gallops. No extremity edema. 2+ pedal pulses. No carotid bruits.  Abdomen: no tenderness, no masses palpated. No hepatosplenomegaly. Bowel sounds positive.  Musculoskeletal: no clubbing / cyanosis. No joint deformity upper and lower extremities.  Skin: no rashes, lesions, ulcers.  Neurologic: No gross focal neurologic deficit. Psychiatric: Normal mood and affect.   Labs on Admission: I have personally reviewed following labs and imaging studies  CBC: Recent Labs  Lab 04/09/20 1212 04/12/20 0953  WBC 5.7 6.3  NEUTROABS  --  4.8  HGB 3.9* 3.9*  HCT 15.3* 15.0*  MCV 64* 62.0*  PLT 296 123456   Basic Metabolic Panel: Recent Labs  Lab 04/09/20 1212 04/12/20 0953  NA 141 140  K 4.3 3.9  CL 107* 109  CO2 18* 21*  GLUCOSE 97 188*  BUN 14 12  CREATININE 0.93 0.85  CALCIUM 9.2 9.2   GFR: Estimated Creatinine Clearance:  92.4 mL/min (by C-G formula based on SCr of 0.85 mg/dL). Liver Function Tests: Recent Labs  Lab 04/09/20 1212 04/12/20 0953  AST 8 16  ALT 6 9  ALKPHOS 82 65  BILITOT 0.5 0.8  PROT 7.2 7.8  ALBUMIN 4.3 4.0   No results for input(s): LIPASE, AMYLASE in the last 168 hours. No results for input(s): AMMONIA in the last 168 hours. Coagulation Profile: No results for input(s): INR, PROTIME in the last 168 hours. Cardiac Enzymes: No results for input(s): CKTOTAL, CKMB, CKMBINDEX, TROPONINI in the last 168 hours. BNP (last 3 results) No results for input(s): PROBNP in the last 8760 hours. HbA1C: No results for input(s): HGBA1C in the last 72 hours. CBG: No results for input(s): GLUCAP in the last 168 hours. Lipid Profile: No results for input(s): CHOL, HDL, LDLCALC, TRIG, CHOLHDL, LDLDIRECT in the last 72 hours. Thyroid Function Tests: No results for input(s): TSH, T4TOTAL, FREET4, T3FREE, THYROIDAB in the last 72 hours. Anemia Panel: No results for input(s): VITAMINB12, FOLATE, FERRITIN, TIBC, IRON, RETICCTPCT in the last 72 hours. Urine analysis:    Component Value Date/Time   COLORURINE YELLOW 08/08/2019 1622   APPEARANCEUR CLEAR 08/08/2019 1622   LABSPEC 1.029 08/08/2019 1622   PHURINE 5.0 08/08/2019 1622   GLUCOSEU >=500 (A) 08/08/2019 1622   HGBUR NEGATIVE 08/08/2019 1622   BILIRUBINUR NEGATIVE 08/08/2019 1622   KETONESUR NEGATIVE 08/08/2019 1622   PROTEINUR NEGATIVE 08/08/2019 1622   NITRITE NEGATIVE 08/08/2019 1622   LEUKOCYTESUR NEGATIVE 08/08/2019 1622    Radiological Exams on Admission: No results found.  EKG: Independently reviewed.   Assessment/Plan Principal Problem:   Symptomatic anemia Active Problems:   Coronary artery disease   Diabetes mellitus, type 2 (HCC)   Essential (primary) hypertension    Symptomatic iron deficiency anemia Patient presents for evaluation of generalized weakness, exertional shortness of breath dizziness and  lightheadedness and found to have a hemoglobin of 3.9g/dl He has had bright red blood per rectum but denies having any abdominal pain Serum iron levels are low Hold aspirin and Plavix We will transfuse 3 units of packed RBC Patient will benefit from iron transfusion We will consult GI   Diabetes mellitus Place patient on a clear liquid diet Glycemic control with sliding scale coverage   Essential hypertension Continue amlodipine and metoprolol for blood pressure control   Coronary artery  disease Hold aspirin and Plavix Continue statins and beta-blockers  DVT prophylaxis: SCD  Code Status: Full Family Communication: Greater than 50% of time was spent discussing patient's condition and plan of care with him at the bedside.  All questions and concerns have been addressed. Disposition Plan: Back to previous home environment Consults called: GI    Mariska Daffin MD [facility]     04/12/2020, 12:46 PM

## 2020-04-12 NOTE — Telephone Encounter (Signed)
-----   Message from [name], MD sent at 04/12/2020  7:52 AM EDT ----- Patient has critically low hemoglobin and needs to go the ER. Attempted to contact patient and his emergency contact over the weekend, but there was no answer. Please continue to try to contact patient.

## 2020-04-12 NOTE — Consult Note (Signed)
[name], MD [facility]  [address] [place], [place] [address] Phone: [phone] Fax : [phone]  Consultation  Referring Provider:     Dr. Francine Graven Primary Care Physician:  [name], MD Primary Gastroenterologist:  Dr. Allen Norris         Reason for Consultation:     Profound anemia  Date of Admission:  04/12/2020 Date of Consultation:  04/12/2020         HPI:   Craig York is a 69 y.o. male who had seen his doctor back on the 14th of this month and had blood work that showed his iron to be 8 with his iron saturation to be 2 and a ferritin of 3.  The patient was also found to have a hemoglobin of 3.9.  The patient was contacted multiple times by his primary care provider without his phone being on therefore he did not get the message.  His wife was finally contacted and he came to the emergency department this morning.  On repeat blood work his hemoglobin was still 3.9.  The patient had an EGD and colonoscopy by me back in 2018.  He had to polyps in the colon that were removed at that time and signs of reflux without any ulcerations in the stomach.  The patient now reports that he has been having some small amount of bright red blood per rectum for the last 2 weeks but not a large amount.  He denies any NSAID use.  He also denies any black stools.  The patient denies any abdominal pain or unexplained weight loss.  He does report that he has been having some fatigue and weakness.  Past Medical History:  Diagnosis Date  . Anginal pain ([place])   . Benign neoplasm of descending colon   . BPH (benign prostatic hyperplasia)   . Coronary artery disease   . Diabetes mellitus without complication ([place])   . Hemorrhage of gastrointestinal tract 07/23/2008  . Hyperlipidemia   . Hypertension   . Iron deficiency anemia   . Myocardial infarction ([place])   . Polyp of sigmoid colon   . Symptomatic anemia 04/12/2020    Past Surgical History:  Procedure Laterality Date  . CARDIAC  CATHETERIZATION    . chest surgery for fungal infection    . COLONOSCOPY WITH PROPOFOL N/A 07/10/2017   Procedure: COLONOSCOPY WITH PROPOFOL;  Surgeon: [name], MD;  Location: [facility] ENDOSCOPY;  Service: Endoscopy;  Laterality: N/A;  . CORONARY ARTERY BYPASS GRAFT N/A 08/12/2019   Procedure: CORONARY ARTERY BYPASS GRAFTING (CABG)X 4;  Surgeon: [name], MD;  Location: [place];  Service: Open Heart Surgery;  Laterality: N/A;  CABG x  4  using bilateral internal mammary arteries and endoscopically harvested left saphenous vein  . ESOPHAGOGASTRODUODENOSCOPY (EGD) WITH PROPOFOL N/A 07/10/2017   Procedure: ESOPHAGOGASTRODUODENOSCOPY (EGD) WITH PROPOFOL;  Surgeon: [name], MD;  Location: [facility] ENDOSCOPY;  Service: Endoscopy;  Laterality: N/A;  . HERNIA REPAIR    . LEFT HEART CATH AND CORONARY ANGIOGRAPHY Left 07/21/2019   Procedure: LEFT HEART CATH AND CORONARY ANGIOGRAPHY;  Surgeon: [name], MD;  Location: [place] CV LAB;  Service: Cardiovascular;  Laterality: Left;  . TEE WITHOUT CARDIOVERSION N/A 08/12/2019   Procedure: TRANSESOPHAGEAL ECHOCARDIOGRAM (TEE);  Surgeon: [name], MD;  Location: [place];  Service: Open Heart Surgery;  Laterality: N/A;    Prior to Admission medications   Medication Sig Start Date End Date Taking? Authorizing Provider  acetaminophen (TYLENOL) 500 MG tablet Take 1,000 mg by mouth every 6 (six) hours as needed for moderate pain.   Yes [provider]  amLODipine (NORVASC) 10 MG tablet Take 10 mg by mouth daily.   Yes [provider]  aspirin EC 81 MG tablet Take 81 mg by mouth daily.   Yes [provider]  clopidogrel (PLAVIX) 75 MG tablet Take 1 tablet (75 mg total) by mouth daily. 10/08/19  Yes [name], MD  empagliflozin (JARDIANCE) 25 MG TABS tablet Take 25 mg by mouth daily before breakfast. 02/23/20  Yes [name], MD  metFORMIN (GLUCOPHAGE) 500 MG tablet TAKE TWO TABLETS BY MOUTH TWICE DAILY  WITH A MEAL Patient taking differently: Take 1,000 mg by mouth daily.  12/01/19  Yes [name], MD  metoprolol succinate (TOPROL-XL) 25 MG 24 hr tablet Take 25 mg by mouth daily. 12/25/19  Yes [provider]  oxyCODONE-acetaminophen (PERCOCET) 10-325 MG tablet Take 1 tablet by mouth every 8 (eight) hours as needed for pain. Patient taking differently: Take 1 tablet by mouth every 8 (eight) hours as needed for pain (feet/leg pain.).  03/26/20  Yes [name], MD  rosuvastatin (CRESTOR) 20 MG tablet Take 1 tablet (20 mg total) by mouth daily at 6 PM. Patient taking differently: Take 20 mg by mouth daily.  08/17/19  Yes Conte, Tessa N, PA-C  sildenafil (VIAGRA) 100 MG tablet Take 0.5-1 tablets (50-100 mg total) by mouth daily as needed for erectile dysfunction. 08/26/19  Yes [name], MD  SYSTANE COMPLETE 0.6 % SOLN Place 1 drop into both eyes 3 (three) times daily as needed (dry/irritated eyes.).   Yes [provider]  tadalafil (CIALIS) 10 MG tablet Take 1 tablet (10 mg total) by mouth daily as needed for erectile dysfunction. Patient not taking: Reported on 04/12/2020 01/21/20   [name], MD    Family History  Problem Relation Age of Onset  . Hypertension Mother      Social History   Tobacco Use  . Smoking status: Former Research scientist (life sciences)  . Smokeless tobacco: Former Systems developer    Types: Chew    Quit date: 11/27/1968  . Tobacco comment: used 2 packs per week; quit over 40 years ago  Substance Use Topics  . Alcohol use: Not Currently    Alcohol/week: 0.0 standard drinks  . Drug use: No    Allergies as of 04/12/2020 - Review Complete 04/12/2020  Allergen Reaction Noted  . Simvastatin  06/04/2015  . Penicillins Rash 06/04/2015    Review of Systems:    All systems reviewed and negative except where noted in HPI.   Physical Exam:  Vital signs in last 24 hours: Temp:  [97.9 F (36.6 C)-98.4 F (36.9 C)] 97.9 F (36.6 C) (05/17 1333) Pulse Rate:  [66-82]  66 (05/17 1400) Resp:  [14-22] 15 (05/17 1400) BP: (109-134)/(64-72) 119/72 (05/17 1400) SpO2:  [95 %-100 %] 100 % (05/17 1400) Weight:  [78.5 kg] 78.5 kg (05/17 0941)   General:   Pleasant, cooperative in NAD Head:  Normocephalic and atraumatic. Eyes:   No icterus.   Conjunctiva pink. PERRLA. Ears:  Normal auditory acuity. Neck:  Supple; no masses or thyroidomegaly Lungs: Respirations even and unlabored. Lungs clear to auscultation bilaterally.   No wheezes, crackles, or rhonchi.  Heart:  Regular rate and rhythm;  Without murmur, clicks, rubs or gallops Abdomen:  Soft, nondistended, nontender. Normal bowel sounds. No appreciable masses or hepatomegaly.  No rebound  or guarding.  Rectal:  Not performed. Msk:  Symmetrical without gross deformities.    Extremities:  Without edema, cyanosis or clubbing. Neurologic:  Alert and oriented x3;  grossly normal neurologically. Skin:  Intact without significant lesions or rashes. Cervical Nodes:  No significant cervical adenopathy. Psych:  Alert and cooperative. Normal affect.  LAB RESULTS: Recent Labs    04/12/20 0953  WBC 6.3  HGB 3.9*  HCT 15.0*  PLT 336   BMET Recent Labs    04/12/20 0953  NA 140  K 3.9  CL 109  CO2 21*  GLUCOSE 188*  BUN 12  CREATININE 0.85  CALCIUM 9.2   LFT Recent Labs    04/12/20 0953  PROT 7.8  ALBUMIN 4.0  AST 16  ALT 9  ALKPHOS 65  BILITOT 0.8   PT/INR No results for input(s): LABPROT, INR in the last 72 hours.  STUDIES: No results found.    Impression / Plan:   Assessment: Principal Problem:   Symptomatic anemia Active Problems:   Coronary artery disease   Diabetes mellitus, type 2 ([place])   Essential (primary) hypertension   Craig York is a 69 y.o. y/o male with profound anemia that is symptomatic.  The patient has significantly low iron levels in addition to his anemia.  Plan: The patient will be set up for an EGD and colonoscopy for tomorrow.  The patient has been  explained the plan and agrees with it.  He will be given a prep today and be kept n.p.o. after midnight.  Thank you for involving me in the care of this patient.      LOS: 0 days   [name], MD  04/12/2020, 2:43 PM Pager [phone] 7am-5pm  Check AMION for 5pm -7am coverage and on weekends   Note: This dictation was prepared with Dragon dictation along with smaller phrase technology. Any transcriptional errors that result from this process are unintentional.

## 2020-04-12 NOTE — Telephone Encounter (Signed)
Attempted to contact patient, and patient's wife, no answer left a voicemail. Contacted patiwent's daughter Caren Griffins. Daughter advised. She states they received the voicemail this weekend advising him to go to the ER. Daughter will get patient to call back this morning to let us know which hospital he is going to so we can call to advise the ER.

## 2020-04-12 NOTE — ED Provider Notes (Signed)
[facility] Emergency Department Provider Note  ____________________________________________   First MD Initiated Contact with Patient 04/12/20 1011     (approximate)  I have reviewed the triage vital signs and the nursing notes.  History  Chief Complaint Anemia    HPI Craig York is a 69 y.o. male who presents to the emergency department for abnormal labs.  Patient was seen by his PCP on 5/14, and had blood work done that revealed a hemoglobin of 3.9 from 9.6 previously. Iron levels and iron saturation markedly low as well.  Patient states he has required transfusions in the past for low blood levels.  Patient states he has felt generally fatigued, some exertional SOB, and a little bit lightheaded with positional changes over the last few weeks.  He says he has intermittently seen some blood in his stool, but denies any black or melanotic stools.  No blood in the urine.  No bloody emesis.  Otherwise, denies any acute complaints at this time.  States he used to be on an iron pill, but has not been on this for quite some time.  Does take 81 mg ASA daily and states he takes 500 mg daily of an NSAID to help with arthritic type pain in his hands.  Denies any other heavy NSAID use.  No heavy alcohol use.   Past Medical Hx Past Medical History:  Diagnosis Date  . Anginal pain ([place])   . Benign neoplasm of descending colon   . BPH (benign prostatic hyperplasia)   . Coronary artery disease   . Diabetes mellitus without complication ([place])   . Hemorrhage of gastrointestinal tract 07/23/2008  . Hyperlipidemia   . Hypertension   . Iron deficiency anemia   . Myocardial infarction ([place])   . Polyp of sigmoid colon     Problem List Patient Active Problem List   Diagnosis Date Noted  . Myocardial infarction involving left anterior descending (LAD) coronary artery ([place]) 12/25/2019  . Personal history of covid-19 11/07/2019  . S/P CABG x 4 08/12/2019  . History  of adenomatous polyp of colon 07/05/2019  . Gastric polyp   . Iron (Fe) deficiency anemia 04/19/2017  . Fracture of metatarsal bone, right sequela 06/04/2015  . H/O acute myocardial infarction 06/04/2015  . Lumbar radiculopathy 06/04/2015  . Sciatica 02/22/2009  . Diabetes mellitus, type 2 ([place]) 01/07/2009  . Lumbosacral spondylosis 12/06/2008  . ED (erectile dysfunction) of organic origin 10/18/2008  . Coronary artery disease 10/16/2008  . HLD (hyperlipidemia) 07/23/2008  . Essential (primary) hypertension 07/23/2008    Past Surgical Hx Past Surgical History:  Procedure Laterality Date  . CARDIAC CATHETERIZATION    . chest surgery for fungal infection    . COLONOSCOPY WITH PROPOFOL N/A 07/10/2017   Procedure: COLONOSCOPY WITH PROPOFOL;  Surgeon: [name], MD;  Location: [facility] ENDOSCOPY;  Service: Endoscopy;  Laterality: N/A;  . CORONARY ARTERY BYPASS GRAFT N/A 08/12/2019   Procedure: CORONARY ARTERY BYPASS GRAFTING (CABG)X 4;  Surgeon: [name], MD;  Location: [facility];  Service: Open Heart Surgery;  Laterality: N/A;  CABG x  4  using bilateral internal mammary arteries and endoscopically harvested left saphenous vein  . ESOPHAGOGASTRODUODENOSCOPY (EGD) WITH PROPOFOL N/A 07/10/2017   Procedure: ESOPHAGOGASTRODUODENOSCOPY (EGD) WITH PROPOFOL;  Surgeon: [name], MD;  Location: [facility] ENDOSCOPY;  Service: Endoscopy;  Laterality: N/A;  . HERNIA REPAIR    . LEFT HEART CATH AND CORONARY ANGIOGRAPHY Left 07/21/2019   Procedure: LEFT HEART CATH AND CORONARY  ANGIOGRAPHY;  Surgeon: [name], MD;  Location: [facility];  Service: Cardiovascular;  Laterality: Left;  . TEE WITHOUT CARDIOVERSION N/A 08/12/2019   Procedure: TRANSESOPHAGEAL ECHOCARDIOGRAM (TEE);  Surgeon: [name], MD;  Location: [facility];  Service: Open Heart Surgery;  Laterality: N/A;    Medications Prior to Admission medications   Medication Sig Start Date End Date Taking? Authorizing Provider    acetaminophen (TYLENOL) 500 MG tablet Take 1,000 mg by mouth every 6 (six) hours as needed for moderate pain.    [provider]  amLODipine (NORVASC) 10 MG tablet Take 10 mg by mouth daily.    [provider]  aspirin EC 81 MG tablet Take 81 mg by mouth daily.    [provider]  clopidogrel (PLAVIX) 75 MG tablet Take 1 tablet (75 mg total) by mouth daily. 10/08/19   [name], MD  empagliflozin (JARDIANCE) 25 MG TABS tablet Take 25 mg by mouth daily before breakfast. 02/23/20   [name], MD  isosorbide dinitrate (ISORDIL) 10 MG tablet Take 1 tablet (10 mg total) by mouth 3 (three) times daily. Patient not taking: Reported on 04/01/2020 08/17/19   Elgie Collard, PA-C  metFORMIN (GLUCOPHAGE) 500 MG tablet TAKE TWO TABLETS BY MOUTH TWICE DAILY WITH A MEAL Patient taking differently: Take 1,000 mg by mouth daily.  12/01/19   [name], MD  metoprolol succinate (TOPROL-XL) 25 MG 24 hr tablet Take 25 mg by mouth daily. 12/25/19   [provider]  oxyCODONE-acetaminophen (PERCOCET) 10-325 MG tablet Take 1 tablet by mouth every 8 (eight) hours as needed for pain. Patient taking differently: Take 1 tablet by mouth every 8 (eight) hours as needed for pain (feet/leg pain.).  03/26/20   [name], MD  rosuvastatin (CRESTOR) 20 MG tablet Take 1 tablet (20 mg total) by mouth daily at 6 PM. Patient taking differently: Take 20 mg by mouth daily.  08/17/19   Elgie Collard, PA-C  sildenafil (VIAGRA) 100 MG tablet Take 0.5-1 tablets (50-100 mg total) by mouth daily as needed for erectile dysfunction. 08/26/19   [name], MD  SYSTANE COMPLETE 0.6 % SOLN Place 1 drop into both eyes 3 (three) times daily as needed (dry/irritated eyes.).    [provider]  tadalafil (CIALIS) 10 MG tablet Take 1 tablet (10 mg total) by mouth daily as needed for erectile dysfunction. 01/21/20   [name], MD    Allergies Simvastatin and  Penicillins  Family Hx Family History  Problem Relation Age of Onset  . Hypertension Mother     Social Hx Social History   Tobacco Use  . Smoking status: Former Research scientist (life sciences)  . Smokeless tobacco: Former Systems developer    Types: Chew    Quit date: 11/27/1968  . Tobacco comment: used 2 packs per week; quit over 40 years ago  Substance Use Topics  . Alcohol use: Not Currently    Alcohol/week: 0.0 standard drinks  . Drug use: No     Review of Systems  Constitutional: Negative for fever. Negative for chills.  Positive for anemia.  Positive for fatigue, exertional shortness of breath, lightheadedness with positional changes. Eyes: Negative for visual changes. ENT: Negative for sore throat. Cardiovascular: Negative for chest pain. Respiratory: Negative for shortness of breath. Gastrointestinal: Negative for nausea. Negative for vomiting.  Genitourinary: Negative for dysuria. Musculoskeletal: Negative for leg swelling. Skin: Negative for rash. Neurological: Negative for headaches.   Physical Exam  Vital  Signs: ED Triage Vitals  Enc Vitals Group     BP 04/12/20 0940 109/64     Pulse Rate 04/12/20 0940 82     Resp 04/12/20 0940 19     Temp 04/12/20 0940 98.4 F (36.9 C)     Temp Source 04/12/20 0940 Oral     SpO2 04/12/20 0940 100 %     Weight 04/12/20 0941 173 lb (78.5 kg)     Height 04/12/20 0941 6\' 1"  (1.854 m)     Head Circumference --      Peak Flow --      Pain Score 04/12/20 0946 0     Pain Loc --      Pain Edu? --      Excl. in [place]? --     Constitutional: Alert and oriented. Well appearing. NAD.  Head: Normocephalic. Atraumatic. Eyes: Conjunctivae clear. Sclera anicteric. Pupils equal and symmetric. Nose: No masses or lesions. No congestion or rhinorrhea. Mouth/Throat: Wearing mask.  Neck: No stridor. Trachea midline.  Cardiovascular: Normal rate, regular rhythm. Extremities well perfused. Respiratory: Normal respiratory effort.  Lungs CTAB. Gastrointestinal: Soft.  Non-distended. Non-tender.  Genitourinary: RN chaperone present. No stool in rectal vault, inadequate sample, therefore unfortunately unable to guaiac.  Musculoskeletal: No lower extremity edema. No deformities. Neurologic:  Normal speech and language. No gross focal or lateralizing neurologic deficits are appreciated.  Skin: Skin is slightly pale. No rash noted. Psychiatric: Mood and affect are appropriate for situation.    Procedures  Procedure(s) performed (including critical care):  .Critical Care Performed by: Lilia Pro., MD Authorized by: Lilia Pro., MD   Critical care provider statement:    Critical care time (minutes):  35   Critical care was time spent personally by me on the following activities:  Discussions with consultants, evaluation of patient's response to treatment, examination of patient, ordering and performing treatments and interventions, ordering and review of laboratory studies, ordering and review of radiographic studies, pulse oximetry, re-evaluation of patient's condition, obtaining history from patient or surrogate and review of old charts     Initial Impression / Assessment and Plan / MDM / ED Course  69 y.o. male who presents to the ED with significant anemia noted on outpatient labs.  Mildly symptomatic with generalized fatigue, exertional shortness of breath, and lightheadedness with standing.  Takes 81 mg ASA and 500 mg NSAID daily, otherwise no excessive NSAID or alcohol use.  Ddx: occult GI blood loss, severe iron deficiency anemia. Rectal exam unrevealing w/ no stool on DRE to test.   Repeat labs today confirm hemoglobin 3.9 from ~9s previously. He is HDS. Patient consented for transfusion.  Will give dose of IV PPI and plan to admit for further work-up of his symptomatic severe anemia. Patient agreeable.  _______________________________   As part of my medical decision making I have reviewed available labs, radiology tests, reviewed old  records/performed chart review.    Final Clinical Impression(s) / ED Diagnosis  Final diagnoses:  Low hemoglobin  Symptomatic anemia       Note:  This document was prepared using Dragon voice recognition software and may include unintentional dictation errors.   Lilia Pro., MD 04/12/20 [phone]

## 2020-04-12 NOTE — Telephone Encounter (Signed)
Patient's wife advised. Patient will be going to [facility]. [facility] ER advised.

## 2020-04-12 NOTE — ED Notes (Addendum)
Lunch tray given. 

## 2020-04-13 ENCOUNTER — Observation Stay: Payer: Medicare Other | Admitting: Anesthesiology

## 2020-04-13 ENCOUNTER — Encounter
Admission: EM | Disposition: A | Discharge status: Home / Self Care | Payer: Self-pay | Source: Home / Self Care | Attending: Student

## 2020-04-13 ENCOUNTER — Other Ambulatory Visit: Payer: Self-pay

## 2020-04-13 ENCOUNTER — Other Ambulatory Visit: Payer: Self-pay | Admitting: *Deleted

## 2020-04-13 DIAGNOSIS — E119 Type 2 diabetes mellitus without complications: Secondary | ICD-10-CM | POA: Diagnosis not present

## 2020-04-13 DIAGNOSIS — K2940 Chronic atrophic gastritis without bleeding: Secondary | ICD-10-CM

## 2020-04-13 DIAGNOSIS — K649 Unspecified hemorrhoids: Secondary | ICD-10-CM | POA: Diagnosis not present

## 2020-04-13 DIAGNOSIS — D509 Iron deficiency anemia, unspecified: Secondary | ICD-10-CM

## 2020-04-13 DIAGNOSIS — K2970 Gastritis, unspecified, without bleeding: Secondary | ICD-10-CM | POA: Diagnosis not present

## 2020-04-13 DIAGNOSIS — E440 Moderate protein-calorie malnutrition: Secondary | ICD-10-CM | POA: Insufficient documentation

## 2020-04-13 DIAGNOSIS — K2971 Gastritis, unspecified, with bleeding: Secondary | ICD-10-CM | POA: Diagnosis not present

## 2020-04-13 DIAGNOSIS — D649 Anemia, unspecified: Secondary | ICD-10-CM | POA: Diagnosis not present

## 2020-04-13 DIAGNOSIS — K5790 Diverticulosis of intestine, part unspecified, without perforation or abscess without bleeding: Secondary | ICD-10-CM | POA: Diagnosis not present

## 2020-04-13 DIAGNOSIS — I10 Essential (primary) hypertension: Secondary | ICD-10-CM | POA: Diagnosis not present

## 2020-04-13 HISTORY — PX: ESOPHAGOGASTRODUODENOSCOPY (EGD) WITH PROPOFOL: SHX5813

## 2020-04-13 HISTORY — PX: COLONOSCOPY WITH PROPOFOL: SHX5780

## 2020-04-13 LAB — CBC
HCT: 21.8 % — ABNORMAL LOW (ref 39.0–52.0)
HCT: 24.3 % — ABNORMAL LOW (ref 39.0–52.0)
Hemoglobin: 6.6 g/dL — ABNORMAL LOW (ref 13.0–17.0)
Hemoglobin: 7.4 g/dL — ABNORMAL LOW (ref 13.0–17.0)
MCH: 20.9 pg — ABNORMAL LOW (ref 26.0–34.0)
MCH: 21.4 pg — ABNORMAL LOW (ref 26.0–34.0)
MCHC: 30.3 g/dL (ref 30.0–36.0)
MCHC: 30.5 g/dL (ref 30.0–36.0)
MCV: 69 fL — ABNORMAL LOW (ref 80.0–100.0)
MCV: 70.4 fL — ABNORMAL LOW (ref 80.0–100.0)
Platelets: 277 10*3/uL (ref 150–400)
Platelets: 245 10*3/uL (ref 150–400)
RBC: 3.16 MIL/uL — ABNORMAL LOW (ref 4.22–5.81)
RBC: 3.45 MIL/uL — ABNORMAL LOW (ref 4.22–5.81)
RDW: 24.4 % — ABNORMAL HIGH (ref 11.5–15.5)
RDW: 24.4 % — ABNORMAL HIGH (ref 11.5–15.5)
WBC: 7.4 10*3/uL (ref 4.0–10.5)
WBC: 5.4 10*3/uL (ref 4.0–10.5)
nRBC: 0 % (ref 0.0–0.2)
nRBC: 0 % (ref 0.0–0.2)

## 2020-04-13 LAB — PREPARE RBC (CROSSMATCH)

## 2020-04-13 LAB — GLUCOSE, CAPILLARY
Glucose-Capillary: 96 mg/dL (ref 70–99)
Glucose-Capillary: 87 mg/dL (ref 70–99)
Glucose-Capillary: 77 mg/dL (ref 70–99)
Glucose-Capillary: 78 mg/dL (ref 70–99)
Glucose-Capillary: 84 mg/dL (ref 70–99)
Glucose-Capillary: 131 mg/dL — ABNORMAL HIGH (ref 70–99)

## 2020-04-13 LAB — BASIC METABOLIC PANEL
Anion gap: 9 (ref 5–15)
BUN: 11 mg/dL (ref 8–23)
CO2: 22 mmol/L (ref 22–32)
Calcium: 8.9 mg/dL (ref 8.9–10.3)
Chloride: 108 mmol/L (ref 98–111)
Creatinine, Ser: 0.65 mg/dL (ref 0.61–1.24)
GFR calc Af Amer: >60 mL/min (ref >60)
GFR calc non Af Amer: >60 mL/min (ref >60)
Glucose, Bld: 86 mg/dL (ref 70–99)
Potassium: 3.5 mmol/L (ref 3.5–5.1)
Sodium: 139 mmol/L (ref 135–145)

## 2020-04-13 SURGERY — ESOPHAGOGASTRODUODENOSCOPY (EGD) WITH PROPOFOL
Anesthesia: General

## 2020-04-13 MED ORDER — DOCUSATE SODIUM 100 MG PO CAPS
100.0000 mg | ORAL_CAPSULE | Freq: Two times a day (BID) | ORAL | Status: DC
  Administered 2020-04-14: 12:00:00 100 mg via ORAL
  Filled 2020-04-13: qty 1

## 2020-04-13 MED ORDER — PROPOFOL 10 MG/ML IV BOLUS
INTRAVENOUS | Status: DC | PRN
  Administered 2020-04-13: 50 mg via INTRAVENOUS

## 2020-04-13 MED ORDER — LIDOCAINE 2% (20 MG/ML) 5 ML SYRINGE
INTRAMUSCULAR | Status: DC | PRN
  Administered 2020-04-13: 50 mg via INTRAVENOUS

## 2020-04-13 MED ORDER — ASPIRIN EC 81 MG PO TBEC
81.0000 mg | DELAYED_RELEASE_TABLET | Freq: Every day | ORAL | Status: DC
  Administered 2020-04-14: 81 mg via ORAL
  Filled 2020-04-13 (×2): qty 1

## 2020-04-13 MED ORDER — SODIUM CHLORIDE 0.9 % IV SOLN
510.0000 mg | Freq: Once | INTRAVENOUS | Status: AC
  Administered 2020-04-13: 510 mg via INTRAVENOUS
  Filled 2020-04-13: qty 17

## 2020-04-13 MED ORDER — FERROUS SULFATE 325 (65 FE) MG PO TABS
325.0000 mg | ORAL_TABLET | Freq: Two times a day (BID) | ORAL | Status: DC
  Administered 2020-04-13 – 2020-04-14 (×2): 325 mg via ORAL
  Filled 2020-04-13 (×2): qty 1

## 2020-04-13 MED ORDER — PROPOFOL 500 MG/50ML IV EMUL
INTRAVENOUS | Status: DC | PRN
  Administered 2020-04-13: 75 ug/kg/min via INTRAVENOUS

## 2020-04-13 MED ORDER — VANCOMYCIN HCL IN DEXTROSE 1-5 GM/200ML-% IV SOLN
1000.0000 mg | INTRAVENOUS | Status: DC
  Filled 2020-04-13: qty 200

## 2020-04-13 MED ORDER — PANTOPRAZOLE SODIUM 40 MG PO TBEC
40.0000 mg | DELAYED_RELEASE_TABLET | Freq: Every day | ORAL | Status: DC
  Administered 2020-04-14: 40 mg via ORAL
  Filled 2020-04-13: qty 1

## 2020-04-13 MED ORDER — CLOPIDOGREL BISULFATE 75 MG PO TABS
75.0000 mg | ORAL_TABLET | Freq: Every day | ORAL | Status: DC
  Administered 2020-04-14: 12:00:00 75 mg via ORAL
  Filled 2020-04-13: qty 1

## 2020-04-13 MED ORDER — SODIUM CHLORIDE 0.9% IV SOLUTION: Freq: Once | INTRAVENOUS | Status: AC

## 2020-04-13 MED ORDER — PROPOFOL 10 MG/ML IV BOLUS
INTRAVENOUS | Status: AC
  Filled 2020-04-13: qty 20

## 2020-04-13 NOTE — Progress Notes (Signed)
The upper endoscopy did not show any sign of active bleeding.  The stomach was friable and would bleed with contact from irrigation and from the scope.  The colonoscopy did not show any old or new blood.   The patient will be set up for a capsule endoscopy to be done tomorrow.

## 2020-04-13 NOTE — Op Note (Signed)
[facility] Gastroenterology Patient Name: Craig York Procedure Date: 04/13/2020 1:36 PM MRN: [id number] Account #: [id number] Date of Birth: [birth year] Admit Type: Inpatient Age: 69 Room: [facility] ENDO ROOM 4 Gender: Male Note Status: Finalized Procedure:             Upper GI endoscopy Indications:           Iron deficiency anemia secondary to chronic blood                         loss, Iron deficiency anemia Providers:             [name] MD, MD Referring MD:          No Local Md, MD (Referring MD) Medicines:             Propofol per Anesthesia Complications:         No immediate complications. Procedure:             Pre-Anesthesia Assessment:                        - Prior to the procedure, a History and Physical was                         performed, and patient medications and allergies were                         reviewed. The patient's tolerance of previous                         anesthesia was also reviewed. The risks and benefits                         of the procedure and the sedation options and risks                         were discussed with the patient. All questions were                         answered, and informed consent was obtained. Prior                         Anticoagulants: The patient has taken Plavix                         (clopidogrel), last dose was 1 day prior to procedure.                         ASA Grade Assessment: II - A patient with mild                         systemic disease. After reviewing the risks and                         benefits, the patient was deemed in satisfactory                         condition to undergo the procedure.  After obtaining informed consent, the endoscope was                         passed under direct vision. Throughout the procedure,                         the patient's blood pressure, pulse, and oxygen                         saturations were monitored  continuously. The Endoscope                         was introduced through the mouth, and advanced to the                         second part of duodenum. The upper GI endoscopy was                         accomplished without difficulty. The patient tolerated                         the procedure well. Findings:      The examined esophagus was normal.      Diffuse moderate inflammation characterized by Atrophy was found in the       entire examined stomach. Biopsies were taken with a cold forceps for       histology.      The examined duodenum was normal.      The stomach was friable and bleed with just a water flush. Impression:            - Normal esophagus.                        - Atrophic gastritis. Biopsied.                        - Normal examined duodenum.                        - The stomach was friable and bleed with just a water                         flush. Recommendation:        - Discharge patient to home.                        - Resume previous diet.                        - Continue present medications.                        - Await pathology results. Procedure Code(s):     --- Professional ---                        [phone], Esophagogastroduodenoscopy, flexible,                         transoral; with biopsy, single or multiple Diagnosis Code(s):     --- Professional ---  D50.9, Iron deficiency anemia, unspecified                        K29.40, Chronic atrophic gastritis without bleeding CPT copyright 2019 American Medical Association. All rights reserved. The codes documented in this report are preliminary and upon coder review may  be revised to meet current compliance requirements. [name] MD, MD 04/13/2020 2:56:21 PM This report has been signed electronically. Number of Addenda: 0 Note Initiated On: 04/13/2020 1:36 PM Estimated Blood Loss:  Estimated blood loss: none.      [facility]

## 2020-04-13 NOTE — Progress Notes (Addendum)
Initial Nutrition Assessment  DOCUMENTATION CODES:   Non-severe (moderate) malnutrition in context of chronic illness  INTERVENTION:  Once diet is advanced provide Ensure Max Protein po BID, each supplement provides 150 kcal and 30 grams of protein.  Discussed increased needs for calories/protein to prevent further unintentional weight loss and to eventually promote weight gain. Encouraged adequate intake from meals, snacks, and beverages.  NUTRITION DIAGNOSIS:   Moderate Malnutrition related to chronic illness(suspected inadequate oral intake related to catabolic nature of CABG 07/7988 and COVID 10/2019) as evidenced by moderate fat depletion, moderate muscle depletion, 16.2% weight loss over 8 months.  GOAL:   Patient will meet greater than or equal to 90% of their needs  MONITOR:   PO intake, Supplement acceptance, Diet advancement, Labs, Weight trends, I & O's  REASON FOR ASSESSMENT:   Malnutrition Screening Tool    ASSESSMENT:   69 year old male with PMHx of HLD, DM, hx MI s/p CABG x4 08/12/2019, HTN, iron deficiency anemia, hx COVID 10/2019 who is now admitted with symptomatic anemia.   Met with patient and his family members at bedside. Patient reports his appetite is good at baseline and has been unchanged. He eats 3 meals per day. For breakfast he has cereal in milk with a banana. For lunch he has salad. For dinner he has a meat with vegetables or other sides. He reports he eats well at these meals and family confirms he is eating well. However, patient reports weight loss that started in September after his CABG. He reports he used to weigh around 200-210 lbs. According to chart patient was 200.4 lbs on 08/13/2019. RD obtained bed scale weight of 76.2 kg today (167.99 lbs). Patient has lost 32.41 lbs (16.2% body weight) over the past 8 months, which is significant for time frame. Discussed that both CABG and COVID would have been catabolic and increased patient's needs for  calories/protein, which led to his weight loss. Encouraged adequate intake of calories and protein at meals. Discussed adding in a high-calorie, high-protein oral nutrition supplement to help prevent further unintentional weight loss/loss of lean body mass.  Medications reviewed and include: Novolog 0-9 units TID, Protonix.  Labs reviewed.  NUTRITION - FOCUSED PHYSICAL EXAM:    Most Recent Value  Orbital Region  Moderate depletion  Upper Arm Region  Severe depletion  Thoracic and Lumbar Region  Moderate depletion  Buccal Region  Moderate depletion  Temple Region  Moderate depletion  Clavicle Bone Region  Moderate depletion  Clavicle and Acromion Bone Region  Moderate depletion  Scapular Bone Region  Mild depletion  Dorsal Hand  Mild depletion  Patellar Region  Moderate depletion  Anterior Thigh Region  Moderate depletion  Posterior Calf Region  Severe depletion  Edema (RD Assessment)  None  Hair  Reviewed  Eyes  Reviewed  Mouth  Reviewed  Skin  Reviewed  Nails  Reviewed     Diet Order:   Diet Order            Diet Carb Modified Fluid consistency: Thin; Room service appropriate? Yes  Diet effective now        Diet - low sodium heart healthy             EDUCATION NEEDS:   Education needs have been addressed  Skin:  Skin Assessment: Reviewed RN Assessment  Last BM:  04/12/2020  Height:   Ht Readings from Last 1 Encounters:  04/13/20 6' 1"  (1.854 m)   Weight:   Wt Readings  from Last 1 Encounters:  04/13/20 76.2 kg   BMI:  Body mass index is 22.16 kg/m.  Estimated Nutritional Needs:   Kcal:  2200-2400  Protein:  110-120 grams  Fluid:  >/= 2 L/day  Jacklynn Barnacle, MS, RD, LDN Pager number available on Amion

## 2020-04-13 NOTE — Op Note (Signed)
[facility] Gastroenterology Patient Name: Craig York Procedure Date: 04/13/2020 1:44 PM MRN: [id number] Account #: [id number] Date of Birth: Mar 03, 1951 Admit Type: Inpatient Age: 69 Room: [facility] ENDO ROOM 4 Gender: Male Note Status: Finalized Procedure:             Colonoscopy Indications:           Iron deficiency anemia Providers:             [name] MD, MD Referring MD:          No Local Md, MD (Referring MD) Medicines:             Propofol per Anesthesia Complications:         No immediate complications. Procedure:             Pre-Anesthesia Assessment:                        - Prior to the procedure, a History and Physical was                         performed, and patient medications and allergies were                         reviewed. The patient's tolerance of previous                         anesthesia was also reviewed. The risks and benefits                         of the procedure and the sedation options and risks                         were discussed with the patient. All questions were                         answered, and informed consent was obtained. Prior                         Anticoagulants: The patient has taken Plavix                         (clopidogrel), last dose was 1 day prior to procedure.                         ASA Grade Assessment: II - A patient with mild                         systemic disease. After reviewing the risks and                         benefits, the patient was deemed in satisfactory                         condition to undergo the procedure.                        After obtaining informed consent, the colonoscope was  passed under direct vision. Throughout the procedure,                         the patient's blood pressure, pulse, and oxygen                         saturations were monitored continuously. The                         Colonoscope was introduced through the anus and                       advanced to the the cecum, identified by appendiceal                         orifice and ileocecal valve. The colonoscopy was                         performed without difficulty. The patient tolerated                         the procedure well. The quality of the bowel                         preparation was excellent. Findings:      The perianal and digital rectal examinations were normal.      Non-bleeding internal hemorrhoids were found during retroflexion. The       hemorrhoids were Grade II (internal hemorrhoids that prolapse but reduce       spontaneously). Impression:            - Non-bleeding internal hemorrhoids.                        - No specimens collected. Recommendation:        - Discharge patient to home.                        - Clear liquid diet.                        - Continue present medications.                        - To visualize the small bowel, perform video capsule                         endoscopy tomorrow. Procedure Code(s):     --- Professional ---                        [phone], Colonoscopy, flexible; diagnostic, including                         collection of specimen(s) by brushing or washing, when                         performed (separate procedure) Diagnosis Code(s):     --- Professional ---                        D50.9, Iron deficiency anemia, unspecified CPT  copyright 2019 American Medical Association. All rights reserved. The codes documented in this report are preliminary and upon coder review may  be revised to meet current compliance requirements. [name] MD, MD 04/13/2020 3:19:11 PM This report has been signed electronically. Number of Addenda: 0 Note Initiated On: 04/13/2020 1:44 PM Scope Withdrawal Time: 0 hours 7 minutes 49 seconds  Total Procedure Duration: 0 hours 11 minutes 15 seconds  Estimated Blood Loss:  Estimated blood loss: none.      [facility]

## 2020-04-13 NOTE — Anesthesia Preprocedure Evaluation (Signed)
Anesthesia Evaluation  Patient identified by MRN, date of birth, ID band Patient awake    Reviewed: Allergy & Precautions, H&P , NPO status , Patient's Chart, lab work & pertinent test results, reviewed documented beta blocker date and time   Airway Mallampati: II   Neck ROM: full    Dental  (+) Poor Dentition   Pulmonary neg pulmonary ROS, former smoker,    Pulmonary exam normal        Cardiovascular Exercise Tolerance: Poor hypertension, On Medications + angina with exertion + CAD and + Past MI  Normal cardiovascular exam Rhythm:regular Rate:Normal     Neuro/Psych  Neuromuscular disease negative psych ROS   GI/Hepatic negative GI ROS, Neg liver ROS,   Endo/Other  negative endocrine ROSdiabetes  Renal/GU negative Renal ROS  negative genitourinary   Musculoskeletal   Abdominal   Peds  Hematology  (+) Blood dyscrasia, anemia ,   Anesthesia Other Findings Past Medical History: No date: Anginal pain (HCC) No date: Benign neoplasm of descending colon No date: BPH (benign prostatic hyperplasia) No date: Coronary artery disease No date: Diabetes mellitus without complication ([place]) [id number]: Hemorrhage of gastrointestinal tract No date: Hyperlipidemia No date: Hypertension No date: Iron deficiency anemia No date: Myocardial infarction ([place]) No date: Polyp of sigmoid colon 04/12/2020: Symptomatic anemia Past Surgical History: No date: CARDIAC CATHETERIZATION No date: chest surgery for fungal infection 07/10/2017: COLONOSCOPY WITH PROPOFOL; N/A     Comment:  Procedure: COLONOSCOPY WITH PROPOFOL;  Surgeon: [name], MD;  Location: [facility] ENDOSCOPY;  Service:               Endoscopy;  Laterality: N/A; 08/12/2019: CORONARY ARTERY BYPASS GRAFT; N/A     Comment:  Procedure: CORONARY ARTERY BYPASS GRAFTING (CABG)X 4;                Surgeon: [name], MD;  Location: [place];                Service:  Open Heart Surgery;  Laterality: N/A;  CABG x  4              using bilateral internal mammary arteries and               endoscopically harvested left saphenous vein 07/10/2017: ESOPHAGOGASTRODUODENOSCOPY (EGD) WITH PROPOFOL; N/A     Comment:  Procedure: ESOPHAGOGASTRODUODENOSCOPY (EGD) WITH               PROPOFOL;  Surgeon: [name], MD;  Location: [facility]               ENDOSCOPY;  Service: Endoscopy;  Laterality: N/A; No date: HERNIA REPAIR 07/21/2019: LEFT HEART CATH AND CORONARY ANGIOGRAPHY; Left     Comment:  Procedure: LEFT HEART CATH AND CORONARY ANGIOGRAPHY;                Surgeon: [name], MD;  Location: [place]              CV LAB;  Service: Cardiovascular;  Laterality: Left; 08/12/2019: TEE WITHOUT CARDIOVERSION; N/A     Comment:  Procedure: TRANSESOPHAGEAL ECHOCARDIOGRAM (TEE);                Surgeon: [name], MD;  Location: [place];                Service: Open Heart Surgery;  Laterality: N/A; BMI    Body  Mass Index: 22.82 kg/m     Reproductive/Obstetrics negative OB ROS                             Anesthesia Physical Anesthesia Plan  ASA: III  Anesthesia Plan: General   Post-op Pain Management:    Induction:   PONV Risk Score and Plan:   Airway Management Planned:   Additional Equipment:   Intra-op Plan:   Post-operative Plan:   Informed Consent: I have reviewed the patients History and Physical, chart, labs and discussed the procedure including the risks, benefits and alternatives for the proposed anesthesia with the patient or authorized representative who has indicated his/her understanding and acceptance.     Dental Advisory Given  Plan Discussed with: CRNA  Anesthesia Plan Comments:         Anesthesia Quick Evaluation

## 2020-04-13 NOTE — Progress Notes (Signed)
PROGRESS NOTE    Craig York  [id number] DOB: 09-04-1951 DOA: 04/12/2020 PCP: [name], MD      Brief Narrative:  Craig York is a 69 y.o. M with CAD s/p CABG 2020, DM and IDA who presented with abnormal labs.  Patient had had generalized weakness, exertional dyspnea, and fatigue for some time.  He had had some bright red blood from rectum for last few days, but no melena, no dizziness, no syncope.  Went to PCP who checked blood work that showed Hgb 3.9 g/dL.    In the ER, Hgb still 3.9 g/dL.  Patient with normal vital signs and mentation.  Transfusion started and admitted to hospitalist service.        Assessment & Plan:   Chronic GI blood loss anemia Atrophic Gastritis Patient was admitted, underwent blood transfusion x4 units PRBCs.     Given Feraheme x1.    Underwent EGD and colonoscopy that showed only friable gastric mucosa, no suspicious lesions in the colon.  Biopsies from stomach sent.  -Start oral iron -Continue PPI -Check post-transfusion H/H  -Check B12 -GI have asked that patient be held until tomorrow morning, can be discharged after swallows capsule, to return the capsule tomorrow night    Diabetes -Hold SGLT-2 inhibitor, metformin -Continue SS corrections   Hypertension -Continue amlodipine, metoprolol, Crestor.  Coronary disease secondary prevention, recent CABG Sept 2020 -Resume aspirin and Plavix.   -Defer Plavix continuation long term to Dr. Kipp Brood  Moderate protein calorie malnutrition     Disposition: Status is: Observation  The patient remains OBS appropriate and will d/c before 2 midnights.  Dispo: The patient is from: Home              Anticipated d/c is to: Home              Anticipated d/c date is: 1 day              Patient currently is not medically stable to d/c.              MDM: The below labs and imaging reports were reviewed and summarized above.  Medication management as above.     DVT prophylaxis: SCDs Code Status: FULL Family Communication: Daughter and wife at bedside    Consultants:   GI  Procedures:   5/18 EGD  5/18 Colonoscopy  Antimicrobials:      Culture data:              Subjective: Patient feeling well.  No melena, hematochezia, vomiting, diarrhea.  No confusion.  No hematemesis.  Objective: Vitals:   04/13/20 1300 04/13/20 1332 04/13/20 1352 04/13/20 1513  BP: 133/81 138/74  108/63  Pulse: 70 72    Resp: 18 20    Temp: 98.6 F (37 C) (!) 97.1 F (36.2 C)  98.1 F (36.7 C)  TempSrc: Oral Temporal  Temporal  SpO2:  100%    Weight:  78.5 kg 76.2 kg   Height:  6\' 1"  (1.854 m)      Intake/Output Summary (Last 24 hours) at 04/13/2020 1534 Last data filed at 04/13/2020 0700 Gross per 24 hour  Intake 815.67 ml  Output 400 ml  Net 415.67 ml   Filed Weights   04/12/20 0941 04/13/20 1332 04/13/20 1352  Weight: 78.5 kg 78.5 kg 76.2 kg    Examination: General appearance:  adult male, alert and in no acute distress.   HEENT: Anicteric, conjunctiva pink, lids and lashes normal. No  nasal deformity, discharge, epistaxis.  Lips moist.   Skin: Warm and dry. Pale.  no jaundice.  No suspicious rashes or lesions. Cardiac: RRR, nl S1-S2, no murmurs appreciated.  Capillary refill is brisk.  JVP normal.  No LE edema.  Radial  pulses 2+ and symmetric. Respiratory: Normal respiratory rate and rhythm.  CTAB without rales or wheezes. Abdomen: Abdomen soft.  no TTP. No ascites, distension, hepatosplenomegaly.   MSK: No deformities or effusions.Diffuse loss of subcutaneous muslce mass and fat.  Neuro: Awake and alert.  EOMI, moves all extremities. Speech fluent.    Psych: Sensorium intact and responding to questions, attention normal. Affect normal.  Judgment and insight appear normal.    Data Reviewed: I have personally reviewed following labs and imaging studies:  CBC: Recent Labs  Lab 04/09/20 1212 04/12/20 0953 04/12/20 2154  04/13/20 0509 04/13/20 1149  WBC 5.7 6.3  --  7.4 5.4  NEUTROABS  --  4.8  --   --   --   HGB 3.9* 3.9* 6.2* 6.6* 7.4*  HCT 15.3* 15.0* 21.0* 21.8* 24.3*  MCV 64* 62.0*  --  69.0* 70.4*  PLT 296 336  --  277 [id number]   Basic Metabolic Panel: Recent Labs  Lab 04/09/20 1212 04/12/20 0953 04/13/20 0509  NA 141 140 139  K 4.3 3.9 3.5  CL 107* 109 108  CO2 18* 21* 22  GLUCOSE 97 188* 86  BUN 14 12 11   CREATININE 0.93 0.85 0.65  CALCIUM 9.2 9.2 8.9   GFR: Estimated Creatinine Clearance: 95.3 mL/min (by C-G formula based on SCr of 0.65 mg/dL). Liver Function Tests: Recent Labs  Lab 04/09/20 1212 04/12/20 0953  AST 8 16  ALT 6 9  ALKPHOS 82 65  BILITOT 0.5 0.8  PROT 7.2 7.8  ALBUMIN 4.3 4.0   No results for input(s): LIPASE, AMYLASE in the last 168 hours. No results for input(s): AMMONIA in the last 168 hours. Coagulation Profile: No results for input(s): INR, PROTIME in the last 168 hours. Cardiac Enzymes: No results for input(s): CKTOTAL, CKMB, CKMBINDEX, TROPONINI in the last 168 hours. BNP (last 3 results) No results for input(s): PROBNP in the last 8760 hours. HbA1C: No results for input(s): HGBA1C in the last 72 hours. CBG: Recent Labs  Lab 04/12/20 2134 04/13/20 0808 04/13/20 1156 04/13/20 1344 04/13/20 1518  GLUCAP 100* 96 87 77 78   Lipid Profile: No results for input(s): CHOL, HDL, LDLCALC, TRIG, CHOLHDL, LDLDIRECT in the last 72 hours. Thyroid Function Tests: No results for input(s): TSH, T4TOTAL, FREET4, T3FREE, THYROIDAB in the last 72 hours. Anemia Panel: No results for input(s): VITAMINB12, FOLATE, FERRITIN, TIBC, IRON, RETICCTPCT in the last 72 hours. Urine analysis:    Component Value Date/Time   COLORURINE YELLOW 08/08/2019 1622   APPEARANCEUR CLEAR 08/08/2019 1622   LABSPEC 1.029 08/08/2019 1622   PHURINE 5.0 08/08/2019 1622   GLUCOSEU >=500 (A) 08/08/2019 1622   HGBUR NEGATIVE 08/08/2019 1622   BILIRUBINUR NEGATIVE 08/08/2019 1622    KETONESUR NEGATIVE 08/08/2019 1622   PROTEINUR NEGATIVE 08/08/2019 1622   NITRITE NEGATIVE 08/08/2019 1622   LEUKOCYTESUR NEGATIVE 08/08/2019 1622   Sepsis Labs: @LABRCNTIP (procalcitonin:4,lacticacidven:4)  ) Recent Results (from the past 240 hour(s))  SARS Coronavirus 2 by RT PCR (hospital order, performed in [place] hospital lab) Nasopharyngeal Nasopharyngeal Swab     Status: None   Collection Time: 04/12/20 11:32 AM   Specimen: Nasopharyngeal Swab  Result Value Ref Range Status   SARS Coronavirus 2 NEGATIVE  NEGATIVE Final    Comment: (NOTE) SARS-CoV-2 target nucleic acids are NOT DETECTED. The SARS-CoV-2 RNA is generally detectable in upper and lower respiratory specimens during the acute phase of infection. The lowest concentration of SARS-CoV-2 viral copies this assay can detect is 250 copies / mL. A negative result does not preclude SARS-CoV-2 infection and should not be used as the sole basis for treatment or other patient management decisions.  A negative result may occur with improper specimen collection / handling, submission of specimen other than nasopharyngeal swab, presence of viral mutation(s) within the areas targeted by this assay, and inadequate number of viral copies (<250 copies / mL). A negative result must be combined with clinical observations, patient history, and epidemiological information. Fact Sheet for Patients:   StrictlyIdeas.no Fact Sheet for Healthcare Providers: BankingDealers.co.za This test is not yet approved or cleared  by the [place] FDA and has been authorized for detection and/or diagnosis of SARS-CoV-2 by FDA under an Emergency Use Authorization (EUA).  This EUA will remain in effect (meaning this test can be used) for the duration of the COVID-19 declaration under Section 564(b)(1) of the Act, 21 U.S.C. section 360bbb-3(b)(1), unless the authorization is terminated or revoked  sooner. Performed at [facility], [address] [place], [place] [address]          Radiology Studies: No results found.      Scheduled Meds: . [MAR Hold] sodium chloride   Intravenous Once  . [MAR Hold] amLODipine  10 mg Oral Daily  . [MAR Hold] insulin aspart  0-9 Units Subcutaneous TID WC  . [MAR Hold] metoprolol succinate  25 mg Oral Daily  . [MAR Hold] pantoprazole (PROTONIX) IV  40 mg Intravenous Q24H  . [MAR Hold] rosuvastatin  20 mg Oral q1800  . [MAR Hold] sodium chloride flush  3 mL Intravenous Q12H   Continuous Infusions: . [MAR Hold] sodium chloride 200 mL/hr (04/13/20 1444)  . [MAR Hold] sodium chloride    . vancomycin       LOS: 0 days    Time spent: 25 minutes    Edwin Dada, MD [facility] 04/13/2020, 3:34 PM     Please page though [place] or Epic secure chat:  For [employer], Adult nurse

## 2020-04-13 NOTE — Transfer of Care (Signed)
Immediate Anesthesia Transfer of Care Note  Patient: Craig York  Procedure(s) Performed: ESOPHAGOGASTRODUODENOSCOPY (EGD) WITH PROPOFOL (N/A ) COLONOSCOPY WITH PROPOFOL (N/A )  Patient Location: PACU and Endoscopy Unit  Anesthesia Type:General  Level of Consciousness: drowsy  Airway & Oxygen Therapy: Patient Spontanous Breathing  Post-op Assessment: Report given to RN and Post -op Vital signs reviewed and stable  Post vital signs: Reviewed and stable  Last Vitals:  Vitals Value Taken Time  BP 108/63 04/13/20 1514  Temp    Pulse 74 04/13/20 1514  Resp 13 04/13/20 1514  SpO2 100 % 04/13/20 1514  Vitals shown include unvalidated device data.  Last Pain:  Vitals:   04/13/20 1332  TempSrc: Temporal  PainSc: 0-No pain         Complications: No apparent anesthesia complications

## 2020-04-13 NOTE — Anesthesia Postprocedure Evaluation (Signed)
Anesthesia Post Note  Patient: Craig York  Procedure(s) Performed: ESOPHAGOGASTRODUODENOSCOPY (EGD) WITH PROPOFOL (N/A ) COLONOSCOPY WITH PROPOFOL (N/A )  Patient location during evaluation: Endoscopy Anesthesia Type: General Level of consciousness: awake and alert Pain management: pain level controlled Vital Signs Assessment: post-procedure vital signs reviewed and stable Respiratory status: spontaneous breathing and respiratory function stable Cardiovascular status: stable Anesthetic complications: no     Last Vitals:  Vitals:   04/13/20 1332 04/13/20 1513  BP: 138/74 108/63  Pulse: 72   Resp: 20   Temp: (!) 36.2 C 36.7 C  SpO2: 100%     Last Pain:  Vitals:   04/13/20 1513  TempSrc: Temporal  PainSc: Asleep                 Lovelace Cerveny K

## 2020-04-14 ENCOUNTER — Other Ambulatory Visit: Payer: Self-pay | Admitting: *Deleted

## 2020-04-14 ENCOUNTER — Encounter
Admission: EM | Disposition: A | Discharge status: Home / Self Care | Payer: Self-pay | Source: Home / Self Care | Attending: Student

## 2020-04-14 ENCOUNTER — Encounter: Payer: Self-pay | Admitting: *Deleted

## 2020-04-14 DIAGNOSIS — K529 Noninfective gastroenteritis and colitis, unspecified: Secondary | ICD-10-CM

## 2020-04-14 DIAGNOSIS — D509 Iron deficiency anemia, unspecified: Secondary | ICD-10-CM | POA: Diagnosis not present

## 2020-04-14 DIAGNOSIS — J986 Disorders of diaphragm: Secondary | ICD-10-CM

## 2020-04-14 DIAGNOSIS — D649 Anemia, unspecified: Secondary | ICD-10-CM | POA: Diagnosis not present

## 2020-04-14 DIAGNOSIS — I10 Essential (primary) hypertension: Secondary | ICD-10-CM | POA: Diagnosis not present

## 2020-04-14 HISTORY — PX: GIVENS CAPSULE STUDY: SHX5432

## 2020-04-14 LAB — COMPREHENSIVE METABOLIC PANEL
ALT: 10 U/L (ref 0–44)
AST: 14 U/L — ABNORMAL LOW (ref 15–41)
Albumin: 3.5 g/dL (ref 3.5–5.0)
Alkaline Phosphatase: 67 U/L (ref 38–126)
Anion gap: 6 (ref 5–15)
BUN: 8 mg/dL (ref 8–23)
CO2: 25 mmol/L (ref 22–32)
Calcium: 8.9 mg/dL (ref 8.9–10.3)
Chloride: 107 mmol/L (ref 98–111)
Creatinine, Ser: 0.65 mg/dL (ref 0.61–1.24)
GFR calc Af Amer: >60 mL/min (ref >60)
GFR calc non Af Amer: >60 mL/min (ref >60)
Glucose, Bld: 83 mg/dL (ref 70–99)
Potassium: 3.5 mmol/L (ref 3.5–5.1)
Sodium: 138 mmol/L (ref 135–145)
Total Bilirubin: 2.2 mg/dL — ABNORMAL HIGH (ref 0.3–1.2)
Total Protein: 6.8 g/dL (ref 6.5–8.1)

## 2020-04-14 LAB — TYPE AND SCREEN
ABO/RH(D): A POS
Antibody Screen: NEGATIVE
Unit division: 0
Unit division: 0
Unit division: 0
Unit division: 0

## 2020-04-14 LAB — CBC
HCT: 25.7 % — ABNORMAL LOW (ref 39.0–52.0)
Hemoglobin: 8 g/dL — ABNORMAL LOW (ref 13.0–17.0)
MCH: 21.9 pg — ABNORMAL LOW (ref 26.0–34.0)
MCHC: 31.1 g/dL (ref 30.0–36.0)
MCV: 70.2 fL — ABNORMAL LOW (ref 80.0–100.0)
Platelets: 249 10*3/uL (ref 150–400)
RBC: 3.66 MIL/uL — ABNORMAL LOW (ref 4.22–5.81)
RDW: 24 % — ABNORMAL HIGH (ref 11.5–15.5)
WBC: 6.5 10*3/uL (ref 4.0–10.5)
nRBC: 0 % (ref 0.0–0.2)

## 2020-04-14 LAB — BPAM RBC
Blood Product Expiration Date: 202105292359
Blood Product Expiration Date: 202106012359
Blood Product Expiration Date: 202106032359
Blood Product Expiration Date: 202106062359
ISSUE DATE / TIME: 202105171252
ISSUE DATE / TIME: 202105171659
ISSUE DATE / TIME: 202105180015
ISSUE DATE / TIME: 202105181023
Unit Type and Rh: 6200
Unit Type and Rh: 6200
Unit Type and Rh: 6200
Unit Type and Rh: 6200

## 2020-04-14 LAB — GLUCOSE, CAPILLARY
Glucose-Capillary: 98 mg/dL (ref 70–99)
Glucose-Capillary: 155 mg/dL — ABNORMAL HIGH (ref 70–99)

## 2020-04-14 LAB — VITAMIN B12: Vitamin B-12: 437 pg/mL (ref 180–914)

## 2020-04-14 SURGERY — IMAGING PROCEDURE, GI TRACT, INTRALUMINAL, VIA CAPSULE

## 2020-04-14 MED ORDER — PANTOPRAZOLE SODIUM 40 MG PO TBEC: 40.0000 mg | DELAYED_RELEASE_TABLET | Freq: Every day | ORAL | 0 refills | Status: DC

## 2020-04-14 MED ORDER — FERROUS SULFATE 325 (65 FE) MG PO TABS: 325.0000 mg | ORAL_TABLET | Freq: Two times a day (BID) | ORAL | 0 refills | Status: AC

## 2020-04-14 NOTE — Discharge Summary (Addendum)
Physician Discharge Summary  Patient ID: LAKE SCHNITKER MRN: [id number] DOB/AGE: May 21, 1951 69 y.o.  Admit date: 04/12/2020 Discharge date: 04/14/2020  Admission Diagnoses:  Discharge Diagnoses:  Principal Problem:   Symptomatic anemia Active Problems:   Coronary artery disease   Diabetes mellitus, type 2 (HCC)   Essential (primary) hypertension   Malnutrition of moderate degree Iron deficient anemia.  Discharged Condition: good  Hospital Course:   Mr. Valtierra is a 69 y.o. M with CAD s/p CABG 2020, DM and IDA who presented with abnormal labs.  Patient had had generalized weakness, exertional dyspnea, and fatigue for some time.  He had had some bright red blood from rectum for last few days, but no melena, no dizziness, no syncope.  Went to PCP who checked blood work that showed Hgb 3.9 g/dL.    Patient received a total of 4 units of PRBC transfusion.  Hemoglobin has been stable now at 8.  No additional bleeding.  Patient was also giving IV iron.  He has iron deficiency, normal B12 level.  We will continue oral iron supplement.  Patient also has been complaining of chronic diarrhea for years, he also lost 20 to 30 pounds body weight recently.  He appears to have malabsorption. He will follow up with GI for the biopsy results from his EGD to consider additional treatment.  He will be also continued on Protonix for now.  Patient also had capsule endoscopy performed today, he will follow-up with GI for the results.   Consults: Gastroenterology  Significant Diagnostic Studies:  Colonoscopy showed internal hemorrhoids without active bleeding EGD showed gastric inflammation and atrophy, no active bleeding.  Biopsy taken.  Treatments: Blood transfusion  Discharge Exam: Blood pressure 138/80, pulse 70, temperature 99 F (37.2 C), temperature source Oral, resp. rate 20, height 6\' 1"  (1.854 m), weight 76.2 kg, SpO2 100 %. General appearance: alert and cooperative Resp: clear to  auscultation bilaterally Cardio: regular rate and rhythm, S1, S2 normal, no murmur, click, rub or gallop GI: soft, non-tender; bowel sounds normal; no masses,  no organomegaly Extremities: extremities normal, atraumatic, no cyanosis or edema  Disposition: Discharge disposition: 01-Home or Self Care       Discharge Instructions    Diet - low sodium heart healthy   Complete by: As directed    Increase activity slowly   Complete by: As directed      Allergies as of 04/14/2020      Reactions   Simvastatin    Elevated CK   Penicillins Rash   Did it involve swelling of the face/tongue/throat, SOB, or low BP? No Did it involve sudden or severe rash/hives, skin peeling, or any reaction on the inside of your mouth or nose? No Did you need to seek medical attention at a hospital or doctor's office? No When did it last happen? If all above answers are "NO", may proceed with cephalosporin use.      Medication List    TAKE these medications   acetaminophen 500 MG tablet Commonly known as: TYLENOL Take 1,000 mg by mouth every 6 (six) hours as needed for moderate pain.   amLODipine 10 MG tablet Commonly known as: NORVASC Take 10 mg by mouth daily.   aspirin EC 81 MG tablet Take 81 mg by mouth daily.   clopidogrel 75 MG tablet Commonly known as: PLAVIX Take 1 tablet (75 mg total) by mouth daily.   ferrous sulfate 325 (65 FE) MG tablet Take 1 tablet (325 mg total) by mouth 2 (  two) times daily with a meal.   Jardiance 25 MG Tabs tablet Generic drug: empagliflozin Take 25 mg by mouth daily before breakfast.   metFORMIN 500 MG tablet Commonly known as: GLUCOPHAGE TAKE TWO TABLETS BY MOUTH TWICE DAILY WITH A MEAL What changed:   how much to take  how to take this  when to take this  additional instructions   metoprolol succinate 25 MG 24 hr tablet Commonly known as: TOPROL-XL Take 25 mg by mouth daily.   oxyCODONE-acetaminophen 10-325 MG tablet Commonly known  as: PERCOCET Take 1 tablet by mouth every 8 (eight) hours as needed for pain. What changed: reasons to take this   pantoprazole 40 MG tablet Commonly known as: PROTONIX Take 1 tablet (40 mg total) by mouth daily. Start taking on: Apr 15, 2020   rosuvastatin 20 MG tablet Commonly known as: CRESTOR Take 1 tablet (20 mg total) by mouth daily at 6 PM. What changed: when to take this   sildenafil 100 MG tablet Commonly known as: Viagra Take 0.5-1 tablets (50-100 mg total) by mouth daily as needed for erectile dysfunction.   Systane Complete 0.6 % Soln Generic drug: Propylene Glycol Place 1 drop into both eyes 3 (three) times daily as needed (dry/irritated eyes.).   tadalafil 10 MG tablet Commonly known as: Cialis Take 1 tablet (10 mg total) by mouth daily as needed for erectile dysfunction.      Follow-up Information    [name], MD Follow up in 1 week(s).   Specialty: Family Medicine Contact information: [address] [place] [address] [phone]        [name], MD Follow up in 1 week(s).   Specialty: Gastroenterology Contact information: [place]  [place] [address] [phone]           Signed: Sharen Hones 04/14/2020, 1:20 PM

## 2020-04-14 NOTE — Discharge Planning (Addendum)
Patient IV an tele removed. RN assessment and VS revealed stability for DC to home.  Discharge papers given, explained and educated.  Informed of suggested FU appts and appts made.  Scripts sent to pharm, per patient request.  Just waiting on endo capsule study to conclude at 1500.  Once belt is removed, should be ready to go home.  Then, will be wheeled to front and family to transport home via car.

## 2020-04-15 ENCOUNTER — Inpatient Hospital Stay (HOSPITAL_COMMUNITY): Admission: RE | Admit: 2020-04-15 | Payer: Medicare Other | Source: Ambulatory Visit

## 2020-04-15 ENCOUNTER — Other Ambulatory Visit (HOSPITAL_COMMUNITY): Payer: Medicare Other

## 2020-04-15 ENCOUNTER — Encounter: Payer: Self-pay | Admitting: *Deleted

## 2020-04-15 ENCOUNTER — Encounter (HOSPITAL_COMMUNITY): Payer: Medicare Other

## 2020-04-15 ENCOUNTER — Other Ambulatory Visit: Payer: Medicare Other

## 2020-04-15 LAB — PREPARE RBC (CROSSMATCH)

## 2020-04-16 ENCOUNTER — Other Ambulatory Visit: Payer: Medicare Other

## 2020-04-16 LAB — SURGICAL PATHOLOGY

## 2020-04-18 ENCOUNTER — Encounter: Payer: Self-pay | Admitting: Gastroenterology

## 2020-04-19 ENCOUNTER — Encounter (HOSPITAL_COMMUNITY): Admission: RE | Payer: Self-pay | Source: Home / Self Care

## 2020-04-19 ENCOUNTER — Inpatient Hospital Stay (HOSPITAL_COMMUNITY)
Admission: RE | Admit: 2020-04-19 | Payer: Medicare Other | Source: Home / Self Care | Admitting: Thoracic Surgery (Cardiothoracic Vascular Surgery)

## 2020-04-19 SURGERY — LOBECTOMY, LUNG, ROBOT-ASSISTED, USING VATS
Anesthesia: General | Site: Chest | Laterality: Right

## 2020-04-21 ENCOUNTER — Other Ambulatory Visit: Payer: Self-pay

## 2020-04-21 ENCOUNTER — Other Ambulatory Visit
Admission: RE | Admit: 2020-04-21 | Discharge: 2020-04-21 | Disposition: A | Discharge status: Home / Self Care | Payer: Medicare Other | Source: Ambulatory Visit | Attending: Thoracic Surgery (Cardiothoracic Vascular Surgery) | Admitting: Thoracic Surgery (Cardiothoracic Vascular Surgery)

## 2020-04-21 DIAGNOSIS — Z01812 Encounter for preprocedural laboratory examination: Secondary | ICD-10-CM | POA: Diagnosis not present

## 2020-04-21 DIAGNOSIS — Z20822 Contact with and (suspected) exposure to covid-19: Secondary | ICD-10-CM | POA: Insufficient documentation

## 2020-04-21 DIAGNOSIS — I10 Essential (primary) hypertension: Secondary | ICD-10-CM | POA: Diagnosis not present

## 2020-04-21 DIAGNOSIS — I2102 ST elevation (STEMI) myocardial infarction involving left anterior descending coronary artery: Secondary | ICD-10-CM | POA: Diagnosis not present

## 2020-04-21 DIAGNOSIS — Z951 Presence of aortocoronary bypass graft: Secondary | ICD-10-CM | POA: Diagnosis not present

## 2020-04-21 DIAGNOSIS — I25118 Atherosclerotic heart disease of native coronary artery with other forms of angina pectoris: Secondary | ICD-10-CM | POA: Diagnosis not present

## 2020-04-21 DIAGNOSIS — R0600 Dyspnea, unspecified: Secondary | ICD-10-CM | POA: Diagnosis not present

## 2020-04-21 LAB — SARS CORONAVIRUS 2 (TAT 6-24 HRS): SARS Coronavirus 2: NEGATIVE

## 2020-04-22 ENCOUNTER — Other Ambulatory Visit: Payer: Self-pay | Admitting: Family Medicine

## 2020-04-22 DIAGNOSIS — R809 Proteinuria, unspecified: Secondary | ICD-10-CM

## 2020-04-22 DIAGNOSIS — M5416 Radiculopathy, lumbar region: Secondary | ICD-10-CM

## 2020-04-22 MED ORDER — OXYCODONE-ACETAMINOPHEN 10-325 MG PO TABS: 1.0000 | ORAL_TABLET | Freq: Three times a day (TID) | ORAL | 0 refills | Status: DC | PRN

## 2020-04-22 NOTE — Telephone Encounter (Signed)
Phone call to pt. To discuss request for Digoxin, as this is not on his active medication sheet.  When pt. Was asked to verify his dob, he asked if he could call back to the phone number [phone].  Advised pt. To ask for nurse, Arbie Cookey.  Agreed.

## 2020-04-22 NOTE — Telephone Encounter (Signed)
Requested medication (s) are due for refill today:  Yes  Requested medication (s) are on the active medication list:  Yes  Future visit scheduled:  Yes  Last Refill: Oxycodone-Acetaminophen - 03/26/20; #30; no refills  Note to Clinic:  pt. Also requested refill on Digoxin; I was not able to find this on either the active medication list or the historical medication list.  I called the pt. to discuss this, and he asked if he could call me back.  At this point he has not called back.   Can you look into his request for Digoxin please?   Requested Prescriptions  Pending Prescriptions Disp Refills   oxyCODONE-acetaminophen (PERCOCET) 10-325 MG tablet 30 tablet 0    Sig: Take 1 tablet by mouth every 8 (eight) hours as needed for pain.      Not Delegated - Analgesics:  Opioid Agonist Combinations Failed - 04/22/2020  2:48 PM      Failed - This refill cannot be delegated      Failed - Urine Drug Screen completed in last 360 days.      Passed - Valid encounter within last 6 months    Recent Outpatient Visits           1 week ago Type 2 diabetes mellitus with microalbuminuria, without long-term current use of insulin [facility])   [facility] [name], MD   2 months ago Type 2 diabetes mellitus with microalbuminuria, without long-term current use of insulin [facility])   [facility] [name], MD   2 months ago Type 2 diabetes mellitus with microalbuminuria, without long-term current use of insulin [facility])   [facility] [place], [place], PA-C   3 months ago Type 2 diabetes mellitus with microalbuminuria, without long-term current use of insulin [facility])   [facility] [name], MD   5 months ago Shortness of breath   [facility], Wendee Beavers, [place]       Future Appointments             In 1 week Caryn Section, Kirstie Peri, MD [facility]

## 2020-04-22 NOTE — Telephone Encounter (Signed)
Digoxin is for congestive heart failure.  I have no idea what he is talking about.

## 2020-04-22 NOTE — Telephone Encounter (Signed)
Pt. ret'd call.  Asked pt. About Digoxin request, since it is not on his active medication list or historical medication list.  The pt. Said that Dr. Caryn Section said he was going to call it in.  Advised that a message had been sent to the office re: the Digoxin.  Pt. Verb. Understanding.

## 2020-04-22 NOTE — Telephone Encounter (Signed)
Copied from [place] [id number]. Topic: Quick Communication - Rx Refill/Question >> Apr 22, 2020 11:49 AM Leward Quan A wrote: Medication: oxyCODONE-acetaminophen (PERCOCET) 10-325 MG tablet,  Digoxin  Has the patient contacted their pharmacy? Yes.   (Agent: If no, request that the patient contact the pharmacy for the refill.) (Agent: If yes, when and what did the pharmacy advise?)  Preferred Pharmacy (with phone number or street name): [facility]),  - [place]  Phone:  [phone] Fax:  [phone]     Agent: Please be advised that RX refills may take up to 3 business days. We ask that you follow-up with your pharmacy.

## 2020-04-23 ENCOUNTER — Encounter: Payer: Self-pay | Admitting: Thoracic Surgery (Cardiothoracic Vascular Surgery)

## 2020-04-23 ENCOUNTER — Ambulatory Visit: Payer: Medicare Other | Admitting: Thoracic Surgery (Cardiothoracic Vascular Surgery)

## 2020-04-23 ENCOUNTER — Encounter: Payer: Self-pay | Admitting: *Deleted

## 2020-04-23 ENCOUNTER — Other Ambulatory Visit: Payer: Self-pay

## 2020-04-23 ENCOUNTER — Ambulatory Visit (INDEPENDENT_AMBULATORY_CARE_PROVIDER_SITE_OTHER): Payer: Medicare Other | Admitting: Thoracic Surgery (Cardiothoracic Vascular Surgery)

## 2020-04-23 ENCOUNTER — Other Ambulatory Visit: Payer: Self-pay | Admitting: *Deleted

## 2020-04-23 ENCOUNTER — Ambulatory Visit (HOSPITAL_COMMUNITY)
Admission: RE | Admit: 2020-04-23 | Discharge: 2020-04-23 | Disposition: A | Discharge status: Home / Self Care | Payer: Medicare Other | Source: Ambulatory Visit | Attending: Thoracic Surgery (Cardiothoracic Vascular Surgery) | Admitting: Thoracic Surgery (Cardiothoracic Vascular Surgery)

## 2020-04-23 VITALS — BP 132/78 | HR 62 | Temp 97.7°F | Resp 20 | Ht 73.0 in | Wt 166.0 lb

## 2020-04-23 DIAGNOSIS — J986 Disorders of diaphragm: Secondary | ICD-10-CM | POA: Diagnosis not present

## 2020-04-23 DIAGNOSIS — Z951 Presence of aortocoronary bypass graft: Secondary | ICD-10-CM

## 2020-04-23 LAB — PULMONARY FUNCTION TEST
DL/VA % pred: 94 %
DL/VA: 3.82 ml/min/mmHg/L
DLCO unc % pred: 53 %
DLCO unc: 15.27 ml/min/mmHg
FEF 25-75 Post: 2.82 L/sec
FEF 25-75 Pre: 2.31 L/sec
FEF2575-%Change-Post: 22 %
FEF2575-%Pred-Post: 98 %
FEF2575-%Pred-Pre: 80 %
FEV1-%Change-Post: 4 %
FEV1-%Pred-Post: 73 %
FEV1-%Pred-Pre: 70 %
FEV1-Post: 2.42 L
FEV1-Pre: 2.32 L
FEV1FVC-%Change-Post: 5 %
FEV1FVC-%Pred-Pre: 104 %
FEV6-%Change-Post: -2 %
FEV6-%Pred-Post: 67 %
FEV6-%Pred-Pre: 69 %
FEV6-Post: 2.82 L
FEV6-Pre: 2.89 L
FEV6FVC-%Change-Post: 0 %
FEV6FVC-%Pred-Post: 104 %
FEV6FVC-%Pred-Pre: 103 %
FVC-%Change-Post: -1 %
FVC-%Pred-Post: 66 %
FVC-%Pred-Pre: 67 %
FVC-Post: 2.86 L
FVC-Pre: 2.91 L
Post FEV1/FVC ratio: 85 %
Post FEV6/FVC ratio: 100 %
Pre FEV1/FVC ratio: 80 %
Pre FEV6/FVC Ratio: 100 %
RV % pred: 73 %
RV: 1.89 L
TLC % pred: 62 %
TLC: 4.74 L

## 2020-04-23 MED ORDER — ALBUTEROL SULFATE (2.5 MG/3ML) 0.083% IN NEBU
2.5000 mg | INHALATION_SOLUTION | Freq: Once | RESPIRATORY_TRACT | Status: AC
  Administered 2020-04-23: 2.5 mg via RESPIRATORY_TRACT

## 2020-04-23 NOTE — Progress Notes (Signed)
     [address]       [place] [address]             [phone]       Craig York comes in for follow-up after his hospital admission.  He was originally scheduled for a right robotic assisted diaphragmatic plication but he was admitted to the hospital with blood loss anemia with a hemoglobin of 3.  Upper endoscopy revealed gastritis with stigmata of bleeding.  Biopsy was negative for cancer and Helicobacter screen was negative.  He responded appropriately to transfusions.  He has been restarted on his Plavix.  He denies any chest pain but his shortness of breath persists.  Vitals:   04/23/20 1059  BP: 132/78  Pulse: 62  Resp: 20  Temp: 97.7 F (36.5 C)  SpO2: 100%   Alert NAD Regular rate and rhythm Easy work of breathing  69 year old male status post CABG has had a longstanding right diaphragm paralysis.  Preoperative pulmonary function tests were obtained.  He is agreeable to proceed with surgery.  He is tentatively scheduled for June 10 for a right robotic assisted diaphragm plication.  Lydiah Pong Bary Leriche

## 2020-04-27 ENCOUNTER — Other Ambulatory Visit: Payer: Medicare Other

## 2020-04-29 DIAGNOSIS — E113393 Type 2 diabetes mellitus with moderate nonproliferative diabetic retinopathy without macular edema, bilateral: Secondary | ICD-10-CM | POA: Diagnosis not present

## 2020-04-30 NOTE — Progress Notes (Signed)
I,[name],acting as a scribe for Lelon Huh, MD.,have documented all relevant documentation on the behalf of Lelon Huh, MD,as directed by  Lelon Huh, MD while in the presence of Lelon Huh, MD.    Established patient visit   Patient: Craig York   DOB: 12-Dec-1950   69 y.o. Male  MRN: [id number] Visit Date: 05/03/2020  Today's healthcare provider: Lelon Huh, MD   Chief Complaint  Patient presents with   Hospitalization Follow-up   Subjective    HPI Follow up Hospitalization  Patient was admitted to [facility] on 04/12/2020 and discharged on 04/14/2020. He was treated for low hemoglobin, symptomatic anemia. He was admitted with a hemoglobin of 3.9 and ferritin of 3.  Treatment for this included 4 units of PRBC transfusion. Patient was also given IV iron. Patient was advised to continue oral iron supplement. He also had capsule endoscopy performed, and was to follow-up with GI for the results. Telephone follow up was not done. He reports good compliance with treatment. He reports this condition is improved. He feels his breathing is much better. Is not having any swelling. No chest pains. Is scheduled for diaphragm plication later this week.   ----------------------------------------------------------------------------------------- -      Medications: Outpatient Medications Prior to Visit  Medication Sig   acetaminophen (TYLENOL) 500 MG tablet Take 1,000 mg by mouth every 6 (six) hours as needed for moderate pain.   amLODipine (NORVASC) 10 MG tablet Take 10 mg by mouth daily.   aspirin EC 81 MG tablet Take 81 mg by mouth daily.   clopidogrel (PLAVIX) 75 MG tablet Take 1 tablet (75 mg total) by mouth daily.   ferrous sulfate 325 (65 FE) MG tablet Take 1 tablet (325 mg total) by mouth 2 (two) times daily with a meal.   JARDIANCE 25 MG TABS tablet TAKE 1 TABLET BY MOUTH ONCE DAILY BEFORE BREAKFAST (Patient taking differently: Take 25 mg by  mouth daily. )   metFORMIN (GLUCOPHAGE) 500 MG tablet TAKE TWO TABLETS BY MOUTH TWICE DAILY WITH A MEAL (Patient taking differently: Take 1,000 mg by mouth daily. )   metoprolol succinate (TOPROL-XL) 25 MG 24 hr tablet Take 25 mg by mouth daily.   oxyCODONE-acetaminophen (PERCOCET) 10-325 MG tablet Take 1 tablet by mouth every 8 (eight) hours as needed for pain.   pantoprazole (PROTONIX) 40 MG tablet Take 1 tablet (40 mg total) by mouth daily.   rosuvastatin (CRESTOR) 20 MG tablet Take 1 tablet (20 mg total) by mouth daily at 6 PM. (Patient taking differently: Take 20 mg by mouth daily. )   sildenafil (VIAGRA) 100 MG tablet Take 0.5-1 tablets (50-100 mg total) by mouth daily as needed for erectile dysfunction.   SYSTANE COMPLETE 0.6 % SOLN Place 1 drop into both eyes 3 (three) times daily as needed (dry/irritated eyes.).   tadalafil (CIALIS) 10 MG tablet Take 1 tablet (10 mg total) by mouth daily as needed for erectile dysfunction.   No facility-administered medications prior to visit.    Review of Systems  Constitutional: Negative for appetite change, chills and fever.  Respiratory: Negative for chest tightness, shortness of breath and wheezing.   Cardiovascular: Negative for chest pain and palpitations.  Gastrointestinal: Negative for abdominal pain, nausea and vomiting.      Objective    BP 130/71 (BP Location: Left Arm, Patient Position: Sitting, Cuff Size: Normal)    Pulse 65    Temp (!) 97.5 F (36.4 C) (Temporal)    Resp  16    Wt 179 lb (81.2 kg)    BMI 23.62 kg/m    Physical Exam   General: Appearance:    Well developed, well nourished male in no acute distress  Eyes:    PERRL, conjunctiva/corneas clear, EOM's intact       Lungs:     Clear to auscultation bilaterally, respirations unlabored  Heart:    Normal heart rate. Normal rhythm. No murmurs, rubs, or gallops.   MS:   All extremities are intact.   Neurologic:   Awake, alert, oriented x 3. No apparent focal  neurological           defect.         No results found for any visits on 05/03/20.  Assessment & Plan     1. Iron deficiency anemia, unspecified iron deficiency anemia type Symptomatically greatly improved since transfusion. No active bleeding on endoscopies, but does have chronic non-h. Pylori gastritis.  - CBC - Ferritin  2. Essential (primary) hypertension Well controlled.  Continue current medications.   - Comprehensive metabolic panel  3. ED (erectile dysfunction) of organic origin He states he has to take 2 x 10mg  tadalafil and requests change to 20mg  tablets.  - tadalafil (CIALIS) 20 MG tablet; Take 1 tablet (20 mg total) by mouth daily as needed for erectile dysfunction.  Dispense: 10 tablet; Refill: 5   No follow-ups on file.         Lelon Huh, MD  [facility] [phone] (phone) [phone] (fax)  [place]

## 2020-05-03 ENCOUNTER — Telehealth: Payer: Self-pay | Admitting: Family Medicine

## 2020-05-03 ENCOUNTER — Other Ambulatory Visit: Payer: Self-pay

## 2020-05-03 ENCOUNTER — Ambulatory Visit (INDEPENDENT_AMBULATORY_CARE_PROVIDER_SITE_OTHER): Payer: Medicare Other | Admitting: Family Medicine

## 2020-05-03 ENCOUNTER — Encounter: Payer: Self-pay | Admitting: Family Medicine

## 2020-05-03 VITALS — BP 130/71 | HR 65 | Temp 97.5°F | Resp 16 | Wt 179.0 lb

## 2020-05-03 DIAGNOSIS — I10 Essential (primary) hypertension: Secondary | ICD-10-CM | POA: Diagnosis not present

## 2020-05-03 DIAGNOSIS — D509 Iron deficiency anemia, unspecified: Secondary | ICD-10-CM | POA: Diagnosis not present

## 2020-05-03 DIAGNOSIS — D500 Iron deficiency anemia secondary to blood loss (chronic): Secondary | ICD-10-CM

## 2020-05-03 DIAGNOSIS — N529 Male erectile dysfunction, unspecified: Secondary | ICD-10-CM | POA: Diagnosis not present

## 2020-05-03 DIAGNOSIS — E1129 Type 2 diabetes mellitus with other diabetic kidney complication: Secondary | ICD-10-CM

## 2020-05-03 MED ORDER — PANTOPRAZOLE SODIUM 40 MG PO TBEC: 40.0000 mg | DELAYED_RELEASE_TABLET | Freq: Every day | ORAL | 1 refills | Status: DC

## 2020-05-03 MED ORDER — CLOPIDOGREL BISULFATE 75 MG PO TABS: 75.0000 mg | ORAL_TABLET | Freq: Every day | ORAL | 1 refills | Status: DC

## 2020-05-03 MED ORDER — ROSUVASTATIN CALCIUM 20 MG PO TABS: 20.0000 mg | ORAL_TABLET | Freq: Every day | ORAL | 1 refills | Status: DC

## 2020-05-03 MED ORDER — TADALAFIL 20 MG PO TABS: 20.0000 mg | ORAL_TABLET | Freq: Every day | ORAL | 5 refills | Status: DC | PRN

## 2020-05-03 MED ORDER — EMPAGLIFLOZIN 25 MG PO TABS: ORAL_TABLET | ORAL | 1 refills | Status: DC

## 2020-05-03 MED ORDER — METOPROLOL SUCCINATE ER 25 MG PO TB24: 25.0000 mg | ORAL_TABLET | Freq: Every day | ORAL | 1 refills | Status: DC

## 2020-05-03 NOTE — Telephone Encounter (Signed)
Refilled medication [facility] for 6 months. Patient was seen today for in office visit with Dr Caryn Section.   CM

## 2020-05-03 NOTE — Pre-Procedure Instructions (Signed)
[id number] - [place], [place] - [address] [place] [address] Phone: [phone] Fax: [place], [place] [address] [place] [address] Phone: [phone] Fax: [phone]  [place], [place] [facility] [address] [place] [address] [place] [address] Phone: [phone] Fax: [phone]    Your procedure is scheduled on Thurs., May 06, 2020 from 7:45AM-11:15AM  Report to [facility]" at 5:45AM   Call this number if you have problems the morning of surgery:  [phone]   Remember:  Do not eat or drink after midnight on June 9th    Take these medicines the morning of surgery with A SIP OF WATER: AmLODipine (NORVASC)     Metoprolol succinate (TOPROL-XL) Pantoprazole (PROTONIX) Rosuvastatin (CRESTOR)      If Needed: Acetaminophen (TYLENOL)  OxyCODONE-acetaminophen (PERCOCET) Systane eye Drops  Follow your surgeon's instructions on when to stop Aspirin and Plavix.  If no instructions were given by your surgeon then you will need to call the office to get those instructions.    As of today, STOP taking all Aspirin (unless instructed by your doctor) and Other Aspirin containing products, Vitamins, Fish oils, and Herbal medications. Also stop all NSAIDS i.e. Advil, Ibuprofen, Motrin, Aleve, Anaprox, Naproxen, BC, Goody Powders, and all Supplements.   . Take last dose of Empagliflozin (JARDIANCE) on June 8th.  . Do not take MetFORMIN (GLUCOPHAGE) the morning of surgery.  How to Manage Your Diabetes Before and After Surgery  Why is it important to control my blood sugar before and after surgery? . Improving blood sugar levels before and after surgery helps healing and can limit problems. . A way of improving blood sugar control is eating a healthy diet by: o  Eating less sugar and carbohydrates o  Increasing  activity/exercise o  Talking with your doctor about reaching your blood sugar goals . High blood sugars (greater than 180 mg/dL) can raise your risk of infections and slow your recovery, so you will need to focus on controlling your diabetes during the weeks before surgery. . Make sure that the doctor who takes care of your diabetes knows about your planned surgery including the date and location.  How do I manage my blood sugar before surgery? . Check your blood sugar at least 4 times a day, starting 2 days before surgery, to make sure that the level is not too high or low. o Check your blood sugar the morning of your surgery when you wake up and every 2 hours until you get to the Short Stay unit. . If your blood sugar is less than 70 mg/dL, you will need to treat for low blood sugar: o Do not take insulin. o Treat a low blood sugar (less than 70 mg/dL) with  cup of clear juice (cranberry or apple), 4 glucose tablets, OR glucose gel. Recheck blood sugar in 15 minutes after treatment (to make sure it is greater than 70 mg/dL). If your blood sugar is not greater than 70 mg/dL on recheck, call [phone] o  for further instructions.                                          . If your CBG is greater than 220 mg/dL, inform the staff at  Short Stay upon arrival.  . If you are admitted to the hospital after surgery: o Your blood sugar will be checked by the staff and you will probably be given insulin after surgery (instead of oral diabetes medicines) to make sure you have good blood sugar levels. o The goal for blood sugar control after surgery is 80-180 mg/dL.  Reviewed and Endorsed by [facility] Patient Education Committee, August 2015   No Smoking of any kind, Tobacco, or Alcohol products 24 hours prior to your procedure. If you use a Cpap at night, you may bring all equipment for your overnight stay.  Remember:  Do not wear jewelry.  Do not wear lotions, powders, colognes, or  deodorant.  Do not shave 48 hours prior to surgery.  Men may shave face and neck.  Do not bring valuables to the hospital.  [facility] is not responsible for any belongings or valuables.  Contacts, dentures or bridgework may not be worn into surgery.    For patients admitted to the hospital, discharge time will be determined by your treatment team.  Patients discharged the day of surgery will not be allowed to drive home.    Special instructions:   [facility]- Preparing For Surgery  Before surgery, you can play an important role. Because skin is not sterile, your skin needs to be as free of germs as possible. You can reduce the number of germs on your skin by washing with CHG (chlorahexidine gluconate) Soap before surgery.  CHG is an antiseptic cleaner which kills germs and bonds with the skin to continue killing germs even after washing.    Oral Hygiene is also important to reduce your risk of infection.  Remember - BRUSH YOUR TEETH THE MORNING OF SURGERY WITH YOUR REGULAR TOOTHPASTE  Please do not use if you have an allergy to CHG or antibacterial soaps. If your skin becomes reddened/irritated stop using the CHG.  Do not shave (including legs and underarms) for at least 48 hours prior to first CHG shower. It is OK to shave your face.  Please follow these instructions carefully.   1. Shower the NIGHT BEFORE SURGERY and the MORNING OF SURGERY with CHG.   2. If you chose to wash your hair, wash your hair first as usual with your normal shampoo.  3. After you shampoo, rinse your hair and body thoroughly to remove the shampoo.  4. Use CHG as you would any other liquid soap. You can apply CHG directly to the skin and wash gently with a scrungie or a clean washcloth.   5. Apply the CHG Soap to your body ONLY FROM THE NECK DOWN.  Do not use on open wounds or open sores. Avoid contact with your eyes, ears, mouth and genitals (private parts). Wash Face and genitals (private parts)  with  your normal soap.  6. Wash thoroughly, paying special attention to the area where your surgery will be performed.  7. Thoroughly rinse your body with warm water from the neck down.  8. DO NOT shower/wash with your normal soap after using and rinsing off the CHG Soap.  9. Pat yourself dry with a CLEAN TOWEL.  10. Wear CLEAN PAJAMAS to bed the night before surgery, wear comfortable clothes the morning of surgery  11. Place CLEAN SHEETS on your bed the night of your first shower and DO NOT SLEEP WITH PETS.  Day of Surgery:  Do not apply any deodorants/lotions.  Please wear clean clothes to the hospital/surgery center.  Remember to brush your teeth WITH YOUR REGULAR TOOTHPASTE.  Please read over the following fact sheets that you were given.

## 2020-05-03 NOTE — Patient Instructions (Signed)
.   Please review the attached list of medications and notify my office if there are any errors.   . Please bring all of your medications to every appointment so we can make sure that our medication list is the same as yours.   

## 2020-05-03 NOTE — Telephone Encounter (Signed)
[facility] faxed refill request for the following medications:  JARDIANCE TABS tablet amLODipine tablet rosuvastatin tablet metoprolol succinate 24 hr tablet clopidogrel  Tablet Pantoprazole sod ec tablet   Please advise.  Thanks, [employer]

## 2020-05-04 ENCOUNTER — Encounter (HOSPITAL_COMMUNITY): Payer: Self-pay

## 2020-05-04 ENCOUNTER — Other Ambulatory Visit: Payer: Self-pay

## 2020-05-04 ENCOUNTER — Other Ambulatory Visit
Admission: RE | Admit: 2020-05-04 | Discharge: 2020-05-04 | Disposition: A | Discharge status: Home / Self Care | Payer: Medicare Other | Source: Ambulatory Visit | Attending: Thoracic Surgery (Cardiothoracic Vascular Surgery) | Admitting: Thoracic Surgery (Cardiothoracic Vascular Surgery)

## 2020-05-04 ENCOUNTER — Telehealth: Payer: Self-pay

## 2020-05-04 ENCOUNTER — Encounter (HOSPITAL_COMMUNITY)
Admission: RE | Admit: 2020-05-04 | Discharge: 2020-05-04 | Disposition: A | Discharge status: Home / Self Care | Payer: Medicare Other | Source: Ambulatory Visit | Attending: Thoracic Surgery (Cardiothoracic Vascular Surgery) | Admitting: Thoracic Surgery (Cardiothoracic Vascular Surgery)

## 2020-05-04 ENCOUNTER — Ambulatory Visit (HOSPITAL_COMMUNITY)
Admission: RE | Admit: 2020-05-04 | Discharge: 2020-05-04 | Disposition: A | Discharge status: Home / Self Care | Payer: Medicare Other | Source: Ambulatory Visit | Attending: Thoracic Surgery (Cardiothoracic Vascular Surgery) | Admitting: Thoracic Surgery (Cardiothoracic Vascular Surgery)

## 2020-05-04 DIAGNOSIS — Z7984 Long term (current) use of oral hypoglycemic drugs: Secondary | ICD-10-CM | POA: Diagnosis not present

## 2020-05-04 DIAGNOSIS — Z20822 Contact with and (suspected) exposure to covid-19: Secondary | ICD-10-CM | POA: Diagnosis not present

## 2020-05-04 DIAGNOSIS — I252 Old myocardial infarction: Secondary | ICD-10-CM | POA: Diagnosis not present

## 2020-05-04 DIAGNOSIS — Z7902 Long term (current) use of antithrombotics/antiplatelets: Secondary | ICD-10-CM | POA: Diagnosis not present

## 2020-05-04 DIAGNOSIS — E119 Type 2 diabetes mellitus without complications: Secondary | ICD-10-CM | POA: Diagnosis not present

## 2020-05-04 DIAGNOSIS — Z955 Presence of coronary angioplasty implant and graft: Secondary | ICD-10-CM | POA: Diagnosis not present

## 2020-05-04 DIAGNOSIS — J9811 Atelectasis: Secondary | ICD-10-CM | POA: Diagnosis not present

## 2020-05-04 DIAGNOSIS — E785 Hyperlipidemia, unspecified: Secondary | ICD-10-CM | POA: Diagnosis not present

## 2020-05-04 DIAGNOSIS — Z7901 Long term (current) use of anticoagulants: Secondary | ICD-10-CM | POA: Diagnosis not present

## 2020-05-04 DIAGNOSIS — Z7982 Long term (current) use of aspirin: Secondary | ICD-10-CM | POA: Diagnosis not present

## 2020-05-04 DIAGNOSIS — Z01812 Encounter for preprocedural laboratory examination: Secondary | ICD-10-CM | POA: Diagnosis not present

## 2020-05-04 DIAGNOSIS — J986 Disorders of diaphragm: Secondary | ICD-10-CM | POA: Insufficient documentation

## 2020-05-04 DIAGNOSIS — I2510 Atherosclerotic heart disease of native coronary artery without angina pectoris: Secondary | ICD-10-CM | POA: Diagnosis not present

## 2020-05-04 DIAGNOSIS — Z79891 Long term (current) use of opiate analgesic: Secondary | ICD-10-CM | POA: Diagnosis not present

## 2020-05-04 DIAGNOSIS — I10 Essential (primary) hypertension: Secondary | ICD-10-CM | POA: Diagnosis not present

## 2020-05-04 DIAGNOSIS — Z951 Presence of aortocoronary bypass graft: Secondary | ICD-10-CM | POA: Diagnosis not present

## 2020-05-04 DIAGNOSIS — K5900 Constipation, unspecified: Secondary | ICD-10-CM | POA: Diagnosis not present

## 2020-05-04 DIAGNOSIS — Z86010 Personal history of colonic polyps: Secondary | ICD-10-CM | POA: Diagnosis not present

## 2020-05-04 DIAGNOSIS — Z79899 Other long term (current) drug therapy: Secondary | ICD-10-CM | POA: Diagnosis not present

## 2020-05-04 LAB — COMPREHENSIVE METABOLIC PANEL
ALT: 12 IU/L (ref 0–44)
AST: 18 IU/L (ref 0–40)
Albumin/Globulin Ratio: 1.6 (ref 1.2–2.2)
Albumin: 4.4 g/dL (ref 3.8–4.8)
Alkaline Phosphatase: 75 IU/L (ref 48–121)
BUN/Creatinine Ratio: 16 (ref 10–24)
BUN: 14 mg/dL (ref 8–27)
Bilirubin Total: 0.4 mg/dL (ref 0.0–1.2)
CO2: 21 mmol/L (ref 20–29)
Calcium: 9.5 mg/dL (ref 8.6–10.2)
Chloride: 105 mmol/L (ref 96–106)
Creatinine, Ser: 0.87 mg/dL (ref 0.76–1.27)
GFR calc Af Amer: 103 mL/min/{1.73_m2} (ref >59)
GFR calc non Af Amer: 89 mL/min/{1.73_m2} (ref >59)
Globulin, Total: 2.7 g/dL (ref 1.5–4.5)
Glucose: 99 mg/dL (ref 65–99)
Potassium: 4.1 mmol/L (ref 3.5–5.2)
Sodium: 140 mmol/L (ref 134–144)
Total Protein: 7.1 g/dL (ref 6.0–8.5)

## 2020-05-04 LAB — URINALYSIS, ROUTINE W REFLEX MICROSCOPIC
Bacteria, UA: NONE SEEN
Bilirubin Urine: NEGATIVE
Glucose, UA: >=500 mg/dL — AB
Hgb urine dipstick: NEGATIVE
Ketones, ur: NEGATIVE mg/dL
Leukocytes,Ua: NEGATIVE
Nitrite: NEGATIVE
Protein, ur: NEGATIVE mg/dL
Specific Gravity, Urine: 1.025 (ref 1.005–1.030)
pH: 5 (ref 5.0–8.0)

## 2020-05-04 LAB — CBC
HCT: 32.5 % — ABNORMAL LOW (ref 39.0–52.0)
Hematocrit: 32 % — ABNORMAL LOW (ref 37.5–51.0)
Hemoglobin: 9.7 g/dL — ABNORMAL LOW (ref 13.0–17.7)
Hemoglobin: 9.8 g/dL — ABNORMAL LOW (ref 13.0–17.0)
MCH: 23.7 pg — ABNORMAL LOW (ref 26.6–33.0)
MCH: 23.6 pg — ABNORMAL LOW (ref 26.0–34.0)
MCHC: 30.3 g/dL — ABNORMAL LOW (ref 31.5–35.7)
MCHC: 30.2 g/dL (ref 30.0–36.0)
MCV: 78 fL — ABNORMAL LOW (ref 79–97)
MCV: 78.3 fL — ABNORMAL LOW (ref 80.0–100.0)
Platelets: 247 10*3/uL (ref 150–450)
Platelets: 268 10*3/uL (ref 150–400)
RBC: 4.09 x10E6/uL — ABNORMAL LOW (ref 4.14–5.80)
RBC: 4.15 MIL/uL — ABNORMAL LOW (ref 4.22–5.81)
RDW: 27.2 % — ABNORMAL HIGH (ref 11.6–15.4)
RDW: 27.8 % — ABNORMAL HIGH (ref 11.5–15.5)
WBC: 4.5 10*3/uL (ref 3.4–10.8)
WBC: 4.7 10*3/uL (ref 4.0–10.5)
nRBC: 0 % (ref 0.0–0.2)

## 2020-05-04 LAB — PROTIME-INR
INR: 1 (ref 0.8–1.2)
Prothrombin Time: 13.2 seconds (ref 11.4–15.2)

## 2020-05-04 LAB — GLUCOSE, CAPILLARY: Glucose-Capillary: 111 mg/dL — ABNORMAL HIGH (ref 70–99)

## 2020-05-04 LAB — SURGICAL PCR SCREEN
MRSA, PCR: NEGATIVE
Staphylococcus aureus: NEGATIVE

## 2020-05-04 LAB — BLOOD GAS, ARTERIAL
Acid-base deficit: 3 mmol/L — ABNORMAL HIGH (ref 0.0–2.0)
Bicarbonate: 21 mmol/L (ref 20.0–28.0)
FIO2: 21
O2 Saturation: 98.7 %
Patient temperature: 37
pCO2 arterial: 35 mmHg (ref 32.0–48.0)
pH, Arterial: 7.397 (ref 7.350–7.450)
pO2, Arterial: 113 mmHg — ABNORMAL HIGH (ref 83.0–108.0)

## 2020-05-04 LAB — APTT: aPTT: 33 seconds (ref 24–36)

## 2020-05-04 LAB — FERRITIN: Ferritin: 23 ng/mL — ABNORMAL LOW (ref 30–400)

## 2020-05-04 LAB — SARS CORONAVIRUS 2 (TAT 6-24 HRS): SARS Coronavirus 2: NEGATIVE

## 2020-05-04 NOTE — Telephone Encounter (Signed)
LMTCB, PEC may give result.  

## 2020-05-04 NOTE — Telephone Encounter (Signed)
-----   Message from [name], MD sent at 05/04/2020  9:10 AM EDT ----- Blood count is much better, up to 9.7 (was 3.9 before being hospital). Need to stay on current iron supplement and follow up in September as scheduled.

## 2020-05-04 NOTE — Progress Notes (Addendum)
PCP - Dr. Lelon Huh  Cardiologist - [facility]  Chest x-ray - 05/04/20  EKG - 05/04/20  Stress Test - Denies  ECHO - 08/12/2019 (E)  Cardiac Cath - 07/21/2019 (E)  AICD-na PM-na LOOP-na  Sleep Study - Denies CPAP - Denies  LABS- 05/04/20: CBC, ABG, PT, PTT, PCR, UA, COVID 05/03/20: CMP (E)  ASA- LD- 6/8 Plavix- LD- 6/4  ERAS- No  HA1C- 04/09/20- 7.0 (E) Fasting Blood Sugar - 105-142, today 111 Checks Blood Sugar _1-2____ times a week  Pt to have T/S drawn day of surgery due to recent blood transfusion.  Anesthesia- Yes- recent hospitalization for hgb 3.9, pt received 4 units of blood.  Pt denies having chest pain, sob, or fever at this time. All instructions explained to the pt, with a verbal understanding of the material. Pt agrees to go over the instructions while at home for a better understanding. Pt also instructed to self quarantine after being tested for COVID-19. The opportunity to ask questions was provided.   Coronavirus Screening  Have you experienced the following symptoms:  Cough yes/no: No Fever (>100.73F)  yes/no: No Runny nose yes/no: No Sore throat yes/no: No Difficulty breathing/shortness of breath  yes/no: No  Have you or a family member traveled in the last 14 days and where? yes/no: No   If the patient indicates "YES" to the above questions, their PAT will be rescheduled to limit the exposure to others and, the surgeon will be notified. THE PATIENT WILL NEED TO BE ASYMPTOMATIC FOR 14 DAYS.   If the patient is not experiencing any of these symptoms, the PAT nurse will instruct them to NOT bring anyone with them to their appointment since they may have these symptoms or traveled as well.   Please remind your patients and families that hospital visitation restrictions are in effect and the importance of the restrictions.

## 2020-05-05 ENCOUNTER — Telehealth: Payer: Self-pay | Admitting: *Deleted

## 2020-05-05 NOTE — Telephone Encounter (Signed)
Reviewed lab results and physician's note with the patient. At this time he requested refills pantoprazole and Jardiance stating [facility] reported on 05/03/20 they had not received the order. Will call [facility]. Orders for these medications were sent and received Monday 05/03/20. Spoke with Ren orders will be processed and mailed to patient tomorrow. Called local [facility] [phone] to cancel Jardiance order from 04/27/20. Patient will have 2 additional refills on file.

## 2020-05-05 NOTE — Anesthesia Preprocedure Evaluation (Addendum)
Anesthesia Evaluation  Patient identified by MRN, date of birth, ID band Patient awake    Reviewed: Allergy & Precautions, NPO status , Patient's Chart, lab work & pertinent test results  Airway Mallampati: I  TM Distance: >3 FB Neck ROM: Full    Dental  (+) Dental Advisory Given, Missing, Poor Dentition, Chipped,    Pulmonary former smoker,    breath sounds clear to auscultation       Cardiovascular hypertension, Pt. on medications and Pt. on home beta blockers + CAD and + CABG   Rhythm:Regular Rate:Normal     Neuro/Psych  Neuromuscular disease negative psych ROS   GI/Hepatic Neg liver ROS, GERD  Medicated,  Endo/Other  diabetes, Type 2, Oral Hypoglycemic Agents  Renal/GU      Musculoskeletal  (+) Arthritis ,   Abdominal Normal abdominal exam  (+)   Peds  Hematology negative hematology ROS (+)   Anesthesia Other Findings   Reproductive/Obstetrics                           Anesthesia Physical Anesthesia Plan  ASA: III  Anesthesia Plan: General   Post-op Pain Management:    Induction: Intravenous  PONV Risk Score and Plan: 3 and Ondansetron, Dexamethasone and Midazolam  Airway Management Planned: Double Lumen EBT  Additional Equipment: Arterial line  Intra-op Plan:   Post-operative Plan: Extubation in OR  Informed Consent: I have reviewed the patients History and Physical, chart, labs and discussed the procedure including the risks, benefits and alternatives for the proposed anesthesia with the patient or authorized representative who has indicated his/her understanding and acceptance.     Dental advisory given  Plan Discussed with: CRNA  Anesthesia Plan Comments: (PAT note written 05/05/2020 by Myra Gianotti, PA-C. )      Anesthesia Quick Evaluation

## 2020-05-05 NOTE — Progress Notes (Addendum)
Anesthesia Chart Review:  Case: [id number] Date/Time: 05/06/20 0715   Procedure: XI ROBOTIC ASSISTED THORASCOPY-DIAPHRAGM PLICATION (Right )   Anesthesia type: General   Pre-op diagnosis: DIAPHRAGM PARALYSIS   Location: [facility] OR ROOM 10 / [place] [facility]   Surgeons: Lajuana Matte, MD      DISCUSSION: Patient is a 69 year old male scheduled for the above procedure. 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 Procedure was initially scheduled for 04/19/20, but 04/09/20 CBC for SOB/lightheadedness showed critical low HGB of 3.9. He was admitted to [facility] 04/12/20-04/14/20. He received a total of 4 units PRBC. EGD showed friable gastric inflammation. He was started on PPI and iron resumed. Capsule endoscopy ordered, but no results seen in [facility]. HGB at discharge was 8.0, and up to 9.8 on 05/04/20.  History includes CAD (ACS, s/p DES LAD, stent-jailed small D2, medical therapy 02/04/08; s/p CABG 08/12/19: LIMA-LAD, SVG-PDA, free RIMA-OM, SVG-DIAG), HTN, DM2, HLD, iron deficiency anemia (symptomatic anemia/GI bleed 06/2008, 03/2020). + COVID-19 11/07/19.   Message sent to Dr. Kipp Brood and Levonne Spiller, RN regarding HGB 9.8 and 6/8 EKG report.  He is s/p CABG on 08/12/19. He denied chest pain at PAT RN visit. (UPDATE: Response for Craig York, Dr. Kipp Brood has reviewed EKG, no additional recommendations given.)   Last Plavix 04/30/20, last ASA 05/04/20.   05/04/20 presurgical COVID-19 test negative. Anesthesia team and surgeon to evaluate on the day of surgery.    VS: BP 118/67   Pulse 74   Temp 37 C (Oral)   Resp 18   Ht 6\' 1"  (1.854 m)   Wt 81.2 kg   SpO2 100%   BMI 23.62 kg/m     PROVIDERS: [name], MD is PCP  - Lujean Amel, MD is cardiologist Jefm Bryant, see [place]). No note is viewable from 04/21/20 encounter, last available note seen is from 12/25/19.  Orvan Seen, Glenice Bow, MD is CT surgeon (for CABG). Referred to Melodie Bouillon, MD for post-CABG elevated right  hemidiaphragm.  - [name], MD is GI. Next appointment is scheduled for 07/13/20.    LABS: Preoperative labs noted. HGB still low, but improved since May. He is for T&S on the day of surgery. A1c 7.0% on 04/09/20, down from 9.7%.  (all labs ordered are listed, but only abnormal results are displayed)  Labs Reviewed  GLUCOSE, CAPILLARY - Abnormal; Notable for the following components:      Result Value   Glucose-Capillary 111 (*)    All other components within normal limits  BLOOD GAS, ARTERIAL - Abnormal; Notable for the following components:   pO2, Arterial 113 (*)    Acid-base deficit 3.0 (*)    Allens test (pass/fail) BRACHIAL ARTERY (*)    All other components within normal limits  CBC - Abnormal; Notable for the following components:   RBC 4.15 (*)    Hemoglobin 9.8 (*)    HCT 32.5 (*)    MCV 78.3 (*)    MCH 23.6 (*)    RDW 27.8 (*)    All other components within normal limits  URINALYSIS, ROUTINE W REFLEX MICROSCOPIC - Abnormal; Notable for the following components:   Glucose, UA >=500 (*)    All other components within normal limits  SURGICAL PCR SCREEN  APTT  PROTIME-INR    OTHER: PFTs 04/23/20: FVC 2.91 (67%), post 2.86 (66%). FEV1 2.32 (70%), post 2.42 (73%). DLCO unc 15.27 (53%).   Capsule Study 04/13/20: Pending.  EGD 04/13/20: Impression: -  Normal  esophagus. - Atrophic gastritis. Biopsied. - Normal examined duodenum. - The stomach was friable and bleed with just a water flush.  Colonoscopy 04/23/20: Impression: - Non-bleeding internal hemorrhoids. - No specimens collected.   IMAGES: CXR 05/04/20: FINDINGS: Normal heart size post CABG and coronary stenting. Mediastinal contours and pulmonary vascularity normal. Chronic elevation of RIGHT diaphragm with bowel interposition. Subsegmental atelectasis RIGHT base. Lungs otherwise clear. No pleural effusion or pneumothorax. Mild osseous demineralization. IMPRESSION: Chronic elevation of RIGHT diaphragm  with mild RIGHT basilar atelectasis. Post CABG and coronary stenting.  DG Sniff Test 09/10/19: IMPRESSION: Fluoroscopic imaging features compatible with paralysis of the right hemidiaphragm.   EKG: 05/04/20:  Sinus bradycardia at 56 bpm  T wave abnormality, consider anterolateral ischemia Abnormal ECG Since last tracing rate slower and anterolateral T wave abnormality is new Confirmed by Fransico Him ([id number]) on 05/04/2020 6:41:21 PM - 11/09/19 EKG showed T wave inversion in high lateral leads and more non-specific T wave in precordial leads.    CV: TEE (Intra-Op CABG) 08/12/19); POST-OP IMPRESSIONS  - Left Ventricle: has normal systolic function, with an ejection fraction  of  60%. The cavity size was normal. The wall motion is normal.  - Aorta: there is no dissection present in the aorta.  - Aortic Valve: The AV is tricuspid. Mild thickening and mild calcification of the AV. No AR or AS noted. The aortic valve appears unchanged from pre-bypass.  - Mitral Valve: MV is normal in structure. Mild thickening and mild calcification of the MV leaflet. Mild mitral annular calcification present. Trivial MR. The mitral valve appears unchanged from pre-bypass.  - Tricuspid Valve: TV is normal in structure. Trivial TR. The tricuspid valve appears unchanged from pre-bypass.   Carotid US 08/11/19: Summary:  Right Carotid: Velocities in the right ICA are consistent with a 1-39%  stenosis.  Left Carotid: Velocities in the left ICA are consistent with a 1-39%  stenosis.   Last cardiac cath 07/21/19, pre-CABG.    Past Medical History:  Diagnosis Date  . Anginal pain ([place])   . Benign neoplasm of descending colon   . BPH (benign prostatic hyperplasia)   . Coronary artery disease   . Diabetes mellitus without complication ([place])    Type II  . Hemorrhage of gastrointestinal tract 07/23/2008  . Hyperlipidemia   . Hypertension   . Iron deficiency anemia   . Myocardial infarction ([place])   .  Polyp of sigmoid colon   . Symptomatic anemia 04/12/2020    Past Surgical History:  Procedure Laterality Date  . CARDIAC CATHETERIZATION    . chest surgery for fungal infection    . COLONOSCOPY WITH PROPOFOL N/A 07/10/2017   Procedure: COLONOSCOPY WITH PROPOFOL;  Surgeon: [name], MD;  Location: [facility] ENDOSCOPY;  Service: Endoscopy;  Laterality: N/A;  . COLONOSCOPY WITH PROPOFOL N/A 04/13/2020   Procedure: COLONOSCOPY WITH PROPOFOL;  Surgeon: [name], MD;  Location: [facility] ENDOSCOPY;  Service: Endoscopy;  Laterality: N/A;  . CORONARY ARTERY BYPASS GRAFT N/A 08/12/2019   Procedure: CORONARY ARTERY BYPASS GRAFTING (CABG)X 4;  Surgeon: [name], MD;  Location: [place];  Service: Open Heart Surgery;  Laterality: N/A;  CABG x  4  using bilateral internal mammary arteries and endoscopically harvested left saphenous vein  . ESOPHAGOGASTRODUODENOSCOPY (EGD) WITH PROPOFOL N/A 07/10/2017   Procedure: ESOPHAGOGASTRODUODENOSCOPY (EGD) WITH PROPOFOL;  Surgeon: [name], MD;  Location: [facility] ENDOSCOPY;  Service: Endoscopy;  Laterality: N/A;  . ESOPHAGOGASTRODUODENOSCOPY (EGD) WITH PROPOFOL N/A 04/13/2020   Procedure: ESOPHAGOGASTRODUODENOSCOPY (EGD)  WITH PROPOFOL;  Surgeon: [name], MD;  Location: [facility] ENDOSCOPY;  Service: Endoscopy;  Laterality: N/A;  . GIVENS CAPSULE STUDY N/A 04/14/2020   Procedure: GIVENS CAPSULE STUDY;  Surgeon: Jonathon Bellows, MD;  Location: [facility] ENDOSCOPY;  Service: Gastroenterology;  Laterality: N/A;  . HERNIA REPAIR    . LEFT HEART CATH AND CORONARY ANGIOGRAPHY Left 07/21/2019   Procedure: LEFT HEART CATH AND CORONARY ANGIOGRAPHY;  Surgeon: [name], MD;  Location: [place] CV LAB;  Service: Cardiovascular;  Laterality: Left;  . TEE WITHOUT CARDIOVERSION N/A 08/12/2019   Procedure: TRANSESOPHAGEAL ECHOCARDIOGRAM (TEE);  Surgeon: [name], MD;  Location: [place];  Service: Open Heart Surgery;  Laterality: N/A;    MEDICATIONS: . acetaminophen  (TYLENOL) 500 MG tablet  . amLODipine (NORVASC) 10 MG tablet  . aspirin EC 81 MG tablet  . clopidogrel (PLAVIX) 75 MG tablet  . empagliflozin (JARDIANCE) 25 MG TABS tablet  . ferrous sulfate 325 (65 FE) MG tablet  . metFORMIN (GLUCOPHAGE) 500 MG tablet  . metoprolol succinate (TOPROL-XL) 25 MG 24 hr tablet  . oxyCODONE-acetaminophen (PERCOCET) 10-325 MG tablet  . pantoprazole (PROTONIX) 40 MG tablet  . rosuvastatin (CRESTOR) 20 MG tablet  . sildenafil (VIAGRA) 100 MG tablet  . SYSTANE COMPLETE 0.6 % SOLN  . tadalafil (CIALIS) 20 MG tablet   No current facility-administered medications for this encounter.    Myra Gianotti, PA-C Surgical Short Stay/Anesthesiology [facility] Phone [phone] [facility] Phone [phone] 05/05/2020 12:32 PM

## 2020-05-05 NOTE — Telephone Encounter (Signed)
See yesterday's TE

## 2020-05-05 NOTE — Telephone Encounter (Signed)
Attempted to reach pt. By phone. Message left to call back for results.

## 2020-05-06 ENCOUNTER — Inpatient Hospital Stay (HOSPITAL_COMMUNITY): Payer: Medicare Other | Admitting: Vascular Surgery

## 2020-05-06 ENCOUNTER — Inpatient Hospital Stay (HOSPITAL_COMMUNITY): Payer: Medicare Other

## 2020-05-06 ENCOUNTER — Inpatient Hospital Stay (HOSPITAL_COMMUNITY)
Admission: RE | Admit: 2020-05-06 | Discharge: 2020-05-08 | DRG: 165 | Disposition: A | Discharge status: Home / Self Care | Payer: Medicare Other | Attending: Thoracic Surgery (Cardiothoracic Vascular Surgery) | Admitting: Thoracic Surgery (Cardiothoracic Vascular Surgery)

## 2020-05-06 ENCOUNTER — Other Ambulatory Visit: Payer: Self-pay

## 2020-05-06 ENCOUNTER — Ambulatory Visit: Payer: Medicare Other | Admitting: Gastroenterology

## 2020-05-06 ENCOUNTER — Encounter (HOSPITAL_COMMUNITY): Payer: Self-pay | Admitting: Thoracic Surgery (Cardiothoracic Vascular Surgery)

## 2020-05-06 ENCOUNTER — Encounter (HOSPITAL_COMMUNITY)
Admission: RE | Disposition: A | Discharge status: Home / Self Care | Payer: Self-pay | Source: Home / Self Care | Attending: Thoracic Surgery (Cardiothoracic Vascular Surgery)

## 2020-05-06 ENCOUNTER — Inpatient Hospital Stay (HOSPITAL_COMMUNITY): Payer: Medicare Other | Admitting: Certified Registered Nurse Anesthetist

## 2020-05-06 DIAGNOSIS — J986 Disorders of diaphragm: Principal | ICD-10-CM | POA: Diagnosis present

## 2020-05-06 DIAGNOSIS — J939 Pneumothorax, unspecified: Secondary | ICD-10-CM

## 2020-05-06 DIAGNOSIS — I10 Essential (primary) hypertension: Secondary | ICD-10-CM | POA: Diagnosis not present

## 2020-05-06 DIAGNOSIS — I2510 Atherosclerotic heart disease of native coronary artery without angina pectoris: Secondary | ICD-10-CM | POA: Diagnosis present

## 2020-05-06 DIAGNOSIS — K5900 Constipation, unspecified: Secondary | ICD-10-CM | POA: Diagnosis present

## 2020-05-06 DIAGNOSIS — Z7984 Long term (current) use of oral hypoglycemic drugs: Secondary | ICD-10-CM

## 2020-05-06 DIAGNOSIS — Z955 Presence of coronary angioplasty implant and graft: Secondary | ICD-10-CM

## 2020-05-06 DIAGNOSIS — Z20822 Contact with and (suspected) exposure to covid-19: Secondary | ICD-10-CM | POA: Diagnosis not present

## 2020-05-06 DIAGNOSIS — Z4682 Encounter for fitting and adjustment of non-vascular catheter: Secondary | ICD-10-CM | POA: Diagnosis not present

## 2020-05-06 DIAGNOSIS — Z7901 Long term (current) use of anticoagulants: Secondary | ICD-10-CM

## 2020-05-06 DIAGNOSIS — I252 Old myocardial infarction: Secondary | ICD-10-CM | POA: Diagnosis not present

## 2020-05-06 DIAGNOSIS — Z79899 Other long term (current) drug therapy: Secondary | ICD-10-CM

## 2020-05-06 DIAGNOSIS — Z951 Presence of aortocoronary bypass graft: Secondary | ICD-10-CM | POA: Diagnosis not present

## 2020-05-06 DIAGNOSIS — Z7902 Long term (current) use of antithrombotics/antiplatelets: Secondary | ICD-10-CM | POA: Diagnosis not present

## 2020-05-06 DIAGNOSIS — Z09 Encounter for follow-up examination after completed treatment for conditions other than malignant neoplasm: Secondary | ICD-10-CM

## 2020-05-06 DIAGNOSIS — J984 Other disorders of lung: Secondary | ICD-10-CM | POA: Diagnosis not present

## 2020-05-06 DIAGNOSIS — Z86010 Personal history of colonic polyps: Secondary | ICD-10-CM | POA: Diagnosis not present

## 2020-05-06 DIAGNOSIS — E785 Hyperlipidemia, unspecified: Secondary | ICD-10-CM | POA: Diagnosis not present

## 2020-05-06 DIAGNOSIS — N400 Enlarged prostate without lower urinary tract symptoms: Secondary | ICD-10-CM | POA: Diagnosis present

## 2020-05-06 DIAGNOSIS — Z79891 Long term (current) use of opiate analgesic: Secondary | ICD-10-CM | POA: Diagnosis not present

## 2020-05-06 DIAGNOSIS — E119 Type 2 diabetes mellitus without complications: Secondary | ICD-10-CM | POA: Diagnosis not present

## 2020-05-06 DIAGNOSIS — Z7982 Long term (current) use of aspirin: Secondary | ICD-10-CM

## 2020-05-06 HISTORY — PX: INTERCOSTAL NERVE BLOCK: SHX5021

## 2020-05-06 LAB — GLUCOSE, CAPILLARY
Glucose-Capillary: 100 mg/dL — ABNORMAL HIGH (ref 70–99)
Glucose-Capillary: 153 mg/dL — ABNORMAL HIGH (ref 70–99)
Glucose-Capillary: 170 mg/dL — ABNORMAL HIGH (ref 70–99)
Glucose-Capillary: 99 mg/dL (ref 70–99)

## 2020-05-06 LAB — PREPARE RBC (CROSSMATCH)

## 2020-05-06 SURGERY — XI ROBOTIC ASSISTED REPAIR OF DIAPHRAGMATIC HERNIA
Anesthesia: General | Site: Chest | Laterality: Right

## 2020-05-06 MED ORDER — SODIUM CHLORIDE FLUSH 0.9 % IV SOLN
INTRAVENOUS | Status: DC | PRN
  Administered 2020-05-06: 100 mL

## 2020-05-06 MED ORDER — HYDROMORPHONE HCL 1 MG/ML IJ SOLN
INTRAMUSCULAR | Status: AC
  Filled 2020-05-06: qty 1

## 2020-05-06 MED ORDER — PANTOPRAZOLE SODIUM 40 MG PO TBEC
40.0000 mg | DELAYED_RELEASE_TABLET | Freq: Every day | ORAL | Status: DC
  Administered 2020-05-07 – 2020-05-08 (×2): 40 mg via ORAL
  Filled 2020-05-06 (×2): qty 1

## 2020-05-06 MED ORDER — KETOROLAC TROMETHAMINE 15 MG/ML IJ SOLN
15.0000 mg | Freq: Four times a day (QID) | INTRAMUSCULAR | Status: AC
  Administered 2020-05-06 – 2020-05-08 (×8): 15 mg via INTRAVENOUS
  Filled 2020-05-06 (×8): qty 1

## 2020-05-06 MED ORDER — PROPOFOL 10 MG/ML IV BOLUS
INTRAVENOUS | Status: DC | PRN
  Administered 2020-05-06: 170 mg via INTRAVENOUS

## 2020-05-06 MED ORDER — ONDANSETRON HCL 4 MG/2ML IJ SOLN
INTRAMUSCULAR | Status: AC
  Filled 2020-05-06: qty 2

## 2020-05-06 MED ORDER — ACETAMINOPHEN 500 MG PO TABS
1000.0000 mg | ORAL_TABLET | Freq: Four times a day (QID) | ORAL | Status: DC
  Administered 2020-05-06 – 2020-05-08 (×8): 1000 mg via ORAL
  Filled 2020-05-06 (×8): qty 2

## 2020-05-06 MED ORDER — ACETAMINOPHEN 160 MG/5ML PO SOLN: 1000.0000 mg | Freq: Four times a day (QID) | ORAL | Status: DC

## 2020-05-06 MED ORDER — ONDANSETRON HCL 4 MG/2ML IJ SOLN: 4.0000 mg | Freq: Four times a day (QID) | INTRAMUSCULAR | Status: DC | PRN

## 2020-05-06 MED ORDER — INSULIN ASPART 100 UNIT/ML ~~LOC~~ SOLN
0.0000 [IU] | Freq: Four times a day (QID) | SUBCUTANEOUS | Status: DC
  Administered 2020-05-06 – 2020-05-07 (×2): 4 [IU] via SUBCUTANEOUS

## 2020-05-06 MED ORDER — MEPERIDINE HCL 25 MG/ML IJ SOLN: 6.2500 mg | INTRAMUSCULAR | Status: DC | PRN

## 2020-05-06 MED ORDER — AMLODIPINE BESYLATE 10 MG PO TABS
10.0000 mg | ORAL_TABLET | Freq: Every day | ORAL | Status: DC
  Administered 2020-05-07 – 2020-05-08 (×2): 10 mg via ORAL
  Filled 2020-05-06 (×2): qty 1

## 2020-05-06 MED ORDER — SUGAMMADEX SODIUM 200 MG/2ML IV SOLN
INTRAVENOUS | Status: DC | PRN
Start: 2020-05-06 — End: 2020-05-06
  Administered 2020-05-06: 200 mg via INTRAVENOUS

## 2020-05-06 MED ORDER — VANCOMYCIN HCL IN DEXTROSE 1-5 GM/200ML-% IV SOLN
1000.0000 mg | Freq: Two times a day (BID) | INTRAVENOUS | Status: AC
  Administered 2020-05-06: 1000 mg via INTRAVENOUS
  Filled 2020-05-06: qty 200

## 2020-05-06 MED ORDER — DEXAMETHASONE SODIUM PHOSPHATE 10 MG/ML IJ SOLN
INTRAMUSCULAR | Status: DC | PRN
  Administered 2020-05-06: 5 mg via INTRAVENOUS

## 2020-05-06 MED ORDER — METOPROLOL SUCCINATE ER 25 MG PO TB24
25.0000 mg | ORAL_TABLET | Freq: Every day | ORAL | Status: DC
  Administered 2020-05-07 – 2020-05-08 (×2): 25 mg via ORAL
  Filled 2020-05-06 (×2): qty 1

## 2020-05-06 MED ORDER — HYDRALAZINE HCL 20 MG/ML IJ SOLN
5.0000 mg | INTRAMUSCULAR | Status: DC | PRN
  Administered 2020-05-06: 5 mg via INTRAVENOUS

## 2020-05-06 MED ORDER — DEXAMETHASONE SODIUM PHOSPHATE 10 MG/ML IJ SOLN
INTRAMUSCULAR | Status: AC
  Filled 2020-05-06: qty 1

## 2020-05-06 MED ORDER — LIDOCAINE 2% (20 MG/ML) 5 ML SYRINGE
INTRAMUSCULAR | Status: DC | PRN
  Administered 2020-05-06: 60 mg via INTRAVENOUS
  Administered 2020-05-06: 20 mg via INTRAVENOUS

## 2020-05-06 MED ORDER — ACETAMINOPHEN 325 MG PO TABS: 325.0000 mg | ORAL_TABLET | Freq: Once | ORAL | Status: DC | PRN

## 2020-05-06 MED ORDER — TRAMADOL HCL 50 MG PO TABS
50.0000 mg | ORAL_TABLET | Freq: Four times a day (QID) | ORAL | Status: DC | PRN
  Administered 2020-05-06: 50 mg via ORAL
  Filled 2020-05-06: qty 1

## 2020-05-06 MED ORDER — LIDOCAINE 2% (20 MG/ML) 5 ML SYRINGE
INTRAMUSCULAR | Status: AC
  Filled 2020-05-06: qty 5

## 2020-05-06 MED ORDER — ONDANSETRON HCL 4 MG/2ML IJ SOLN
INTRAMUSCULAR | Status: DC | PRN
  Administered 2020-05-06: 4 mg via INTRAVENOUS

## 2020-05-06 MED ORDER — FENTANYL CITRATE (PF) 100 MCG/2ML IJ SOLN
INTRAMUSCULAR | Status: DC | PRN
  Administered 2020-05-06: 50 ug via INTRAVENOUS
  Administered 2020-05-06 (×2): 25 ug via INTRAVENOUS
  Administered 2020-05-06 (×2): 100 ug via INTRAVENOUS

## 2020-05-06 MED ORDER — PROMETHAZINE HCL 25 MG/ML IJ SOLN: 6.2500 mg | INTRAMUSCULAR | Status: DC | PRN

## 2020-05-06 MED ORDER — PHENYLEPHRINE HCL-NACL 10-0.9 MG/250ML-% IV SOLN
INTRAVENOUS | Status: DC | PRN
  Administered 2020-05-06: 30 ug/min via INTRAVENOUS

## 2020-05-06 MED ORDER — MIDAZOLAM HCL 2 MG/2ML IJ SOLN
INTRAMUSCULAR | Status: AC
  Filled 2020-05-06: qty 2

## 2020-05-06 MED ORDER — ROCURONIUM BROMIDE 10 MG/ML (PF) SYRINGE
PREFILLED_SYRINGE | INTRAVENOUS | Status: AC
  Filled 2020-05-06: qty 10

## 2020-05-06 MED ORDER — FENTANYL CITRATE (PF) 250 MCG/5ML IJ SOLN
INTRAMUSCULAR | Status: AC
  Filled 2020-05-06: qty 5

## 2020-05-06 MED ORDER — ASPIRIN EC 81 MG PO TBEC
81.0000 mg | DELAYED_RELEASE_TABLET | Freq: Every day | ORAL | Status: DC
  Administered 2020-05-07 – 2020-05-08 (×2): 81 mg via ORAL
  Filled 2020-05-06 (×2): qty 1

## 2020-05-06 MED ORDER — BUPIVACAINE HCL (PF) 0.5 % IJ SOLN
INTRAMUSCULAR | Status: AC
  Filled 2020-05-06: qty 30

## 2020-05-06 MED ORDER — 0.9 % SODIUM CHLORIDE (POUR BTL) OPTIME
TOPICAL | Status: DC | PRN
  Administered 2020-05-06: 2000 mL
  Administered 2020-05-06: 1000 mL

## 2020-05-06 MED ORDER — LACTATED RINGERS IV SOLN: INTRAVENOUS | Status: DC

## 2020-05-06 MED ORDER — LACTATED RINGERS IV SOLN: INTRAVENOUS | Status: DC | PRN

## 2020-05-06 MED ORDER — ROSUVASTATIN CALCIUM 20 MG PO TABS
20.0000 mg | ORAL_TABLET | Freq: Every day | ORAL | Status: DC
  Administered 2020-05-07: 20 mg via ORAL
  Filled 2020-05-06: qty 1

## 2020-05-06 MED ORDER — ORAL CARE MOUTH RINSE: 15.0000 mL | Freq: Once | OROMUCOSAL | Status: AC

## 2020-05-06 MED ORDER — FERROUS SULFATE 325 (65 FE) MG PO TABS
325.0000 mg | ORAL_TABLET | Freq: Every day | ORAL | Status: DC
  Administered 2020-05-07 – 2020-05-08 (×2): 325 mg via ORAL
  Filled 2020-05-06 (×2): qty 1

## 2020-05-06 MED ORDER — BISACODYL 5 MG PO TBEC
10.0000 mg | DELAYED_RELEASE_TABLET | Freq: Every day | ORAL | Status: DC
  Administered 2020-05-06 – 2020-05-07 (×2): 10 mg via ORAL
  Filled 2020-05-06 (×2): qty 2

## 2020-05-06 MED ORDER — LUNG SURGERY BOOK
Freq: Once | Status: AC
  Filled 2020-05-06: qty 1

## 2020-05-06 MED ORDER — ACETAMINOPHEN 10 MG/ML IV SOLN: 1000.0000 mg | Freq: Once | INTRAVENOUS | Status: DC | PRN

## 2020-05-06 MED ORDER — CHLORHEXIDINE GLUCONATE 0.12 % MT SOLN
15.0000 mL | Freq: Once | OROMUCOSAL | Status: AC
  Administered 2020-05-06: 15 mL via OROMUCOSAL
  Filled 2020-05-06: qty 15

## 2020-05-06 MED ORDER — ACETAMINOPHEN 160 MG/5ML PO SOLN: 325.0000 mg | Freq: Once | ORAL | Status: DC | PRN

## 2020-05-06 MED ORDER — ROCURONIUM BROMIDE 10 MG/ML (PF) SYRINGE
PREFILLED_SYRINGE | INTRAVENOUS | Status: DC | PRN
  Administered 2020-05-06: 10 mg via INTRAVENOUS
  Administered 2020-05-06: 30 mg via INTRAVENOUS
  Administered 2020-05-06: 60 mg via INTRAVENOUS

## 2020-05-06 MED ORDER — HYDRALAZINE HCL 20 MG/ML IJ SOLN
INTRAMUSCULAR | Status: AC
  Filled 2020-05-06: qty 1

## 2020-05-06 MED ORDER — HYDROMORPHONE HCL 1 MG/ML IJ SOLN
0.2500 mg | INTRAMUSCULAR | Status: DC | PRN
  Administered 2020-05-06 (×2): 0.5 mg via INTRAVENOUS

## 2020-05-06 MED ORDER — VANCOMYCIN HCL IN DEXTROSE 1-5 GM/200ML-% IV SOLN
1000.0000 mg | INTRAVENOUS | Status: AC
  Administered 2020-05-06: 1000 mg via INTRAVENOUS
  Filled 2020-05-06: qty 200

## 2020-05-06 MED ORDER — BUPIVACAINE LIPOSOME 1.3 % IJ SUSP
20.0000 mL | Freq: Once | INTRAMUSCULAR | Status: DC
  Filled 2020-05-06: qty 20

## 2020-05-06 MED ORDER — SENNOSIDES-DOCUSATE SODIUM 8.6-50 MG PO TABS
1.0000 | ORAL_TABLET | Freq: Every day | ORAL | Status: DC
  Filled 2020-05-06: qty 1

## 2020-05-06 SURGICAL SUPPLY — 104 items
ADH SKN CLS APL DERMABOND .7 (GAUZE/BANDAGES/DRESSINGS) ×2
APL PRP STRL LF DISP 70% ISPRP (MISCELLANEOUS) ×2
BARD PTFE Felt 1" x 6" ×4 IMPLANT
BLADE CLIPPER SURG (BLADE) ×1 IMPLANT
BNDG COHESIVE 6X5 TAN STRL LF (GAUZE/BANDAGES/DRESSINGS) ×4 IMPLANT
CANISTER SUCT 3000ML PPV (MISCELLANEOUS) ×8 IMPLANT
CATH THORACIC 28FR (CATHETERS) IMPLANT
CATH THORACIC 28FR RT ANG (CATHETERS) IMPLANT
CATH THORACIC 36FR (CATHETERS) IMPLANT
CATH THORACIC 36FR RT ANG (CATHETERS) IMPLANT
CATH TROCAR 20FR (CATHETERS) IMPLANT
CHLORAPREP W/TINT 26 (MISCELLANEOUS) ×4 IMPLANT
CLIP VESOCCLUDE MED 6/CT (CLIP) IMPLANT
CNTNR URN SCR LID CUP LEK RST (MISCELLANEOUS) ×4 IMPLANT
CONN ST 1/4X3/8  BEN (MISCELLANEOUS) ×4
CONN ST 1/4X3/8 BEN (MISCELLANEOUS) IMPLANT
CONT SPEC 4OZ STRL OR WHT (MISCELLANEOUS) ×20
COVER SURGICAL LIGHT HANDLE (MISCELLANEOUS) IMPLANT
DEFOGGER SCOPE WARMER CLEARIFY (MISCELLANEOUS) ×4 IMPLANT
DERMABOND ADVANCED (GAUZE/BANDAGES/DRESSINGS) ×2
DERMABOND ADVANCED .7 DNX12 (GAUZE/BANDAGES/DRESSINGS) ×2 IMPLANT
DRAIN CHANNEL 28F RND 3/8 FF (WOUND CARE) IMPLANT
DRAIN CHANNEL 32F RND 10.7 FF (WOUND CARE) IMPLANT
DRAPE ARM DVNC X/XI (DISPOSABLE) ×8 IMPLANT
DRAPE COLUMN DVNC XI (DISPOSABLE) ×2 IMPLANT
DRAPE CV SPLIT W-CLR ANES SCRN (DRAPES) ×4 IMPLANT
DRAPE DA VINCI XI ARM (DISPOSABLE) ×16
DRAPE DA VINCI XI COLUMN (DISPOSABLE) ×4
DRAPE ORTHO SPLIT 77X108 STRL (DRAPES) ×4
DRAPE SURG ORHT 6 SPLT 77X108 (DRAPES) ×2 IMPLANT
DRAPE WARM FLUID 44X44 (DRAPES) ×4 IMPLANT
ELECT BLADE 6.5 EXT (BLADE) IMPLANT
ELECT REM PT RETURN 9FT ADLT (ELECTROSURGICAL) ×4
ELECTRODE REM PT RTRN 9FT ADLT (ELECTROSURGICAL) ×2 IMPLANT
FELT TEFLON 1X6 (MISCELLANEOUS) ×6 IMPLANT
GAUZE KITTNER 4X5 RF (MISCELLANEOUS) ×2 IMPLANT
GAUZE SPONGE 4X4 12PLY STRL (GAUZE/BANDAGES/DRESSINGS) ×2 IMPLANT
GLOVE BIO SURGEON STRL SZ 6.5 (GLOVE) ×2 IMPLANT
GLOVE BIO SURGEON STRL SZ7.5 (GLOVE) ×8 IMPLANT
GLOVE BIO SURGEONS STRL SZ 6.5 (GLOVE) ×1
GOWN STRL REUS W/ TWL LRG LVL3 (GOWN DISPOSABLE) ×5 IMPLANT
GOWN STRL REUS W/ TWL XL LVL3 (GOWN DISPOSABLE) ×6 IMPLANT
GOWN STRL REUS W/TWL 2XL LVL3 (GOWN DISPOSABLE) ×4 IMPLANT
GOWN STRL REUS W/TWL LRG LVL3 (GOWN DISPOSABLE) ×12
GOWN STRL REUS W/TWL XL LVL3 (GOWN DISPOSABLE) ×8
HEMOSTAT SURGICEL 2X14 (HEMOSTASIS) ×12 IMPLANT
IRRIGATION STRYKERFLOW (MISCELLANEOUS) ×2 IMPLANT
IRRIGATOR STRYKERFLOW (MISCELLANEOUS) ×4
KIT BASIN OR (CUSTOM PROCEDURE TRAY) ×4 IMPLANT
KIT SUCTION CATH 14FR (SUCTIONS) IMPLANT
KIT TURNOVER KIT B (KITS) ×4 IMPLANT
LOOP VESSEL SUPERMAXI WHITE (MISCELLANEOUS) IMPLANT
NDL HYPO 25GX1X1/2 BEV (NEEDLE) ×2 IMPLANT
NEEDLE 22X1 1/2 (OR ONLY) (NEEDLE) ×4 IMPLANT
NEEDLE HYPO 25GX1X1/2 BEV (NEEDLE) IMPLANT
NS IRRIG 1000ML POUR BTL (IV SOLUTION) ×9 IMPLANT
OBTURATOR OPTICAL STANDARD 8MM (TROCAR) ×4
OBTURATOR OPTICAL STND 8 DVNC (TROCAR) ×2
OBTURATOR OPTICALSTD 8 DVNC (TROCAR) ×1 IMPLANT
PACK CHEST (CUSTOM PROCEDURE TRAY) ×4 IMPLANT
PAD ARMBOARD 7.5X6 YLW CONV (MISCELLANEOUS) ×20 IMPLANT
SEAL CANN UNIV 5-8 DVNC XI (MISCELLANEOUS) ×6 IMPLANT
SEAL XI 5MM-8MM UNIVERSAL (MISCELLANEOUS) ×16
SEALANT PROGEL (MISCELLANEOUS) IMPLANT
SEALANT SURG COSEAL 4ML (VASCULAR PRODUCTS) IMPLANT
SEALANT SURG COSEAL 8ML (VASCULAR PRODUCTS) IMPLANT
SET TUBE SMOKE EVAC HIGH FLOW (TUBING) ×4 IMPLANT
SOLUTION ELECTROLUBE (MISCELLANEOUS) IMPLANT
SPONGE INTESTINAL PEANUT (DISPOSABLE) IMPLANT
STAPLER CANNULA SEAL DVNC XI (STAPLE) ×4 IMPLANT
STAPLER CANNULA SEAL XI (STAPLE) ×8
STOPCOCK 4 WAY LG BORE MALE ST (IV SETS) ×4 IMPLANT
SUT MON AB 2-0 CT1 36 (SUTURE) IMPLANT
SUT PDS AB 1 CTX 36 (SUTURE) IMPLANT
SUT PROLENE 4 0 RB 1 (SUTURE)
SUT PROLENE 4-0 RB1 .5 CRCL 36 (SUTURE) IMPLANT
SUT SILK  1 MH (SUTURE) ×4
SUT SILK 1 MH (SUTURE) ×2 IMPLANT
SUT SILK 1 TIES 10X30 (SUTURE) IMPLANT
SUT SILK 2 0 SH (SUTURE) ×2 IMPLANT
SUT SILK 2 0SH CR/8 30 (SUTURE) IMPLANT
SUT VIC AB 1 CTX 36 (SUTURE)
SUT VIC AB 1 CTX36XBRD ANBCTR (SUTURE) IMPLANT
SUT VIC AB 2-0 CT1 27 (SUTURE) ×4
SUT VIC AB 2-0 CT1 TAPERPNT 27 (SUTURE) ×2 IMPLANT
SUT VIC AB 3-0 SH 27 (SUTURE)
SUT VIC AB 3-0 SH 27X BRD (SUTURE) ×3 IMPLANT
SUT VIC AB 3-0 X1 27 (SUTURE) ×6 IMPLANT
SUT VICRYL 0 TIES 12 18 (SUTURE) ×4 IMPLANT
SUT VICRYL 0 UR6 27IN ABS (SUTURE) ×8 IMPLANT
SUT VICRYL 2 TP 1 (SUTURE) IMPLANT
SYR 10ML LL (SYRINGE) ×4 IMPLANT
SYR 20ML LL LF (SYRINGE) ×4 IMPLANT
SYR 50ML LL SCALE MARK (SYRINGE) ×4 IMPLANT
SYSTEM SAHARA CHEST DRAIN ATS (WOUND CARE) ×4 IMPLANT
TAPE CLOTH 4X10 WHT NS (GAUZE/BANDAGES/DRESSINGS) ×4 IMPLANT
TAPE CLOTH SURG 4X10 WHT LF (GAUZE/BANDAGES/DRESSINGS) ×3 IMPLANT
TIP APPLICATOR SPRAY EXTEND 16 (VASCULAR PRODUCTS) IMPLANT
TOWEL GREEN STERILE (TOWEL DISPOSABLE) ×4 IMPLANT
TRAY FOLEY MTR SLVR 16FR STAT (SET/KITS/TRAYS/PACK) ×4 IMPLANT
TROCAR BLADELESS 15MM (ENDOMECHANICALS) IMPLANT
TROCAR XCEL 12X100 BLDLESS (ENDOMECHANICALS) ×4 IMPLANT
TUBING EXTENTION W/L.L. (IV SETS) ×4 IMPLANT
WATER STERILE IRR 1000ML POUR (IV SOLUTION) ×4 IMPLANT

## 2020-05-06 NOTE — Op Note (Addendum)
      [address]       [place] [address]             [phone]        05/06/2020  Patient:  [name] Pre-Op Dx: Right hemidiaphragm paralysis   Status post CABG Post-op Dx: Same Procedure: - Right robotic assisted thoracoscopy - Plication of the right hemidiaphragm - Intercostal nerve block  Surgeon and Role:      * Lajuana Matte, MD - Primary    *Dr. Roxan Hockey, MD- assisting  Anesthesia  general EBL: Minimal  Blood Administration: None Specimen: None   Counts: correct   Indications: This is a 69 year old gentleman that underwent a coronary artery bypass grafting with harvesting of the right internal thoracic artery in September 2020.  Following the procedure he was noted to have an elevated right hemidiaphragm that did not regain function after close observation for over 6 months.  His sniff test was positive for diaphragmatic paralysis.  He was symptomatic with exertional dyspnea thus we elected to proceed with plication of the right hemidiaphragm.  Findings: Elevated right hemidiaphragm with significant redundancy.  At the completion of the case there was good reduction of the diaphragm.  Operative Technique: After the risks, benefits and alternatives were thoroughly discussed, the patient was brought to the operative theatre.  Anesthesia was induced, and the patient was placed in a left lateral decubitus position.  He was then prepped and draped in normal sterile fashion.  An appropriate surgical pause was performed, and pre-operative antibiotics were dosed accordingly.  We began with a 1 cm incision 1 fingerbreadth below the tip of the scapula and introduced our camera port.  The diaphragm was severely elevated but after introducing insufflation we had more working room to place our additional ports.  We then placed 3 other 8 mm robotic trochars to triangulate the diaphragm.  A 12 mm assistant port was then placed in the anterior  axillary line.  The robot was then docked to the patient and we targeted on the diaphragm.  We checked for areas of laxity along the diaphragm and placed in interrupted Ethibond suture in mattress fashion to imbricate the center of the diaphragm.  This created a good leading edge to ensure that there was no injury to the visceral organs.  We then began to plicate the diaphragm between 2 felt strips starting laterally and moving medially.  Once the first row of plication was completed we again checked for laxity and there was another area that was more lateral that required further plication.  This was also done between a felt strips.  The redundancy was eliminated and the diaphragm was much lower in the thoracic cavity.  At this point, the robotic instruments were removed and the robot was undocked.  An intercostal nerve block was then performed and a 28 [place] Blake drain was then passed through the anteriormost port along the diaphragm.  We visualized the lung reexpand.  The skin and soft tissue were then closed with absorbable suture.  The patient tolerated the procedure well without any complications and was transferred to the PACU in stable condition   Maxi Carreras O Keshawna Dix

## 2020-05-06 NOTE — Brief Op Note (Signed)
05/06/2020  11:28 AM  PATIENT:  Craig York  69 y.o. male  PRE-OPERATIVE DIAGNOSIS:  RIGHT DIAPHRAGM PARALYSIS  POST-OPERATIVE DIAGNOSIS:  RIGHT DIAPHRAGM PARALYSIS  PROCEDURE:  XI ROBOTIC ASSISTED THORASCOPY, RIGHT DIAPHRAGM PLICATION, and RIGHT INTERCOSTAL NERVE BLOCK   SURGEON:  Surgeon(s) and Role:    Lightfoot, Lucile Crater, MD - Primary    Melrose Nakayama, MD - Assisting  PHYSICIAN ASSISTANT: Lars Pinks PA-C  ANESTHESIA:   general  EBL:  50 mL   BLOOD ADMINISTERED:none  DRAINS: 51 Blake drain placed in the right pleural space   LOCAL MEDICATIONS USED:  OTHER Exparel  COUNTS CORRECT:  YES  DICTATION: .Dragon Dictation  PLAN OF CARE: Admit to inpatient   PATIENT DISPOSITION:  PACU - hemodynamically stable.   Delay start of Pharmacological VTE agent (>24hrs) due to surgical blood loss or risk of bleeding: yes

## 2020-05-06 NOTE — Discharge Summary (Signed)
Physician Discharge Summary       [address]       [place] [address]             [phone]    Patient ID: Craig York MRN: [id number] DOB/AGE: 1951-03-06 69 y.o.  Admit date: 05/06/2020 Discharge date: 05/08/2020  Admission Diagnoses: Right hemidiaphragm paralysis  Discharge Diagnoses:  1. S/p robotic assisted right VATS, plication of the right hemidiaphragm, intercostal nerve block 2. History of CAD-s/p CABG x 4 09/20 3. History of hyperlipidemia 4. History of hypertension 5. History of IDA 6. History of Hemorrhage of gastrointestinal tract 7. History of Diabetes mellitus without complication ([place]) 8. History of BPH (benign prostatic hyperplasia)  Consults: None  Procedure (s):  Right robotic assisted thoracoscopy - Plication of the right hemidiaphragm - Intercostal nerve block by Dr. Kipp Brood on 05/06/2020  History of Presenting Illness: Craig York y.o.males/p CABG with BIMA on Sept 15th developed a post-op elevated right hemidiaphragm. His sniff test was positive for a paralyzed diaphragm. He denies any shortness of breath with exertion or when laying flat. He was able to walk 1 mile after being discharged from the hospital without difficulty.  Dr. Kipp Brood discussed the need for robotic assisted right VATS, plication of right hemidiaphragm, and intercostal nerve block. Potential risks, benefits, and complications of the surgery were discussed with the patient and he agreed to proceed with surgery. He [facility] to [facility]06[facility] for the aforementioned surgery.   Brief Hospital Course:  Patient remained afebrile and hemodynamically stable. Chest tube was to suction and there was no air leak. Daily chest x rays were obtained and remained stable. Chest tube was placed to water seal on 06/11. Chest tube was removed on 06/12. Follow up chest x ray showed stable appearance without pneumothorax. He is ambulating on room air.  He has been tolerating a diet. He has a history of diabetes and Metformin was started on 06/12. He will be restarted on Jardiance at discharge. His wounds are clean, dry, and continuing to heal. He is felt surgically stable for discharge.   Latest Vital Signs: Blood pressure 139/86, pulse 70, temperature 97.7 F (36.5 C), temperature source Oral, resp. rate 19, height 6\' 1"  (1.854 m), weight 81.2 kg, SpO2 100 %. General appearance: alert, cooperative and no distress Heart: regular rate and rhythm Lungs: clear to auscultation bilaterally Abdomen: benign Extremities: no edema Wound: incis healing well   Discharge Condition: Stable and discharged to home.  Recent laboratory studies:  Lab Results  Component Value Date   WBC 9.5 05/08/2020   HGB 9.6 (L) 05/08/2020   HCT 31.7 (L) 05/08/2020   MCV 77.7 (L) 05/08/2020   PLT 234 05/08/2020   Lab Results  Component Value Date   NA 139 05/08/2020   K 3.8 05/08/2020   CL 106 05/08/2020   CO2 23 05/08/2020   CREATININE 0.85 05/08/2020   GLUCOSE 110 (H) 05/08/2020    Diagnostic Studies: DG Chest 2 View  Result Date: 05/05/2020 CLINICAL DATA:  Preoperative evaluation, paralyzed RIGHT diaphragm, hypertension, diabetes mellitus, coronary artery disease EXAM: CHEST - 2 VIEW COMPARISON:  03/25/2020 FINDINGS: Normal heart size post CABG and coronary stenting. Mediastinal contours and pulmonary vascularity normal. Chronic elevation of RIGHT diaphragm with bowel interposition. Subsegmental atelectasis RIGHT base. Lungs otherwise clear. No pleural effusion or pneumothorax. Mild osseous demineralization. IMPRESSION: Chronic elevation of RIGHT diaphragm with mild RIGHT basilar atelectasis. Post CABG and coronary stenting. Electronically Signed   By:  Lavonia Dana M.D.   On: 05/05/2020 08:17   DG CHEST PORT 1 VIEW  Result Date: 05/07/2020 CLINICAL DATA:  Follow-up pneumothorax. EXAM: PORTABLE CHEST 1 VIEW COMPARISON:  05/06/2020 FINDINGS: Right chest  tube overlies the right hemidiaphragm. No pneumothorax. Previous median sternotomy and CABG procedure. Decreased lung volumes. No pulmonary edema or pleural effusion. No airspace opacities. IMPRESSION: Right chest tube in place without pneumothorax. Electronically Signed   By: Kerby Moors M.D.   On: 05/07/2020 09:31   DG Chest Port 1 View  Result Date: 05/06/2020 CLINICAL DATA:  Status post right hemidiaphragm plication. EXAM: PORTABLE CHEST 1 VIEW COMPARISON:  Chest x-ray dated May 04, 2020. FINDINGS: New right-sided chest tube. No pneumothorax. Stable cardiomediastinal silhouette status post CABG. Normal pulmonary vascularity. No consolidation or pleural effusion. Symmetric hemidiaphragms. No acute osseous abnormality. IMPRESSION: 1. New right-sided chest tube. No pneumothorax. Electronically Signed   By: Titus Dubin M.D.   On: 05/06/2020 14:59   Discharge Instructions    Discharge patient   Complete by: As directed    Discharge disposition: 01-Home or Self Care   Discharge patient date: 05/08/2020     Discharge Medications: Allergies as of 05/08/2020      Reactions   Simvastatin    Elevated CK   Penicillins Rash   Did it involve swelling of the face/tongue/throat, SOB, or low BP? No Did it involve sudden or severe rash/hives, skin peeling, or any reaction on the inside of your mouth or nose? No Did you need to seek medical attention at a hospital or doctor's office? No When did it last happen? If all above answers are "NO", may proceed with cephalosporin use.      Medication List    STOP taking these medications   oxyCODONE-acetaminophen 10-325 MG tablet Commonly known as: PERCOCET     TAKE these medications   acetaminophen 500 MG tablet Commonly known as: TYLENOL Take 1,000 mg by mouth every 6 (six) hours as needed for moderate pain.   amLODipine 10 MG tablet Commonly known as: NORVASC Take 10 mg by mouth daily.   aspirin EC 81 MG tablet Take 81 mg by mouth  daily.   clopidogrel 75 MG tablet Commonly known as: PLAVIX Take 1 tablet (75 mg total) by mouth daily.   empagliflozin 25 MG Tabs tablet Commonly known as: Jardiance TAKE 1 TABLET BY MOUTH ONCE DAILY BEFORE BREAKFAST   ferrous sulfate 325 (65 FE) MG tablet Take 1 tablet (325 mg total) by mouth 2 (two) times daily with a meal.   metFORMIN 500 MG tablet Commonly known as: GLUCOPHAGE TAKE TWO TABLETS BY MOUTH TWICE DAILY WITH A MEAL What changed:   how much to take  how to take this  when to take this  additional instructions   metoprolol succinate 25 MG 24 hr tablet Commonly known as: TOPROL-XL Take 1 tablet (25 mg total) by mouth daily.   pantoprazole 40 MG tablet Commonly known as: PROTONIX Take 1 tablet (40 mg total) by mouth daily.   rosuvastatin 20 MG tablet Commonly known as: CRESTOR Take 1 tablet (20 mg total) by mouth daily.   sildenafil 100 MG tablet Commonly known as: Viagra Take 0.5-1 tablets (50-100 mg total) by mouth daily as needed for erectile dysfunction.   Systane Complete 0.6 % Soln Generic drug: Propylene Glycol Place 1 drop into both eyes 3 (three) times daily as needed (dry/irritated eyes.).   tadalafil 20 MG tablet Commonly known as: Cialis Take 1 tablet (20  mg total) by mouth daily as needed for erectile dysfunction.   traMADol 50 MG tablet Commonly known as: ULTRAM Take 1 tablet (50 mg total) by mouth every 6 (six) hours as needed for up to 5 days for moderate pain (mild pain).       Follow Up Appointments:  Follow-up Information    Lajuana Matte, MD. Go on 05/20/2020.   Specialty: Cardiothoracic Surgery Why: Appointment time is at 11:45 am Contact information: [address] [address] [phone]               Signed: Gaspar Bidding 05/08/2020, 11:34 AM

## 2020-05-06 NOTE — Anesthesia Procedure Notes (Addendum)
Procedure Name: Intubation Date/Time: 05/06/2020 8:12 AM Performed by: Kathryne Hitch, CRNA Pre-anesthesia Checklist: Patient identified, Emergency Drugs available, Suction available and Patient being monitored Patient Re-evaluated:Patient Re-evaluated prior to induction Oxygen Delivery Method: Circle system utilized Preoxygenation: Pre-oxygenation with 100% oxygen Induction Type: IV induction Ventilation: Mask ventilation without difficulty and Oral airway inserted - appropriate to patient size Laryngoscope Size: Mac and 4 Grade View: Grade I Tube type: Oral Endobronchial tube: Left and Double lumen EBT Number of attempts: 1 Airway Equipment and Method: Stylet and Oral airway Placement Confirmation: ETT inserted through vocal cords under direct vision,  positive ETCO2 and breath sounds checked- equal and bilateral Tube secured with: Tape Dental Injury: Teeth and Oropharynx as per pre-operative assessment  Comments: Marzetta Merino intubated patient. VSS, +ETC02, =BBS

## 2020-05-06 NOTE — Anesthesia Postprocedure Evaluation (Signed)
Anesthesia Post Note  Patient: Craig York  Procedure(s) Performed: XI ROBOTIC ASSISTED THORASCOPY-DIAPHRAGM PLICATION (Right ) INTERCOSTAL NERVE BLOCK (Right Chest)     Patient location during evaluation: PACU Anesthesia Type: General Level of consciousness: awake and alert Pain management: pain level controlled Vital Signs Assessment: post-procedure vital signs reviewed and stable Respiratory status: spontaneous breathing, nonlabored ventilation, respiratory function stable and patient connected to nasal cannula oxygen Cardiovascular status: blood pressure returned to baseline and stable Postop Assessment: no apparent nausea or vomiting Anesthetic complications: no   No complications documented.  Last Vitals:  Vitals:   05/06/20 1420 05/06/20 1452  BP: (!) 158/85 (!) 157/91  Pulse: 85 92  Resp: 16 18  Temp: 36.6 C 36.6 C  SpO2: 100% 99%    Last Pain:  Vitals:   05/06/20 1452  TempSrc: Oral  PainSc: 2                  Effie Berkshire

## 2020-05-06 NOTE — Anesthesia Procedure Notes (Signed)
Arterial Line Insertion Start/End6/08/2020 6:54 AM, 05/06/2020 6:54 AM Performed by: Kathryne Hitch, CRNA, CRNA  Patient location: Pre-op. Preanesthetic checklist: patient identified, IV checked, site marked, risks and benefits discussed, surgical consent, monitors and equipment checked, pre-op evaluation and timeout performed Lidocaine 1% used for infiltration Right, radial was placed Catheter size: 20 Fr Hand hygiene performed  and maximum sterile barriers used   Attempts: 1 Procedure performed without using ultrasound guided technique. Following insertion, dressing applied and Biopatch. Post procedure assessment: normal and unchanged  Patient tolerated the procedure well with no immediate complications. Additional procedure comments: Marzetta Merino placed Aline. [name]

## 2020-05-06 NOTE — Transfer of Care (Signed)
Immediate Anesthesia Transfer of Care Note  Patient: Craig York  Procedure(s) Performed: XI ROBOTIC ASSISTED THORASCOPY-DIAPHRAGM PLICATION (Right ) INTERCOSTAL NERVE BLOCK (Right Chest)  Patient Location: PACU  Anesthesia Type:General  Level of Consciousness: drowsy and patient cooperative  Airway & Oxygen Therapy: Patient Spontanous Breathing and Patient connected to face mask oxygen  Post-op Assessment: Report given to RN and Post -op Vital signs reviewed and stable  Post vital signs: Reviewed and stable  Last Vitals:  Vitals Value Taken Time  BP 161/81 05/06/20 1149  Temp    Pulse 64 05/06/20 1151  Resp 17 05/06/20 1151  SpO2 100 % 05/06/20 1151  Vitals shown include unvalidated device data.  Last Pain:  Vitals:   05/06/20 0641  PainSc: 0-No pain      Patients Stated Pain Goal: 3 (83/15/17 6160)  Complications: No complications documented.

## 2020-05-06 NOTE — Plan of Care (Signed)
  Problem: Education: Goal: Knowledge of disease or condition will improve Outcome: Progressing Goal: Knowledge of the prescribed therapeutic regimen will improve Outcome: Progressing   Problem: Activity: Goal: Risk for activity intolerance will decrease Outcome: Progressing   Problem: Cardiac: Goal: Will achieve and/or maintain hemodynamic stability Outcome: Progressing   Problem: Respiratory: Goal: Respiratory status will improve Outcome: Progressing   Problem: Pain Management: Goal: Pain level will decrease Outcome: Progressing

## 2020-05-06 NOTE — Discharge Instructions (Signed)
Robot-Assisted Thoracic Surgery, Care After This sheet gives you information about how to care for yourself after your procedure. Your health care provider may also give you more specific instructions. If you have problems or questions, contact your health care provider. What can I expect after the procedure? After the procedure, it is common to have:  Some pain and aches in the area of your surgical cuts (incisions).  Pain when breathing in (inhaling) and coughing.  Tiredness (fatigue).  Trouble sleeping.  Constipation. Follow these instructions at home: Medicines  Take over-the-counter and prescription medicines only as told by your health care provider.  If you were prescribed an antibiotic medicine, take it as told by your health care provider. Do not stop taking the antibiotic even if you start to feel better.  Talk with your health care provider about safe and effective ways to manage pain after your procedure. Pain management should fit your specific health needs.  Take prescription pain medicine before pain becomes severe. Relieving and controlling your pain will make breathing easier for you. Activity  Return to your normal activities as told by your health care provider. Ask your health care provider what activities are safe for you.  Do not lift anything that is heavier than 10 lb (4.5 kg), or the limit that you are told, until your health care provider says that it is safe.  Avoid sitting for a long time without moving. Get up and move around one or more times every few hours. Bathing  Do not take baths, swim, or use a hot tub until your health care provider approves. You may take showers. Incision care  Follow instructions from your health care provider about how to take care of your incision(s). Make sure you: ? Wash your hands with soap and water before you change your bandage (dressing). If soap and water are not available, use hand sanitizer. ? Change your  dressing as told by your health care provider. ? Leave stitches (sutures), skin glue, or adhesive strips in place. These skin closures may need to stay in place for 2 weeks or longer. If adhesive strip edges start to loosen and curl up, you may trim the loose edges. Do not remove adhesive strips completely unless your health care provider tells you to do that.  Check your incision area every day for signs of infection. Check for: ? Redness, swelling, or pain. ? Fluid or blood. ? Warmth. ? Pus or a bad smell. Driving  Ask your health care provider when it is safe for you to drive.  Do not drive or use heavy machinery while taking prescription pain medicine. Eating and drinking  Follow instructions from your health care provider about eating or drinking restrictions. These will vary depending on what procedure you had. Your health care provider may recommend: ? A liquid diet or soft diet for the first few days. ? Meals that are smaller and more frequent. ? A diet of fruits, vegetables, whole grains, and low-fat proteins. ? Limiting foods that are high in processed sugar and fat, including fried and sweet foods. Pneumonia prevention   Do not use any products that contain nicotine or tobacco, such as cigarettes and e-cigarettes. If you need help quitting, ask your health care provider.  Avoid secondhand smoke.  Do deep breathing exercises and cough regularly as directed. This helps to clear mucus and prevent pneumonia. If it hurts to cough, try one of these methods to ease your pain when you cough: ? Hold a   pillow against your chest. ? Place the palms of both hands over your incisions (use splinting).  Use an incentive spirometer as directed. This device measures how much air your lungs are getting with each breath. Using this will improve your breathing.  Do pulmonary rehabilitation as directed. This is a program that includes exercise, education, and support. General  instructions  Wear compression stockings as told by your health care provider. These stockings help to prevent blood clots and reduce swelling in your legs.  If you have a drainage tube: ? Follow instructions from your health care provider about how to take care of it. ? Do not travel by airplane after your tube is removed until your health care provider tells you it is safe.  To prevent or treat constipation while you are taking prescription pain medicine, your health care provider may recommend that you: ? Drink enough fluid to keep your urine pale yellow. ? Take over-the-counter or prescription medicines. ? Eat foods that are high in fiber, such as fresh fruits and vegetables, whole grains, and beans. ? Limit foods that are high in fat and processed sugars, such as fried and sweet foods.  Keep all follow-up visits as told by your health care provider. This is important. Contact a health care provider if:  You have redness, swelling, or pain around an incision.  You have fluid or blood coming from an incision.  An incision feels warm to the touch.  You have pus or a bad smell coming from an incision.  You have a fever.  You cannot eat or drink without vomiting.  Your prescription pain medicine is not controlling your pain. Get help right away if:  You have chest pain.  Your heart is beating quickly.  You have trouble breathing.  You have trouble speaking.  You are confused.  You feel weak or dizzy, or you faint. These symptoms may represent a serious problem that is an emergency. Do not wait to see if the symptoms will go away. Get medical help right away. Call your local emergency services (911 in the [place] Do not drive yourself to the hospital. Summary  Talk with your health care provider about safe and effective ways to manage pain after your procedure. Pain management should fit your specific health needs.  Return to your normal activities as told by your health  care provider. Ask your health care provider what activities are safe for you.  Do deep breathing exercises and cough regularly as directed. This helps to clear mucus and prevent pneumonia. If it hurts to cough, ease pain by holding a pillow against your chest or by placing the palms of both hands over your incisions (splinting). This information is not intended to replace advice given to you by your health care provider. Make sure you discuss any questions you have with your health care provider. Document Revised: 09/12/2019 Document Reviewed: 03/19/2017 Elsevier Patient Education  2020 [employer] Inc.  

## 2020-05-06 NOTE — H&P (Signed)
[address]       ,[place] [address]             [phone]       This is a 69 yo male who is 9 months out from a CABG, who has a known right hemidiaphragm paralysis.  He is quite symptomatic from this, and presents for surgical repair.  He was previously scheduled, but this was cancelled due to severe anemia from a GI source.  He has since recovered.  On his pre-operative evaluation, he was noted to have new T wave abnormalities, but denies any chest pain, or exertional dyspnea.     He is scheduled for a right RATS, diaphragm plication  Per my original clinic note   [place] Record #[id number] Date of Birth: 1951/03/11  Referring: [name], MD Primary Care: [name], MD Primary Cardiologist: No primary care provider on file.  Chief Complaint:        Chief Complaint  Patient presents with  . Routine Post Op    s/p CABG X 4..08/12/19.Marland KitchenMarland KitchenELEVATED RIGHT HEMIDIAPHRAGM with SNIFF TEST 09/10/19    History of Present Illness:    Craig York 69 y.o. male s/p CABG with BIMA on Sept 15th developed a post-op elevated right hemidiaphragm.  His sniff test was positive for a paralyzed diaphragm.  He denies any shortness of breath with exertion or when laying flat.  He was able to walk 1 mile after being discharged from the hospital without difficulty.         Zubrod Score: At the time of surgery this patient's most appropriate activity status/level should be described as: [x] ?    0    Normal activity, no symptoms [] ?    1    Restricted in physical strenuous activity but ambulatory, able to do out light work [] ?    2    Ambulatory and capable of self care, unable to do work activities, up and about               >50 % of waking hours                              [] ?    3    Only limited self care, in bed greater than 50% of waking hours [] ?    4    Completely disabled, no self care, confined to bed or  chair [] ?    5    Moribund       Past Medical History:  Diagnosis Date  . Anginal pain ([place])   . Benign neoplasm of descending colon   . BPH (benign prostatic hyperplasia)   . Coronary artery disease   . Diabetes mellitus without complication ([place])   . Hemorrhage of gastrointestinal tract 07/23/2008  . Hyperlipidemia   . Hypertension   . Iron deficiency anemia   . Myocardial infarction ([place])   . Polyp of sigmoid colon          Past Surgical History:  Procedure Laterality Date  . CARDIAC CATHETERIZATION    . chest surgery for fungal infection    . COLONOSCOPY WITH PROPOFOL N/A 07/10/2017   Procedure: COLONOSCOPY WITH PROPOFOL;  Surgeon: [name], MD;  Location: [facility] ENDOSCOPY;  Service: Endoscopy;  Laterality: N/A;  . CORONARY ARTERY BYPASS GRAFT N/A 08/12/2019   Procedure: CORONARY ARTERY BYPASS GRAFTING (CABG)X 4;  Surgeon: [name], MD;  Location: [facility];  Service: Open Heart Surgery;  Laterality: N/A;  CABG x  4  using bilateral internal mammary arteries and endoscopically harvested left saphenous vein  . ESOPHAGOGASTRODUODENOSCOPY (EGD) WITH PROPOFOL N/A 07/10/2017   Procedure: ESOPHAGOGASTRODUODENOSCOPY (EGD) WITH PROPOFOL;  Surgeon: [name], MD;  Location[facility] ENDOSCOPY;  Service: Endoscopy;  Laterality: N/A;  . HERNIA REPAIR    . LEFT HEART CATH AND CORONARY ANGIOGRAPHY Left 07/21/2019   Procedure: LEFT HEART CATH AND CORONARY ANGIOGRAPHY;  Surgeon: [name], MD;  Location: [facility] CV LAB;  Service: Cardiovascular;  Laterality: Left;  . TEE WITHOUT CARDIOVERSION N/A 08/12/2019   Procedure: TRANSESOPHAGEAL ECHOCARDIOGRAM (TEE);  Surgeon: [name], MD;  Location: [place] [facility];  Service: Open Heart Surgery;  Laterality: N/A;         Family History  Problem Relation Age of Onset  . Hypertension Mother      Social History       Tobacco Use  Smoking Status Former Smoker  Smokeless Tobacco Former Systems developer  .  Types: Chew  . Quit date: 11/27/1968  Tobacco Comment   used 2 packs per week; quit over 40 years ago    Social History       Substance and Sexual Activity  Alcohol Use Not Currently  . Alcohol/week: 0.0 standard drinks          Allergies  Allergen Reactions  . Simvastatin     Elevated CK  . Penicillins Rash    Did it involve swelling of the face/tongue/throat, SOB, or low BP? No Did it involve sudden or severe rash/hives, skin peeling, or any reaction on the inside of your mouth or nose? No Did you need to seek medical attention at a hospital or doctor's office? No When did it last happen? If all above answers are "NO", may proceed with cephalosporin use.           Current Outpatient Medications  Medication Sig Dispense Refill  . acetaminophen (TYLENOL) 500 MG tablet Take 1,000 mg by mouth every 6 (six) hours as needed for moderate pain.    [name] aspirin 81 MG tablet Take 81 mg by mouth daily.     . clopidogrel (PLAVIX) 75 MG tablet Take 1 tablet (75 mg total) by mouth daily. 30 tablet 1  . isosorbide dinitrate (ISORDIL) 10 MG tablet Take 1 tablet (10 mg total) by mouth 3 (three) times daily. 90 tablet 1  . lisinopril (ZESTRIL) 10 MG tablet Take 1 tablet (10 mg total) by mouth daily. 30 tablet 1  . metFORMIN (GLUCOPHAGE) 500 MG tablet TAKE TWO TABLETS BY MOUTH TWICE DAILY WITH A MEAL 360 tablet 4  . metoprolol tartrate (LOPRESSOR) 25 MG tablet Take 1 tablet (25 mg total) by mouth every 8 (eight) hours. 90 tablet 1  . oxyCODONE-acetaminophen (PERCOCET) 10-325 MG tablet Take 1 tablet by mouth every 8 (eight) hours as needed for pain. 30 tablet 0  . rosuvastatin (CRESTOR) 20 MG tablet Take 1 tablet (20 mg total) by mouth daily at 6 PM. 30 tablet 1  . sildenafil (VIAGRA) 100 MG tablet Take 0.5-1 tablets (50-100 mg total) by mouth daily as needed for erectile dysfunction. 5 tablet 11  . amiodarone (PACERONE) 400 MG tablet Take 0.5 tablets (200 mg total) by mouth  2 (two) times daily. (Patient not taking: Reported on 09/22/2019) 60 tablet 1   No current facility-administered medications for this visit.     Review of Systems  Constitutional: Negative.   Respiratory: Negative for cough and shortness of breath.   Cardiovascular: Negative for chest pain, orthopnea and leg swelling.  Gastrointestinal: Negative.   Musculoskeletal: Positive for joint pain and myalgias.  Neurological: Negative.      PHYSICAL EXAMINATION: BP 138/85 (BP Location: Right Arm, Patient Position: Sitting, Cuff Size: Normal)   Pulse 72   Temp (!) 96.6 F (35.9 C)   Resp 16   Ht 6\' 1"  (1.854 m)   Wt 187 lb (84.8 kg)   SpO2 99% Comment: RA  BMI 24.67 kg/m  Physical Exam  Constitutional: He is oriented to person, place, and time. He appears well-developed and well-nourished. No distress.  HENT:  Head: Normocephalic and atraumatic.  Eyes: Conjunctivae and EOM are normal. No scleral icterus.  Neck: Normal range of motion. No tracheal deviation present.  Cardiovascular: Normal rate and regular rhythm.  Murmur heard. Respiratory: Effort normal and breath sounds normal. No respiratory distress.  GI: Soft. He exhibits no distension.  Musculoskeletal: Normal range of motion.  Neurological: He is alert and oriented to person, place, and time.  Skin: Skin is warm and dry. He is not diaphoretic.    Diagnostic Studies & Laboratory data:     Recent Radiology Findings:    Imaging Results  Dg Sniff Test  Result Date: 09/10/2019 CLINICAL DATA:  Asymmetric elevation right hemidiaphragm. EXAM: CHEST FLUOROSCOPY TECHNIQUE: Real-time fluoroscopic evaluation of the chest was performed. FLUOROSCOPY TIME:  Fluoroscopy Time:  0 minutes and 54 seconds. Radiation Exposure Index (if provided by the fluoroscopic device): 81 mGy Number of Acquired Spot Images: COMPARISON:  Chest x-ray 08/22/2019 FINDINGS: With a sniff maneuver, decreased excursion of the right hemidiaphragm noted  and on some sniffs, paradoxical elevation of the right hemidiaphragm is observed. IMPRESSION: Fluoroscopic imaging features compatible with paralysis of the right hemidiaphragm. Electronically Signed   By: Misty Stanley M.D.   On: 09/10/2019 10:24        I have independently reviewed the above radiology studies  and reviewed the findings with the patient.   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  He did have a RIMA used during his surgery.    Heba Ige Bary Leriche

## 2020-05-06 NOTE — Plan of Care (Signed)

## 2020-05-07 ENCOUNTER — Inpatient Hospital Stay (HOSPITAL_COMMUNITY): Payer: Medicare Other

## 2020-05-07 ENCOUNTER — Encounter (HOSPITAL_COMMUNITY): Payer: Self-pay | Admitting: Thoracic Surgery (Cardiothoracic Vascular Surgery)

## 2020-05-07 LAB — GLUCOSE, CAPILLARY
Glucose-Capillary: 95 mg/dL (ref 70–99)
Glucose-Capillary: 102 mg/dL — ABNORMAL HIGH (ref 70–99)
Glucose-Capillary: 182 mg/dL — ABNORMAL HIGH (ref 70–99)

## 2020-05-07 LAB — CBC
HCT: 34.4 % — ABNORMAL LOW (ref 39.0–52.0)
Hemoglobin: 10.4 g/dL — ABNORMAL LOW (ref 13.0–17.0)
MCH: 23.3 pg — ABNORMAL LOW (ref 26.0–34.0)
MCHC: 30.2 g/dL (ref 30.0–36.0)
MCV: 77.1 fL — ABNORMAL LOW (ref 80.0–100.0)
Platelets: 303 10*3/uL (ref 150–400)
RBC: 4.46 MIL/uL (ref 4.22–5.81)
RDW: 27.2 % — ABNORMAL HIGH (ref 11.5–15.5)
WBC: 10.1 10*3/uL (ref 4.0–10.5)
nRBC: 0 % (ref 0.0–0.2)

## 2020-05-07 LAB — BASIC METABOLIC PANEL
Anion gap: 6 (ref 5–15)
BUN: 14 mg/dL (ref 8–23)
CO2: 25 mmol/L (ref 22–32)
Calcium: 9.4 mg/dL (ref 8.9–10.3)
Chloride: 106 mmol/L (ref 98–111)
Creatinine, Ser: 0.94 mg/dL (ref 0.61–1.24)
GFR calc Af Amer: >60 mL/min (ref >60)
GFR calc non Af Amer: >60 mL/min (ref >60)
Glucose, Bld: 121 mg/dL — ABNORMAL HIGH (ref 70–99)
Potassium: 3.7 mmol/L (ref 3.5–5.1)
Sodium: 137 mmol/L (ref 135–145)

## 2020-05-07 MED ORDER — METFORMIN HCL 500 MG PO TABS: 1000.0000 mg | ORAL_TABLET | Freq: Two times a day (BID) | ORAL | Status: DC

## 2020-05-07 MED ORDER — ONDANSETRON HCL 4 MG/2ML IJ SOLN
4.0000 mg | Freq: Four times a day (QID) | INTRAMUSCULAR | Status: DC
  Administered 2020-05-07: 4 mg via INTRAVENOUS
  Filled 2020-05-07: qty 2

## 2020-05-07 MED ORDER — METFORMIN HCL 500 MG PO TABS
1000.0000 mg | ORAL_TABLET | Freq: Two times a day (BID) | ORAL | Status: DC
  Administered 2020-05-08: 1000 mg via ORAL
  Filled 2020-05-07: qty 2

## 2020-05-07 MED ORDER — CLOPIDOGREL BISULFATE 75 MG PO TABS
75.0000 mg | ORAL_TABLET | Freq: Every day | ORAL | Status: DC
  Administered 2020-05-07 – 2020-05-08 (×2): 75 mg via ORAL
  Filled 2020-05-07 (×2): qty 1

## 2020-05-07 MED ORDER — CLOPIDOGREL BISULFATE 75 MG PO TABS: 75.0000 mg | ORAL_TABLET | Freq: Every day | ORAL | Status: DC

## 2020-05-07 MED ORDER — LACTULOSE 10 GM/15ML PO SOLN
20.0000 g | Freq: Once | ORAL | Status: AC
  Administered 2020-05-07: 20 g via ORAL
  Filled 2020-05-07: qty 30

## 2020-05-07 MED ORDER — WHITE PETROLATUM EX OINT
TOPICAL_OINTMENT | CUTANEOUS | Status: AC
  Filled 2020-05-07: qty 28.35

## 2020-05-07 MED ORDER — POTASSIUM CHLORIDE CRYS ER 20 MEQ PO TBCR
30.0000 meq | EXTENDED_RELEASE_TABLET | Freq: Once | ORAL | Status: AC
  Administered 2020-05-07: 30 meq via ORAL
  Filled 2020-05-07: qty 1

## 2020-05-07 NOTE — Plan of Care (Signed)

## 2020-05-07 NOTE — Progress Notes (Addendum)
      [address]       ,[place] [address]             [phone]       1 Day Post-Op Procedure(s) (LRB): XI ROBOTIC ASSISTED THORASCOPY-DIAPHRAGM PLICATION (Right) INTERCOSTAL NERVE BLOCK (Right)  Subjective: Patient passing flatus but no bowel movement yet;he is asking for laxative  Objective: Vital signs in last 24 hours: Temp:  [97.6 F (36.4 C)-98.9 F (37.2 C)] 98.4 F (36.9 C) (06/11 0307) Pulse Rate:  [60-93] 80 (06/11 0307) Cardiac Rhythm: Normal sinus rhythm (06/11 0717) Resp:  [13-20] 16 (06/11 0307) BP: (145-201)/(74-98) 148/80 (06/11 0307) SpO2:  [99 %-100 %] 99 % (06/11 0307) Arterial Line BP: (192-224)/(73-107) 213/85 (06/10 1233) Weight:  [81.2 kg] 81.2 kg (06/10 1452)      Intake/Output from previous day: 06/10 0701 - 06/11 0700 In: 2160 [P.O.:360; I.V.:1400; IV Piggyback:400] Out: 3710 [Urine:3360; Blood:50; Chest Tube:300]   Physical Exam:  Cardiovascular: RRR Pulmonary: Clear to auscultation bilaterally Abdomen: Soft, non tender, bowel sounds present, some swelling right side of abdomen (non tender) Extremities: No lower extremity edema. Wounds: Clean and dry.  No erythema or signs of infection. Chest Tube: to suction, no air leak  Lab Results: CBC: Recent Labs    05/04/20 1119 05/07/20 0314  WBC 4.7 10.1  HGB 9.8* 10.4*  HCT 32.5* 34.4*  PLT 268 303   BMET:  Recent Labs    05/07/20 0314  NA 137  K 3.7  CL 106  CO2 25  GLUCOSE 121*  BUN 14  CREATININE 0.94  CALCIUM 9.4    PT/INR:  Recent Labs    05/04/20 1119  LABPROT 13.2  INR 1.0   ABG:  INR: Will add last result for INR, ABG once components are confirmed Will add last 4 CBG results once components are confirmed  Assessment/Plan:  1. CV - SR. Amlodipine to be restarted this am and will also restart Plavix. 2.  Pulmonary - On 2 liters of oxygen via [place]. Chest tube with 300 cc of output since surgery. Chest tube is to suction and there is no  air leak. CXR this am appears stable. Hope to  place chest tube to water seal. Encourage incentive spirometer. 3. LOC for constipation  Donielle M ZimmermanPA-C 05/07/2020,7:30 AM [phone]  Agree with above Doing well.  Patient states that he notices an improvement in his respiratory status 300 of the chest tube We will transition to waterseal today Restarting Plavix today. If chest tube output is less than 300 tomorrow we will discharge.  Allannah Kempen Bary Leriche

## 2020-05-07 NOTE — Progress Notes (Signed)
Patient with history of diabetes. Will restart Metformin 06/12 and restart Jardiance at discharge. Of note, last HGA1C qas 7.

## 2020-05-07 NOTE — Progress Notes (Signed)
Pt's care started from 11 am, got report from April RN  Sequoyah Counterman, RN 

## 2020-05-07 NOTE — Plan of Care (Signed)
  Problem: Education: Goal: Knowledge of disease or condition will improve Outcome: Progressing Goal: Knowledge of the prescribed therapeutic regimen will improve Outcome: Progressing   Problem: Activity: Goal: Risk for activity intolerance will decrease Outcome: Progressing   Problem: Cardiac: Goal: Will achieve and/or maintain hemodynamic stability Outcome: Progressing   Problem: Clinical Measurements: Goal: Postoperative complications will be avoided or minimized Outcome: Progressing   Problem: Respiratory: Goal: Respiratory status will improve Outcome: Progressing   Problem: Pain Management: Goal: Pain level will decrease Outcome: Progressing   Problem: Skin Integrity: Goal: Wound healing without signs and symptoms infection will improve Outcome: Progressing   Problem: Education: Goal: Knowledge of General Education information will improve Description: Including pain rating scale, medication(s)/side effects and non-pharmacologic comfort measures Outcome: Progressing   Problem: Health Behavior/Discharge Planning: Goal: Ability to manage health-related needs will improve Outcome: Progressing   Problem: Clinical Measurements: Goal: Ability to maintain clinical measurements within normal limits will improve Outcome: Progressing Goal: Will remain free from infection Outcome: Progressing Goal: Diagnostic test results will improve Outcome: Progressing Goal: Respiratory complications will improve Outcome: Progressing Goal: Cardiovascular complication will be avoided Outcome: Progressing   Problem: Activity: Goal: Risk for activity intolerance will decrease Outcome: Progressing   Problem: Nutrition: Goal: Adequate nutrition will be maintained Outcome: Progressing   Problem: Coping: Goal: Level of anxiety will decrease Outcome: Progressing   Problem: Elimination: Goal: Will not experience complications related to bowel motility Outcome: Progressing Goal:  Will not experience complications related to urinary retention Outcome: Progressing   Problem: Pain Managment: Goal: General experience of comfort will improve Outcome: Progressing   Problem: Safety: Goal: Ability to remain free from injury will improve Outcome: Progressing   

## 2020-05-08 ENCOUNTER — Inpatient Hospital Stay (HOSPITAL_COMMUNITY): Payer: Medicare Other

## 2020-05-08 LAB — GLUCOSE, CAPILLARY
Glucose-Capillary: 103 mg/dL — ABNORMAL HIGH (ref 70–99)
Glucose-Capillary: 108 mg/dL — ABNORMAL HIGH (ref 70–99)
Glucose-Capillary: 119 mg/dL — ABNORMAL HIGH (ref 70–99)

## 2020-05-08 LAB — COMPREHENSIVE METABOLIC PANEL
ALT: 27 U/L (ref 0–44)
AST: 27 U/L (ref 15–41)
Albumin: 3.6 g/dL (ref 3.5–5.0)
Alkaline Phosphatase: 61 U/L (ref 38–126)
Anion gap: 10 (ref 5–15)
BUN: 17 mg/dL (ref 8–23)
CO2: 23 mmol/L (ref 22–32)
Calcium: 9.1 mg/dL (ref 8.9–10.3)
Chloride: 106 mmol/L (ref 98–111)
Creatinine, Ser: 0.85 mg/dL (ref 0.61–1.24)
GFR calc Af Amer: >60 mL/min (ref >60)
GFR calc non Af Amer: >60 mL/min (ref >60)
Glucose, Bld: 110 mg/dL — ABNORMAL HIGH (ref 70–99)
Potassium: 3.8 mmol/L (ref 3.5–5.1)
Sodium: 139 mmol/L (ref 135–145)
Total Bilirubin: 0.8 mg/dL (ref 0.3–1.2)
Total Protein: 6.8 g/dL (ref 6.5–8.1)

## 2020-05-08 LAB — CBC
HCT: 31.7 % — ABNORMAL LOW (ref 39.0–52.0)
Hemoglobin: 9.6 g/dL — ABNORMAL LOW (ref 13.0–17.0)
MCH: 23.5 pg — ABNORMAL LOW (ref 26.0–34.0)
MCHC: 30.3 g/dL (ref 30.0–36.0)
MCV: 77.7 fL — ABNORMAL LOW (ref 80.0–100.0)
Platelets: 234 10*3/uL (ref 150–400)
RBC: 4.08 MIL/uL — ABNORMAL LOW (ref 4.22–5.81)
RDW: 26.8 % — ABNORMAL HIGH (ref 11.5–15.5)
WBC: 9.5 10*3/uL (ref 4.0–10.5)
nRBC: 0 % (ref 0.0–0.2)

## 2020-05-08 MED ORDER — TRAMADOL HCL 50 MG PO TABS: 50.0000 mg | ORAL_TABLET | Freq: Four times a day (QID) | ORAL | 0 refills | Status: AC | PRN

## 2020-05-08 NOTE — Progress Notes (Addendum)
[address]       [place] [address]             [phone]      2 Days Post-Op Procedure(s) (LRB): XI ROBOTIC ASSISTED THORASCOPY-DIAPHRAGM PLICATION (Right) INTERCOSTAL NERVE BLOCK (Right) Subjective: Feels well  Objective: Vital signs in last 24 hours: Temp:  [97.7 F (36.5 C)-98.6 F (37 C)] 97.7 F (36.5 C) (06/12 0730) Pulse Rate:  [70-80] 70 (06/12 0730) Cardiac Rhythm: Normal sinus rhythm (06/12 0700) Resp:  [13-20] 19 (06/12 0332) BP: (131-153)/(75-88) 139/86 (06/12 0730) SpO2:  [98 %-100 %] 100 % (06/12 0730)  Hemodynamic parameters for last 24 hours:    Intake/Output from previous day: 06/11 0701 - 06/12 0700 In: 480 [P.O.:480] Out: 1270 [Urine:1100; Chest Tube:170] Intake/Output this shift: Total I/O In: 240 [P.O.:240] Out: 430 [Urine:400; Chest Tube:30]  General appearance: alert, cooperative and no distress Heart: regular rate and rhythm Lungs: clear to auscultation bilaterally Abdomen: benign Extremities: no edema Wound: incis healing well  Lab Results: Recent Labs    05/07/20 0314 05/08/20 0240  WBC 10.1 9.5  HGB 10.4* 9.6*  HCT 34.4* 31.7*  PLT 303 234   BMET:  Recent Labs    05/07/20 0314 05/08/20 0240  NA 137 139  K 3.7 3.8  CL 106 106  CO2 25 23  GLUCOSE 121* 110*  BUN 14 17  CREATININE 0.94 0.85  CALCIUM 9.4 9.1    PT/INR: No results for input(s): LABPROT, INR in the last 72 hours. ABG    Component Value Date/Time   PHART 7.397 05/04/2020 1118   HCO3 21.0 05/04/2020 1118   TCO2 19 (L) 08/12/2019 2022   ACIDBASEDEF 3.0 (H) 05/04/2020 1118   O2SAT 98.7 05/04/2020 1118   CBG (last 3)  Recent Labs    05/07/20 1739 05/08/20 0021 05/08/20 0614  GLUCAP 182* 103* 108*    Meds Scheduled Meds:  acetaminophen  1,000 mg Oral Q6H   Or   acetaminophen (TYLENOL) oral liquid 160 mg/5 mL  1,000 mg Oral Q6H   amLODipine  10 mg Oral Daily   aspirin EC  81 mg Oral Daily   bisacodyl  10 mg  Oral Daily   clopidogrel  75 mg Oral Daily   ferrous sulfate  325 mg Oral Q breakfast   insulin aspart  0-24 Units Subcutaneous Q6H   ketorolac  15 mg Intravenous Q6H   metFORMIN  1,000 mg Oral BID WC   metoprolol succinate  25 mg Oral Daily   ondansetron (ZOFRAN) IV  4 mg Intravenous Q6H   pantoprazole  40 mg Oral Daily   rosuvastatin  20 mg Oral q1800   senna-docusate  1 tablet Oral QHS   Continuous Infusions: PRN Meds:.traMADol  Xrays DG CHEST PORT 1 VIEW  Result Date: 05/07/2020 CLINICAL DATA:  Follow-up pneumothorax. EXAM: PORTABLE CHEST 1 VIEW COMPARISON:  05/06/2020 FINDINGS: Right chest tube overlies the right hemidiaphragm. No pneumothorax. Previous median sternotomy and CABG procedure. Decreased lung volumes. No pulmonary edema or pleural effusion. No airspace opacities. IMPRESSION: Right chest tube in place without pneumothorax. Electronically Signed   By: Kerby Moors M.D.   On: 05/07/2020 09:31   DG Chest Port 1 View  Result Date: 05/06/2020 CLINICAL DATA:  Status post right hemidiaphragm plication. EXAM: PORTABLE CHEST 1 VIEW COMPARISON:  Chest x-ray dated May 04, 2020. FINDINGS: New right-sided chest tube. No pneumothorax. Stable cardiomediastinal silhouette status post CABG. Normal pulmonary vascularity. No consolidation or  pleural effusion. Symmetric hemidiaphragms. No acute osseous abnormality. IMPRESSION: 1. New right-sided chest tube. No pneumothorax. Electronically Signed   By: Titus Dubin M.D.   On: 05/06/2020 14:59    Assessment/Plan: S/P Procedure(s) (LRB): XI ROBOTIC ASSISTED THORASCOPY-DIAPHRAGM PLICATION (Right) INTERCOSTAL NERVE BLOCK (Right)  1 afeb, VSS, some HTN, occas sinus tachy- cont current meds 2 sats excellent on RA 3 ABL anemia- slightly lower H/H 4 normal renal fxn 5 CT 170cc yesterday- remove tube 7 home later today if CXR ok after tube removal  LOS: 2 days    Craig York 05/08/2020 Looks good- dc chest tube Home later  today  Remo Lipps C. Roxan Hockey, MD [facility] [phone]

## 2020-05-08 NOTE — Plan of Care (Signed)
  Problem: Education: Goal: Knowledge of disease or condition will improve Outcome: Completed/Met Goal: Knowledge of the prescribed therapeutic regimen will improve Outcome: Completed/Met   Problem: Activity: Goal: Risk for activity intolerance will decrease Outcome: Completed/Met   Problem: Cardiac: Goal: Will achieve and/or maintain hemodynamic stability Outcome: Completed/Met   Problem: Clinical Measurements: Goal: Postoperative complications will be avoided or minimized Outcome: Completed/Met   Problem: Respiratory: Goal: Respiratory status will improve Outcome: Completed/Met   Problem: Pain Management: Goal: Pain level will decrease Outcome: Completed/Met   Problem: Skin Integrity: Goal: Wound healing without signs and symptoms infection will improve Outcome: Completed/Met   Problem: Education: Goal: Knowledge of General Education information will improve Description: Including pain rating scale, medication(s)/side effects and non-pharmacologic comfort measures Outcome: Completed/Met   Problem: Health Behavior/Discharge Planning: Goal: Ability to manage health-related needs will improve Outcome: Completed/Met   Problem: Clinical Measurements: Goal: Ability to maintain clinical measurements within normal limits will improve Outcome: Completed/Met Goal: Will remain free from infection Outcome: Completed/Met Goal: Diagnostic test results will improve Outcome: Completed/Met Goal: Respiratory complications will improve Outcome: Completed/Met Goal: Cardiovascular complication will be avoided Outcome: Completed/Met   Problem: Activity: Goal: Risk for activity intolerance will decrease Outcome: Completed/Met   Problem: Nutrition: Goal: Adequate nutrition will be maintained Outcome: Completed/Met   Problem: Coping: Goal: Level of anxiety will decrease Outcome: Completed/Met   Problem: Elimination: Goal: Will not experience complications related to bowel  motility Outcome: Completed/Met Goal: Will not experience complications related to urinary retention Outcome: Completed/Met   Problem: Pain Managment: Goal: General experience of comfort will improve Outcome: Completed/Met   Problem: Safety: Goal: Ability to remain free from injury will improve Outcome: Completed/Met   Problem: Skin Integrity: Goal: Risk for impaired skin integrity will decrease Outcome: Completed/Met

## 2020-05-08 NOTE — Progress Notes (Signed)
Pt got discharged to home, discharge instructions provided and patient showed understanding to it, IV taken out,Telemonitor DC,pt left unit in wheelchair with all of the belongings accompanied with a family member (wife)  Lennie Dunnigan,RN 

## 2020-05-09 LAB — TYPE AND SCREEN
ABO/RH(D): A POS
Antibody Screen: NEGATIVE
Unit division: 0
Unit division: 0

## 2020-05-09 LAB — BPAM RBC
Blood Product Expiration Date: 202106302359
Blood Product Expiration Date: 202106302359
ISSUE DATE / TIME: 202106100835
ISSUE DATE / TIME: 202106100835
Unit Type and Rh: 6200
Unit Type and Rh: 6200

## 2020-05-10 ENCOUNTER — Other Ambulatory Visit: Payer: Self-pay | Admitting: Family Medicine

## 2020-05-10 MED ORDER — PANTOPRAZOLE SODIUM 40 MG PO TBEC: 40.0000 mg | DELAYED_RELEASE_TABLET | Freq: Every day | ORAL | 1 refills | Status: DC

## 2020-05-10 NOTE — Telephone Encounter (Signed)
Pt called in to follow up on refill request.    Advised of current status.

## 2020-05-10 NOTE — Telephone Encounter (Signed)
[facility] faxed refill request for the following medications:  pantoprazole (PROTONIX) 40 MG tablet   Please advise.  

## 2020-05-20 ENCOUNTER — Encounter: Payer: Self-pay | Admitting: Thoracic Surgery (Cardiothoracic Vascular Surgery)

## 2020-05-20 ENCOUNTER — Other Ambulatory Visit: Payer: Self-pay

## 2020-05-20 ENCOUNTER — Ambulatory Visit (INDEPENDENT_AMBULATORY_CARE_PROVIDER_SITE_OTHER): Payer: Self-pay | Admitting: Thoracic Surgery (Cardiothoracic Vascular Surgery)

## 2020-05-20 VITALS — BP 139/75 | HR 91 | Temp 97.9°F | Resp 20 | Ht 73.0 in | Wt 178.2 lb

## 2020-05-20 DIAGNOSIS — J986 Disorders of diaphragm: Secondary | ICD-10-CM

## 2020-05-20 NOTE — Progress Notes (Signed)
      [address]       ,[place] [address]             [phone]        Deforrest J Sheahan [place] Medical Record #[id number] Date of Birth: January 02, 1951  Referring: [name], MD Primary Care: [name], MD Primary Cardiologist:No primary care provider on file.  Reason for visit:   follow-up  History of Present Illness:     Mr. Musgrave comes in for his first follow-up appointment.  He has no complaints and states that his breathing and energy are much improved since prior to surgery.  He does have some pain at some of the incisions but is only been using his tramadol once a day.  Physical Exam: BP 139/75 (BP Location: Right Arm, Patient Position: Sitting, Cuff Size: Normal)   Pulse 91   Temp 97.9 F (36.6 C) (Temporal)   Resp 20   Ht 6\' 1"  (1.854 m)   Wt 178 lb 3.2 oz (80.8 kg)   SpO2 99% Comment: RA  BMI 23.51 kg/m   Alert NAD Incision clean, stitch removed.   Abdomen soft, ND No peripheral edema       Assessment / Plan:   69 year old male status post right robotic assisted diaphragm plication currently doing well. We will follow up in 1 month with a chest x-ray.   Lajuana Matte 05/20/2020 1:11 PM

## 2020-05-24 ENCOUNTER — Ambulatory Visit: Payer: Self-pay

## 2020-05-24 DIAGNOSIS — M5416 Radiculopathy, lumbar region: Secondary | ICD-10-CM

## 2020-05-24 MED ORDER — OXYCODONE-ACETAMINOPHEN 10-325 MG PO TABS: 1.0000 | ORAL_TABLET | Freq: Three times a day (TID) | ORAL | 0 refills | Status: DC | PRN

## 2020-05-24 NOTE — Telephone Encounter (Signed)
PT is stating that he takes "oxycodone" 325 mg twice a day that Dr F has prescribed. I see the iron at 325 twice a day but not Oxycodone. Pt states does not have bottle but wants a refill. I am not seeing this on file but pt seemed agitated and sure of this script? I told pt that a nurse would follow up with him shortly. FU # [id number]. Stated just refilled in May

## 2020-05-24 NOTE — Addendum Note (Signed)
Addended by: [name] on: 05/24/2020 04:15 PM   Modules accepted: Orders

## 2020-05-24 NOTE — Addendum Note (Signed)
Addended by: [name] on: 05/24/2020 01:08 PM   Modules accepted: Orders

## 2020-05-24 NOTE — Telephone Encounter (Signed)
Which pharmacy?

## 2020-05-24 NOTE — Telephone Encounter (Signed)
Patient called requesting pain medication. States he has taken it may years for foot pain.  Last OV 05/03/20. Will rout request to office for follow up  Reason for Disposition  Caller requesting a CONTROLLED substance prescription refill (e.g., narcotics, ADHD medicines)  Answer Assessment - Initial Assessment Questions 1. DRUG NAME: "What medicine do you need to have refilled?"     Oxycodone 10mg  acetaminophen 325 2. REFILLS REMAINING: "How many refills are remaining?" (Note: The label on the medicine or pill bottle will show how many refills are remaining. If there are no refills remaining, then a renewal may be needed.)    No refills last need 05/08/20 Dc at discharge 3. EXPIRATION DATE: "What is the expiration date?" (Note: The label states when the prescription will expire, and thus can no longer be refilled.)   Not on acive list 4. PRESCRIBING HCP: "Who prescribed it?" Reason: If prescribed by specialist, call should be referred to that group.    Dr Caryn Section 5. SYMPTOMS: "Do you have any symptoms?"    Foot pain  No swelling  7 on 10 scale 6. PREGNANCY: "Is there any chance that you are pregnant?" "When was your last menstrual period?"    N/A  Protocols used: MEDICATION REFILL AND RENEWAL CALL-A-AH

## 2020-05-24 NOTE — Telephone Encounter (Signed)
Craig York

## 2020-06-22 ENCOUNTER — Other Ambulatory Visit: Payer: Self-pay | Admitting: Family Medicine

## 2020-06-22 DIAGNOSIS — M5416 Radiculopathy, lumbar region: Secondary | ICD-10-CM

## 2020-06-22 NOTE — Telephone Encounter (Signed)
Requested medication (s) are due for refill today:yes  requested medication (s) are on the active medication list: yes  Last refill:  05/24/2020  Future visit scheduled: yes  Notes to clinic:  this refill cannot be delegated    Requested Prescriptions  Pending Prescriptions Disp Refills   oxyCODONE-acetaminophen (PERCOCET) 10-325 MG tablet 30 tablet 0    Sig: Take 1 tablet by mouth every 8 (eight) hours as needed for pain.      Not Delegated - Analgesics:  Opioid Agonist Combinations Failed - 06/22/2020  9:15 AM      Failed - This refill cannot be delegated      Failed - Urine Drug Screen completed in last 360 days.      Passed - Valid encounter within last 6 months    Recent Outpatient Visits           1 month ago Iron deficiency anemia due to chronic blood loss   [facility] [name], MD   2 months ago Type 2 diabetes mellitus with microalbuminuria, without long-term current use of insulin [facility])   [facility] [name], MD   4 months ago Type 2 diabetes mellitus with microalbuminuria, without long-term current use of insulin [facility])   [facility] [name], MD   4 months ago Type 2 diabetes mellitus with microalbuminuria, without long-term current use of insulin [facility])   [place], [place], PA-C   5 months ago Type 2 diabetes mellitus with microalbuminuria, without long-term current use of insulin [facility])   [facility] [name], MD       Future Appointments             In 1 month Fisher, Kirstie Peri, MD [facility], [facility]

## 2020-06-22 NOTE — Telephone Encounter (Signed)
Copied from [place] [id number]. Topic: Quick Communication - Rx Refill/Question >> Jun 22, 2020  9:07 AM Yvette Rack wrote: Medication: oxyCODONE-acetaminophen (PERCOCET) 10-325 MG tablet  Has the patient contacted their pharmacy? no  Preferred Pharmacy (with phone number or street name): [facility] [place] [place] Phone: [phone]   Fax: [phone]  Agent: Please be advised that RX refills may take up to 3 business days. We ask that you follow-up with your pharmacy.

## 2020-06-23 IMAGING — CR DG CHEST 2V
2 series · 2 of 2 positions shown · non-contrast
Comparison: 03/25/2020

CLINICAL DATA: Preoperative evaluation, paralyzed RIGHT diaphragm,
hypertension, diabetes mellitus, coronary artery disease

EXAM:
CHEST - 2 VIEW

[w chest pa]
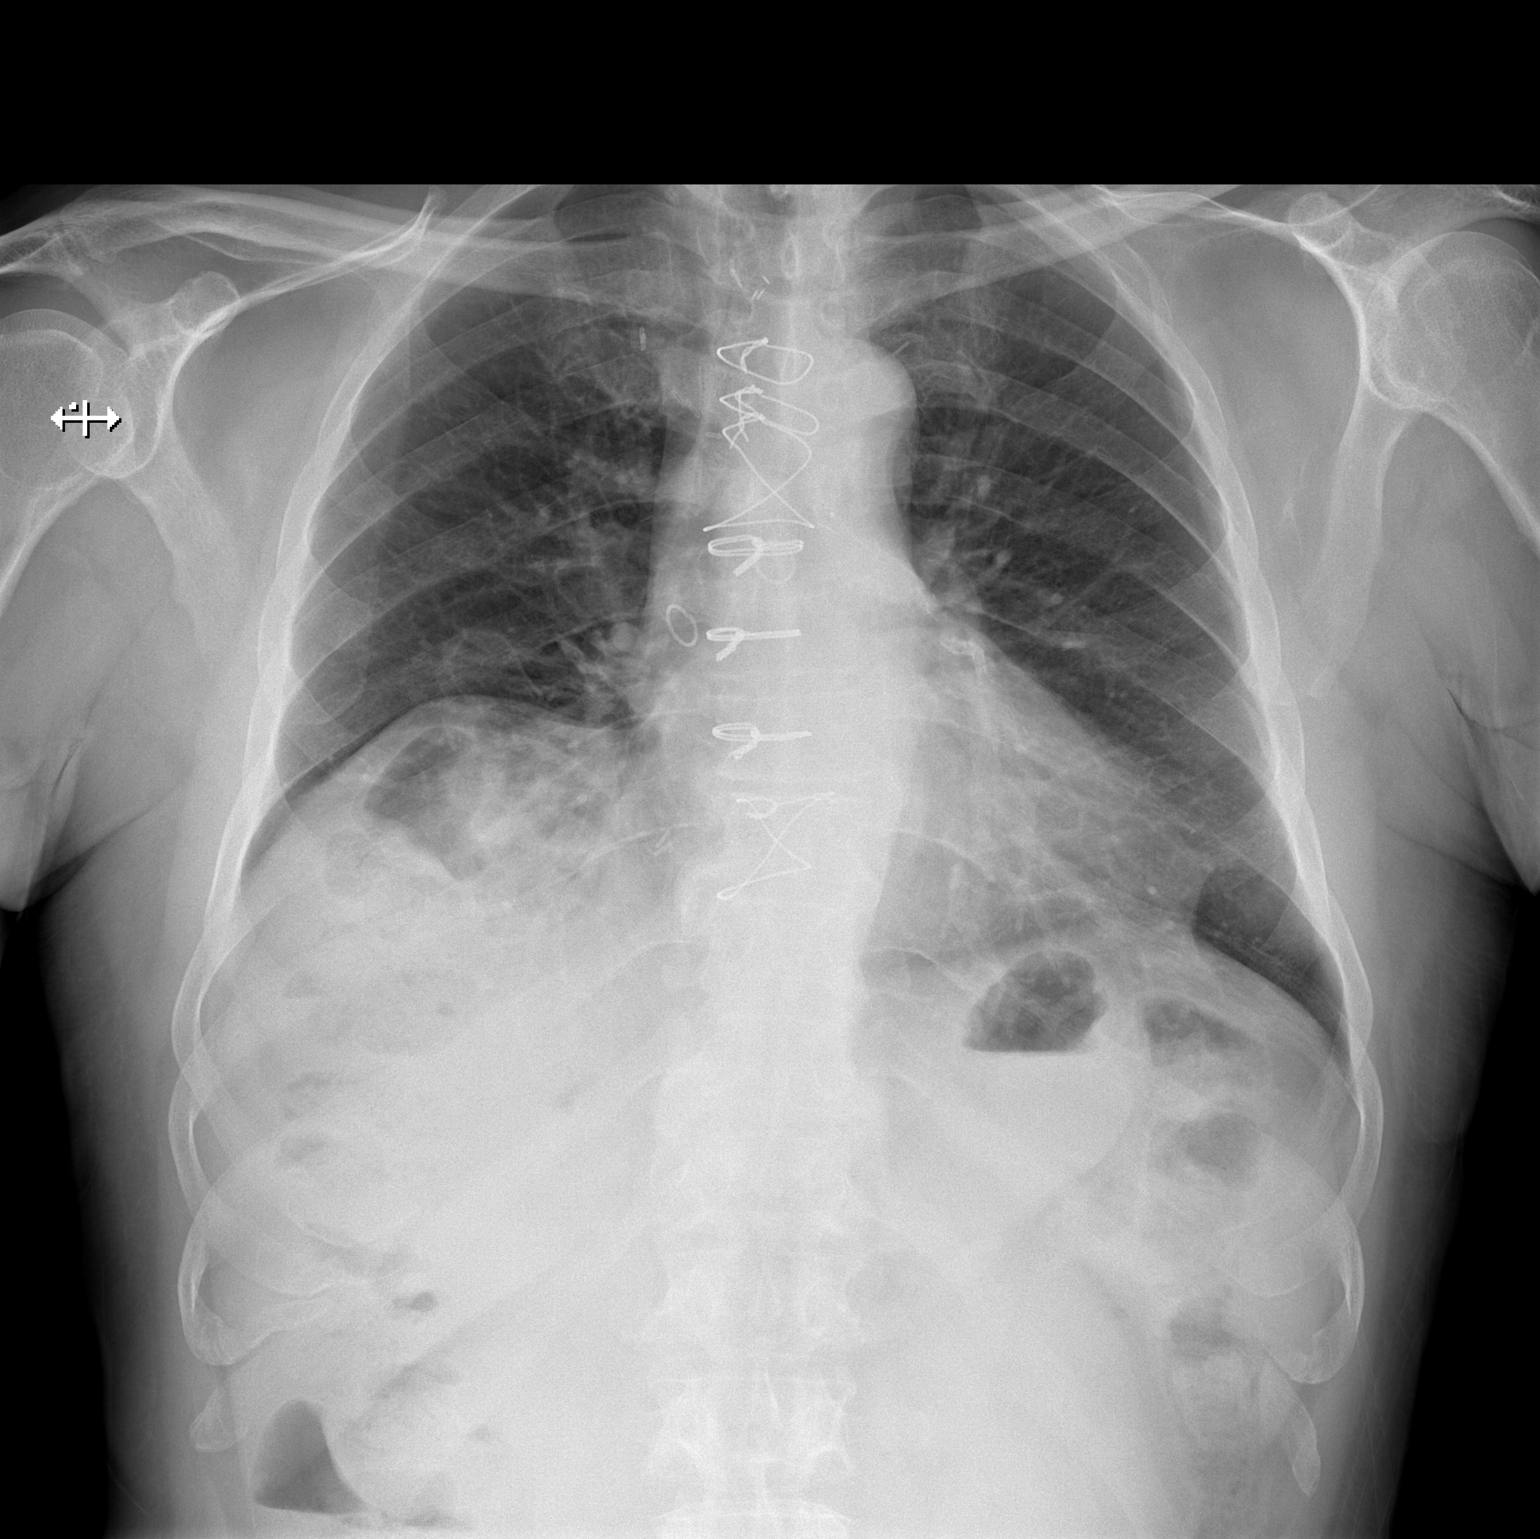

[w chest lat]
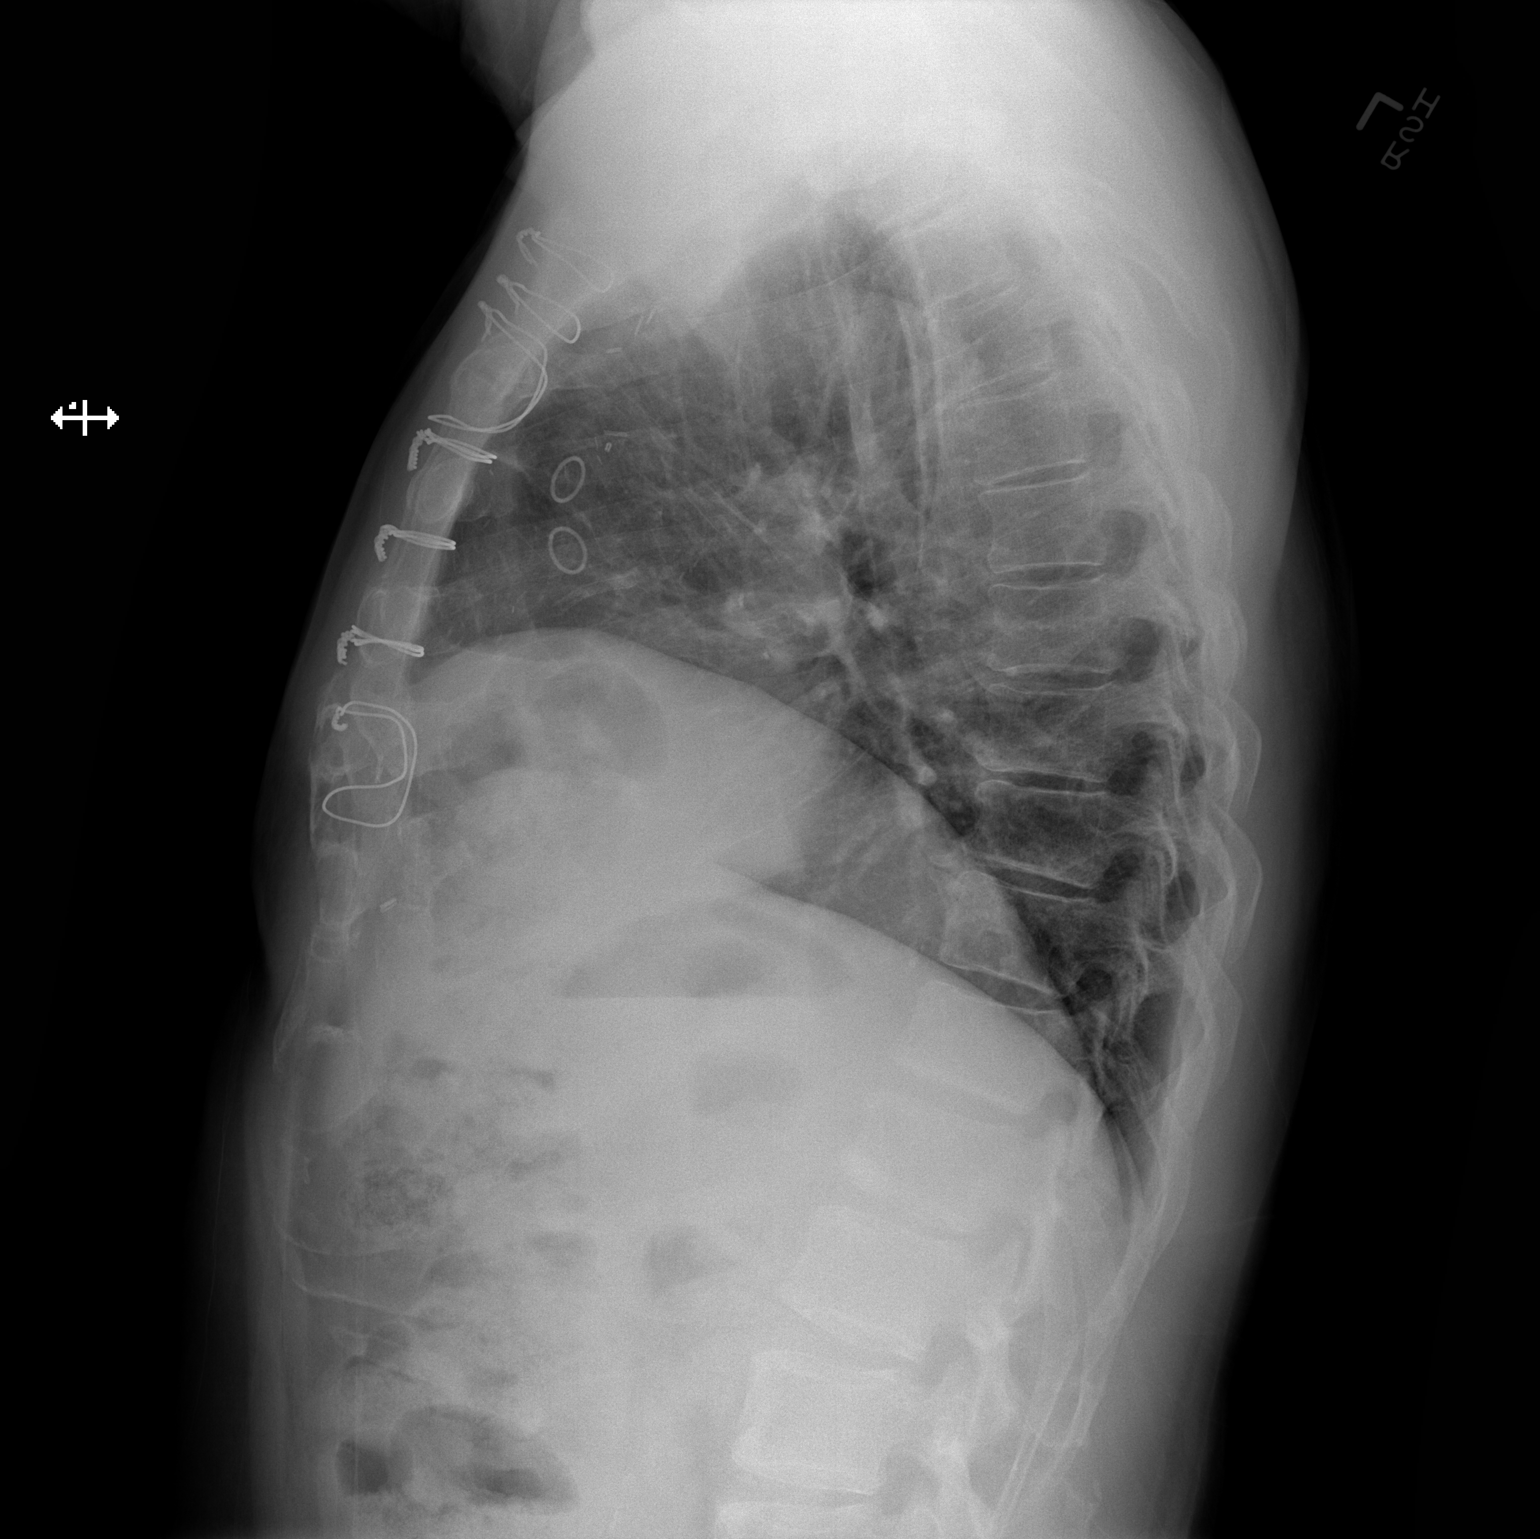

[2 of 2 positions shown; findings below may reference images not displayed]

FINDINGS: Normal heart size post CABG and coronary stenting.

Mediastinal contours and pulmonary vascularity normal.

Chronic elevation of RIGHT diaphragm with bowel interposition.

Subsegmental atelectasis RIGHT base.

Lungs otherwise clear.

No pleural effusion or pneumothorax.

Mild osseous demineralization.
IMPRESSION: Chronic elevation of RIGHT diaphragm with mild RIGHT basilar
atelectasis.

Post CABG and coronary stenting.

## 2020-06-23 MED ORDER — OXYCODONE-ACETAMINOPHEN 10-325 MG PO TABS: 1.0000 | ORAL_TABLET | Freq: Three times a day (TID) | ORAL | 0 refills | Status: DC | PRN

## 2020-06-24 ENCOUNTER — Other Ambulatory Visit: Payer: Self-pay | Admitting: Thoracic Surgery (Cardiothoracic Vascular Surgery)

## 2020-06-24 DIAGNOSIS — Z951 Presence of aortocoronary bypass graft: Secondary | ICD-10-CM

## 2020-06-25 ENCOUNTER — Ambulatory Visit (INDEPENDENT_AMBULATORY_CARE_PROVIDER_SITE_OTHER): Payer: Self-pay | Admitting: Thoracic Surgery (Cardiothoracic Vascular Surgery)

## 2020-06-25 ENCOUNTER — Ambulatory Visit
Admission: RE | Admit: 2020-06-25 | Discharge: 2020-06-25 | Disposition: A | Discharge status: Home / Self Care | Payer: Medicare Other | Source: Ambulatory Visit | Attending: Thoracic Surgery (Cardiothoracic Vascular Surgery) | Admitting: Thoracic Surgery (Cardiothoracic Vascular Surgery)

## 2020-06-25 ENCOUNTER — Encounter: Payer: Self-pay | Admitting: Thoracic Surgery (Cardiothoracic Vascular Surgery)

## 2020-06-25 ENCOUNTER — Other Ambulatory Visit: Payer: Self-pay

## 2020-06-25 VITALS — BP 160/74 | HR 79 | Temp 97.7°F | Resp 20 | Ht 73.0 in | Wt 186.0 lb

## 2020-06-25 DIAGNOSIS — Z951 Presence of aortocoronary bypass graft: Secondary | ICD-10-CM

## 2020-06-25 DIAGNOSIS — J986 Disorders of diaphragm: Secondary | ICD-10-CM

## 2020-06-25 DIAGNOSIS — I10 Essential (primary) hypertension: Secondary | ICD-10-CM | POA: Diagnosis not present

## 2020-06-25 NOTE — Progress Notes (Signed)
      [address]       ,[place] [address]             [phone]        [name] [place] Medical Record #[id number] Date of Birth: 1951-10-02  Referring: [name], MD Primary Care: [name], MD Primary Cardiologist:No primary care provider on file.  Reason for visit:   follow-up  History of Present Illness:     Craig York comes in for his 1 month follow-up.  His breathing is much improved.  His incisions are still slightly tender, but is not concerned about this.  His exercise tolerance is much improved as well.  Physical Exam: BP (!) 160/74   Pulse 79   Temp 97.7 F (36.5 C) (Skin)   Resp 20   Ht 6\' 1"  (1.854 m)   Wt 186 lb (84.4 kg)   SpO2 95% Comment: RA  BMI 24.54 kg/m   Alert NAD Incision clean.  Abdomen soft, ND No peripheral edema   Diagnostic Studies & Laboratory data: CXR: Clear     Assessment / Plan:   69 year old male status post right robotic assisted diaphragm plication for paralyzed diaphragm following CABG.  Doing quite well We will obtain PFTs in 6 months We will follow up as needed.   Lajuana Matte 06/25/2020 5:07 PM

## 2020-07-13 ENCOUNTER — Ambulatory Visit: Payer: Medicare Other | Admitting: Gastroenterology

## 2020-07-22 ENCOUNTER — Other Ambulatory Visit: Payer: Self-pay | Admitting: Family Medicine

## 2020-07-22 DIAGNOSIS — M5416 Radiculopathy, lumbar region: Secondary | ICD-10-CM

## 2020-07-22 NOTE — Telephone Encounter (Signed)
Medication: oxyCODONE-acetaminophen (PERCOCET) 10-325 MG tablet [[id number]] ,   Has the patient contacted their pharmacy? YES (Agent: If no, request that the patient contact the pharmacy for the refill.) (Agent: If yes, when and what did the pharmacy advise?)  Preferred Pharmacy (with phone number or street name): [facility] [place]  Phone:  [phone] Fax:  [phone]     Agent: Please be advised that RX refills may take up to 3 business days. We ask that you follow-up with your pharmacy.

## 2020-07-22 NOTE — Telephone Encounter (Signed)
Requested medication (s) are due for refill today:  Yes  Requested medication (s) are on the active medication list:  Yes  Future visit scheduled:  Yes  Last Refill: 06/23/20; #30; no refills   Notes to clinic:  medication is not delegated  Requested Prescriptions  Pending Prescriptions Disp Refills   oxyCODONE-acetaminophen (PERCOCET) 10-325 MG tablet 30 tablet 0    Sig: Take 1 tablet by mouth every 8 (eight) hours as needed for pain.      Not Delegated - Analgesics:  Opioid Agonist Combinations Failed - 07/22/2020  1:41 PM      Failed - This refill cannot be delegated      Failed - Urine Drug Screen completed in last 360 days.      Passed - Valid encounter within last 6 months    Recent Outpatient Visits           2 months ago Iron deficiency anemia due to chronic blood loss   [facility] [name], MD   3 months ago Type 2 diabetes mellitus with microalbuminuria, without long-term current use of insulin [facility])   [facility] [name], MD   5 months ago Type 2 diabetes mellitus with microalbuminuria, without long-term current use of insulin [facility])   [facility] [name], MD   5 months ago Type 2 diabetes mellitus with microalbuminuria, without long-term current use of insulin [facility])   [place], [place], PA-C   6 months ago Type 2 diabetes mellitus with microalbuminuria, without long-term current use of insulin [facility])   [facility] [name], MD       Future Appointments             In 2 weeks Fisher, Kirstie Peri, MD [facility], [facility]

## 2020-07-23 MED ORDER — OXYCODONE-ACETAMINOPHEN 10-325 MG PO TABS: 1.0000 | ORAL_TABLET | Freq: Three times a day (TID) | ORAL | 0 refills | Status: DC | PRN

## 2020-08-09 ENCOUNTER — Ambulatory Visit: Payer: Self-pay | Admitting: Family Medicine

## 2020-08-09 NOTE — Progress Notes (Deleted)
Established patient visit   Patient: Craig York   DOB: 01-09-1951   69 y.o. Male  MRN: [id number] Visit Date: 08/09/2020  Today's healthcare provider: Lelon Huh, MD   No chief complaint on file.  Subjective    HPI  Hypertension, follow-up  BP Readings from Last 3 Encounters:  06/25/20 (!) 160/74  05/20/20 139/75  05/08/20 130/70   Wt Readings from Last 3 Encounters:  06/25/20 186 lb (84.4 kg)  05/20/20 178 lb 3.2 oz (80.8 kg)  05/06/20 179 lb 0.2 oz (81.2 kg)     He was last seen for hypertension 3 months ago.  BP at that visit was 130/71. Management since that visit includes continuing same medications.  He reports {excellent/good/fair/poor:19665} compliance with treatment. He {is/is not:9024} having side effects. {document side effects if present:1} He is following a {diet:21022986} diet. He {is/is not:9024} exercising. He {does/does not:200015} smoke.  Use of agents associated with hypertension: NSAIDS.   Outside blood pressures are {***enter patient reported home BP readings, or 'not being checked':1}. Symptoms: {Yes/No:20286} chest pain {Yes/No:20286} chest pressure  {Yes/No:20286} palpitations {Yes/No:20286} syncope  {Yes/No:20286} dyspnea {Yes/No:20286} orthopnea  {Yes/No:20286} paroxysmal nocturnal dyspnea {Yes/No:20286} lower extremity edema   Pertinent labs: Lab Results  Component Value Date   CHOL 137 07/01/2019   HDL 41 07/01/2019   LDLCALC 72 07/01/2019   TRIG 120 07/01/2019   CHOLHDL 3.3 07/01/2019   Lab Results  Component Value Date   NA 139 05/08/2020   K 3.8 05/08/2020   CREATININE 0.85 05/08/2020   GFRNONAA >60 05/08/2020   GFRAA >60 05/08/2020   GLUCOSE 110 (H) 05/08/2020     The ASCVD Risk score (Goff DC Jr., et al., 2013) failed to calculate for the following reasons:   The patient has a prior MI or stroke diagnosis    ---------------------------------------------------------------------------------------------------  Follow up for Anemia:  The patient was last seen for this 3 months ago. Changes made at last visit include none; patient advised to continue taking supplement.  He reports {excellent/good/fair/poor:19665} compliance with treatment. He feels that condition is {improved/worse/unchanged:3041574}. He {is/is not:21021397} having side effects. ***  -----------------------------------------------------------------------------------------  Diabetes Mellitus Type II, Follow-up  Lab Results  Component Value Date   HGBA1C 7.0 (H) 04/09/2020   HGBA1C 9.7 (H) 02/20/2020   HGBA1C 11.2 (A) 01/21/2020   Wt Readings from Last 3 Encounters:  06/25/20 186 lb (84.4 kg)  05/20/20 178 lb 3.2 oz (80.8 kg)  05/06/20 179 lb 0.2 oz (81.2 kg)   Last seen for diabetes 4 months ago.  Management since then includes continue same medications. He reports {excellent/good/fair/poor:19665} compliance with treatment. He {is/is not:21021397} having side effects. {document side effects if present:1} Symptoms: {Yes/No:20286} fatigue {Yes/No:20286} foot ulcerations  {Yes/No:20286} appetite changes {Yes/No:20286} nausea  {Yes/No:20286} paresthesia of the feet  {Yes/No:20286} polydipsia  {Yes/No:20286} polyuria {Yes/No:20286} visual disturbances   {Yes/No:20286} vomiting     Home blood sugar records: {diabetes glucometry results:16657}  Episodes of hypoglycemia? {Yes/No:20286} {enter symptoms and frequency of symptoms if yes:1}   Current insulin regiment: none Most Recent Eye Exam: not UTD {Current exercise:16438:::1} {Current diet habits:16563:::1}  Pertinent Labs: Lab Results  Component Value Date   CHOL 137 07/01/2019   HDL 41 07/01/2019   LDLCALC 72 07/01/2019   TRIG 120 07/01/2019   CHOLHDL 3.3 07/01/2019   Lab Results  Component Value Date   NA 139 05/08/2020   K 3.8 05/08/2020   CREATININE  0.85 05/08/2020   GFRNONAA >60 05/08/2020  GFRAA >60 05/08/2020   GLUCOSE 110 (H) 05/08/2020     ---------------------------------------------------------------------------------------------------   {Show patient history (optional):23778::" "}   Medications: Outpatient Medications Prior to Visit  Medication Sig  . acetaminophen (TYLENOL) 500 MG tablet Take 1,000 mg by mouth every 6 (six) hours as needed for moderate pain.  [name] amLODipine (NORVASC) 10 MG tablet Take 10 mg by mouth daily.  [name] aspirin EC 81 MG tablet Take 81 mg by mouth daily.  . clopidogrel (PLAVIX) 75 MG tablet Take 1 tablet (75 mg total) by mouth daily.  . empagliflozin (JARDIANCE) 25 MG TABS tablet TAKE 1 TABLET BY MOUTH ONCE DAILY BEFORE BREAKFAST  . ferrous sulfate 325 (65 FE) MG tablet Take 1 tablet (325 mg total) by mouth 2 (two) times daily with a meal.  . metFORMIN (GLUCOPHAGE) 500 MG tablet TAKE TWO TABLETS BY MOUTH TWICE DAILY WITH A MEAL (Patient taking differently: Take 1,000 mg by mouth daily. )  . metoprolol succinate (TOPROL-XL) 25 MG 24 hr tablet Take 1 tablet (25 mg total) by mouth daily.  [name] oxyCODONE-acetaminophen (PERCOCET) 10-325 MG tablet Take 1 tablet by mouth every 8 (eight) hours as needed for pain.  . pantoprazole (PROTONIX) 40 MG tablet Take 1 tablet (40 mg total) by mouth daily.  . rosuvastatin (CRESTOR) 20 MG tablet Take 1 tablet (20 mg total) by mouth daily.  . sildenafil (VIAGRA) 100 MG tablet Take 0.5-1 tablets (50-100 mg total) by mouth daily as needed for erectile dysfunction.  Carren Rang COMPLETE 0.6 % SOLN Place 1 drop into both eyes 3 (three) times daily as needed (dry/irritated eyes.).  [name] tadalafil (CIALIS) 20 MG tablet Take 1 tablet (20 mg total) by mouth daily as needed for erectile dysfunction.   No facility-administered medications prior to visit.    Review of Systems  {Heme  Chem  Endocrine  Serology  Results Review (optional):23779::" "}  Objective    There were no  vitals taken for this visit. {Show previous vital signs (optional):23777::" "}  Physical Exam  ***  No results found for any visits on 08/09/20.  Assessment & Plan     ***  No follow-ups on file.      {provider attestation***:1}   Lelon Huh, MD  [facility] [phone] (phone) [phone] (fax)  [place]

## 2020-08-12 ENCOUNTER — Other Ambulatory Visit: Payer: Self-pay | Admitting: Family Medicine

## 2020-08-12 NOTE — Telephone Encounter (Signed)
[facility] faxed refill request for the following medications: [address] [phone] - fax   pantoprazole (PROTONIX) 40 MG tablet   Please advise. Thanks, [employer]

## 2020-08-24 ENCOUNTER — Other Ambulatory Visit: Payer: Self-pay | Admitting: Family Medicine

## 2020-08-24 DIAGNOSIS — M5416 Radiculopathy, lumbar region: Secondary | ICD-10-CM

## 2020-08-24 MED ORDER — OXYCODONE-ACETAMINOPHEN 10-325 MG PO TABS: 1.0000 | ORAL_TABLET | Freq: Three times a day (TID) | ORAL | 0 refills | Status: DC | PRN

## 2020-08-24 NOTE — Telephone Encounter (Signed)
Medication Refill - Medication: oxyCODONE-acetaminophen (PERCOCET) 10-325 MG tablet    Has the patient contacted their pharmacy?yes (Agent: If no, request that the patient contact the pharmacy for the refill.) (Agent: If yes, when and what did the pharmacy advise?)contact pcp  Preferred Pharmacy (with phone number or street name):  [facility]), [place] - [place] Phone:  [phone]  Fax:  [phone]       Agent: Please be advised that RX refills may take up to 3 business days. We ask that you follow-up with your pharmacy.

## 2020-08-24 NOTE — Telephone Encounter (Signed)
Requested medication (s) are due for refill today - yes  Requested medication (s) are on the active medication list -yes  Future visit scheduled -yes  Last refill: 07/23/20  Notes to clinic: Request for non delegated Rx  Requested Prescriptions  Pending Prescriptions Disp Refills   oxyCODONE-acetaminophen (PERCOCET) 10-325 MG tablet 30 tablet 0    Sig: Take 1 tablet by mouth every 8 (eight) hours as needed for pain.      Not Delegated - Analgesics:  Opioid Agonist Combinations Failed - 08/24/2020  9:50 AM      Failed - This refill cannot be delegated      Failed - Urine Drug Screen completed in last 360 days.      Passed - Valid encounter within last 6 months    Recent Outpatient Visits           3 months ago Iron deficiency anemia due to chronic blood loss   [facility] [name], MD   4 months ago Type 2 diabetes mellitus with microalbuminuria, without long-term current use of insulin [facility])   [facility] [name], MD   6 months ago Type 2 diabetes mellitus with microalbuminuria, without long-term current use of insulin [facility])   [facility] [name], MD   6 months ago Type 2 diabetes mellitus with microalbuminuria, without long-term current use of insulin [facility])   [facility] Linganore, [place], PA-C   7 months ago Type 2 diabetes mellitus with microalbuminuria, without long-term current use of insulin [facility])   [facility] [name], MD       Future Appointments             Tomorrow [name], MD [facility]                Requested Prescriptions  Pending Prescriptions Disp Refills   oxyCODONE-acetaminophen (PERCOCET) 10-325 MG tablet 30 tablet 0    Sig: Take 1 tablet by mouth every 8 (eight) hours as needed for pain.      Not Delegated - Analgesics:  Opioid Agonist Combinations Failed - 08/24/2020  9:50 AM      Failed - This refill  cannot be delegated      Failed - Urine Drug Screen completed in last 360 days.      Passed - Valid encounter within last 6 months    Recent Outpatient Visits           3 months ago Iron deficiency anemia due to chronic blood loss   [facility] [name], MD   4 months ago Type 2 diabetes mellitus with microalbuminuria, without long-term current use of insulin [facility])   [facility] [name], MD   6 months ago Type 2 diabetes mellitus with microalbuminuria, without long-term current use of insulin [facility])   [facility] [name], MD   6 months ago Type 2 diabetes mellitus with microalbuminuria, without long-term current use of insulin [facility])   [place], [place], PA-C   7 months ago Type 2 diabetes mellitus with microalbuminuria, without long-term current use of insulin [facility])   [place], Kirstie Peri, MD       Future Appointments             Tomorrow [name], MD [facility]

## 2020-08-25 ENCOUNTER — Other Ambulatory Visit: Payer: Self-pay

## 2020-08-25 ENCOUNTER — Ambulatory Visit (INDEPENDENT_AMBULATORY_CARE_PROVIDER_SITE_OTHER): Payer: Medicare Other | Admitting: Family Medicine

## 2020-08-25 VITALS — BP 124/80 | HR 77 | Temp 98.3°F | Wt 189.0 lb

## 2020-08-25 DIAGNOSIS — I25118 Atherosclerotic heart disease of native coronary artery with other forms of angina pectoris: Secondary | ICD-10-CM | POA: Diagnosis not present

## 2020-08-25 DIAGNOSIS — I10 Essential (primary) hypertension: Secondary | ICD-10-CM | POA: Diagnosis not present

## 2020-08-25 DIAGNOSIS — Z23 Encounter for immunization: Secondary | ICD-10-CM

## 2020-08-25 DIAGNOSIS — R809 Proteinuria, unspecified: Secondary | ICD-10-CM

## 2020-08-25 DIAGNOSIS — E1129 Type 2 diabetes mellitus with other diabetic kidney complication: Secondary | ICD-10-CM

## 2020-08-25 LAB — POCT GLYCOSYLATED HEMOGLOBIN (HGB A1C): Hemoglobin A1C: 7.1 % — AB (ref 4.0–5.6)

## 2020-08-25 NOTE — Patient Instructions (Addendum)
•   Check with Dr. Clayborn Bigness to see if you need to keep take both aspirin and (clopidogrel) Plavix.

## 2020-08-25 NOTE — Progress Notes (Signed)
Established patient visit   Patient: Craig York   DOB: 06/10/1951   69 y.o. Male  MRN: [id number] Visit Date: 08/25/2020  Today's healthcare provider: Lelon Huh, MD   Chief Complaint  Patient presents with  . Diabetes  . Hypertension   Subjective    HPI   Hypertension, follow-up     BP Readings from Last 3 Encounters:  06/25/20 (!) 160/74  05/20/20 139/75  05/08/20 130/70      Wt Readings from Last 3 Encounters:  06/25/20 186 lb (84.4 kg)  05/20/20 178 lb 3.2 oz (80.8 kg)  05/06/20 179 lb 0.2 oz (81.2 kg)     He was last seen for hypertension 3 months ago.  BP at that visit was 130/71. Management since that visit includes continuing same medications.  He reports good compliance with treatment. He is not having side effects.  He is following a Regular diet. He is exercising. He does not smoke.  Use of agents associated with hypertension: NSAIDS.   Outside blood pressures are not checked regularly. Symptoms: No chest pain No chest pressure  No palpitations No syncope  No dyspnea No orthopnea  No paroxysmal nocturnal dyspnea No lower extremity edema   Pertinent labs: Recent Labs         Lab Results  Component Value Date   CHOL 137 07/01/2019   HDL 41 07/01/2019   LDLCALC 72 07/01/2019   TRIG 120 07/01/2019   CHOLHDL 3.3 07/01/2019     Recent Labs         Lab Results  Component Value Date   NA 139 05/08/2020   K 3.8 05/08/2020   CREATININE 0.85 05/08/2020   GFRNONAA >60 05/08/2020   GFRAA >60 05/08/2020   GLUCOSE 110 (H) 05/08/2020       The ASCVD Risk score (Goff DC Jr., et al., 2013) failed to calculate for the following reasons:   The patient has a prior MI or stroke diagnosis   ---------------------------------------------------------------------------------------------------  Follow up for Anemia:  The patient was last seen for this 3 months ago. Changes made at last visit include none; patient  advised to continue taking supplement.  He reports good compliance with treatment. He is not having side effects.   -----------------------------------------------------------------------------------------  Diabetes Mellitus Type II, Follow-up  Recent Labs       Lab Results  Component Value Date   HGBA1C 7.0 (H) 04/09/2020   HGBA1C 9.7 (H) 02/20/2020   HGBA1C 11.2 (A) 01/21/2020        Wt Readings from Last 3 Encounters:  06/25/20 186 lb (84.4 kg)  05/20/20 178 lb 3.2 oz (80.8 kg)  05/06/20 179 lb 0.2 oz (81.2 kg)   Last seen for diabetes 4 months ago.  Management since then includes continue same medications. He regports good compliance with treatment. He is not having side effects.  Symptoms: No fatigue No foot ulcerations  No appetite changes No nausea  No paresthesia of the feet  No polydipsia  No polyuria No visual disturbances   No vomiting     Home blood sugar records:per patient they are normal but does not give readings.  Episodes of hypoglycemia? No           Current insulin regiment: none Most Recent Eye Exam: not UTD   Follow up CAD. Is doing very well on current medications. Is having polyuria on Jardiance. No chest pains or dyspnea. PTCA was done august 2020. He has follow up with Dr. Clayborn Bigness  scheduled next week.      Medications: Outpatient Medications Prior to Visit  Medication Sig  . acetaminophen (TYLENOL) 500 MG tablet Take 1,000 mg by mouth every 6 (six) hours as needed for moderate pain.  [name] amLODipine (NORVASC) 10 MG tablet Take 10 mg by mouth daily.  [name] aspirin EC 81 MG tablet Take 81 mg by mouth daily.  . clopidogrel (PLAVIX) 75 MG tablet Take 1 tablet (75 mg total) by mouth daily.  . empagliflozin (JARDIANCE) 25 MG TABS tablet TAKE 1 TABLET BY MOUTH ONCE DAILY BEFORE BREAKFAST  . ferrous sulfate 325 (65 FE) MG tablet Take 1 tablet (325 mg total) by mouth 2 (two) times daily with a meal.  . metFORMIN (GLUCOPHAGE) 500 MG  tablet TAKE TWO TABLETS BY MOUTH TWICE DAILY WITH A MEAL (Patient taking differently: Take 1,000 mg by mouth daily. )  . metoprolol succinate (TOPROL-XL) 25 MG 24 hr tablet Take 1 tablet (25 mg total) by mouth daily.  [name] oxyCODONE-acetaminophen (PERCOCET) 10-325 MG tablet Take 1 tablet by mouth every 8 (eight) hours as needed for pain.  . pantoprazole (PROTONIX) 40 MG tablet Take 1 tablet (40 mg total) by mouth daily.  . rosuvastatin (CRESTOR) 20 MG tablet Take 1 tablet (20 mg total) by mouth daily.  . sildenafil (VIAGRA) 100 MG tablet Take 0.5-1 tablets (50-100 mg total) by mouth daily as needed for erectile dysfunction.  Carren Rang COMPLETE 0.6 % SOLN Place 1 drop into both eyes 3 (three) times daily as needed (dry/irritated eyes.).  [name] tadalafil (CIALIS) 20 MG tablet Take 1 tablet (20 mg total) by mouth daily as needed for erectile dysfunction.   No facility-administered medications prior to visit.      Objective    BP 124/80 (BP Location: Left Arm, Patient Position: Sitting, Cuff Size: Normal)   Pulse 77   Temp 98.3 F (36.8 C) (Oral)   Wt 189 lb (85.7 kg)   SpO2 100%   BMI 24.94 kg/m    Physical Exam   General: Appearance:    Well developed, well nourished male in no acute distress  Eyes:    PERRL, conjunctiva/corneas clear, EOM's intact       Lungs:     Clear to auscultation bilaterally, respirations unlabored  Heart:    Normal heart rate. Normal rhythm. No murmurs, rubs, or gallops.   MS:   All extremities are intact.   Neurologic:   Awake, alert, oriented x 3. No apparent focal neurological           defect.        Results for orders placed or performed in visit on 08/25/20  POCT HgB A1C  Result Value Ref Range   Hemoglobin A1C 7.1 (A) 4.0 - 5.6 %    Assessment & Plan     1. Type 2 diabetes mellitus with microalbuminuria, without long-term current use of insulin (HCC) Well controlled, doing well on current medications.   2. Essential (primary) hypertension Well  controlled.  Continue current medications.    3. Coronary artery disease of native artery of native heart with stable angina pectoris (HCC) Asymptomatic. Compliant with medication.  Continue aggressive risk factor modification.  He has follow up scheduled with Dr. Clayborn Bigness.   4. Need for influenza vaccination He refused flu vaccine today.          The entirety of the information documented in the History of Present Illness, Review of Systems and Physical Exam were personally obtained by me. Portions of  this information were initially documented by the CMA and reviewed by me for thoroughness and accuracy.      Lelon Huh, MD  [facility] [phone] (phone) [phone] (fax)  [place]

## 2020-09-02 DIAGNOSIS — I2102 ST elevation (STEMI) myocardial infarction involving left anterior descending coronary artery: Secondary | ICD-10-CM | POA: Diagnosis not present

## 2020-09-02 DIAGNOSIS — I10 Essential (primary) hypertension: Secondary | ICD-10-CM | POA: Diagnosis not present

## 2020-09-02 DIAGNOSIS — Z951 Presence of aortocoronary bypass graft: Secondary | ICD-10-CM | POA: Diagnosis not present

## 2020-09-02 DIAGNOSIS — I2510 Atherosclerotic heart disease of native coronary artery without angina pectoris: Secondary | ICD-10-CM | POA: Diagnosis not present

## 2020-09-02 DIAGNOSIS — R0600 Dyspnea, unspecified: Secondary | ICD-10-CM | POA: Diagnosis not present

## 2020-09-16 NOTE — Progress Notes (Signed)
Subjective:   Craig York is a 69 y.o. male who presents for Medicare Annual/Subsequent preventive examination.  I connected with Minda Meo today by telephone and verified that I am speaking with the correct person using two identifiers. Location patient: home Location provider: work Persons participating in the virtual visit: patient, provider.   I discussed the limitations, risks, security and privacy concerns of performing an evaluation and management service by telephone and the availability of in person appointments. I also discussed with the patient that there may be a patient responsible charge related to this service. The patient expressed understanding and verbally consented to this telephonic visit.    Interactive audio and video telecommunications were attempted between this provider and patient, however failed, due to patient having technical difficulties OR patient did not have access to video capability.  We continued and completed visit with audio only.   Review of Systems    N/A  Cardiac Risk Factors include: advanced age (>83men, >71 women);diabetes mellitus;dyslipidemia;male gender;hypertension     Objective:    There were no vitals filed for this visit. There is no height or weight on file to calculate BMI.  Advanced Directives 09/20/2020 05/06/2020 05/04/2020 04/12/2020 11/09/2019 11/07/2019 10/08/2019  Does Patient Have a Medical Advance Directive? Yes Yes Yes No Yes Yes Yes  Type of Advance Directive Living will [place];Living will [place];Living will - Living will Living will [place];Living will  Does patient want to make changes to medical advance directive? - No - Patient declined No - Patient declined - No - Patient declined - No - Patient declined  Copy of [place] in Chart? - No - copy requested No - copy requested - - - No - copy requested  Would patient like  information on creating a medical advance directive? - No - Patient declined No - Patient declined No - Patient declined No - Patient declined - -    Current Medications (verified) Outpatient Encounter Medications as of 09/20/2020  Medication Sig   acetaminophen (TYLENOL) 500 MG tablet Take 1,000 mg by mouth every 6 (six) hours as needed for moderate pain.   amLODipine (NORVASC) 10 MG tablet Take 10 mg by mouth daily.   aspirin EC 81 MG tablet Take 81 mg by mouth daily.   empagliflozin (JARDIANCE) 25 MG TABS tablet TAKE 1 TABLET BY MOUTH ONCE DAILY BEFORE BREAKFAST   ferrous sulfate 325 (65 FE) MG tablet Take 1 tablet (325 mg total) by mouth 2 (two) times daily with a meal.   metFORMIN (GLUCOPHAGE) 500 MG tablet TAKE TWO TABLETS BY MOUTH TWICE DAILY WITH A MEAL (Patient taking differently: Take 1,000 mg by mouth daily. )   metoprolol succinate (TOPROL-XL) 25 MG 24 hr tablet Take 1 tablet (25 mg total) by mouth daily.   oxyCODONE-acetaminophen (PERCOCET) 10-325 MG tablet Take 1 tablet by mouth every 8 (eight) hours as needed for pain.   pantoprazole (PROTONIX) 40 MG tablet Take 1 tablet (40 mg total) by mouth daily.   rosuvastatin (CRESTOR) 20 MG tablet Take 1 tablet (20 mg total) by mouth daily.   sildenafil (VIAGRA) 100 MG tablet Take 0.5-1 tablets (50-100 mg total) by mouth daily as needed for erectile dysfunction.   SYSTANE COMPLETE 0.6 % SOLN Place 1 drop into both eyes 3 (three) times daily as needed (dry/irritated eyes.).   tadalafil (CIALIS) 20 MG tablet Take 1 tablet (20 mg total) by mouth daily as needed for  erectile dysfunction.   clopidogrel (PLAVIX) 75 MG tablet Take 1 tablet (75 mg total) by mouth daily. (Patient not taking: Reported on 09/20/2020)   No facility-administered encounter medications on file as of 09/20/2020.    Allergies (verified) Simvastatin and Penicillins   History: Past Medical History:  Diagnosis Date   Anginal pain ([place])    Benign  neoplasm of descending colon    BPH (benign prostatic hyperplasia)    Coronary artery disease    Diabetes mellitus without complication (HCC)    Type II   Hemorrhage of gastrointestinal tract 07/23/2008   Hyperlipidemia    Hypertension    Iron deficiency anemia    Myocardial infarction ([place])    Polyp of sigmoid colon    Symptomatic anemia 04/12/2020   Past Surgical History:  Procedure Laterality Date   CARDIAC CATHETERIZATION     chest surgery for fungal infection     COLONOSCOPY WITH PROPOFOL N/A 07/10/2017   Procedure: COLONOSCOPY WITH PROPOFOL;  Surgeon: [name], MD;  Location: [facility] ENDOSCOPY;  Service: Endoscopy;  Laterality: N/A;   COLONOSCOPY WITH PROPOFOL N/A 04/13/2020   Procedure: COLONOSCOPY WITH PROPOFOL;  Surgeon: [name], MD;  Location: [facility] ENDOSCOPY;  Service: Endoscopy;  Laterality: N/A;   CORONARY ARTERY BYPASS GRAFT N/A 08/12/2019   Procedure: CORONARY ARTERY BYPASS GRAFTING (CABG)X 4;  Surgeon: [name], MD;  Location: [place];  Service: Open Heart Surgery;  Laterality: N/A;  CABG x  4  using bilateral internal mammary arteries and endoscopically harvested left saphenous vein   ESOPHAGOGASTRODUODENOSCOPY (EGD) WITH PROPOFOL N/A 07/10/2017   Procedure: ESOPHAGOGASTRODUODENOSCOPY (EGD) WITH PROPOFOL;  Surgeon: [name], MD;  Location: [facility] ENDOSCOPY;  Service: Endoscopy;  Laterality: N/A;   ESOPHAGOGASTRODUODENOSCOPY (EGD) WITH PROPOFOL N/A 04/13/2020   Procedure: ESOPHAGOGASTRODUODENOSCOPY (EGD) WITH PROPOFOL;  Surgeon: [name], MD;  Location: [facility] ENDOSCOPY;  Service: Endoscopy;  Laterality: N/A;   GIVENS CAPSULE STUDY N/A 04/14/2020   Procedure: GIVENS CAPSULE STUDY;  Surgeon: Jonathon Bellows, MD;  Location: [facility] ENDOSCOPY;  Service: Gastroenterology;  Laterality: N/A;   HERNIA REPAIR     INTERCOSTAL NERVE BLOCK Right 05/06/2020   Procedure: INTERCOSTAL NERVE BLOCK;  Surgeon: Lajuana Matte, MD;  Location: [place];  Service:  Thoracic;  Laterality: Right;   LEFT HEART CATH AND CORONARY ANGIOGRAPHY Left 07/21/2019   Procedure: LEFT HEART CATH AND CORONARY ANGIOGRAPHY;  Surgeon: [name], MD;  Location: [place] CV LAB;  Service: Cardiovascular;  Laterality: Left;   TEE WITHOUT CARDIOVERSION N/A 08/12/2019   Procedure: TRANSESOPHAGEAL ECHOCARDIOGRAM (TEE);  Surgeon: [name], MD;  Location: [place];  Service: Open Heart Surgery;  Laterality: N/A;   Family History  Problem Relation Age of Onset   Hypertension Mother    Social History   Socioeconomic History   Marital status: Legally Separated    Spouse name: Not on file   Number of children: 2   Years of education: Not on file   Highest education level: High school graduate  Occupational History   Occupation: Retired    Comment: previously worked in [employer] fund[employer] working on [place] Use   Smoking status: Former Smoker   Smokeless tobacco: Former Systems developer    Types: Chew    Quit date: 11/27/1968   Tobacco comment: used 2 packs per week; quit over 40 years ago  Vaping Use   Vaping Use: Never used  Substance and Sexual Activity   Alcohol use: Not Currently    Alcohol/week: 0.0 standard drinks  Drug use: No   Sexual activity: Not on file  Other Topics Concern   Not on file  Social History Narrative   Not on file   Social Determinants of Health   Financial Resource Strain: Low Risk    Difficulty of Paying Living Expenses: Not hard at all  Food Insecurity: No Food Insecurity   Worried About Running Out of Food in the Last Year: Never true   [place] in the Last Year: Never true  Transportation Needs: No Transportation Needs   Lack of Transportation (Medical): No   Lack of Transportation (Non-Medical): No  Physical Activity: Insufficiently Active   Days of Exercise per Week: 3 days   Minutes of Exercise per Session: 30 min  Stress: No Stress Concern Present   Feeling of Stress : Not at all    Social Connections: Moderately Isolated   Frequency of Communication with Friends and Family: More than three times a week   Frequency of Social Gatherings with Friends and Family: More than three times a week   Attends Religious Services: More than 4 times per year   Active Member of [employer] or Organizations: No   Attends Archivist Meetings: Never   Marital Status: Separated    Tobacco Counseling Counseling given: Not Answered Comment: used 2 packs per week; quit over 40 years ago   Clinical Intake:  Pre-visit preparation completed: Yes  Pain : No/denies pain     Nutritional Risks: None Diabetes: Yes  How often do you need to have someone help you when you read instructions, pamphlets, or other written materials from your doctor or pharmacy?: 1 - Never  Diabetic? Yes  Nutrition Risk Assessment:  Has the patient had any N/V/D within the last 2 months?  No  Does the patient have any non-healing wounds?  No  Has the patient had any unintentional weight loss or weight gain?  No   Diabetes:  Is the patient diabetic?  Yes  If diabetic, was a CBG obtained today?  No  Did the patient bring in their glucometer from home?  No  How often do you monitor your CBG's? Twice a week.   Financial Strains and Diabetes Management:  Are you having any financial strains with the device, your supplies or your medication? No .  Does the patient want to be seen by Chronic Care Management for management of their diabetes?  No  Would the patient like to be referred to a Nutritionist or for Diabetic Management?  No   Diabetic Exams:  Diabetic Eye Exam: Completed 04/29/20. Diabetic Foot Exam: Overdue, Pt has been advised about the importance in completing this exam. Note made to follow up on this at next in office apt.    Interpreter Needed?: No  Information entered by :: [facility], LPN   Activities of Daily Living In your present state of health, do you have any  difficulty performing the following activities: 09/20/2020 08/25/2020  Hearing? N N  Vision? N N  Difficulty concentrating or making decisions? N N  Walking or climbing stairs? N N  Dressing or bathing? N N  Doing errands, shopping? N N  Preparing Food and eating ? N -  Using the Toilet? N -  In the past six months, have you accidently leaked urine? N -  Do you have problems with loss of bowel control? N -  Managing your Medications? N -  Managing your Finances? N -  Housekeeping or managing your Housekeeping? N -  Some recent data might be hidden    Patient Care Team: [name], MD as PCP - General (Family Medicine) [name], MD as Consulting Physician (Cardiology) Lajuana Matte, MD as Consulting Physician (Thoracic Surgery) Lorelee Cover., MD (Ophthalmology)  Indicate any recent Medical Services you may have received from other than [facility] providers in the past year (date may be approximate).     Assessment:   This is a routine wellness examination for [place].  Hearing/Vision screen No exam data present  Dietary issues and exercise activities discussed: Current Exercise Habits: Home exercise routine, Type of exercise: walking, Time (Minutes): 30, Frequency (Times/Week): 3, Weekly Exercise (Minutes/Week): 90, Exercise limited by: None identified  Goals   None    Depression Screen PHQ 2/9 Scores 09/20/2020 08/25/2020 09/16/2019 09/12/2018 03/05/2018 02/07/2017 10/06/2015  PHQ - 2 Score 0 0 0 0 0 0 0  PHQ- 9 Score - 0 - - 0 0 0    Fall Risk Fall Risk  09/20/2020 08/25/2020 10/08/2019 09/16/2019 09/16/2019  Falls in the past year? 0 0 0 0 0  Number falls in past yr: 0 - 0 0 0  Injury with Fall? 0 - 0 0 0  Follow up - - Falls prevention discussed - -    Any stairs in or around the home? Yes  If so, are there any without handrails? No  Home free of loose throw rugs in walkways, pet beds, electrical cords, etc? Yes  Adequate lighting in your home to  reduce risk of falls? Yes   ASSISTIVE DEVICES UTILIZED TO PREVENT FALLS:  Life alert? Yes  Use of a cane, walker or w/c? No  Grab bars in the bathroom? No  Shower chair or bench in shower? No  Elevated toilet seat or a handicapped toilet? No    Cognitive Function: Declined today.         Immunizations Immunization History  Administered Date(s) Administered   PFIZER SARS-COV-2 Vaccination 02/18/2020, 03/10/2020   Pneumococcal Polysaccharide-23 08/26/2019   Tdap 05/28/2017    TDAP status: Up to date Flu Vaccine status: Declined, Education has been provided regarding the importance of this vaccine but patient still declined. Advised may receive this vaccine at local pharmacy or Health Dept. Aware to provide a copy of the vaccination record if obtained from local pharmacy or Health Dept. Verbalized acceptance and understanding. Pneumococcal vaccine status: Declined,  Education has been provided regarding the importance of this vaccine but patient still declined. Advised may receive this vaccine at local pharmacy or Health Dept. Aware to provide a copy of the vaccination record if obtained from local pharmacy or Health Dept. Verbalized acceptance and understanding.  Covid-19 vaccine status: Completed vaccines  Qualifies for Shingles Vaccine? Yes   Zostavax completed No   Shingrix Completed?: No.    Education has been provided regarding the importance of this vaccine. Patient has been advised to call insurance company to determine out of pocket expense if they have not yet received this vaccine. Advised may also receive vaccine at local pharmacy or Health Dept. Verbalized acceptance and understanding.  Screening Tests Health Maintenance  Topic Date Due   URINE MICROALBUMIN  11/06/2018   OPHTHALMOLOGY EXAM  02/12/2019   PNA vac Low Risk Adult (2 of 2 - PCV13) 08/25/2020   INFLUENZA VACCINE  02/24/2021 (Originally 06/27/2020)   HEMOGLOBIN A1C  02/22/2021   FOOT EXAM   04/29/2021   COLONOSCOPY  04/13/2025   TETANUS/TDAP  05/29/2027   COVID-19 Vaccine  Completed   Hepatitis C Screening  Completed    Health Maintenance  Health Maintenance Due  Topic Date Due   URINE MICROALBUMIN  11/06/2018   OPHTHALMOLOGY EXAM  02/12/2019   PNA vac Low Risk Adult (2 of 2 - PCV13) 08/25/2020    Colorectal cancer screening: Completed 04/13/20. Repeat every 5 years  Lung Cancer Screening: (Low Dose CT Chest recommended if Age 30-80 years, 30 pack-year currently smoking OR have quit w/in 15years.) does not qualify.   Additional Screening:  Hepatitis C Screening: Up to date  Vision Screening: Recommended annual ophthalmology exams for early detection of glaucoma and other disorders of the eye. Is the patient up to date with their annual eye exam?  Yes  Who is the provider or what is the name of the office in which the patient attends annual eye exams? Dr Gloriann Loan  If pt is not established with a provider, would they like to be referred to a provider to establish care? No .   Dental Screening: Recommended annual dental exams for proper oral hygiene  Community Resource Referral / Chronic Care Management: CRR required this visit?  No   CCM required this visit?  No      Plan:     I have personally reviewed and noted the following in the patients chart:    Medical and social history  Use of alcohol, tobacco or illicit drugs   Current medications and supplements  Functional ability and status  Nutritional status  Physical activity  Advanced directives  List of other physicians  Hospitalizations, surgeries, and ER visits in previous 12 months  Vitals  Screenings to include cognitive, depression, and falls  Referrals and appointments  In addition, I have reviewed and discussed with patient certain preventive protocols, quality metrics, and best practice recommendations. A written personalized care plan for preventive services as well as  general preventive health recommendations were provided to patient.     Rock Sobol [place], [place]   00/71/[id number]   Nurse Notes: Prevnar 13 vaccine is due. Pt to receive at next in office apt.

## 2020-09-20 ENCOUNTER — Other Ambulatory Visit: Payer: Self-pay

## 2020-09-20 ENCOUNTER — Ambulatory Visit (INDEPENDENT_AMBULATORY_CARE_PROVIDER_SITE_OTHER): Payer: Medicare Other

## 2020-09-20 DIAGNOSIS — Z0000 Encounter for general adult medical examination without abnormal findings: Secondary | ICD-10-CM | POA: Diagnosis not present

## 2020-09-20 NOTE — Patient Instructions (Signed)
[name] , Thank you for taking time to come for your Medicare Wellness Visit. I appreciate your ongoing commitment to your health goals. Please review the following plan we discussed and let me know if I can assist you in the future.   Screening recommendations/referrals: Colonoscopy: Up to date, due 03/2025 Recommended yearly ophthalmology/optometry visit for glaucoma screening and checkup Recommended yearly dental visit for hygiene and checkup  Vaccinations: Influenza vaccine: Currently due. Declined receiving.  Pneumococcal vaccine: Prevnar 13 due. Pt to receive at next in office apt with provider.  Tdap vaccine: Up to date, due 05/2027 Shingles vaccine: Shingrix discussed. Please contact your pharmacy for coverage information.     Advanced directives: Please bring a copy of your POA (Power of Attorney) and/or Living Will to your next appointment.   Conditions/risks identified: Need to receive second pneumonia vaccine (Prevnar 13) at next in office apt.   Next appointment: 69/28/21 @ 1:25 PM for a Covid vaccine   Preventive Care 69 Years and Older, Male Preventive care refers to lifestyle choices and visits with your health care provider that can promote health and wellness. What does preventive care include?  A yearly physical exam. This is also called an annual well check.  Dental exams once or twice a year.  Routine eye exams. Ask your health care provider how often you should have your eyes checked.  Personal lifestyle choices, including:  Daily care of your teeth and gums.  Regular physical activity.  Eating a healthy diet.  Avoiding tobacco and drug use.  Limiting alcohol use.  Practicing safe sex.  Taking low doses of aspirin every day.  Taking vitamin and mineral supplements as recommended by your health care provider. What happens during an annual well check? The services and screenings done by your health care provider during your annual well check will  depend on your age, overall health, lifestyle risk factors, and family history of disease. Counseling  Your health care provider may ask you questions about your:  Alcohol use.  Tobacco use.  Drug use.  Emotional well-being.  Home and relationship well-being.  Sexual activity.  Eating habits.  History of falls.  Memory and ability to understand (cognition).  Work and work Statistician. Screening  You may have the following tests or measurements:  Height, weight, and BMI.  Blood pressure.  Lipid and cholesterol levels. These may be checked every 5 years, or more frequently if you are over 70 years old.  Skin check.  Lung cancer screening. You may have this screening every year starting at age 69 if you have a 30-pack-year history of smoking and currently smoke or have quit within the past 15 years.  Fecal occult blood test (FOBT) of the stool. You may have this test every year starting at age 52.  Flexible sigmoidoscopy or colonoscopy. You may have a sigmoidoscopy every 5 years or a colonoscopy every 10 years starting at age 40.  Prostate cancer screening. Recommendations will vary depending on your family history and other risks.  Hepatitis C blood test.  Hepatitis B blood test.  Sexually transmitted disease (STD) testing.  Diabetes screening. This is done by checking your blood sugar (glucose) after you have not eaten for a while (fasting). You may have this done every 1-3 years.  Abdominal aortic aneurysm (AAA) screening. You may need this if you are a current or former smoker.  Osteoporosis. You may be screened starting at age 69 if you are at high risk. Talk with your health  care provider about your test results, treatment options, and if necessary, the need for more tests. Vaccines  Your health care provider may recommend certain vaccines, such as:  Influenza vaccine. This is recommended every year.  Tetanus, diphtheria, and acellular pertussis (Tdap,  Td) vaccine. You may need a Td booster every 10 years.  Zoster vaccine. You may need this after age 69.  Pneumococcal 13-valent conjugate (PCV13) vaccine. One dose is recommended after age 69.  Pneumococcal polysaccharide (PPSV23) vaccine. One dose is recommended after age 69. Talk to your health care provider about which screenings and vaccines you need and how often you need them. This information is not intended to replace advice given to you by your health care provider. Make sure you discuss any questions you have with your health care provider. Document Released: 12/10/2015 Document Revised: 08/02/2016 Document Reviewed: 09/14/2015 Elsevier Interactive Patient Education  2017 [place] Prevention in the Home Falls can cause injuries. They can happen to people of all ages. There are many things you can do to make your home safe and to help prevent falls. What can I do on the outside of my home?  Regularly fix the edges of walkways and driveways and fix any cracks.  Remove anything that might make you trip as you walk through a door, such as a raised step or threshold.  Trim any bushes or trees on the path to your home.  Use bright outdoor lighting.  Clear any walking paths of anything that might make someone trip, such as rocks or tools.  Regularly check to see if handrails are loose or broken. Make sure that both sides of any steps have handrails.  Any raised decks and porches should have guardrails on the edges.  Have any leaves, snow, or ice cleared regularly.  Use sand or salt on walking paths during winter.  Clean up any spills in your garage right away. This includes oil or grease spills. What can I do in the bathroom?  Use night lights.  Install grab bars by the toilet and in the tub and shower. Do not use towel bars as grab bars.  Use non-skid mats or decals in the tub or shower.  If you need to sit down in the shower, use a plastic, non-slip  stool.  Keep the floor dry. Clean up any water that spills on the floor as soon as it happens.  Remove soap buildup in the tub or shower regularly.  Attach bath mats securely with double-sided non-slip rug tape.  Do not have throw rugs and other things on the floor that can make you trip. What can I do in the bedroom?  Use night lights.  Make sure that you have a light by your bed that is easy to reach.  Do not use any sheets or blankets that are too big for your bed. They should not hang down onto the floor.  Have a firm chair that has side arms. You can use this for support while you get dressed.  Do not have throw rugs and other things on the floor that can make you trip. What can I do in the kitchen?  Clean up any spills right away.  Avoid walking on wet floors.  Keep items that you use a lot in easy-to-reach places.  If you need to reach something above you, use a strong step stool that has a grab bar.  Keep electrical cords out of the way.  Do not use floor  polish or wax that makes floors slippery. If you must use wax, use non-skid floor wax.  Do not have throw rugs and other things on the floor that can make you trip. What can I do with my stairs?  Do not leave any items on the stairs.  Make sure that there are handrails on both sides of the stairs and use them. Fix handrails that are broken or loose. Make sure that handrails are as long as the stairways.  Check any carpeting to make sure that it is firmly attached to the stairs. Fix any carpet that is loose or worn.  Avoid having throw rugs at the top or bottom of the stairs. If you do have throw rugs, attach them to the floor with carpet tape.  Make sure that you have a light switch at the top of the stairs and the bottom of the stairs. If you do not have them, ask someone to add them for you. What else can I do to help prevent falls?  Wear shoes that:  Do not have high heels.  Have rubber bottoms.  Are  comfortable and fit you well.  Are closed at the toe. Do not wear sandals.  If you use a stepladder:  Make sure that it is fully opened. Do not climb a closed stepladder.  Make sure that both sides of the stepladder are locked into place.  Ask someone to hold it for you, if possible.  Clearly mark and make sure that you can see:  Any grab bars or handrails.  First and last steps.  Where the edge of each step is.  Use tools that help you move around (mobility aids) if they are needed. These include:  Canes.  Walkers.  Scooters.  Crutches.  Turn on the lights when you go into a dark area. Replace any light bulbs as soon as they burn out.  Set up your furniture so you have a clear path. Avoid moving your furniture around.  If any of your floors are uneven, fix them.  If there are any pets around you, be aware of where they are.  Review your medicines with your doctor. Some medicines can make you feel dizzy. This can increase your chance of falling. Ask your doctor what other things that you can do to help prevent falls. This information is not intended to replace advice given to you by your health care provider. Make sure you discuss any questions you have with your health care provider. Document Released: 09/09/2009 Document Revised: 04/20/2016 Document Reviewed: 12/18/2014 Elsevier Interactive Patient Education  2017 [employer].

## 2020-09-21 ENCOUNTER — Other Ambulatory Visit: Payer: Self-pay | Admitting: Family Medicine

## 2020-09-21 DIAGNOSIS — M5416 Radiculopathy, lumbar region: Secondary | ICD-10-CM

## 2020-09-21 MED ORDER — OXYCODONE-ACETAMINOPHEN 10-325 MG PO TABS: 1.0000 | ORAL_TABLET | Freq: Three times a day (TID) | ORAL | 0 refills | Status: DC | PRN

## 2020-09-21 NOTE — Telephone Encounter (Signed)
Requested medication (s) are due for refill today: yes  Requested medication (s) are on the active medication list: yes  Last refill:  08/24/20 #30 0 refills  Future visit scheduled: yes  Notes to clinic:  not delegated per protocol     Requested Prescriptions  Pending Prescriptions Disp Refills   oxyCODONE-acetaminophen (PERCOCET) 10-325 MG tablet 30 tablet 0    Sig: Take 1 tablet by mouth every 8 (eight) hours as needed for pain.      Not Delegated - Analgesics:  Opioid Agonist Combinations Failed - 09/21/2020  4:09 PM      Failed - This refill cannot be delegated      Failed - Urine Drug Screen completed in last 360 days.      Passed - Valid encounter within last 6 months    Recent Outpatient Visits           3 weeks ago Type 2 diabetes mellitus with microalbuminuria, without long-term current use of insulin [facility])   [facility] [name], MD   4 months ago Iron deficiency anemia due to chronic blood loss   [facility] [name], MD   5 months ago Type 2 diabetes mellitus with microalbuminuria, without long-term current use of insulin [facility])   [facility] [name], MD   7 months ago Type 2 diabetes mellitus with microalbuminuria, without long-term current use of insulin [facility])   [facility] [name], MD   7 months ago Type 2 diabetes mellitus with microalbuminuria, without long-term current use of insulin [facility])   [facility] Trinna Post, [place]       Future Appointments             In 3 months Fisher, Kirstie Peri, MD [facility], [facility]

## 2020-09-21 NOTE — Telephone Encounter (Signed)
Medication Refill - Medication:   oxyCODONE-acetaminophen (PERCOCET) 10-325 MG tablet     Preferred Pharmacy (with phone number or street name):  [id number] - [place] ([place]), [place] - [address] Phone:  [phone]  Fax:  [phone]       Agent: Please be advised that RX refills may take up to 3 business days. We ask that you follow-up with your pharmacy.  

## 2020-09-23 ENCOUNTER — Ambulatory Visit (INDEPENDENT_AMBULATORY_CARE_PROVIDER_SITE_OTHER): Payer: Medicare Other

## 2020-09-23 ENCOUNTER — Other Ambulatory Visit: Payer: Self-pay

## 2020-09-23 DIAGNOSIS — Z23 Encounter for immunization: Secondary | ICD-10-CM

## 2020-11-02 ENCOUNTER — Other Ambulatory Visit: Payer: Self-pay | Admitting: Family Medicine

## 2020-11-02 DIAGNOSIS — M5416 Radiculopathy, lumbar region: Secondary | ICD-10-CM

## 2020-11-02 MED ORDER — METFORMIN HCL 500 MG PO TABS
ORAL_TABLET | ORAL | 1 refills | Status: DC
Start: 2020-11-02 — End: 2020-12-27

## 2020-11-02 MED ORDER — OXYCODONE-ACETAMINOPHEN 10-325 MG PO TABS: 1.0000 | ORAL_TABLET | Freq: Three times a day (TID) | ORAL | 0 refills | Status: DC | PRN

## 2020-11-02 NOTE — Telephone Encounter (Signed)
Requested medication (s) are due for refill today: yes   Requested medication (s) are on the active medication list: yes   Last refill: 09/21/2020  Future visit scheduled: yes   Notes to clinic: this refill cannot be delegated    Requested Prescriptions  Pending Prescriptions Disp Refills   oxyCODONE-acetaminophen (PERCOCET) 10-325 MG tablet 30 tablet 0    Sig: Take 1 tablet by mouth every 8 (eight) hours as needed for pain.      Not Delegated - Analgesics:  Opioid Agonist Combinations Failed - 11/02/2020 11:15 AM      Failed - This refill cannot be delegated      Failed - Urine Drug Screen completed in last 360 days      Passed - Valid encounter within last 6 months    Recent Outpatient Visits           2 months ago Type 2 diabetes mellitus with microalbuminuria, without long-term current use of insulin (HCC)   [facility] Malva Limes, MD   6 months ago Iron deficiency anemia due to chronic blood loss   [facility] Malva Limes, MD   6 months ago Type 2 diabetes mellitus with microalbuminuria, without long-term current use of insulin (HCC)   [facility] Malva Limes, MD   8 months ago Type 2 diabetes mellitus with microalbuminuria, without long-term current use of insulin (HCC)   [facility] Malva Limes, MD   9 months ago Type 2 diabetes mellitus with microalbuminuria, without long-term current use of insulin [facility])   [facility], Lavella Hammock, [place]       Future Appointments             In 1 month Fisher, Demetrios Isaacs, MD [facility], [facility]             Signed Prescriptions Disp Refills   metFORMIN (GLUCOPHAGE) 500 MG tablet 360 tablet 1    Sig: TAKE TWO TABLETS BY MOUTH TWICE DAILY WITH A MEAL      Endocrinology:  Diabetes - Biguanides Passed - 11/02/2020 11:15 AM      Passed - Cr in normal range and within 360 days    Creatinine, Ser  Date Value Ref Range  Status  05/08/2020 0.85 0.61 - 1.24 mg/dL Final   Creatinine, POC  Date Value Ref Range Status  11/06/2017 n/a mg/dL Final          Passed - HBA1C is between 0 and 7.9 and within 180 days    Hemoglobin A1C  Date Value Ref Range Status  08/25/2020 7.1 (A) 4.0 - 5.6 % Final   Hgb A1c MFr Bld  Date Value Ref Range Status  04/09/2020 7.0 (H) 4.8 - 5.6 % Final    Comment:             Prediabetes: 5.7 - 6.4          Diabetes: >6.4          Glycemic control for adults with diabetes: <7.0           Passed - AA eGFR in normal range and within 360 days    GFR calc Af Amer  Date Value Ref Range Status  05/08/2020 >60 >60 mL/min Final   GFR calc non Af Amer  Date Value Ref Range Status  05/08/2020 >60 >60 mL/min Final          Passed - Valid encounter within last 6  months    Recent Outpatient Visits           2 months ago Type 2 diabetes mellitus with microalbuminuria, without long-term current use of insulin [facility])   [facility] [name], MD   6 months ago Iron deficiency anemia due to chronic blood loss   [facility] [name], MD   6 months ago Type 2 diabetes mellitus with microalbuminuria, without long-term current use of insulin [facility])   [facility] [name], MD   8 months ago Type 2 diabetes mellitus with microalbuminuria, without long-term current use of insulin [facility])   [facility] [name], MD   9 months ago Type 2 diabetes mellitus with microalbuminuria, without long-term current use of insulin [facility])   [facility] Trinna Post, PA-C       Future Appointments             In 1 month Fisher, Kirstie Peri, MD [facility], [facility]

## 2020-11-02 NOTE — Telephone Encounter (Signed)
Patient requesting metFORMIN (GLUCOPHAGE) 500 MG tablet and oxyCODONE-acetaminophen (PERCOCET) 10-325 MG tablet , informed please allow 48 to 72 hour turn around time, patient is completely out.     [place] ([place]), [place] - [address] Phone:  [phone]  Fax:  [phone]

## 2020-11-04 DIAGNOSIS — E113393 Type 2 diabetes mellitus with moderate nonproliferative diabetic retinopathy without macular edema, bilateral: Secondary | ICD-10-CM | POA: Diagnosis not present

## 2020-11-05 ENCOUNTER — Other Ambulatory Visit: Payer: Self-pay | Admitting: Thoracic Surgery (Cardiothoracic Vascular Surgery)

## 2020-11-05 DIAGNOSIS — J986 Disorders of diaphragm: Secondary | ICD-10-CM

## 2020-12-01 ENCOUNTER — Telehealth: Payer: Self-pay

## 2020-12-01 DIAGNOSIS — E785 Hyperlipidemia, unspecified: Secondary | ICD-10-CM

## 2020-12-01 DIAGNOSIS — M5416 Radiculopathy, lumbar region: Secondary | ICD-10-CM

## 2020-12-01 DIAGNOSIS — I10 Essential (primary) hypertension: Secondary | ICD-10-CM

## 2020-12-01 MED ORDER — METOPROLOL SUCCINATE ER 25 MG PO TB24: 25.0000 mg | ORAL_TABLET | Freq: Every day | ORAL | 4 refills | Status: DC

## 2020-12-01 MED ORDER — ROSUVASTATIN CALCIUM 20 MG PO TABS: 20.0000 mg | ORAL_TABLET | Freq: Every day | ORAL | 4 refills | Status: DC

## 2020-12-01 MED ORDER — AMLODIPINE BESYLATE 10 MG PO TABS: 10.0000 mg | ORAL_TABLET | Freq: Every day | ORAL | 4 refills | Status: DC

## 2020-12-01 MED ORDER — OXYCODONE-ACETAMINOPHEN 10-325 MG PO TABS: 1.0000 | ORAL_TABLET | Freq: Three times a day (TID) | ORAL | 0 refills | Status: DC | PRN

## 2020-12-01 NOTE — Telephone Encounter (Signed)
Copied from CRM [id number]. Topic: Quick Communication - Rx Refill/Question >> Dec 01, 2020 11:13 AM Adrian Prince D wrote: Medication: amlodipine 10 and rosuvastatin 20mg  and Metoprolol Succinate 25mg  and Oxycodone 10/325  Has the patient contacted their pharmacy? Yes.   (Agent: If no, request that the patient contact the pharmacy for the refill.) (Agent: If yes, when and what did the pharmacy advise?)  Preferred Pharmacy (with phone number or street name): [id number] - [place] ([place]), [place] - [address]  Agent: Please be advised that RX refills may take up to 3 business days. We ask that you follow-up with your pharmacy.

## 2020-12-09 ENCOUNTER — Other Ambulatory Visit: Payer: Self-pay | Admitting: Family Medicine

## 2020-12-09 DIAGNOSIS — E1129 Type 2 diabetes mellitus with other diabetic kidney complication: Secondary | ICD-10-CM

## 2020-12-09 DIAGNOSIS — E785 Hyperlipidemia, unspecified: Secondary | ICD-10-CM

## 2020-12-09 DIAGNOSIS — R809 Proteinuria, unspecified: Secondary | ICD-10-CM

## 2020-12-17 ENCOUNTER — Ambulatory Visit: Payer: Medicare Other | Admitting: Thoracic Surgery (Cardiothoracic Vascular Surgery)

## 2020-12-27 ENCOUNTER — Other Ambulatory Visit: Payer: Self-pay

## 2020-12-27 ENCOUNTER — Ambulatory Visit (INDEPENDENT_AMBULATORY_CARE_PROVIDER_SITE_OTHER): Payer: Medicare Other | Admitting: Family Medicine

## 2020-12-27 ENCOUNTER — Encounter: Payer: Self-pay | Admitting: Family Medicine

## 2020-12-27 VITALS — BP 134/66 | HR 73 | Temp 97.7°F | Resp 16 | Wt 188.0 lb

## 2020-12-27 DIAGNOSIS — N529 Male erectile dysfunction, unspecified: Secondary | ICD-10-CM | POA: Diagnosis not present

## 2020-12-27 DIAGNOSIS — E1129 Type 2 diabetes mellitus with other diabetic kidney complication: Secondary | ICD-10-CM | POA: Diagnosis not present

## 2020-12-27 DIAGNOSIS — M5416 Radiculopathy, lumbar region: Secondary | ICD-10-CM

## 2020-12-27 DIAGNOSIS — R809 Proteinuria, unspecified: Secondary | ICD-10-CM | POA: Diagnosis not present

## 2020-12-27 LAB — POCT GLYCOSYLATED HEMOGLOBIN (HGB A1C)
Est. average glucose Bld gHb Est-mCnc: 123
Hemoglobin A1C: 5.9 % — AB (ref 4.0–5.6)

## 2020-12-27 LAB — POCT UA - MICROALBUMIN: Microalbumin Ur, POC: NEGATIVE mg/L

## 2020-12-27 MED ORDER — METFORMIN HCL 500 MG PO TABS: 1000.0000 mg | ORAL_TABLET | Freq: Every day | ORAL | Status: DC

## 2020-12-27 MED ORDER — TADALAFIL 20 MG PO TABS: 20.0000 mg | ORAL_TABLET | Freq: Every day | ORAL | 5 refills | Status: DC | PRN

## 2020-12-27 MED ORDER — OXYCODONE-ACETAMINOPHEN 10-325 MG PO TABS: 1.0000 | ORAL_TABLET | Freq: Three times a day (TID) | ORAL | 0 refills | Status: DC | PRN

## 2020-12-27 NOTE — Progress Notes (Signed)
Established patient visit   Patient: Craig York   DOB: 11-Jul-1951   70 y.o. Male  MRN: [id number] Visit Date: 12/27/2020  Today's healthcare provider: Lelon Huh, MD   Chief Complaint  Patient presents with  . Diabetes  . Hypertension   Subjective    HPI  Diabetes Mellitus Type II, Follow-up  Lab Results  Component Value Date   HGBA1C 5.9 (A) 12/27/2020   HGBA1C 7.1 (A) 08/25/2020   HGBA1C 7.0 (H) 04/09/2020   Wt Readings from Last 3 Encounters:  12/27/20 188 lb (85.3 kg)  08/25/20 189 lb (85.7 kg)  06/25/20 186 lb (84.4 kg)   Last seen for diabetes 4 months ago.  Management since then includes continue same medications. He reports good compliance with treatment. He is not having side effects.  Symptoms: No fatigue No foot ulcerations  No appetite changes No nausea  No paresthesia of the feet  No polydipsia  No polyuria No visual disturbances   No vomiting     Home blood sugar records: blood sugars are not checked  Episodes of hypoglycemia? No    Current insulin regiment: none Most Recent Eye Exam: 11/04/2020  Current exercise: none Current diet habits: well balanced  Pertinent Labs: Lab Results  Component Value Date   CHOL 137 07/01/2019   HDL 41 07/01/2019   LDLCALC 72 07/01/2019   TRIG 120 07/01/2019   CHOLHDL 3.3 07/01/2019   Lab Results  Component Value Date   NA 139 05/08/2020   K 3.8 05/08/2020   CREATININE 0.85 05/08/2020   GFRNONAA >60 05/08/2020   GFRAA >60 05/08/2020   GLUCOSE 110 (H) 05/08/2020     ---------------------------------------------------------------------------------------------------  Hypertension, follow-up  BP Readings from Last 3 Encounters:  12/27/20 134/66  08/25/20 124/80  06/25/20 (!) 160/74   Wt Readings from Last 3 Encounters:  12/27/20 188 lb (85.3 kg)  08/25/20 189 lb (85.7 kg)  06/25/20 186 lb (84.4 kg)     He was last seen for hypertension 4 months ago.  BP at that visit was  124/80. Management since that visit includes continue same medications.  He reports good compliance with treatment. He is not having side effects.  He is following a Regular diet. He is not exercising. He does not smoke.  Use of agents associated with hypertension: NSAIDS.   Outside blood pressures are 130/68. Symptoms: No chest pain No chest pressure  No palpitations No syncope  No dyspnea No orthopnea  No paroxysmal nocturnal dyspnea No lower extremity edema   Pertinent labs: Lab Results  Component Value Date   CHOL 137 07/01/2019   HDL 41 07/01/2019   LDLCALC 72 07/01/2019   TRIG 120 07/01/2019   CHOLHDL 3.3 07/01/2019   Lab Results  Component Value Date   NA 139 05/08/2020   K 3.8 05/08/2020   CREATININE 0.85 05/08/2020   GFRNONAA >60 05/08/2020   GFRAA >60 05/08/2020   GLUCOSE 110 (H) 05/08/2020     The ASCVD Risk score (Goff DC Jr., et al., 2013) failed to calculate for the following reasons:   The patient has a prior MI or stroke diagnosis   -------------------- -------------------------------------------------------------------------------       Medications: Outpatient Medications Prior to Visit  Medication Sig  . acetaminophen (TYLENOL) 500 MG tablet Take 1,000 mg by mouth every 6 (six) hours as needed for moderate pain.  [name] amLODipine (NORVASC) 10 MG tablet Take 1 tablet (10 mg total) by mouth daily.  [name] aspirin  EC 81 MG tablet Take 81 mg by mouth daily.  . ferrous sulfate 325 (65 FE) MG tablet Take 1 tablet (325 mg total) by mouth 2 (two) times daily with a meal.  . JARDIANCE 25 MG TABS tablet TAKE 1 TABLET BY MOUTH ONCE DAILY BEFORE BREAKFAST  . metFORMIN (GLUCOPHAGE) 500 MG tablet TAKE TWO TABLETS BY MOUTH TWICE DAILY WITH A MEAL (Patient taking differently: Take 1,000 mg by mouth daily.)  . metoprolol succinate (TOPROL-XL) 25 MG 24 hr tablet Take 1 tablet (25 mg total) by mouth daily.  [name] oxyCODONE-acetaminophen (PERCOCET) 10-325 MG tablet Take 1  tablet by mouth every 8 (eight) hours as needed for pain.  . pantoprazole (PROTONIX) 40 MG tablet Take 1 tablet (40 mg total) by mouth daily.  . rosuvastatin (CRESTOR) 20 MG tablet TAKE 1 TABLET BY MOUTH  DAILY  . sildenafil (VIAGRA) 100 MG tablet Take 0.5-1 tablets (50-100 mg total) by mouth daily as needed for erectile dysfunction.  Carren Rang COMPLETE 0.6 % SOLN Place 1 drop into both eyes 3 (three) times daily as needed (dry/irritated eyes.).  [name] tadalafil (CIALIS) 20 MG tablet Take 1 tablet (20 mg total) by mouth daily as needed for erectile dysfunction.  . clopidogrel (PLAVIX) 75 MG tablet Take 1 tablet (75 mg total) by mouth daily. (Patient not taking: No sig reported)   No facility-administered medications prior to visit.    Review of Systems     Objective    BP 134/66 (BP Location: Left Arm, Patient Position: Sitting, Cuff Size: Normal)   Pulse 73   Temp 97.7 F (36.5 C) (Temporal)   Resp 16   Wt 188 lb (85.3 kg)   BMI 24.80 kg/m     Physical Exam   General: Appearance:    Well developed, well nourished male in no acute distress  Eyes:    PERRL, conjunctiva/corneas clear, EOM's intact       Lungs:     Clear to auscultation bilaterally, respirations unlabored  Heart:    Normal heart rate. Normal rhythm. No murmurs, rubs, or gallops.   MS:   All extremities are intact.   Neurologic:   Awake, alert, oriented x 3. No apparent focal neurological           defect.         Results for orders placed or performed in visit on 12/27/20  POCT HgB A1C  Result Value Ref Range   Hemoglobin A1C 5.9 (A) 4.0 - 5.6 %   Est. average glucose Bld gHb Est-mCnc 123   POCT UA - Microalbumin  Result Value Ref Range   Microalbumin Ur, POC negative mg/L    Assessment & Plan     1. Type 2 diabetes mellitus with microalbuminuria, without long-term current use of insulin (HCC) Very well controlled, given samples of Jardiance 25 x 6 weeks. He is only taking metformin once a day Follow up 4  months.   2. Lumbar radiculopathy refill- oxyCODONE-acetaminophen (PERCOCET) 10-325 MG tablet; Take 1 tablet by mouth every 8 (eight) hours as needed for pain.  Dispense: 30 tablet; Refill: 0  3. ED (erectile dysfunction) of organic origin refill - tadalafil (CIALIS) 20 MG tablet; Take 1 tablet (20 mg total) by mouth daily as needed for erectile dysfunction.  Dispense: 10 tablet; Refill: 5        The entirety of the information documented in the History of Present Illness, Review of Systems and Physical Exam were personally obtained by me. Portions of  this information were initially documented by the CMA and reviewed by me for thoroughness and accuracy.      Lelon Huh, MD  [facility] [phone] (phone) [phone] (fax)  [place]

## 2021-01-12 DIAGNOSIS — I10 Essential (primary) hypertension: Secondary | ICD-10-CM | POA: Diagnosis not present

## 2021-01-12 DIAGNOSIS — R0600 Dyspnea, unspecified: Secondary | ICD-10-CM | POA: Diagnosis not present

## 2021-01-12 DIAGNOSIS — Z955 Presence of coronary angioplasty implant and graft: Secondary | ICD-10-CM | POA: Diagnosis not present

## 2021-01-12 DIAGNOSIS — I25118 Atherosclerotic heart disease of native coronary artery with other forms of angina pectoris: Secondary | ICD-10-CM | POA: Diagnosis not present

## 2021-01-12 DIAGNOSIS — Z951 Presence of aortocoronary bypass graft: Secondary | ICD-10-CM | POA: Diagnosis not present

## 2021-01-12 DIAGNOSIS — E782 Mixed hyperlipidemia: Secondary | ICD-10-CM | POA: Diagnosis not present

## 2021-01-12 DIAGNOSIS — E119 Type 2 diabetes mellitus without complications: Secondary | ICD-10-CM | POA: Diagnosis not present

## 2021-01-12 DIAGNOSIS — K921 Melena: Secondary | ICD-10-CM | POA: Diagnosis not present

## 2021-01-25 ENCOUNTER — Other Ambulatory Visit: Payer: Self-pay | Admitting: Family Medicine

## 2021-01-25 DIAGNOSIS — M5416 Radiculopathy, lumbar region: Secondary | ICD-10-CM

## 2021-01-25 MED ORDER — OXYCODONE-ACETAMINOPHEN 10-325 MG PO TABS: 1.0000 | ORAL_TABLET | Freq: Three times a day (TID) | ORAL | 0 refills | Status: DC | PRN

## 2021-01-25 NOTE — Telephone Encounter (Signed)
Requested medication (s) are due for refill today:   Provider to review  Requested medication (s) are on the active medication list:   Yes  Future visit scheduled:   Yes   Last ordered: 12/27/2020 #30, 0 refills  Clinic note:  Non delegated refill   Requested Prescriptions  Pending Prescriptions Disp Refills   oxyCODONE-acetaminophen (PERCOCET) 10-325 MG tablet 30 tablet 0    Sig: Take 1 tablet by mouth every 8 (eight) hours as needed for pain.      Not Delegated - Analgesics:  Opioid Agonist Combinations Failed - 01/25/2021 11:13 AM      Failed - This refill cannot be delegated      Failed - Urine Drug Screen completed in last 360 days      Passed - Valid encounter within last 6 months    Recent Outpatient Visits           4 weeks ago Type 2 diabetes mellitus with microalbuminuria, without long-term current use of insulin [facility])   [facility] [name], MD   5 months ago Type 2 diabetes mellitus with microalbuminuria, without long-term current use of insulin [facility])   [facility] [name], MD   8 months ago Iron deficiency anemia due to chronic blood loss   [facility] [name], MD   9 months ago Type 2 diabetes mellitus with microalbuminuria, without long-term current use of insulin [facility])   [facility] [name], MD   11 months ago Type 2 diabetes mellitus with microalbuminuria, without long-term current use of insulin [facility])   [facility] [name], MD       Future Appointments             In 2 months Fisher, Kirstie Peri, MD [facility], [facility]

## 2021-01-25 NOTE — Telephone Encounter (Signed)
Medication Refill - Medication:   oxyCODONE-acetaminophen (PERCOCET) 10-325 MG tablet     Preferred Pharmacy (with phone number or street name):  [facility] - [place] ([place]), [place] - [address] Phone:  [phone]  Fax:  [phone]       Agent: Please be advised that RX refills may take up to 3 business days. We ask that you follow-up with your pharmacy.  

## 2021-02-04 ENCOUNTER — Other Ambulatory Visit: Payer: Self-pay | Admitting: Family Medicine

## 2021-02-04 DIAGNOSIS — I10 Essential (primary) hypertension: Secondary | ICD-10-CM

## 2021-02-04 MED ORDER — AMLODIPINE BESYLATE 10 MG PO TABS: 10.0000 mg | ORAL_TABLET | Freq: Every day | ORAL | 1 refills | Status: DC

## 2021-02-04 MED ORDER — PANTOPRAZOLE SODIUM 40 MG PO TBEC: 40.0000 mg | DELAYED_RELEASE_TABLET | Freq: Every day | ORAL | 0 refills | Status: DC

## 2021-02-04 NOTE — Telephone Encounter (Signed)
Medication Refill - Medication: Amlodipine,pantoprazole   Has the patient contacted their pharmacy? Yes.   Pt states that he contacted the pharmacy and that they need a new prescription. Pt states that he only has 2 left. Please advise.  (Agent: If no, request that the patient contact the pharmacy for the refill.) (Agent: If yes, when and what did the pharmacy advise?)  Preferred Pharmacy (with phone number or street name):  [facility] ([address]), [place] [address]  [place] [address]  Phone: [phone] Fax: [phone]  Hours: Not open 24 hours     Agent: Please be advised that RX refills may take up to 3 business days. We ask that you follow-up with your pharmacy.

## 2021-02-22 NOTE — Progress Notes (Signed)
This encounter was created in error - please disregard.

## 2021-02-23 ENCOUNTER — Other Ambulatory Visit: Payer: Self-pay | Admitting: Family Medicine

## 2021-02-23 DIAGNOSIS — M5416 Radiculopathy, lumbar region: Secondary | ICD-10-CM

## 2021-02-23 MED ORDER — OXYCODONE-ACETAMINOPHEN 10-325 MG PO TABS: 1.0000 | ORAL_TABLET | Freq: Three times a day (TID) | ORAL | 0 refills | Status: DC | PRN

## 2021-02-23 NOTE — Telephone Encounter (Signed)
Requested medication (s) are due for refill today - yes  Requested medication (s) are on the active medication list -yes  Future visit scheduled -yes  Last refill: 01/25/21  Notes to clinic: Request RF non delegated Rx  Requested Prescriptions  Pending Prescriptions Disp Refills   oxyCODONE-acetaminophen (PERCOCET) 10-325 MG tablet 30 tablet 0    Sig: Take 1 tablet by mouth every 8 (eight) hours as needed for pain.      Not Delegated - Analgesics:  Opioid Agonist Combinations Failed - 02/23/2021 10:04 AM      Failed - This refill cannot be delegated      Failed - Urine Drug Screen completed in last 360 days      Passed - Valid encounter within last 6 months    Recent Outpatient Visits           1 month ago Type 2 diabetes mellitus with microalbuminuria, without long-term current use of insulin [facility])   [facility] [name], MD   6 months ago Type 2 diabetes mellitus with microalbuminuria, without long-term current use of insulin [facility])   [facility] [name], MD   9 months ago Iron deficiency anemia due to chronic blood loss   [facility] [name], MD   10 months ago Type 2 diabetes mellitus with microalbuminuria, without long-term current use of insulin [facility])   [facility] [name], MD   1 year ago Type 2 diabetes mellitus with microalbuminuria, without long-term current use of insulin [facility])   [facility] [name], MD       Future Appointments             In 1 month Fisher, Kirstie Peri, MD [facility], [facility]                 Requested Prescriptions  Pending Prescriptions Disp Refills   oxyCODONE-acetaminophen (PERCOCET) 10-325 MG tablet 30 tablet 0    Sig: Take 1 tablet by mouth every 8 (eight) hours as needed for pain.      Not Delegated - Analgesics:  Opioid Agonist Combinations Failed - 02/23/2021 10:04 AM      Failed - This refill cannot  be delegated      Failed - Urine Drug Screen completed in last 360 days      Passed - Valid encounter within last 6 months    Recent Outpatient Visits           1 month ago Type 2 diabetes mellitus with microalbuminuria, without long-term current use of insulin [facility])   [facility] [name], MD   6 months ago Type 2 diabetes mellitus with microalbuminuria, without long-term current use of insulin [facility])   [facility] [name], MD   9 months ago Iron deficiency anemia due to chronic blood loss   [facility] [name], MD   10 months ago Type 2 diabetes mellitus with microalbuminuria, without long-term current use of insulin [facility])   [facility] [name], MD   1 year ago Type 2 diabetes mellitus with microalbuminuria, without long-term current use of insulin [facility])   [facility] [name], MD       Future Appointments             In 1 month Fisher, Kirstie Peri, MD [facility], [place]

## 2021-02-23 NOTE — Telephone Encounter (Signed)
Medication: oxyCODONE-acetaminophen (PERCOCET) 10-325 MG tablet Has the pt contacted their pharmacy? no Preferred pharmacy: [place] Phone:  [phone]  Fax:  [phone]      Please be advised refills may take up to 3 business days.  We ask that you follow up with your pharmacy.

## 2021-03-24 ENCOUNTER — Telehealth: Payer: Self-pay | Admitting: Family Medicine

## 2021-03-24 DIAGNOSIS — M5416 Radiculopathy, lumbar region: Secondary | ICD-10-CM

## 2021-03-24 MED ORDER — OXYCODONE-ACETAMINOPHEN 10-325 MG PO TABS: 1.0000 | ORAL_TABLET | Freq: Three times a day (TID) | ORAL | 0 refills | Status: DC | PRN

## 2021-03-24 NOTE — Telephone Encounter (Signed)
Copied from [place] [id number]. Topic: Quick Communication - Rx Refill/Question >> Mar 24, 2021  9:24 AM Yvette Rack wrote: Medication: oxyCODONE-acetaminophen (PERCOCET) 10-325 MG tablet  Has the patient contacted their pharmacy? no  Preferred Pharmacy (with phone number or street name): [facility] [place]  Phone: [phone]   Fax: [phone]  Agent: Please be advised that RX refills may take up to 3 business days. We ask that you follow-up with your pharmacy.

## 2021-03-24 NOTE — Telephone Encounter (Signed)
Pt called to report that he needs this Rx sent to   oxyCODONE-acetaminophen oxyCODONE-acetaminophen (PERCOCET) 10-325 MG tablet   [place] ([address]), [place] [address]  [place]  [address]  Phone: [phone] Fax: [phone]

## 2021-03-24 NOTE — Telephone Encounter (Signed)
Requested medication (s) are due for refill today: yes  Requested medication (s) are on the active medication list: yes   Last refill: 02/23/2021  Future visit scheduled:  yes   Notes to clinic:  this refill cannot be delegated    Requested Prescriptions  Pending Prescriptions Disp Refills   oxyCODONE-acetaminophen (PERCOCET) 10-325 MG tablet 30 tablet 0    Sig: Take 1 tablet by mouth every 8 (eight) hours as needed for pain.      There is no refill protocol information for this order

## 2021-04-18 ENCOUNTER — Other Ambulatory Visit: Payer: Self-pay

## 2021-04-18 ENCOUNTER — Encounter: Payer: Self-pay | Admitting: Family Medicine

## 2021-04-18 ENCOUNTER — Ambulatory Visit (INDEPENDENT_AMBULATORY_CARE_PROVIDER_SITE_OTHER): Payer: Medicare Other | Admitting: Family Medicine

## 2021-04-18 VITALS — BP 121/73 | HR 66 | Ht 73.0 in | Wt 189.6 lb

## 2021-04-18 DIAGNOSIS — E785 Hyperlipidemia, unspecified: Secondary | ICD-10-CM

## 2021-04-18 DIAGNOSIS — R809 Proteinuria, unspecified: Secondary | ICD-10-CM | POA: Diagnosis not present

## 2021-04-18 DIAGNOSIS — Z23 Encounter for immunization: Secondary | ICD-10-CM | POA: Diagnosis not present

## 2021-04-18 DIAGNOSIS — N529 Male erectile dysfunction, unspecified: Secondary | ICD-10-CM

## 2021-04-18 DIAGNOSIS — E1129 Type 2 diabetes mellitus with other diabetic kidney complication: Secondary | ICD-10-CM

## 2021-04-18 DIAGNOSIS — M5416 Radiculopathy, lumbar region: Secondary | ICD-10-CM

## 2021-04-18 DIAGNOSIS — I10 Essential (primary) hypertension: Secondary | ICD-10-CM | POA: Diagnosis not present

## 2021-04-18 DIAGNOSIS — R3589 Other polyuria: Secondary | ICD-10-CM

## 2021-04-18 DIAGNOSIS — Z125 Encounter for screening for malignant neoplasm of prostate: Secondary | ICD-10-CM

## 2021-04-18 LAB — POCT GLYCOSYLATED HEMOGLOBIN (HGB A1C)
Estimated Average Glucose: 120
Hemoglobin A1C: 5.8 % — AB (ref 4.0–5.6)

## 2021-04-18 LAB — POCT URINALYSIS DIPSTICK
Bilirubin, UA: NEGATIVE
Blood, UA: NEGATIVE
Glucose, UA: POSITIVE — AB
Ketones, UA: NEGATIVE
Leukocytes, UA: NEGATIVE
Nitrite, UA: NEGATIVE
Protein, UA: NEGATIVE
Spec Grav, UA: 1.015 (ref 1.010–1.025)
Urobilinogen, UA: 0.2 E.U./dL
pH, UA: 6 (ref 5.0–8.0)

## 2021-04-18 MED ORDER — OXYCODONE-ACETAMINOPHEN 10-325 MG PO TABS: 1.0000 | ORAL_TABLET | Freq: Three times a day (TID) | ORAL | 0 refills | Status: DC | PRN

## 2021-04-18 MED ORDER — TADALAFIL 20 MG PO TABS: 20.0000 mg | ORAL_TABLET | Freq: Every day | ORAL | 5 refills | Status: DC | PRN

## 2021-04-18 NOTE — Progress Notes (Signed)
Established patient visit   Patient: Craig York   DOB: [birth year]   70 y.o. Male  MRN: [id number] Visit Date: 04/18/2021  Today's healthcare provider: Lelon Huh, MD   Chief Complaint  Patient presents with  . Diabetes  . Hyperlipidemia  . Hypertension  . Back Pain   Subjective    HPI   Patient needs BP medication refill.  Diabetes Mellitus Type II, Follow-up  Lab Results  Component Value Date   HGBA1C 5.8 (A) 04/18/2021   HGBA1C 5.9 (A) 12/27/2020   HGBA1C 7.1 (A) 08/25/2020   Wt Readings from Last 3 Encounters:  04/18/21 189 lb 9.6 oz (86 kg)  12/27/20 188 lb (85.3 kg)  08/25/20 189 lb (85.7 kg)   Last seen for diabetes 3 months ago.  Management since then includes continuing same medications. He reports excellent compliance with treatment. He is not having side effects.  Symptoms: No fatigue No foot ulcerations  No appetite changes No nausea  No paresthesia of the feet  No polydipsia  No polyuria No visual disturbances   No vomiting     Home blood sugar records: n/a  Episodes of hypoglycemia? No    Current insulin regiment: none Most Recent Eye Exam: not UTD Current exercise: walking Current diet habits: well balanced  Pertinent Labs: Lab Results  Component Value Date   CHOL 137 07/01/2019   HDL 41 07/01/2019   LDLCALC 72 07/01/2019   TRIG 120 07/01/2019   CHOLHDL 3.3 07/01/2019   Lab Results  Component Value Date   NA 139 05/08/2020   K 3.8 05/08/2020   CREATININE 0.85 05/08/2020   GFRNONAA >60 05/08/2020   GFRAA >60 05/08/2020   GLUCOSE 110 (H) 05/08/2020     ---------------------------------------------------------------------------------------------------  Follow up for chronic back pain:  The patient was last seen for this 3 months ago. Changes made at last visit include none. Refill of Percocet provided.  He reports excellent compliance with treatment. He feels that condition is Unchanged. He is not having  side effects.  He takes one or two every day and finds to be very effective.   -----------------------------------------------------------------------------------------  Lipid/Cholesterol, Follow-up  Last lipid panel Other pertinent labs  Lab Results  Component Value Date   CHOL 137 07/01/2019   HDL 41 07/01/2019   LDLCALC 72 07/01/2019   TRIG 120 07/01/2019   CHOLHDL 3.3 07/01/2019   Lab Results  Component Value Date   ALT 27 05/08/2020   AST 27 05/08/2020   PLT 234 05/08/2020   TSH 1.040 04/09/2020     Current treatment includes rosuvastatin 20mg  daily.   He reports excellent compliance with treatment. He is not having side effects.   Symptoms: No chest pain No chest pressure/discomfort  No dyspnea No lower extremity edema  No numbness or tingling of extremity No orthopnea  No palpitations No paroxysmal nocturnal dyspnea  No speech difficulty No syncope   Current diet: well balanced Current exercise: walking  The ASCVD Risk score Mikey Bussing DC Jr., et al., 2013) failed to calculate for the following reasons:   The patient has a prior MI or stroke diagnosis  ---------------------------------------------------------------------------------------------------  Hypertension, follow-up  BP Readings from Last 3 Encounters:  04/18/21 121/73  12/27/20 134/66  08/25/20 124/80   Wt Readings from Last 3 Encounters:  04/18/21 189 lb 9.6 oz (86 kg)  12/27/20 188 lb (85.3 kg)  08/25/20 189 lb (85.7 kg)     He was last seen for hypertension 7 months  ago.  BP at that visit was 124/80. Management since that visit includes continuing same medication.  He reports excellent compliance with treatment. He is not having side effects.  He is following a Regular diet. He is exercising. He does not smoke.  Use of agents associated with hypertension: NSAIDS.   Outside blood pressures are 168/169 and 128/66 the last times he checked it. Symptoms: No chest pain No chest pressure   No palpitations No syncope  No dyspnea No orthopnea  No paroxysmal nocturnal dyspnea No lower extremity edema   Pertinent labs: Lab Results  Component Value Date   CHOL 137 07/01/2019   HDL 41 07/01/2019   LDLCALC 72 07/01/2019   TRIG 120 07/01/2019   CHOLHDL 3.3 07/01/2019   Lab Results  Component Value Date   NA 139 05/08/2020   K 3.8 05/08/2020   CREATININE 0.85 05/08/2020   GFRNONAA >60 05/08/2020   GFRAA >60 05/08/2020   GLUCOSE 110 (H) 05/08/2020     The ASCVD Risk score (Goff DC Jr., et al., 2013) failed to calculate for the following reasons:   The patient has a prior MI or stroke diagnosis   ---------------------------------------------------------------------------------------------------      Medications: Outpatient Medications Prior to Visit  Medication Sig  . acetaminophen (TYLENOL) 500 MG tablet Take 1,000 mg by mouth every 6 (six) hours as needed for moderate pain.  [name] amLODipine (NORVASC) 10 MG tablet Take 1 tablet (10 mg total) by mouth daily.  [name] aspirin EC 81 MG tablet Take 81 mg by mouth daily.  . ferrous sulfate 325 (65 FE) MG tablet Take 1 tablet (325 mg total) by mouth 2 (two) times daily with a meal.  . JARDIANCE 25 MG TABS tablet TAKE 1 TABLET BY MOUTH ONCE DAILY BEFORE BREAKFAST  . metFORMIN (GLUCOPHAGE) 500 MG tablet Take 2 tablets (1,000 mg total) by mouth daily.  . metoprolol succinate (TOPROL-XL) 25 MG 24 hr tablet Take 1 tablet (25 mg total) by mouth daily.  [name] oxyCODONE-acetaminophen (PERCOCET) 10-325 MG tablet Take 1 tablet by mouth every 8 (eight) hours as needed for pain.  . pantoprazole (PROTONIX) 40 MG tablet Take 1 tablet (40 mg total) by mouth daily.  . rosuvastatin (CRESTOR) 20 MG tablet TAKE 1 TABLET BY MOUTH  DAILY  . sildenafil (VIAGRA) 100 MG tablet Take 0.5-1 tablets (50-100 mg total) by mouth daily as needed for erectile dysfunction.  Carren Rang COMPLETE 0.6 % SOLN Place 1 drop into both eyes 3 (three) times daily as needed  (dry/irritated eyes.).  [name] tadalafil (CIALIS) 20 MG tablet Take 1 tablet (20 mg total) by mouth daily as needed for erectile dysfunction.   No facility-administered medications prior to visit.    Review of Systems     Objective    BP 121/73 (BP Location: Right Arm, Patient Position: Sitting, Cuff Size: Large)   Pulse 66   Ht 6\' 1"  (1.854 m)   Wt 189 lb 9.6 oz (86 kg)   SpO2 100%   BMI 25.01 kg/m     Physical Exam    General: Appearance:     Well developed, well nourished male in no acute distress  Eyes:    PERRL, conjunctiva/corneas clear, EOM's intact       Lungs:     Clear to auscultation bilaterally, respirations unlabored  Heart:    Normal heart rate. Normal rhythm. No murmurs, rubs, or gallops.   MS:   All extremities are intact.   Neurologic:   Awake, alert,  oriented x 3. No apparent focal neurological           defect.         Results for orders placed or performed in visit on 04/18/21  POCT HgB A1C  Result Value Ref Range   Hemoglobin A1C 5.8 (A) 4.0 - 5.6 %   Estimated Average Glucose 120    Results for orders placed or performed in visit on 04/18/21  POCT urinalysis dipstick  Result Value Ref Range   Color, UA yellow    Clarity, UA clear    Glucose, UA Positive (A) Negative   Bilirubin, UA negative    Ketones, UA negative    Spec Grav, UA 1.015 1.010 - 1.025   Blood, UA negative    pH, UA 6.0 5.0 - 8.0   Protein, UA Negative Negative   Urobilinogen, UA 0.2 0.2 or 1.0 E.U./dL   Nitrite, UA negative    Leukocytes, UA Negative Negative   Appearance     Odor       Assessment & Plan     1. Type 2 diabetes mellitus with microalbuminuria, without long-term current use of insulin (HCC) Very well controlled. Continue current medications.    2. Polyuria Likely secondary to Jardiance, no sign of infections. Offered to reduce dose to 10mg  but he prefers to leave prescriptions the same.   3. Prostate cancer screening  - PSA Total (Reflex To Free)  ([facility] only)  4. Lumbar radiculopathy Well controlled on current medication regiment, rf oxyCODONE-acetaminophen (PERCOCET) 10-325 MG tablet; Take 1 tablet by mouth every 8 (eight) hours as needed for pain.  Dispense: 30 tablet; Refill: 0  5. ED (erectile dysfunction) of organic origin refill tadalafil (CIALIS) 20 MG tablet; Take 1 tablet (20 mg total) by mouth daily as needed for erectile dysfunction.  Dispense: 10 tablet; Refill: 5  6. Essential (primary) hypertension Well controlled.  Continue current medications.    7. Hyperlipidemia, unspecified hyperlipidemia type He is tolerating rosuvastatin well with no adverse effects.   - CBC - Comprehensive metabolic panel - Lipid panel  8. Need for vaccination against Streptococcus pneumoniae Recommended Prevnar 13 and covid booster today, but he doesn't want any shots today. He was scheduled to come back to covid vaccine clinic.    Future Appointments  Date Time Provider [place]  04/27/2021 11:00 AM [facility] NURSE [facility]  08/22/2021 10:40 AM [name], MD [facility]  09/27/2021  2:00 PM BFP-NURSE HEALTH ADVISOR [facility]        The entirety of the information documented in the History of Present Illness, Review of Systems and Physical Exam were personally obtained by me. Portions of this information were initially documented by the CMA and reviewed by me for thoroughness and accuracy.      Lelon Huh, MD  [facility] [phone] (phone) [phone] (fax)  [place]

## 2021-04-19 DIAGNOSIS — E785 Hyperlipidemia, unspecified: Secondary | ICD-10-CM | POA: Diagnosis not present

## 2021-04-20 LAB — COMPREHENSIVE METABOLIC PANEL
ALT: 14 IU/L (ref 0–44)
AST: 14 IU/L (ref 0–40)
Albumin/Globulin Ratio: 1.6 (ref 1.2–2.2)
Albumin: 4.2 g/dL (ref 3.8–4.8)
Alkaline Phosphatase: 69 IU/L (ref 44–121)
BUN/Creatinine Ratio: 12 (ref 10–24)
BUN: 11 mg/dL (ref 8–27)
Bilirubin Total: 0.4 mg/dL (ref 0.0–1.2)
CO2: 18 mmol/L — ABNORMAL LOW (ref 20–29)
Calcium: 9.4 mg/dL (ref 8.6–10.2)
Chloride: 105 mmol/L (ref 96–106)
Creatinine, Ser: 0.89 mg/dL (ref 0.76–1.27)
Globulin, Total: 2.7 g/dL (ref 1.5–4.5)
Glucose: 171 mg/dL — ABNORMAL HIGH (ref 65–99)
Potassium: 4.3 mmol/L (ref 3.5–5.2)
Sodium: 141 mmol/L (ref 134–144)
Total Protein: 6.9 g/dL (ref 6.0–8.5)
eGFR: 93 mL/min/{1.73_m2} (ref >59)

## 2021-04-20 LAB — CBC
Hematocrit: 27.3 % — ABNORMAL LOW (ref 37.5–51.0)
Hemoglobin: 7.6 g/dL — ABNORMAL LOW (ref 13.0–17.7)
MCH: 23.2 pg — ABNORMAL LOW (ref 26.6–33.0)
MCHC: 27.8 g/dL — ABNORMAL LOW (ref 31.5–35.7)
MCV: 83 fL (ref 79–97)
Platelets: 247 10*3/uL (ref 150–450)
RBC: 3.28 x10E6/uL — ABNORMAL LOW (ref 4.14–5.80)
RDW: 15.3 % (ref 11.6–15.4)
WBC: 4.3 10*3/uL (ref 3.4–10.8)

## 2021-04-20 LAB — LIPID PANEL
Chol/HDL Ratio: 3.4 ratio (ref 0.0–5.0)
Cholesterol, Total: 126 mg/dL (ref 100–199)
HDL: 37 mg/dL — ABNORMAL LOW (ref >39)
LDL Chol Calc (NIH): 62 mg/dL (ref 0–99)
Triglycerides: 158 mg/dL — ABNORMAL HIGH (ref 0–149)
VLDL Cholesterol Cal: 27 mg/dL (ref 5–40)

## 2021-04-20 LAB — PSA TOTAL (REFLEX TO FREE): Prostate Specific Ag, Serum: 2.9 ng/mL (ref 0.0–4.0)

## 2021-04-27 ENCOUNTER — Ambulatory Visit: Payer: Self-pay

## 2021-04-30 ENCOUNTER — Other Ambulatory Visit: Payer: Self-pay | Admitting: Family Medicine

## 2021-04-30 NOTE — Telephone Encounter (Signed)
Requested Prescriptions  Pending Prescriptions Disp Refills  . pantoprazole (PROTONIX) 40 MG tablet [Pharmacy Med Name: Pantoprazole Sodium 40 MG Oral Tablet Delayed Release] 90 tablet 0    Sig: Take 1 tablet by mouth once daily     Gastroenterology: Proton Pump Inhibitors Passed - 04/30/2021 10:08 AM      Passed - Valid encounter within last 12 months    Recent Outpatient Visits          1 week ago Type 2 diabetes mellitus with microalbuminuria, without long-term current use of insulin ([place])   [facility] [name], MD   4 months ago Type 2 diabetes mellitus with microalbuminuria, without long-term current use of insulin [facility])   [facility] [name], MD   8 months ago Type 2 diabetes mellitus with microalbuminuria, without long-term current use of insulin [facility])   [facility] [name], MD   12 months ago Iron deficiency anemia due to chronic blood loss   [facility] [name], MD   1 year ago Type 2 diabetes mellitus with microalbuminuria, without long-term current use of insulin [facility])   [facility] [name], MD      Future Appointments            In 3 months Fisher, Kirstie Peri, MD [facility], [facility]   In 6 months Fisher, Kirstie Peri, MD [facility], [place]

## 2021-05-01 ENCOUNTER — Other Ambulatory Visit: Payer: Self-pay | Admitting: Family Medicine

## 2021-05-01 DIAGNOSIS — E1129 Type 2 diabetes mellitus with other diabetic kidney complication: Secondary | ICD-10-CM

## 2021-05-01 DIAGNOSIS — R809 Proteinuria, unspecified: Secondary | ICD-10-CM

## 2021-05-03 DIAGNOSIS — H524 Presbyopia: Secondary | ICD-10-CM | POA: Diagnosis not present

## 2021-05-03 DIAGNOSIS — E113393 Type 2 diabetes mellitus with moderate nonproliferative diabetic retinopathy without macular edema, bilateral: Secondary | ICD-10-CM | POA: Diagnosis not present

## 2021-05-03 LAB — HM DIABETES EYE EXAM

## 2021-05-12 DIAGNOSIS — K921 Melena: Secondary | ICD-10-CM | POA: Diagnosis not present

## 2021-05-12 DIAGNOSIS — Z955 Presence of coronary angioplasty implant and graft: Secondary | ICD-10-CM | POA: Diagnosis not present

## 2021-05-12 DIAGNOSIS — I10 Essential (primary) hypertension: Secondary | ICD-10-CM | POA: Diagnosis not present

## 2021-05-12 DIAGNOSIS — Z951 Presence of aortocoronary bypass graft: Secondary | ICD-10-CM | POA: Diagnosis not present

## 2021-05-12 DIAGNOSIS — I25118 Atherosclerotic heart disease of native coronary artery with other forms of angina pectoris: Secondary | ICD-10-CM | POA: Diagnosis not present

## 2021-05-12 DIAGNOSIS — E782 Mixed hyperlipidemia: Secondary | ICD-10-CM | POA: Diagnosis not present

## 2021-05-12 DIAGNOSIS — R0602 Shortness of breath: Secondary | ICD-10-CM | POA: Diagnosis not present

## 2021-05-12 DIAGNOSIS — E119 Type 2 diabetes mellitus without complications: Secondary | ICD-10-CM | POA: Diagnosis not present

## 2021-05-12 DIAGNOSIS — R0600 Dyspnea, unspecified: Secondary | ICD-10-CM | POA: Diagnosis not present

## 2021-05-19 ENCOUNTER — Telehealth: Payer: Self-pay | Admitting: Family Medicine

## 2021-05-19 ENCOUNTER — Other Ambulatory Visit: Payer: Self-pay | Admitting: Family Medicine

## 2021-05-19 DIAGNOSIS — M5416 Radiculopathy, lumbar region: Secondary | ICD-10-CM

## 2021-05-19 DIAGNOSIS — I10 Essential (primary) hypertension: Secondary | ICD-10-CM

## 2021-05-19 DIAGNOSIS — D509 Iron deficiency anemia, unspecified: Secondary | ICD-10-CM

## 2021-05-19 MED ORDER — OXYCODONE-ACETAMINOPHEN 10-325 MG PO TABS: 1.0000 | ORAL_TABLET | Freq: Three times a day (TID) | ORAL | 0 refills | Status: DC | PRN

## 2021-05-19 NOTE — Telephone Encounter (Signed)
Please advise patient it is time to check iron levels and cbc. He does not need to be fasting. Please print and leave order for him at lab.

## 2021-05-19 NOTE — Telephone Encounter (Signed)
Pt called in to request a refill for 2 medications.      amLODipine (NORVASC) 10 MG tablet  oxyCODONE-acetaminophen (PERCOCET) 10-325 MG tablet  Pharmacy:  [facility] [address] [place]), [place] Phone:  [phone]  Fax:  [phone]    Pt was told to contact PCP office to request.

## 2021-05-19 NOTE — Telephone Encounter (Signed)
Requested medication (s) are due for refill today: yes  Requested medication (s) are on the active medication list: yes   Last refill:  04/18/2021  Future visit scheduled: yes  Notes to clinic: this refill cannot be delegated    Requested Prescriptions  Pending Prescriptions Disp Refills   oxyCODONE-acetaminophen (PERCOCET) 10-325 MG tablet 30 tablet 0    Sig: Take 1 tablet by mouth every 8 (eight) hours as needed for pain.      Not Delegated - Analgesics:  Opioid Agonist Combinations Failed - 05/19/2021 10:21 AM      Failed - This refill cannot be delegated      Failed - Urine Drug Screen completed in last 360 days      Passed - Valid encounter within last 6 months    Recent Outpatient Visits           1 month ago Type 2 diabetes mellitus with microalbuminuria, without long-term current use of insulin ([place])   [facility] [name], MD   4 months ago Type 2 diabetes mellitus with microalbuminuria, without long-term current use of insulin [facility])   [facility] [name], MD   8 months ago Type 2 diabetes mellitus with microalbuminuria, without long-term current use of insulin [facility])   [facility] [name], MD   1 year ago Iron deficiency anemia due to chronic blood loss   [facility] [name], MD   1 year ago Type 2 diabetes mellitus with microalbuminuria, without long-term current use of insulin [facility])   [facility] [name], MD       Future Appointments             In 3 months Fisher, Kirstie Peri, MD [facility], [place]   In 5 months Fisher, Kirstie Peri, MD [facility], [place]

## 2021-05-23 DIAGNOSIS — D509 Iron deficiency anemia, unspecified: Secondary | ICD-10-CM | POA: Diagnosis not present

## 2021-05-24 LAB — IRON,TIBC AND FERRITIN PANEL
Ferritin: 63 ng/mL (ref 30–400)
Iron Saturation: 95 % (ref 15–55)
Iron: 467 ug/dL (ref 38–169)
Total Iron Binding Capacity: 490 ug/dL — ABNORMAL HIGH (ref 250–450)
UIBC: 23 ug/dL — ABNORMAL LOW (ref 111–343)

## 2021-05-24 LAB — CBC
Hematocrit: 37.3 % — ABNORMAL LOW (ref 37.5–51.0)
Hemoglobin: 10.8 g/dL — ABNORMAL LOW (ref 13.0–17.7)
MCH: 23.6 pg — ABNORMAL LOW (ref 26.6–33.0)
MCHC: 29 g/dL — ABNORMAL LOW (ref 31.5–35.7)
MCV: 81 fL (ref 79–97)
Platelets: 280 10*3/uL (ref 150–450)
RBC: 4.58 x10E6/uL (ref 4.14–5.80)
RDW: 15.2 % (ref 11.6–15.4)
WBC: 4 10*3/uL (ref 3.4–10.8)

## 2021-05-26 DIAGNOSIS — R0600 Dyspnea, unspecified: Secondary | ICD-10-CM | POA: Diagnosis not present

## 2021-06-06 ENCOUNTER — Telehealth: Payer: Self-pay

## 2021-06-06 DIAGNOSIS — E1129 Type 2 diabetes mellitus with other diabetic kidney complication: Secondary | ICD-10-CM

## 2021-06-06 MED ORDER — EMPAGLIFLOZIN 25 MG PO TABS: 25.0000 mg | ORAL_TABLET | Freq: Every day | ORAL | 4 refills | Status: DC

## 2021-06-06 NOTE — Telephone Encounter (Signed)
Patient came up to the office requesting samples of Jardiace. Per Dr. Caryn Section "OK" to give samples. 1 month supply of samples given to patient.

## 2021-06-21 ENCOUNTER — Telehealth: Payer: Self-pay | Admitting: Family Medicine

## 2021-06-21 DIAGNOSIS — E119 Type 2 diabetes mellitus without complications: Secondary | ICD-10-CM | POA: Diagnosis not present

## 2021-06-21 DIAGNOSIS — I2510 Atherosclerotic heart disease of native coronary artery without angina pectoris: Secondary | ICD-10-CM | POA: Diagnosis not present

## 2021-06-21 DIAGNOSIS — I2102 ST elevation (STEMI) myocardial infarction involving left anterior descending coronary artery: Secondary | ICD-10-CM | POA: Diagnosis not present

## 2021-06-21 DIAGNOSIS — I25118 Atherosclerotic heart disease of native coronary artery with other forms of angina pectoris: Secondary | ICD-10-CM | POA: Diagnosis not present

## 2021-06-21 DIAGNOSIS — Z951 Presence of aortocoronary bypass graft: Secondary | ICD-10-CM | POA: Diagnosis not present

## 2021-06-21 DIAGNOSIS — M5416 Radiculopathy, lumbar region: Secondary | ICD-10-CM

## 2021-06-21 DIAGNOSIS — Z955 Presence of coronary angioplasty implant and graft: Secondary | ICD-10-CM | POA: Diagnosis not present

## 2021-06-21 DIAGNOSIS — E782 Mixed hyperlipidemia: Secondary | ICD-10-CM | POA: Diagnosis not present

## 2021-06-21 DIAGNOSIS — I10 Essential (primary) hypertension: Secondary | ICD-10-CM | POA: Diagnosis not present

## 2021-06-21 DIAGNOSIS — R0609 Other forms of dyspnea: Secondary | ICD-10-CM | POA: Diagnosis not present

## 2021-06-21 DIAGNOSIS — U071 COVID-19: Secondary | ICD-10-CM | POA: Diagnosis not present

## 2021-06-21 MED ORDER — OXYCODONE-ACETAMINOPHEN 10-325 MG PO TABS
1.0000 | ORAL_TABLET | Freq: Three times a day (TID) | ORAL | 0 refills | Status: DC | PRN
Start: 2021-06-21 — End: 2021-07-22

## 2021-06-21 NOTE — Telephone Encounter (Signed)
Pt called to check on status of refill request / he stated he went to the pharmacy / pt asked if it will be filled today / please advise

## 2021-06-21 NOTE — Telephone Encounter (Signed)
Requested medication (s) are due for refill today: yes   Requested medication (s) are on the active medication list: yes   Last refill:  05/19/2021  Future visit scheduled: yes  Notes to clinic:  This refill cannot be delegated   Requested Prescriptions  Pending Prescriptions Disp Refills   oxyCODONE-acetaminophen (PERCOCET) 10-325 MG tablet 30 tablet 0    Sig: Take 1 tablet by mouth every 8 (eight) hours as needed for pain.      Not Delegated - Analgesics:  Opioid Agonist Combinations Failed - 06/21/2021  8:45 AM      Failed - This refill cannot be delegated      Failed - Urine Drug Screen completed in last 360 days      Passed - Valid encounter within last 6 months    Recent Outpatient Visits           2 months ago Type 2 diabetes mellitus with microalbuminuria, without long-term current use of insulin ([place])   [facility] [name], MD   5 months ago Type 2 diabetes mellitus with microalbuminuria, without long-term current use of insulin [facility])   [facility] [name], MD   10 months ago Type 2 diabetes mellitus with microalbuminuria, without long-term current use of insulin [facility])   [facility] [name], MD   1 year ago Iron deficiency anemia due to chronic blood loss   [facility] [name], MD   1 year ago Type 2 diabetes mellitus with microalbuminuria, without long-term current use of insulin [facility])   [facility] [name], MD       Future Appointments             In 2 months Fisher, Kirstie Peri, MD [facility], [place]   In 4 months Fisher, Kirstie Peri, MD [facility], [place]

## 2021-06-21 NOTE — Telephone Encounter (Signed)
Medication: oxyCODONE-acetaminophen (PERCOCET) 10-325 MG tablet  Has the pt contacted their pharmacy? no Preferred pharmacy:  [facility] [address] ([address]), [place] [address]  Please be advised refills may take up to 3 business days.  We ask that you follow up with your pharmacy.

## 2021-06-22 NOTE — Telephone Encounter (Signed)
Pt is aware refill can take up to 3 business day 

## 2021-07-09 ENCOUNTER — Other Ambulatory Visit: Payer: Self-pay | Admitting: Family Medicine

## 2021-07-09 DIAGNOSIS — M5416 Radiculopathy, lumbar region: Secondary | ICD-10-CM

## 2021-07-09 DIAGNOSIS — E1129 Type 2 diabetes mellitus with other diabetic kidney complication: Secondary | ICD-10-CM

## 2021-07-09 NOTE — Telephone Encounter (Signed)
last RF 06/06/21 #28 4 RF

## 2021-07-13 MED ORDER — EMPAGLIFLOZIN 25 MG PO TABS: 25.0000 mg | ORAL_TABLET | Freq: Every day | ORAL | 1 refills | Status: DC

## 2021-07-13 NOTE — Telephone Encounter (Incomplete Revision)
Medication: empagliflozin (JARDIANCE) 25 MG TABS tablet Has the pt contacted their pharmacy? Yes  Preferred pharmacy: [facility] [id number] - [name], [place]  (Pt does not want [facility])  Pt has been out of medication for 4 days. The refills were not sent as indicated in chart.  It says last RF 06/06/21 #28 4RF  But it also says SAMPLE and the pharmacy never received this Rx.

## 2021-07-13 NOTE — Telephone Encounter (Addendum)
Medication: empagliflozin (JARDIANCE) 25 MG TABS tablet Has the pt contacted their pharmacy? Yes  Preferred pharmacy: [name] - [name], [name]  (Pt does not want [facility])  Pt has been out of medication for 4 days. The refills were not sent as indicated in chart.  It says last RF 06/06/21 #28 4RF  But it also says SAMPLE and the pharmacy never received this Rx.

## 2021-07-13 NOTE — Telephone Encounter (Signed)
Notes to clinic:  Looks like patient was given samples of this medication and would like to have a script Patient is out of medication    Requested Prescriptions  Pending Prescriptions Disp Refills   empagliflozin (JARDIANCE) 25 MG TABS tablet 28 tablet 4    Sig: Take 1 tablet (25 mg total) by mouth daily before breakfast.     Endocrinology:  Diabetes - SGLT2 Inhibitors Failed - 07/13/2021 11:31 AM      Failed - AA eGFR in normal range and within 360 days    GFR calc Af Amer  Date Value Ref Range Status  05/08/2020 >60 >60 mL/min Final   GFR calc non Af Amer  Date Value Ref Range Status  05/08/2020 >60 >60 mL/min Final   eGFR  Date Value Ref Range Status  04/19/2021 93 >59 mL/min/1.73 Final          Passed - Cr in normal range and within 360 days    Creatinine, Ser  Date Value Ref Range Status  04/19/2021 0.89 0.76 - 1.27 mg/dL Final   Creatinine, POC  Date Value Ref Range Status  11/06/2017 n/a mg/dL Final          Passed - LDL in normal range and within 360 days    LDL Chol Calc (NIH)  Date Value Ref Range Status  04/19/2021 62 0 - 99 mg/dL Final          Passed - HBA1C is between 0 and 7.9 and within 180 days    Hemoglobin A1C  Date Value Ref Range Status  04/18/2021 5.8 (A) 4.0 - 5.6 % Final   Hgb A1c MFr Bld  Date Value Ref Range Status  04/09/2020 7.0 (H) 4.8 - 5.6 % Final    Comment:             Prediabetes: 5.7 - 6.4          Diabetes: >6.4          Glycemic control for adults with diabetes: <7.0           Passed - Valid encounter within last 6 months    Recent Outpatient Visits           2 months ago Type 2 diabetes mellitus with microalbuminuria, without long-term current use of insulin ([place])   [facility] [name], MD   6 months ago Type 2 diabetes mellitus with microalbuminuria, without long-term current use of insulin ([place])   [facility] [name], MD   10 months ago Type 2 diabetes mellitus  with microalbuminuria, without long-term current use of insulin ([place])   [facility] [name], MD   1 year ago Iron deficiency anemia due to chronic blood loss   [facility] [name], MD   1 year ago Type 2 diabetes mellitus with microalbuminuria, without long-term current use of insulin [facility])   [facility] [name], MD       Future Appointments             In 1 month Fisher, Kirstie Peri, MD [facility], [place]   In 3 months Fisher, Kirstie Peri, MD [facility], [facility]            Refused Prescriptions Disp Refills   JARDIANCE 25 MG TABS tablet [Pharmacy Med Name: Jardiance 25 MG Oral Tablet] 30 tablet 0    Sig: TAKE 1 TABLET BY MOUTH ONCE DAILY BEFORE BREAKFAST  Endocrinology:  Diabetes - SGLT2 Inhibitors Failed - 07/13/2021 11:31 AM      Failed - AA eGFR in normal range and within 360 days    GFR calc Af Amer  Date Value Ref Range Status  05/08/2020 >60 >60 mL/min Final   GFR calc non Af Amer  Date Value Ref Range Status  05/08/2020 >60 >60 mL/min Final   eGFR  Date Value Ref Range Status  04/19/2021 93 >59 mL/min/1.73 Final          Passed - Cr in normal range and within 360 days    Creatinine, Ser  Date Value Ref Range Status  04/19/2021 0.89 0.76 - 1.27 mg/dL Final   Creatinine, POC  Date Value Ref Range Status  11/06/2017 n/a mg/dL Final          Passed - LDL in normal range and within 360 days    LDL Chol Calc (NIH)  Date Value Ref Range Status  04/19/2021 62 0 - 99 mg/dL Final          Passed - HBA1C is between 0 and 7.9 and within 180 days    Hemoglobin A1C  Date Value Ref Range Status  04/18/2021 5.8 (A) 4.0 - 5.6 % Final   Hgb A1c MFr Bld  Date Value Ref Range Status  04/09/2020 7.0 (H) 4.8 - 5.6 % Final    Comment:             Prediabetes: 5.7 - 6.4          Diabetes: >6.4          Glycemic control for adults with diabetes: <7.0           Passed  - Valid encounter within last 6 months    Recent Outpatient Visits           2 months ago Type 2 diabetes mellitus with microalbuminuria, without long-term current use of insulin ([place])   [facility] [name], MD   6 months ago Type 2 diabetes mellitus with microalbuminuria, without long-term current use of insulin ([place])   [facility] [name], MD   10 months ago Type 2 diabetes mellitus with microalbuminuria, without long-term current use of insulin [facility])   [facility] [name], MD   1 year ago Iron deficiency anemia due to chronic blood loss   [facility] [name], MD   1 year ago Type 2 diabetes mellitus with microalbuminuria, without long-term current use of insulin [facility])   [facility] [name], MD       Future Appointments             In 1 month Fisher, Kirstie Peri, MD [facility], [place]   In 3 months Fisher, Kirstie Peri, MD [facility], [place]

## 2021-07-22 ENCOUNTER — Other Ambulatory Visit: Payer: Self-pay | Admitting: Family Medicine

## 2021-07-22 DIAGNOSIS — M5416 Radiculopathy, lumbar region: Secondary | ICD-10-CM

## 2021-07-22 NOTE — Telephone Encounter (Unsigned)
Copied from [place] [id number]. Topic: Quick Communication - Rx Refill/Question >> Jul 22, 2021  9:05 AM Rayann Heman wrote: Medication: oxyCODONE-acetaminophen (PERCOCET) 10-325 MG tablet [id number]    Preferred Pharmacy (with phone number or street name):[facility] [place] - [address]  Agent: Please be advised that RX refills may take up to 3 business days. We ask that you follow-up with your pharmacy.

## 2021-07-22 NOTE — Telephone Encounter (Signed)
Please see 07/22/21 telephone encounter.

## 2021-07-22 NOTE — Telephone Encounter (Signed)
Rayann Heman routed conversation to [employer] Nurse Triage Pool 1 hour ago (9:07 AM)   Andria Frames L 1 hour ago (9:06 AM)   Lurline Hare Copied from [employer] [phone]. Topic: Quick Communication - Rx Refill/Question >> Jul 22, 2021  9:05 AM Rayann Heman wrote: Medication: oxyCODONE-acetaminophen (PERCOCET) 10-325 MG tablet [id number]       Preferred Pharmacy (with phone number or street name):[facility] [place] - [address]   Agent: Please be advised that RX refills may take up to 3 business days. We ask that you follow-up with your pharmacy.      Unsigned

## 2021-07-22 NOTE — Telephone Encounter (Signed)
Please review for refill. Refill not delegated per protocol.  LOV: 04/18/21 Last refill: 06/21/21 #30

## 2021-07-22 NOTE — Telephone Encounter (Signed)
Pt called to check refill status for Oxycodone/ please advise pt once this has been sent tot he pharmacy

## 2021-07-24 ENCOUNTER — Inpatient Hospital Stay
Admission: EM | Admit: 2021-07-24 | Discharge: 2021-07-28 | DRG: 394 | Disposition: A | Discharge status: Home / Self Care | Payer: Medicare Other | Attending: Student | Admitting: Student

## 2021-07-24 ENCOUNTER — Observation Stay: Payer: Medicare Other

## 2021-07-24 ENCOUNTER — Other Ambulatory Visit: Payer: Self-pay

## 2021-07-24 DIAGNOSIS — Z86010 Personal history of colonic polyps: Secondary | ICD-10-CM | POA: Diagnosis not present

## 2021-07-24 DIAGNOSIS — Z951 Presence of aortocoronary bypass graft: Secondary | ICD-10-CM | POA: Diagnosis not present

## 2021-07-24 DIAGNOSIS — K5730 Diverticulosis of large intestine without perforation or abscess without bleeding: Secondary | ICD-10-CM | POA: Diagnosis present

## 2021-07-24 DIAGNOSIS — Z955 Presence of coronary angioplasty implant and graft: Secondary | ICD-10-CM | POA: Diagnosis not present

## 2021-07-24 DIAGNOSIS — N529 Male erectile dysfunction, unspecified: Secondary | ICD-10-CM | POA: Diagnosis present

## 2021-07-24 DIAGNOSIS — K219 Gastro-esophageal reflux disease without esophagitis: Secondary | ICD-10-CM | POA: Diagnosis not present

## 2021-07-24 DIAGNOSIS — R519 Headache, unspecified: Secondary | ICD-10-CM | POA: Diagnosis not present

## 2021-07-24 DIAGNOSIS — R809 Proteinuria, unspecified: Secondary | ICD-10-CM | POA: Diagnosis not present

## 2021-07-24 DIAGNOSIS — Z7984 Long term (current) use of oral hypoglycemic drugs: Secondary | ICD-10-CM | POA: Diagnosis not present

## 2021-07-24 DIAGNOSIS — Z87891 Personal history of nicotine dependence: Secondary | ICD-10-CM | POA: Diagnosis not present

## 2021-07-24 DIAGNOSIS — Z79899 Other long term (current) drug therapy: Secondary | ICD-10-CM

## 2021-07-24 DIAGNOSIS — R42 Dizziness and giddiness: Secondary | ICD-10-CM

## 2021-07-24 DIAGNOSIS — N400 Enlarged prostate without lower urinary tract symptoms: Secondary | ICD-10-CM | POA: Diagnosis present

## 2021-07-24 DIAGNOSIS — E1129 Type 2 diabetes mellitus with other diabetic kidney complication: Secondary | ICD-10-CM | POA: Diagnosis not present

## 2021-07-24 DIAGNOSIS — J9811 Atelectasis: Secondary | ICD-10-CM | POA: Diagnosis not present

## 2021-07-24 DIAGNOSIS — R0602 Shortness of breath: Secondary | ICD-10-CM

## 2021-07-24 DIAGNOSIS — K2970 Gastritis, unspecified, without bleeding: Secondary | ICD-10-CM | POA: Diagnosis present

## 2021-07-24 DIAGNOSIS — J986 Disorders of diaphragm: Secondary | ICD-10-CM | POA: Diagnosis not present

## 2021-07-24 DIAGNOSIS — D62 Acute posthemorrhagic anemia: Secondary | ICD-10-CM | POA: Diagnosis present

## 2021-07-24 DIAGNOSIS — Z20822 Contact with and (suspected) exposure to covid-19: Secondary | ICD-10-CM | POA: Diagnosis not present

## 2021-07-24 DIAGNOSIS — I252 Old myocardial infarction: Secondary | ICD-10-CM

## 2021-07-24 DIAGNOSIS — E782 Mixed hyperlipidemia: Secondary | ICD-10-CM | POA: Diagnosis not present

## 2021-07-24 DIAGNOSIS — E785 Hyperlipidemia, unspecified: Secondary | ICD-10-CM | POA: Diagnosis not present

## 2021-07-24 DIAGNOSIS — D649 Anemia, unspecified: Secondary | ICD-10-CM

## 2021-07-24 DIAGNOSIS — Z8719 Personal history of other diseases of the digestive system: Secondary | ICD-10-CM | POA: Diagnosis present

## 2021-07-24 DIAGNOSIS — I10 Essential (primary) hypertension: Secondary | ICD-10-CM

## 2021-07-24 DIAGNOSIS — K642 Third degree hemorrhoids: Principal | ICD-10-CM | POA: Diagnosis present

## 2021-07-24 DIAGNOSIS — K922 Gastrointestinal hemorrhage, unspecified: Secondary | ICD-10-CM

## 2021-07-24 DIAGNOSIS — Z8711 Personal history of peptic ulcer disease: Secondary | ICD-10-CM

## 2021-07-24 DIAGNOSIS — Z8249 Family history of ischemic heart disease and other diseases of the circulatory system: Secondary | ICD-10-CM

## 2021-07-24 DIAGNOSIS — D509 Iron deficiency anemia, unspecified: Secondary | ICD-10-CM | POA: Diagnosis present

## 2021-07-24 DIAGNOSIS — I2510 Atherosclerotic heart disease of native coronary artery without angina pectoris: Secondary | ICD-10-CM | POA: Diagnosis present

## 2021-07-24 DIAGNOSIS — I25118 Atherosclerotic heart disease of native coronary artery with other forms of angina pectoris: Secondary | ICD-10-CM

## 2021-07-24 DIAGNOSIS — Z7982 Long term (current) use of aspirin: Secondary | ICD-10-CM | POA: Diagnosis not present

## 2021-07-24 DIAGNOSIS — E119 Type 2 diabetes mellitus without complications: Secondary | ICD-10-CM | POA: Diagnosis present

## 2021-07-24 LAB — IRON AND TIBC
Iron: 280 ug/dL — ABNORMAL HIGH (ref 45–182)
Saturation Ratios: 58 % — ABNORMAL HIGH (ref 17.9–39.5)
TIBC: 482 ug/dL — ABNORMAL HIGH (ref 250–450)
UIBC: 202 ug/dL

## 2021-07-24 LAB — CBC
HCT: 23.9 % — ABNORMAL LOW (ref 39.0–52.0)
Hemoglobin: 6.9 g/dL — ABNORMAL LOW (ref 13.0–17.0)
MCH: 26.6 pg (ref 26.0–34.0)
MCHC: 28.9 g/dL — ABNORMAL LOW (ref 30.0–36.0)
MCV: 92.3 fL (ref 80.0–100.0)
Platelets: 235 10*3/uL (ref 150–400)
RBC: 2.59 MIL/uL — ABNORMAL LOW (ref 4.22–5.81)
RDW: 16 % — ABNORMAL HIGH (ref 11.5–15.5)
WBC: 4.7 10*3/uL (ref 4.0–10.5)
nRBC: 0 % (ref 0.0–0.2)

## 2021-07-24 LAB — BASIC METABOLIC PANEL
Anion gap: 5 (ref 5–15)
BUN: 10 mg/dL (ref 8–23)
CO2: 25 mmol/L (ref 22–32)
Calcium: 9 mg/dL (ref 8.9–10.3)
Chloride: 107 mmol/L (ref 98–111)
Creatinine, Ser: 0.88 mg/dL (ref 0.61–1.24)
GFR, Estimated: >60 mL/min (ref >60)
Glucose, Bld: 109 mg/dL — ABNORMAL HIGH (ref 70–99)
Potassium: 3.7 mmol/L (ref 3.5–5.1)
Sodium: 137 mmol/L (ref 135–145)

## 2021-07-24 LAB — URINALYSIS, COMPLETE (UACMP) WITH MICROSCOPIC
Bilirubin Urine: NEGATIVE
Glucose, UA: >=500 mg/dL — AB
Hgb urine dipstick: NEGATIVE
Ketones, ur: NEGATIVE mg/dL
Leukocytes,Ua: NEGATIVE
Nitrite: NEGATIVE
Protein, ur: NEGATIVE mg/dL
Specific Gravity, Urine: 1.025 (ref 1.005–1.030)
Squamous Epithelial / HPF: NONE SEEN (ref 0–5)
pH: 5 (ref 5.0–8.0)

## 2021-07-24 LAB — FERRITIN: Ferritin: 11 ng/mL — ABNORMAL LOW (ref 24–336)

## 2021-07-24 LAB — RESP PANEL BY RT-PCR (FLU A&B, COVID) ARPGX2
Influenza A by PCR: NEGATIVE
Influenza B by PCR: NEGATIVE
SARS Coronavirus 2 by RT PCR: NEGATIVE

## 2021-07-24 LAB — HEMOGLOBIN AND HEMATOCRIT, BLOOD
HCT: 26.8 % — ABNORMAL LOW (ref 39.0–52.0)
Hemoglobin: 8 g/dL — ABNORMAL LOW (ref 13.0–17.0)

## 2021-07-24 LAB — GLUCOSE, CAPILLARY: Glucose-Capillary: 165 mg/dL — ABNORMAL HIGH (ref 70–99)

## 2021-07-24 LAB — PREPARE RBC (CROSSMATCH)

## 2021-07-24 MED ORDER — SODIUM CHLORIDE 0.9 % IV SOLN
10.0000 mL/h | Freq: Once | INTRAVENOUS | Status: AC
  Administered 2021-07-24: 10 mL/h via INTRAVENOUS

## 2021-07-24 MED ORDER — ONDANSETRON HCL 4 MG PO TABS
4.0000 mg | ORAL_TABLET | Freq: Four times a day (QID) | ORAL | Status: DC | PRN
Start: 2021-07-24 — End: 2021-07-28

## 2021-07-24 MED ORDER — ACETAMINOPHEN 325 MG PO TABS: 650.0000 mg | ORAL_TABLET | Freq: Four times a day (QID) | ORAL | Status: AC | PRN

## 2021-07-24 MED ORDER — OXYCODONE-ACETAMINOPHEN 10-325 MG PO TABS: 1.0000 | ORAL_TABLET | Freq: Three times a day (TID) | ORAL | 0 refills | Status: DC | PRN

## 2021-07-24 MED ORDER — ONDANSETRON HCL 4 MG/2ML IJ SOLN
4.0000 mg | Freq: Four times a day (QID) | INTRAMUSCULAR | Status: DC | PRN
Start: 2021-07-24 — End: 2021-07-28

## 2021-07-24 MED ORDER — ACETAMINOPHEN 650 MG RE SUPP: 650.0000 mg | Freq: Four times a day (QID) | RECTAL | Status: AC | PRN

## 2021-07-24 MED ORDER — PANTOPRAZOLE SODIUM 40 MG IV SOLR
40.0000 mg | Freq: Once | INTRAVENOUS | Status: AC
  Administered 2021-07-24: 40 mg via INTRAVENOUS

## 2021-07-24 MED ORDER — PANTOPRAZOLE 80MG IVPB - SIMPLE MED
80.0000 mg | Freq: Two times a day (BID) | INTRAVENOUS | Status: AC
  Administered 2021-07-24 – 2021-07-26 (×4): 80 mg via INTRAVENOUS
  Filled 2021-07-24: qty 80
  Filled 2021-07-24: qty 100
  Filled 2021-07-24 (×2): qty 80
  Filled 2021-07-24 (×2): qty 100

## 2021-07-24 MED ORDER — ROSUVASTATIN CALCIUM 10 MG PO TABS
20.0000 mg | ORAL_TABLET | Freq: Every day | ORAL | Status: DC
  Administered 2021-07-24 – 2021-07-27 (×4): 20 mg via ORAL
  Filled 2021-07-24 (×2): qty 2
  Filled 2021-07-24: qty 1
  Filled 2021-07-24 (×2): qty 2

## 2021-07-24 NOTE — ED Provider Notes (Signed)
[facility] Emergency Department Provider Note  ____________________________________________   Event Date/Time   First MD Initiated Contact with Patient 07/24/21 1402     (approximate)  I have reviewed the triage vital signs and the nursing notes.   HISTORY  Chief Complaint Dizziness    HPI Craig York is a 70 y.o. male with hypertension, hyperlipidemia, coronary disease status post CABG who comes in for dizziness.  Patient reports some intermittent dizziness.  He states that he is had blood in his stool for the past 3 weeks.  This is intermittent, nothing makes it better or worse.  He does report that it is bright red in nature.  He reports that this happened previously.  On review of records patient had an admission back in May 2021 with a hemoglobin of 3.9 that required 4 units of blood and had reassuring except for noticeable internal hemorrhoids but did have some gastritis noted.  He denies the stool being black or tarry.  Patient does have a history of low iron levels and they had been putting him on iron which she states that he has been compliant with and has been taking it twice a day.          Past Medical History:  Diagnosis Date   Anginal pain ([place])    Benign neoplasm of descending colon    BPH (benign prostatic hyperplasia)    Coronary artery disease    Diabetes mellitus without complication ([place])    Type II   Hemorrhage of gastrointestinal tract 07/23/2008   Hyperlipidemia    Hypertension    Iron deficiency anemia    Myocardial infarction [facility])    Myocardial infarction involving left anterior descending (LAD) coronary artery ([place]) 12/25/2019   Polyp of sigmoid colon    Symptomatic anemia 04/12/2020    Patient Active Problem List   Diagnosis Date Noted   Paralysis, diaphragm 05/06/2020   Personal history of COVID-19 11/07/2019   S/P CABG x 4 08/12/2019   History of adenomatous polyp of colon 07/05/2019   Gastric polyp    Iron  (Fe) deficiency anemia 04/19/2017   Fracture of metatarsal bone, right sequela 06/04/2015   H/O acute myocardial infarction 06/04/2015   Lumbar radiculopathy 06/04/2015   Sciatica 02/22/2009   Diabetes mellitus, type 2 ([place]) 01/07/2009   Lumbosacral spondylosis 12/06/2008   ED (erectile dysfunction) of organic origin 10/18/2008   Coronary artery disease 10/16/2008   HLD (hyperlipidemia) 07/23/2008   Essential (primary) hypertension 07/23/2008    Past Surgical History:  Procedure Laterality Date   CARDIAC CATHETERIZATION     chest surgery for fungal infection     COLONOSCOPY WITH PROPOFOL N/A 07/10/2017   Procedure: COLONOSCOPY WITH PROPOFOL;  Surgeon: [name], MD;  Location: [facility] END[facility]COPY;  Service: Endoscopy;  Laterality: N/A;   COLONOSCOPY WITH PROPOFOL N/A 04/13/2020   Procedure: COLONOSCOPY WITH PROPOFOL;  Surgeon: [name], MD;  [facility];  Service: Endoscopy;  Laterality: N/A;   CORONARY ARTERY BYPASS GRAFT N/A 08/12/2019   Procedure: CORONARY ARTERY BYPASS GRAFTING (CABG)X 4;  Surgeon: [name], MD;  Location: [facility];  Service: Open Heart Surgery;  Laterality: N/A;  CABG x  4  using bilateral internal mammary arteries and endoscopically harvested left saphenous vein   ESOPHAGOGASTRODUODENOSCOPY (EGD) WITH PROPOFOL N/A 07/10/2017   Procedure: ESOPHAGOGASTRODUODENOSCOPY (EGD) WITH PROPOFOL;  Surgeon: [name], MD;  Location: [facility] ENDOSCOPY;  Service: Endoscopy;  Laterality: N/A;   ESOPHAGOGASTRODUODENOSCOPY (EGD) WITH PROPOFOL N/A 04/13/2020  Procedure: ESOPHAGOGASTRODUODENOSCOPY (EGD) WITH PROPOFOL;  Surgeon: [name], MD;  [facility] END[facility]COPY;  Service: Endoscopy;  Laterality: N/A;   GIVENS CAPSULE STUDY N/A 04/14/2020   Procedure: GIVENS CAPSULE STUDY;  Surgeon: Jonathon Bellows, MD;  Location: [facility] ENDOSCOPY;  Service: Gastroenterology;  Laterality: N/A;   HERNIA REPAIR     INTERCOSTAL NERVE BLOCK Right 05/06/2020   Procedure: INTERCOSTAL  NERVE BLOCK;  Surgeon: Lajuana Matte, MD;  Location: [place];  Service: Thoracic;  Laterality: Right;   LEFT HEART CATH AND CORONARY ANGIOGRAPHY Left 07/21/2019   Procedure: LEFT HEART CATH AND CORONARY ANGIOGRAPHY;  Surgeon: [name], MD;  Location: [facility] CV LAB;  Service: Cardiovascular;  Laterality: Left;   TEE WITHOUT CARDIOVERSION N/A 08/12/2019   Procedure: TRANSESOPHAGEAL ECHOCARDIOGRAM (TEE);  Surgeon: [name], MD;  Location: [place];  Service: Open Heart Surgery;  Laterality: N/A;    Prior to Admission medications   Medication Sig Start Date End Date Taking? Authorizing Provider  acetaminophen (TYLENOL) 500 MG tablet Take 1,000 mg by mouth every 6 (six) hours as needed for moderate pain.    [provider]  amLODipine (NORVASC) 10 MG tablet Take 1 tablet (10 mg total) by mouth daily. 02/04/21   [name], MD  aspirin EC 81 MG tablet Take 81 mg by mouth daily.    [provider]  empagliflozin (JARDIANCE) 25 MG TABS tablet Take 1 tablet (25 mg total) by mouth daily before breakfast. 07/13/21   [name], MD  ferrous sulfate 325 (65 FE) MG tablet Take 1 tablet (325 mg total) by mouth 2 (two) times daily with a meal. 04/14/20   Sharen Hones, MD  metFORMIN (GLUCOPHAGE) 500 MG tablet Take 2 tablets (1,000 mg total) by mouth daily. 12/27/20   [name], MD  metoprolol succinate (TOPROL-XL) 25 MG 24 hr tablet Take 1 tablet (25 mg total) by mouth daily. 12/01/20   [name], MD  oxyCODONE-acetaminophen (PERCOCET) 10-325 MG tablet Take 1 tablet by mouth every 8 (eight) hours as needed for pain. 06/21/21   [name], MD  pantoprazole (PROTONIX) 40 MG tablet Take 1 tablet by mouth once daily 04/30/21   [name], MD  rosuvastatin (CRESTOR) 20 MG tablet TAKE 1 TABLET BY MOUTH  DAILY 12/09/20   [name], MD  sildenafil (VIAGRA) 100 MG tablet Take 0.5-1 tablets (50-100 mg total) by mouth daily as needed for erectile  dysfunction. 08/26/19   [name], MD  SYSTANE COMPLETE 0.6 % SOLN Place 1 drop into both eyes 3 (three) times daily as needed (dry/irritated eyes.).    [provider]  tadalafil (CIALIS) 20 MG tablet Take 1 tablet (20 mg total) by mouth daily as needed for erectile dysfunction. 04/18/21   [name], MD    Allergies Simvastatin and Penicillins  Family History  Problem Relation Age of Onset   Hypertension Mother     Social History Social History   Tobacco Use   Smoking status: Former   Smokeless tobacco: Former    Types: Chew    Quit date: 11/27/1968   Tobacco comments:    used 2 packs per week; quit over 40 years ago  Vaping Use   Vaping Use: Never used  Substance Use Topics   Alcohol use: Not Currently    Alcohol/week: 0.0 standard drinks   Drug use: No      Review of Systems Constitutional: No fever/chills, dizzy, lightheaded, fatigue Eyes:  No visual changes. ENT: No sore throat. Cardiovascular: Denies chest pain. Respiratory: Denies shortness of breath. Gastrointestinal: No abdominal pain.  No nausea, no vomiting.  No diarrhea.  No constipation.  Blood in stool Genitourinary: Negative for dysuria. Musculoskeletal: Negative for back pain. Skin: Negative for rash. Neurological: Negative for headaches, focal weakness or numbness. All other ROS negative ____________________________________________   PHYSICAL EXAM:  VITAL SIGNS: ED Triage Vitals  Enc Vitals Group     BP 07/24/21 1249 133/76     Pulse Rate 07/24/21 1249 68     Resp 07/24/21 1249 18     Temp 07/24/21 1249 98.8 F (37.1 C)     Temp Source 07/24/21 1249 Oral     SpO2 07/24/21 1249 100 %     Weight 07/24/21 1247 189 lb 9.5 oz (86 kg)     Height 07/24/21 1247 '6\' 1"'$  (1.854 m)     Head Circumference --      Peak Flow --      Pain Score 07/24/21 1247 0     Pain Loc --      Pain Edu? --      Excl. in [place]? --     Constitutional: Alert and oriented. Well appearing and in no  acute distress. Eyes: Conjunctivae are normal. EOMI. Head: Atraumatic. Nose: No congestion/rhinnorhea. Mouth/Throat: Mucous membranes are moist.   Neck: No stridor. Trachea Midline. FROM Cardiovascular: Normal rate, regular rhythm. Grossly normal heart sounds.  Good peripheral circulation. Respiratory: Normal respiratory effort.  No retractions. Lungs CTAB. Gastrointestinal: Soft and nontender. No distention. No abdominal bruits.  Musculoskeletal: No lower extremity tenderness nor edema.  No joint effusions. Neurologic:  Normal speech and language. No gross focal neurologic deficits are appreciated.  Skin:  Skin is warm, dry and intact. No rash noted. Psychiatric: Mood and affect are normal. Speech and behavior are normal. GU: Hemorrhoids felt, no bright red blood per rectum, no stool in the vault to test  ____________________________________________   LABS (all labs ordered are listed, but only abnormal results are displayed)  Labs Reviewed  BASIC METABOLIC PANEL - Abnormal; Notable for the following components:      Result Value   Glucose, Bld 109 (*)    All other components within normal limits  CBC - Abnormal; Notable for the following components:   RBC 2.59 (*)    Hemoglobin 6.9 (*)    HCT 23.9 (*)    MCHC 28.9 (*)    RDW 16.0 (*)    All other components within normal limits  URINALYSIS, COMPLETE (UACMP) WITH MICROSCOPIC - Abnormal; Notable for the following components:   Color, Urine YELLOW (*)    APPearance CLEAR (*)    Glucose, UA >=500 (*)    Bacteria, UA RARE (*)    All other components within normal limits  RESP PANEL BY RT-PCR (FLU A&B, COVID) ARPGX2  CBG MONITORING, ED  TYPE AND SCREEN  PREPARE RBC (CROSSMATCH)   ____________________________________________   ED ECG REPORT I, Vanessa [place], the attending physician, personally viewed and interpreted this ECG.  Normal sinus rate 74, no ST elevation, T wave version in lead V6, normal  intervals ____________________________________________   PROCEDURES  Procedure(s) performed (including Critical Care):  .1-3 Lead EKG Interpretation  Date/Time: 07/24/2021 2:40 PM Performed by: Vanessa [place], MD Authorized by: Vanessa [place], MD     Interpretation: normal     ECG rate:  70s   ECG rate assessment: normal     Rhythm: sinus  rhythm     Ectopy: none     Conduction: normal     ____________________________________________   INITIAL IMPRESSION / ASSESSMENT AND PLAN / ED COURSE  Craig York was evaluated in Emergency Department on 07/24/2021 for the symptoms described in the history of present illness. He was evaluated in the context of the global COVID-19 pandemic, which necessitated consideration that the patient might be at risk for infection with the SARS-CoV-2 virus that causes COVID-19. Institutional protocols and algorithms that pertain to the evaluation of patients at risk for COVID-19 are in a state of rapid change based on information released by regulatory bodies including the CDC and federal and state organizations. These policies and algorithms were followed during the patient's care in the ED.    Patient is a 70 year old who comes in with dizziness, lightheadedness, fatigue in the setting of concern for GI bleed.  Patient's hemoglobin is 6.9 given his heart history his hemoglobin needs to be above 8.  We will start with 1 unit of blood.  This is most concerning for symptom of GI bleed given his prior history has been compliant with his iron.  We will get repeat iron studies today.  His abdomen is soft and nontender and low suspicion for ischemic colitis.  On examination he is got no blood noted on his rectal but he has no stool to test.  Given patient's prior history of GI bleeds requiring significant amount of blood transfusions will discuss with hospital team for admission for his symptomatic anemia and concern for GI bleed.  We will start patient on Protonix  due to history of gastritis.  We will keep patient the cardiac monitor to evaluate for any decompensation.         ____________________________________________   FINAL CLINICAL IMPRESSION(S) / ED DIAGNOSES   Final diagnoses:  Symptomatic anemia  Gastrointestinal hemorrhage, unspecified gastrointestinal hemorrhage type      MEDICATIONS GIVEN DURING THIS VISIT:  Medications  0.9 %  sodium chloride infusion (has no administration in time range)     ED Discharge Orders     None        Note:  This document was prepared using Dragon voice recognition software and may include unintentional dictation errors.    Vanessa [place], MD 07/24/21 1440

## 2021-07-24 NOTE — ED Triage Notes (Signed)
Pt comes pov with dizziness, low oxygen, headaches. States his stool has blood in it. Ambulatory to triage.

## 2021-07-24 NOTE — H&P (Signed)
History and Physical   Craig York [id number] DOB: Dec 09, 1950 DOA: 07/24/2021  PCP: [name], MD  Outpatient Specialists: Dr. Edd Arbour clinic cardiology Patient coming from: Home  I have personally briefly reviewed patient's old medical records in [place].  Chief Concern: Dizziness and bleeding with bowel movements  HPI: Craig York is a 70 y.o. male with medical history significant for hyperlipidemia, hypertension, CAD status post CABG x4, Noninsulin-dependent diabetes mellitus, erectile dysfunction, presents to the emergency department for chief concerns of dizziness and blood in his stool.  He noticed bright red blood in the toilet for 3-4 days. Yesterday while on the toilet twice, he got dizzy and both bowel movements was all blood. He reports experiencing the same symptoms last year when he needed blood transfusion and had bowel movements with blood.  He denies syncope, lost of consiciousness, changes to diet, dysuria, hematuria, chest pain, shortness of breath, nausea, vomiting, dysphagia, fever, cough, headaches.   He reports recent medications changes with Dr. Clayborn Bigness.  And he attributes the dizziness to these medication changes.  Social history: He denies tobacco, etoh, recreational. He is retired and formerly worked in [employer] information centre manager. He lives at home with his wife.   Vaccination history: He is vaccinated for covid 19, 3 doses of Pfizer.   ROS: Constitutional: no weight change, no fever ENT/Mouth: no sore throat, no rhinorrhea Eyes: no eye pain, no vision changes Cardiovascular: no chest pain, no dyspnea,  no edema, no palpitations Respiratory: no cough, no sputum, no wheezing Gastrointestinal: no nausea, no vomiting, no diarrhea, no constipation Genitourinary: no urinary incontinence, no dysuria, no hematuria Musculoskeletal: no arthralgias, no myalgias Skin: no skin lesions, no pruritus, Neuro: + weakness, no loss of consciousness, no  syncope Psych: no anxiety, no depression, + decrease appetite Heme/Lymph: no bruising, no bleeding  ED Course: Discussed with emergency medicine provider, patient requiring hospitalization for chief concerns of acute on chronic anemia.  Vitals in the emergency department was remarkable for temperature 98.8, respiration of 18, heart rate of 68, blood pressure 133/76, SPO2 100% on room air.  Labs in the emergency department was remarkable for sodium 137, potassium 3.7, chloride 107, bicarb 25, BUN 10, serum creatinine of 0.88, nonfasting glucose 109, WBC 4.7, hemoglobin 6.9, platelets 235.  Assessment/Plan  Principal Problem:   GI bleed Active Problems:   Coronary artery disease   Diabetes mellitus, type 2 (HCC)   ED (erectile dysfunction) of organic origin   H/O acute myocardial infarction   HLD (hyperlipidemia)   Essential (primary) hypertension   Iron (Fe) deficiency anemia   S/P CABG x 4   Paralysis, diaphragm   # Acute on chronic anemia # Iron deficiency anemia in the setting secondary to blood loss # Lower GI bleed - Hemoglobin on presentation was 6.9 - Baseline hemoglobin is 9.6-10.4 from June 2021 to June 2022 - Repeat hemoglobin and hematocrit after blood transfusion at 1900 ordered and hemoglobin was 8 - Goal hemoglobin is 8 - CBC in the a.m.  # Hyperlipidemia-rosuvastatin 20 mg nightly  # History of ulcer bleeding status post clamping in the 1990s - Status post Protonix 40 mg IV per ED provider - Protonix 80 mg every 12 hours ordered by myself  # GERD/gastritis-PPI  # DVT prophylaxis-I have not initiated pharmacologic DVT prophylaxis at this time as patient has acute on chronic anemia from lower GI bleed Chart reviewed.   04/13/2020: Colonoscopy was read as nonbleeding internal hemorrhoids during retroflexion.  The hemorrhoids were  grade 2 space (internal hemorrhoids that prolapse but reduce spontaneously). - Patient had presented on 04/09/2020 with hemoglobin of  3.9/hematocrit 15.3/MCV 64 - Patient received 4 units of packed red blood transfusion at that time and was discharged with a stable hemoglobin of 8  DVT prophylaxis: Active bleeding, TED hose Code Status: Full code Diet: Heart healthy/carb modified Family Communication: No Disposition Plan: Pending clinical course Consults called: None at this time Admission status: MedSurg, observation, telemetry ordered for 24 hours  Past Medical History:  Diagnosis Date   Anginal pain ([place])    Benign neoplasm of descending colon    BPH (benign prostatic hyperplasia)    Coronary artery disease    Diabetes mellitus without complication ([place])    Type II   Hemorrhage of gastrointestinal tract 07/23/2008   Hyperlipidemia    Hypertension    Iron deficiency anemia    Myocardial infarction [facility])    Myocardial infarction involving left anterior descending (LAD) coronary artery ([place]) 12/25/2019   Polyp of sigmoid colon    Symptomatic anemia 04/12/2020   Past Surgical History:  Procedure Laterality Date   CARDIAC CATHETERIZATION     chest surgery for fungal infection     COLONOSCOPY WITH PROPOFOL N/A 07/10/2017   Procedure: COLONOSCOPY WITH PROPOFOL;  Surgeon: [name], MD;  Location: [facility] ENDOSCOPY;  Service: Endoscopy;  Laterality: N/A;   COLONOSCOPY WITH PROPOFOL N/A 04/13/2020   Procedure: COLONOSCOPY WITH PROPOFOL;  Surgeon: [name], MD;  [facility];  Service: Endoscopy;  Laterality: N/A;   CORONARY ARTERY BYPASS GRAFT N/A 08/12/2019   Procedure: CORONARY ARTERY BYPASS GRAFTING (CABG)X 4;  Surgeon: [name], MD;  Location: [place];  Service: Open Heart Surgery;  Laterality: N/A;  CABG x  4  using bilateral internal mammary arteries and endoscopically harvested left saphenous vein   ESOPHAGOGASTRODUODENOSCOPY (EGD) WITH PROPOFOL N/A 07/10/2017   Procedure: ESOPHAGOGASTRODUODENOSCOPY (EGD) WITH PROPOFOL;  Surgeon: [name], MD;  Location: [facility] ENDOSCOPY;  Service:  Endoscopy;  Laterality: N/A;   ESOPHAGOGASTRODUODENOSCOPY (EGD) WITH PROPOFOL N/A 04/13/2020   Procedure: ESOPHAGOGASTRODUODENOSCOPY (EGD) WITH PROPOFOL;  Surgeon: [name], MD;  Location: [facility] ENDOSCOPY;  Service: Endoscopy;  Laterality: N/A;   GIVENS CAPSULE STUDY N/A 04/14/2020   Procedure: GIVENS CAPSULE STUDY;  Surgeon: Jonathon Bellows, MD;  Location: [facility] ENDOSCOPY;  Service: Gastroenterology;  Laterality: N/A;   HERNIA REPAIR     INTERCOSTAL NERVE BLOCK Right 05/06/2020   Procedure: INTERCOSTAL NERVE BLOCK;  Surgeon: Lajuana Matte, MD;  Location: [place];  Service: Thoracic;  Laterality: Right;   LEFT HEART CATH AND CORONARY ANGIOGRAPHY Left 07/21/2019   Procedure: LEFT HEART CATH AND CORONARY ANGIOGRAPHY;  Surgeon: [name], MD;  Location: [place] CV LAB;  Service: Cardiovascular;  Laterality: Left;   TEE WITHOUT CARDIOVERSION N/A 08/12/2019   Procedure: TRANSESOPHAGEAL ECHOCARDIOGRAM (TEE);  Surgeon: [name], MD;  Location: [place];  Service: Open Heart Surgery;  Laterality: N/A;   Social History:  reports that he has quit smoking. He quit smokeless tobacco use about 52 years ago.  His smokeless tobacco use included chew. He reports that he does not currently use alcohol. He reports that he does not use drugs.  Allergies  Allergen Reactions   Simvastatin     Elevated CK   Penicillins Rash    Did it involve swelling of the face/tongue/throat, SOB, or low BP? No Did it involve sudden or severe rash/hives, skin peeling, or any reaction on the inside of your mouth  or nose? No Did you need to seek medical attention at a hospital or doctor's office? No When did it last happen?       If all above answers are "NO", may proceed with cephalosporin use.    Family History  Problem Relation Age of Onset   Hypertension Mother    Family history: Family history reviewed and not pertinent  Prior to Admission medications   Medication Sig Start Date End Date Taking?  Authorizing Provider  acetaminophen (TYLENOL) 500 MG tablet Take 1,000 mg by mouth every 6 (six) hours as needed for moderate pain.    [provider]  amLODipine (NORVASC) 10 MG tablet Take 1 tablet (10 mg total) by mouth daily. 02/04/21   [name], MD  aspirin EC 81 MG tablet Take 81 mg by mouth daily.    [provider]  empagliflozin (JARDIANCE) 25 MG TABS tablet Take 1 tablet (25 mg total) by mouth daily before breakfast. 07/13/21   [name], MD  ferrous sulfate 325 (65 FE) MG tablet Take 1 tablet (325 mg total) by mouth 2 (two) times daily with a meal. 04/14/20   Sharen Hones, MD  metFORMIN (GLUCOPHAGE) 500 MG tablet Take 2 tablets (1,000 mg total) by mouth daily. 12/27/20   [name], MD  metoprolol succinate (TOPROL-XL) 25 MG 24 hr tablet Take 1 tablet (25 mg total) by mouth daily. 12/01/20   [name], MD  oxyCODONE-acetaminophen (PERCOCET) 10-325 MG tablet Take 1 tablet by mouth every 8 (eight) hours as needed for pain. 06/21/21   [name], MD  pantoprazole (PROTONIX) 40 MG tablet Take 1 tablet by mouth once daily 04/30/21   [name], MD  rosuvastatin (CRESTOR) 20 MG tablet TAKE 1 TABLET BY MOUTH  DAILY 12/09/20   [name], MD  sildenafil (VIAGRA) 100 MG tablet Take 0.5-1 tablets (50-100 mg total) by mouth daily as needed for erectile dysfunction. 08/26/19   [name], MD  SYSTANE COMPLETE 0.6 % SOLN Place 1 drop into both eyes 3 (three) times daily as needed (dry/irritated eyes.).    [provider]  tadalafil (CIALIS) 20 MG tablet Take 1 tablet (20 mg total) by mouth daily as needed for erectile dysfunction. 04/18/21   [name], MD   Physical Exam: Vitals:   07/24/21 1249 07/24/21 1357 07/24/21 1454 07/24/21 1750  BP: 133/76 135/70 135/73 140/70  Pulse: 68 70 67 65  Resp: '18 17 17 '$ (!) 21  Temp: 98.8 F (37.1 C)  98.6 F (37 C)   TempSrc: Oral  Oral   SpO2: 100% 98%  100%  Weight:      Height:        Constitutional: appears age-appropriate, NAD, calm, comfortable Eyes: PERRL, lids and conjunctivae normal ENMT: Mucous membranes are moist. Posterior pharynx clear of any exudate or lesions. Age-appropriate dentition. Hearing appropriate Neck: normal, supple, no masses, no thyromegaly Respiratory: clear to auscultation bilaterally, no wheezing, no crackles. Normal respiratory effort. No accessory muscle use.  Cardiovascular: Regular rate and rhythm, no murmurs / rubs / gallops. No extremity edema. 2+ pedal pulses. No carotid bruits.  Abdomen: no tenderness, no masses palpated, no hepatosplenomegaly. Bowel sounds positive.  Musculoskeletal: no clubbing / cyanosis. No joint deformity upper and lower extremities. Good ROM, no contractures, no atrophy. Normal muscle tone.  Skin: no rashes, lesions, ulcers. No induration Neurologic: Sensation intact. Strength 5/5 in all 4.  Psychiatric: Normal judgment and insight. Alert and oriented  x 3. Normal mood.   EKG: independently reviewed, showing sinus rhythm with rate of 74, QTc 419  Chest x-ray on Admission: I personally reviewed and I agree with radiologist reading as below.  DG Chest Port 1 View  Result Date: 07/24/2021 CLINICAL DATA:  Dizziness, low oxygen, headaches. EXAM: PORTABLE CHEST 1 VIEW COMPARISON:  Chest x-ray 06/25/2020, CT chest 04/24/2007 FINDINGS: The heart and mediastinal contours are unchanged. Coronary artery stent. Cardiac surgical changes overlie the mediastinum. Bibasilar atelectasis. No focal consolidation. No pulmonary edema. No pleural effusion. No pneumothorax. No acute osseous abnormality. IMPRESSION: No active disease. Electronically Signed   By: Iven Finn M.D.   On: 07/24/2021 15:39    Labs on Admission: I have personally reviewed following labs  CBC: Recent Labs  Lab 07/24/21 1252  WBC 4.7  HGB 6.9*  HCT 23.9*  MCV 92.3  PLT [id number]   Basic Metabolic Panel: Recent Labs  Lab 07/24/21 1252  NA 137  K 3.7   CL 107  CO2 25  GLUCOSE 109*  BUN 10  CREATININE 0.88  CALCIUM 9.0   GFR: Estimated Creatinine Clearance: 89.5 mL/min (by C-G formula based on SCr of 0.88 mg/dL).  Urine analysis:    Component Value Date/Time   COLORURINE YELLOW (A) 07/24/2021 1400   APPEARANCEUR CLEAR (A) 07/24/2021 1400   LABSPEC 1.025 07/24/2021 1400   PHURINE 5.0 07/24/2021 1400   GLUCOSEU >=500 (A) 07/24/2021 1400   HGBUR NEGATIVE 07/24/2021 1400   BILIRUBINUR NEGATIVE 07/24/2021 1400   BILIRUBINUR negative 04/18/2021 1203   KETONESUR NEGATIVE 07/24/2021 1400   PROTEINUR NEGATIVE 07/24/2021 1400   UROBILINOGEN 0.2 04/18/2021 1203   NITRITE NEGATIVE 07/24/2021 1400   LEUKOCYTESUR NEGATIVE 07/24/2021 1400   Dr. Tobie Poet [facility]  If 7PM-7AM, please contact overnight-coverage provider If 7AM-7PM, please contact day coverage provider [email or web address]  07/24/2021, 7:06 PM

## 2021-07-25 ENCOUNTER — Other Ambulatory Visit: Payer: Self-pay | Admitting: Family Medicine

## 2021-07-25 DIAGNOSIS — Z7982 Long term (current) use of aspirin: Secondary | ICD-10-CM | POA: Diagnosis not present

## 2021-07-25 DIAGNOSIS — E785 Hyperlipidemia, unspecified: Secondary | ICD-10-CM | POA: Diagnosis not present

## 2021-07-25 DIAGNOSIS — R809 Proteinuria, unspecified: Secondary | ICD-10-CM

## 2021-07-25 DIAGNOSIS — K642 Third degree hemorrhoids: Secondary | ICD-10-CM | POA: Diagnosis not present

## 2021-07-25 DIAGNOSIS — K5730 Diverticulosis of large intestine without perforation or abscess without bleeding: Secondary | ICD-10-CM | POA: Diagnosis not present

## 2021-07-25 DIAGNOSIS — Z1211 Encounter for screening for malignant neoplasm of colon: Secondary | ICD-10-CM | POA: Diagnosis not present

## 2021-07-25 DIAGNOSIS — I2510 Atherosclerotic heart disease of native coronary artery without angina pectoris: Secondary | ICD-10-CM | POA: Diagnosis present

## 2021-07-25 DIAGNOSIS — K219 Gastro-esophageal reflux disease without esophagitis: Secondary | ICD-10-CM | POA: Diagnosis present

## 2021-07-25 DIAGNOSIS — Z7984 Long term (current) use of oral hypoglycemic drugs: Secondary | ICD-10-CM | POA: Diagnosis not present

## 2021-07-25 DIAGNOSIS — N400 Enlarged prostate without lower urinary tract symptoms: Secondary | ICD-10-CM | POA: Diagnosis present

## 2021-07-25 DIAGNOSIS — Z951 Presence of aortocoronary bypass graft: Secondary | ICD-10-CM | POA: Diagnosis not present

## 2021-07-25 DIAGNOSIS — Z20822 Contact with and (suspected) exposure to covid-19: Secondary | ICD-10-CM | POA: Diagnosis present

## 2021-07-25 DIAGNOSIS — J986 Disorders of diaphragm: Secondary | ICD-10-CM | POA: Diagnosis present

## 2021-07-25 DIAGNOSIS — K3189 Other diseases of stomach and duodenum: Secondary | ICD-10-CM | POA: Diagnosis not present

## 2021-07-25 DIAGNOSIS — K922 Gastrointestinal hemorrhage, unspecified: Secondary | ICD-10-CM | POA: Diagnosis not present

## 2021-07-25 DIAGNOSIS — E1129 Type 2 diabetes mellitus with other diabetic kidney complication: Secondary | ICD-10-CM

## 2021-07-25 DIAGNOSIS — Z8711 Personal history of peptic ulcer disease: Secondary | ICD-10-CM | POA: Diagnosis not present

## 2021-07-25 DIAGNOSIS — Z955 Presence of coronary angioplasty implant and graft: Secondary | ICD-10-CM | POA: Diagnosis not present

## 2021-07-25 DIAGNOSIS — K2970 Gastritis, unspecified, without bleeding: Secondary | ICD-10-CM | POA: Diagnosis not present

## 2021-07-25 DIAGNOSIS — Z79899 Other long term (current) drug therapy: Secondary | ICD-10-CM | POA: Diagnosis not present

## 2021-07-25 DIAGNOSIS — D131 Benign neoplasm of stomach: Secondary | ICD-10-CM | POA: Diagnosis not present

## 2021-07-25 DIAGNOSIS — Z8249 Family history of ischemic heart disease and other diseases of the circulatory system: Secondary | ICD-10-CM | POA: Diagnosis not present

## 2021-07-25 DIAGNOSIS — R0602 Shortness of breath: Secondary | ICD-10-CM | POA: Diagnosis present

## 2021-07-25 DIAGNOSIS — I252 Old myocardial infarction: Secondary | ICD-10-CM | POA: Diagnosis not present

## 2021-07-25 DIAGNOSIS — E119 Type 2 diabetes mellitus without complications: Secondary | ICD-10-CM | POA: Diagnosis present

## 2021-07-25 DIAGNOSIS — Z87891 Personal history of nicotine dependence: Secondary | ICD-10-CM | POA: Diagnosis not present

## 2021-07-25 DIAGNOSIS — D62 Acute posthemorrhagic anemia: Secondary | ICD-10-CM | POA: Diagnosis present

## 2021-07-25 DIAGNOSIS — M5416 Radiculopathy, lumbar region: Secondary | ICD-10-CM

## 2021-07-25 DIAGNOSIS — K649 Unspecified hemorrhoids: Secondary | ICD-10-CM | POA: Diagnosis not present

## 2021-07-25 DIAGNOSIS — Z86010 Personal history of colonic polyps: Secondary | ICD-10-CM | POA: Diagnosis not present

## 2021-07-25 DIAGNOSIS — I10 Essential (primary) hypertension: Secondary | ICD-10-CM | POA: Diagnosis present

## 2021-07-25 DIAGNOSIS — N529 Male erectile dysfunction, unspecified: Secondary | ICD-10-CM | POA: Diagnosis present

## 2021-07-25 LAB — GLUCOSE, CAPILLARY
Glucose-Capillary: 122 mg/dL — ABNORMAL HIGH (ref 70–99)
Glucose-Capillary: 148 mg/dL — ABNORMAL HIGH (ref 70–99)
Glucose-Capillary: 134 mg/dL — ABNORMAL HIGH (ref 70–99)

## 2021-07-25 LAB — HIV ANTIBODY (ROUTINE TESTING W REFLEX): HIV Screen 4th Generation wRfx: NONREACTIVE

## 2021-07-25 LAB — CBC
HCT: 25.2 % — ABNORMAL LOW (ref 39.0–52.0)
Hemoglobin: 7.3 g/dL — ABNORMAL LOW (ref 13.0–17.0)
MCH: 26.2 pg (ref 26.0–34.0)
MCHC: 29 g/dL — ABNORMAL LOW (ref 30.0–36.0)
MCV: 90.3 fL (ref 80.0–100.0)
Platelets: 216 10*3/uL (ref 150–400)
RBC: 2.79 MIL/uL — ABNORMAL LOW (ref 4.22–5.81)
RDW: 15 % (ref 11.5–15.5)
WBC: 4 10*3/uL (ref 4.0–10.5)
nRBC: 0 % (ref 0.0–0.2)

## 2021-07-25 LAB — BASIC METABOLIC PANEL
Anion gap: 6 (ref 5–15)
BUN: 12 mg/dL (ref 8–23)
CO2: 24 mmol/L (ref 22–32)
Calcium: 8.8 mg/dL — ABNORMAL LOW (ref 8.9–10.3)
Chloride: 107 mmol/L (ref 98–111)
Creatinine, Ser: 0.92 mg/dL (ref 0.61–1.24)
GFR, Estimated: >60 mL/min (ref >60)
Glucose, Bld: 114 mg/dL — ABNORMAL HIGH (ref 70–99)
Potassium: 3.9 mmol/L (ref 3.5–5.1)
Sodium: 137 mmol/L (ref 135–145)

## 2021-07-25 LAB — HEMOGLOBIN AND HEMATOCRIT, BLOOD
HCT: 28.5 % — ABNORMAL LOW (ref 39.0–52.0)
Hemoglobin: 8.6 g/dL — ABNORMAL LOW (ref 13.0–17.0)

## 2021-07-25 LAB — PREPARE RBC (CROSSMATCH)

## 2021-07-25 MED ORDER — METFORMIN HCL 500 MG PO TABS: 1000.0000 mg | ORAL_TABLET | Freq: Every day | ORAL | 2 refills | Status: DC

## 2021-07-25 MED ORDER — SODIUM CHLORIDE 0.9% IV SOLUTION: Freq: Once | INTRAVENOUS | Status: AC

## 2021-07-25 MED ORDER — PANTOPRAZOLE SODIUM 40 MG PO TBEC: 40.0000 mg | DELAYED_RELEASE_TABLET | Freq: Every day | ORAL | 0 refills | Status: DC

## 2021-07-25 NOTE — Telephone Encounter (Signed)
Copied from [place] [phone]. Topic: Quick Communication - Rx Refill/Question >> Jul 25, 2021  9:14 AM Tessa Lerner A wrote: Medication: pantoprazole (PROTONIX) 80 mg /NS 100 mL IVPB   [id number]  oxyCODONE-acetaminophen (PERCOCET) 10-325 MG tablet [id number]   metFORMIN (GLUCOPHAGE) 500 MG tablet [id number]   Has the patient contacted their pharmacy? Yes.   (Agent: If no, request that the patient contact the pharmacy for the refill.) (Agent: If yes, when and what did the pharmacy advise?)  Preferred Pharmacy (with phone number or street name): [facility]),  - Day  Phone:  [phone] Fax:  [phone]  Agent: Please be advised that RX refills may take up to 3 business days. We ask that you follow-up with your pharmacy.

## 2021-07-25 NOTE — Telephone Encounter (Signed)
Requested medication (s) are due for refill today: no  Requested medication (s) are on the active medication list: yes  Last refill:  07/24/21   Future visit scheduled: yes  Notes to clinic:  attempted to call pharmacy med went to and no answer both times.  Was trying to call to see if it had been picked up. Pt is in hospital.    Requested Prescriptions  Pending Prescriptions Disp Refills   oxyCODONE-acetaminophen (PERCOCET) 10-325 MG tablet 30 tablet     Sig: Take 1 tablet by mouth every 8 (eight) hours as needed for pain.     Not Delegated - Analgesics:  Opioid Agonist Combinations Failed - 07/25/2021  1:12 PM      Failed - This refill cannot be delegated      Failed - Urine Drug Screen completed in last 360 days      Passed - Valid encounter within last 6 months    Recent Outpatient Visits           3 months ago Type 2 diabetes mellitus with microalbuminuria, without long-term current use of insulin ([place])   [facility] [name], MD   7 months ago Type 2 diabetes mellitus with microalbuminuria, without long-term current use of insulin [facility])   [facility] [name], MD   11 months ago Type 2 diabetes mellitus with microalbuminuria, without long-term current use of insulin ([place])   [facility] [name], MD   1 year ago Iron deficiency anemia due to chronic blood loss   [facility] [name], MD   1 year ago Type 2 diabetes mellitus with microalbuminuria, without long-term current use of insulin [facility])   [facility] [name], MD       Future Appointments             In 4 weeks Caryn Section, Kirstie Peri, MD [facility]   In 3 months Fisher, Kirstie Peri, MD [facility]            Signed Prescriptions Disp Refills   metFORMIN (GLUCOPHAGE) 500 MG tablet 90 tablet 2    Sig: Take 2 tablets (1,000 mg total) by mouth daily.      Endocrinology:  Diabetes - Biguanides Failed - 07/25/2021  1:12 PM      Failed - AA eGFR in normal range and within 360 days    GFR calc Af Amer  Date Value Ref Range Status  05/08/2020 >60 >60 mL/min Final   GFR, Estimated  Date Value Ref Range Status  07/25/2021 >60 >60 mL/min Final    Comment:    (NOTE) Calculated using the CKD-EPI Creatinine Equation (2021)    eGFR  Date Value Ref Range Status  04/19/2021 93 >59 mL/min/1.73 Final          Passed - Cr in normal range and within 360 days    Creatinine, Ser  Date Value Ref Range Status  07/25/2021 0.92 0.61 - 1.24 mg/dL Final   Creatinine, POC  Date Value Ref Range Status  11/06/2017 n/a mg/dL Final          Passed - HBA1C is between 0 and 7.9 and within 180 days    Hemoglobin A1C  Date Value Ref Range Status  04/18/2021 5.8 (A) 4.0 - 5.6 % Final   Hgb A1c MFr Bld  Date Value Ref Range Status  04/09/2020 7.0 (H) 4.8 - 5.6 % Final  Comment:             Prediabetes: 5.7 - 6.4          Diabetes: >6.4          Glycemic control for adults with diabetes: <7.0           Passed - Valid encounter within last 6 months    Recent Outpatient Visits           3 months ago Type 2 diabetes mellitus with microalbuminuria, without long-term current use of insulin ([place])   [facility] [name], MD   7 months ago Type 2 diabetes mellitus with microalbuminuria, without long-term current use of insulin [facility])   [facility] [name], MD   11 months ago Type 2 diabetes mellitus with microalbuminuria, without long-term current use of insulin ([place])   [facility] [name], MD   1 year ago Iron deficiency anemia due to chronic blood loss   [facility] [name], MD   1 year ago Type 2 diabetes mellitus with microalbuminuria, without long-term current use of insulin [facility])   [facility] [name], MD       Future  Appointments             In 4 weeks Caryn Section, Kirstie Peri, MD [facility]   In 3 months Fisher, Kirstie Peri, MD [facility]             pantoprazole (PROTONIX) 40 MG tablet 90 tablet 0    Sig: Take 1 tablet (40 mg total) by mouth daily.     Gastroenterology: Proton Pump Inhibitors Passed - 07/25/2021  1:12 PM      Passed - Valid encounter within last 12 months    Recent Outpatient Visits           3 months ago Type 2 diabetes mellitus with microalbuminuria, without long-term current use of insulin [facility])   [facility] [name], MD   7 months ago Type 2 diabetes mellitus with microalbuminuria, without long-term current use of insulin [facility])   [facility] [name], MD   11 months ago Type 2 diabetes mellitus with microalbuminuria, without long-term current use of insulin [facility])   [facility] [name], MD   1 year ago Iron deficiency anemia due to chronic blood loss   [facility] [name], MD   1 year ago Type 2 diabetes mellitus with microalbuminuria, without long-term current use of insulin [facility])   [facility] [name], MD       Future Appointments             In 4 weeks Fisher, Kirstie Peri, MD [facility] [place]   In 3 months Fisher, Kirstie Peri, MD [facility], [facility]

## 2021-07-25 NOTE — Progress Notes (Signed)
PROGRESS NOTE    Craig York  [id number] DOB: [birth year] DOA: 07/24/2021 PCP: [name], MD    Brief Narrative:  70 y.o. male with medical history significant for hyperlipidemia, hypertension, CAD status post CABG x4, Noninsulin-dependent diabetes mellitus, erectile dysfunction, presents to the emergency department for chief concerns of dizziness and blood in his stool.   He noticed bright red blood in the toilet for 3-4 days. Yesterday while on the toilet twice, he got dizzy and both bowel movements was all blood. He reports experiencing the same symptoms last year when he needed blood transfusion and had bowel movements with blood.  Hemoglobin 6.9 on admission.  Transfused 1 unit and hemoglobin responded to 8, subsequent downtrend to 7.3   Assessment & Plan:   Principal Problem:   GI bleed Active Problems:   Coronary artery disease   Diabetes mellitus, type 2 (HCC)   ED (erectile dysfunction) of organic origin   H/O acute myocardial infarction   HLD (hyperlipidemia)   Essential (primary) hypertension   Iron (Fe) deficiency anemia   S/P CABG x 4   Paralysis, diaphragm  Acute on chronic blood loss anemia Bright red blood per rectum Hemoglobin on presentation 6.9 Baseline 9.6-10.4 Status posttransfusion 1 unit PRBC, hemoglobin 8 Subsequent downtrend to 7.3 Plan: Transfuse additional 1 unit PRBC Request gastroenterology evaluation, consulted Dr. Haig Prophet Continue twice daily Protonix Trend CBC  Hyperlipidemia Crestor 20 mg daily  History of peptic ulcer disease Protonix 80 mg IV every 12 hours  GERD/gastritis PPI   DVT prophylaxis: SCD Code Status: Full Family Communication: None today Disposition Plan: Status is: Inpatient  Remains inpatient appropriate because:Inpatient level of care appropriate due to severity of illness  Dispo: The patient is from: Home              Anticipated d/c is to: Home              Patient currently is not  medically stable to d/c.   Difficult to place patient No             Level of care: Med-Surg  Consultants:  GI  Procedures:  None  Antimicrobials:  None   Subjective: Seen and examined.  Sitting comfortably in bed.  No visible distress.  No pain complaints.  Reports 2 small episodes of BRBPR over interval  Objective: Vitals:   07/24/21 2033 07/25/21 0422 07/25/21 1135 07/25/21 1207  BP:  (!) 124/58 120/68 122/70  Pulse:  71 71 70  Resp:  '18 20 20  '$ Temp:  97.9 F (36.6 C) 97.9 F (36.6 C) 98.5 F (36.9 C)  TempSrc:  Oral  Oral  SpO2:  100% 100% 100%  Weight: 83.3 kg     Height: '6\' 1"'$  (1.854 m)       Intake/Output Summary (Last 24 hours) at 07/25/2021 1232 Last data filed at 07/25/2021 1028 Gross per 24 hour  Intake 545 ml  Output 1500 ml  Net -955 ml   Filed Weights   07/24/21 1247 07/24/21 2033  Weight: 86 kg 83.3 kg    Examination:  General exam: Appears calm and comfortable  Respiratory system: Clear to auscultation. Respiratory effort normal. Cardiovascular system: S1 & S2 heard, RRR. No JVD, murmurs, rubs, gallops or clicks. No pedal edema. Gastrointestinal system: Abdomen is nondistended, soft and nontender. No organomegaly or masses felt. Normal bowel sounds heard. Central nervous system: Alert and oriented. No focal neurological deficits. Extremities: Symmetric 5 x 5 power. Skin: No rashes, lesions  or ulcers Psychiatry: Judgement and insight appear normal. Mood & affect appropriate.     Data Reviewed: I have personally reviewed following labs and imaging studies  CBC: Recent Labs  Lab 07/24/21 1252 07/24/21 2026 07/25/21 0505  WBC 4.7  --  4.0  HGB 6.9* 8.0* 7.3*  HCT 23.9* 26.8* 25.2*  MCV 92.3  --  90.3  PLT 235  --  [id number]   Basic Metabolic Panel: Recent Labs  Lab 07/24/21 1252 07/25/21 0505  NA 137 137  K 3.7 3.9  CL 107 107  CO2 25 24  GLUCOSE 109* 114*  BUN 10 12  CREATININE 0.88 0.92  CALCIUM 9.0 8.8*    GFR: Estimated Creatinine Clearance: 85.6 mL/min (by C-G formula based on SCr of 0.92 mg/dL). Liver Function Tests: No results for input(s): AST, ALT, ALKPHOS, BILITOT, PROT, ALBUMIN in the last 168 hours. No results for input(s): LIPASE, AMYLASE in the last 168 hours. No results for input(s): AMMONIA in the last 168 hours. Coagulation Profile: No results for input(s): INR, PROTIME in the last 168 hours. Cardiac Enzymes: No results for input(s): CKTOTAL, CKMB, CKMBINDEX, TROPONINI in the last 168 hours. BNP (last 3 results) No results for input(s): PROBNP in the last 8760 hours. HbA1C: No results for input(s): HGBA1C in the last 72 hours. CBG: Recent Labs  Lab 07/24/21 2205 07/25/21 0842 07/25/21 1142  GLUCAP 165* 122* 148*   Lipid Profile: No results for input(s): CHOL, HDL, LDLCALC, TRIG, CHOLHDL, LDLDIRECT in the last 72 hours. Thyroid Function Tests: No results for input(s): TSH, T4TOTAL, FREET4, T3FREE, THYROIDAB in the last 72 hours. Anemia Panel: Recent Labs    07/24/21 1252  FERRITIN 11*  TIBC 482*  IRON 280*   Sepsis Labs: No results for input(s): PROCALCITON, LATICACIDVEN in the last 168 hours.  Recent Results (from the past 240 hour(s))  Resp Panel by RT-PCR (Flu A&B, Covid) Nasopharyngeal Swab     Status: None   Collection Time: 07/24/21  2:40 PM   Specimen: Nasopharyngeal Swab; Nasopharyngeal(NP) swabs in vial transport medium  Result Value Ref Range Status   SARS Coronavirus 2 by RT PCR NEGATIVE NEGATIVE Final    Comment: (NOTE) SARS-CoV-2 target nucleic acids are NOT DETECTED.  The SARS-CoV-2 RNA is generally detectable in upper respiratory specimens during the acute phase of infection. The lowest concentration of SARS-CoV-2 viral copies this assay can detect is 138 copies/mL. A negative result does not preclude SARS-Cov-2 infection and should not be used as the sole basis for treatment or other patient management decisions. A negative result may  occur with  improper specimen collection/handling, submission of specimen other than nasopharyngeal swab, presence of viral mutation(s) within the areas targeted by this assay, and inadequate number of viral copies(<138 copies/mL). A negative result must be combined with clinical observations, patient history, and epidemiological information. The expected result is Negative.  Fact Sheet for Patients:  EntrepreneurPulse.com.au  Fact Sheet for Healthcare Providers:  IncredibleEmployment.be  This test is no t yet approved or cleared by the [place] FDA and  has been authorized for detection and/or diagnosis of SARS-CoV-2 by FDA under an Emergency Use Authorization (EUA). This EUA will remain  in effect (meaning this test can be used) for the duration of the COVID-19 declaration under Section 564(b)(1) of the Act, 21 U.S.C.section 360bbb-3(b)(1), unless the authorization is terminated  or revoked sooner.       Influenza A by PCR NEGATIVE NEGATIVE Final   Influenza B by PCR NEGATIVE NEGATIVE  Final    Comment: (NOTE) The Xpert Xpress SARS-CoV-2/FLU/RSV plus assay is intended as an aid in the diagnosis of influenza from Nasopharyngeal swab specimens and should not be used as a sole basis for treatment. Nasal washings and aspirates are unacceptable for Xpert Xpress SARS-CoV-2/FLU/RSV testing.  Fact Sheet for Patients: EntrepreneurPulse.com.au  Fact Sheet for Healthcare Providers: IncredibleEmployment.be  This test is not yet approved or cleared by the [place] FDA and has been authorized for detection and/or diagnosis of SARS-CoV-2 by FDA under an Emergency Use Authorization (EUA). This EUA will remain in effect (meaning this test can be used) for the duration of the COVID-19 declaration under Section 564(b)(1) of the Act, 21 U.S.C. section 360bbb-3(b)(1), unless the authorization is terminated  or revoked.  Performed at [facility], [address], [place], [place] [address]          Radiology Studies: [facility] Chest [place] 1 View  Result Date: 07/24/2021 CLINICAL DATA:  Dizziness, low oxygen, headaches. EXAM: PORTABLE CHEST 1 VIEW COMPARISON:  Chest x-ray 06/25/2020, CT chest 04/24/2007 FINDINGS: The heart and mediastinal contours are unchanged. Coronary artery stent. Cardiac surgical changes overlie the mediastinum. Bibasilar atelectasis. No focal consolidation. No pulmonary edema. No pleural effusion. No pneumothorax. No acute osseous abnormality. IMPRESSION: No active disease. Electronically Signed   By: Iven Finn M.D.   On: 07/24/2021 15:39        Scheduled Meds:  rosuvastatin  20 mg Oral QHS   Continuous Infusions:  pantoprazole (PROTONIX) IV Stopped (07/24/21 2223)     LOS: 0 days    Time spent: 25 minutes    Sidney Ace, MD [facility] Pager [phone]  If 7PM-7AM, please contact night-coverage 07/25/2021, 12:32 PM

## 2021-07-25 NOTE — Telephone Encounter (Signed)
Called patient to clarify medication request for protonix 80 mg/NS 112m IVPB written while in hospital vs. Protonix 40 mg po. Will await call back before sending Rx request. Will send request for metformin 500 mg and oxycodone/ Percocet.

## 2021-07-25 NOTE — Telephone Encounter (Signed)
Attempted to Call [facility] to ask if prescription med had been picked up and no answer. Requested Prescriptions  Pending Prescriptions Disp Refills  . oxyCODONE-acetaminophen (PERCOCET) 10-325 MG tablet 30 tablet     Sig: Take 1 tablet by mouth every 8 (eight) hours as needed for pain.     Not Delegated - Analgesics:  Opioid Agonist Combinations Failed - 07/25/2021  1:12 PM      Failed - This refill cannot be delegated      Failed - Urine Drug Screen completed in last 360 days      Passed - Valid encounter within last 6 months    Recent Outpatient Visits          3 months ago Type 2 diabetes mellitus with microalbuminuria, without long-term current use of insulin ([place])   [facility] [name], MD   7 months ago Type 2 diabetes mellitus with microalbuminuria, without long-term current use of insulin [facility])   [facility] [name], MD   11 months ago Type 2 diabetes mellitus with microalbuminuria, without long-term current use of insulin [facility])   [facility] [name], MD   1 year ago Iron deficiency anemia due to chronic blood loss   [facility] [name], MD   1 year ago Type 2 diabetes mellitus with microalbuminuria, without long-term current use of insulin [facility])   [place], MD      Future Appointments            In 4 weeks Caryn Section, Kirstie Peri, MD [facility], [facility]   In 3 months Fisher, Kirstie Peri, MD [employer], [facility]           . metFORMIN (GLUCOPHAGE) 500 MG tablet 90 tablet 2    Sig: Take 2 tablets (1,000 mg total) by mouth daily.     Endocrinology:  Diabetes - Biguanides Failed - 07/25/2021  1:12 PM      Failed - AA eGFR in normal range and within 360 days    GFR calc Af Amer  Date Value Ref Range Status  05/08/2020 >60 >60 mL/min Final   GFR, Estimated  Date Value Ref Range Status  07/25/2021 >60 >60 mL/min Final    Comment:     (NOTE) Calculated using the CKD-EPI Creatinine Equation (2021)    eGFR  Date Value Ref Range Status  04/19/2021 93 >59 mL/min/1.73 Final         Passed - Cr in normal range and within 360 days    Creatinine, Ser  Date Value Ref Range Status  07/25/2021 0.92 0.61 - 1.24 mg/dL Final   Creatinine, POC  Date Value Ref Range Status  11/06/2017 n/a mg/dL Final         Passed - HBA1C is between 0 and 7.9 and within 180 days    Hemoglobin A1C  Date Value Ref Range Status  04/18/2021 5.8 (A) 4.0 - 5.6 % Final   Hgb A1c MFr Bld  Date Value Ref Range Status  04/09/2020 7.0 (H) 4.8 - 5.6 % Final    Comment:             Prediabetes: 5.7 - 6.4          Diabetes: >6.4          Glycemic control for adults with diabetes: <7.0          Passed - Valid encounter within last 6 months  Recent Outpatient Visits          3 months ago Type 2 diabetes mellitus with microalbuminuria, without long-term current use of insulin [facility])   [facility] [name], MD   7 months ago Type 2 diabetes mellitus with microalbuminuria, without long-term current use of insulin [facility])   [facility] [name], MD   11 months ago Type 2 diabetes mellitus with microalbuminuria, without long-term current use of insulin [facility])   [facility] [name], MD   1 year ago Iron deficiency anemia due to chronic blood loss   [facility] [name], MD   1 year ago Type 2 diabetes mellitus with microalbuminuria, without long-term current use of insulin [facility])   [facility] [name], MD      Future Appointments            In 4 weeks Caryn Section, Kirstie Peri, MD [facility] [place]   In 3 months Fisher, Kirstie Peri, MD [facility], [facility]           . pantoprazole (PROTONIX) 40 MG tablet 90 tablet 0    Sig: Take 1 tablet (40 mg total) by mouth daily.     Gastroenterology: Proton Pump Inhibitors  Passed - 07/25/2021  1:12 PM      Passed - Valid encounter within last 12 months    Recent Outpatient Visits          3 months ago Type 2 diabetes mellitus with microalbuminuria, without long-term current use of insulin [facility])   [facility] [name], MD   7 months ago Type 2 diabetes mellitus with microalbuminuria, without long-term current use of insulin [facility])   [facility] [name], MD   11 months ago Type 2 diabetes mellitus with microalbuminuria, without long-term current use of insulin [facility])   [facility] [name], MD   1 year ago Iron deficiency anemia due to chronic blood loss   [facility] [name], MD   1 year ago Type 2 diabetes mellitus with microalbuminuria, without long-term current use of insulin [facility])   [facility] [name], MD      Future Appointments            In 4 weeks Fisher, Kirstie Peri, MD [facility] [place]   In 3 months Fisher, Kirstie Peri, MD [facility], [facility]

## 2021-07-25 NOTE — Consult Note (Signed)
Consultation  Referring Provider:     Dr Tobie Poet Admit date 07/24/21 Consult date        07/25/21 Reason for Consultation: rectal bleeding              HPI:   Craig York is a 70 y.o. male with medical history significant for hyperlipidemia, hypertension, CAD status post CABG x4, Noninsulin-dependent diabetes mellitus, erectile dysfunction. Admitted yesterday for rectal bleeding, anemia (6.9} and dizziness, received 2u prbcs and is receiving and additional unit now for hbg 7.3. States he was in his usual state of health until yesterday- felt the urge to defecate so went to commode and states that red blood shot out of his rectum- worse than he has ever seen. This happened 3 or 4 times then quit. Denies any abdominal pain, dyspespia, constipation, diarrhea, straining, melena, and all other GI concerns. States he had a tiny bit of brpr today. Denies any chest pain, sob. States he has had some dizziness but attributes this to a recent cardiac medication change. He thinks he might take an asa or a blood thinner but is unsure- note 81 ASA in on his med list. His last cardiology note does not indicate any other anticoagulation (7/22).  States he is currently feeling well. Denies any history of radiation, connective tissue disease, denies any current tobacco/etoh.  PREVIOUS ENDOSCOPIES:            VCE 6/21- small bowel polyp Colonoscopyt 04/13/20- Dr Allen Norris, nonbleeding hemorrhoids EGD 04/13/20- Dr Allen Norris- friable stomach, atrophic gastritis- biopsies demosntrated chronic and focally active gastritis without  any intestinal metaplaisa/dysplasia/malignancy/h pylori. Biopsy of polyp with hyperplastic pyloric type glands, superficial lymphplasmacytic infiltrate; negative for metaplasia/dysplasia/malignancy and negative for h pylori Colonsocopy 07/10/17- Dr Allen Norris- 2 adenomatous polyps removed EGD 07/10/17- Dr Allen Norris- irregular zline, gastric polyp Colonoscopy 2013- Dr Dionne Milo, report not available.  Past Medical  History:  Diagnosis Date   Anginal pain ([place])    Benign neoplasm of descending colon    BPH (benign prostatic hyperplasia)    Coronary artery disease    Diabetes mellitus without complication (HCC)    Type II   Hemorrhage of gastrointestinal tract 07/23/2008   Hyperlipidemia    Hypertension    Iron deficiency anemia    Myocardial infarction [facility])    Myocardial infarction involving left anterior descending (LAD) coronary artery ([place]) 12/25/2019   Polyp of sigmoid colon    Symptomatic anemia 04/12/2020    Past Surgical History:  Procedure Laterality Date   CARDIAC CATHETERIZATION     chest surgery for fungal infection     COLONOSCOPY WITH PROPOFOL N/A 07/10/2017   Procedure: COLONOSCOPY WITH PROPOFOL;  Surgeon: [name], MD;  Location: [facility] ENDOSCOPY;  Service: Endoscopy;  Laterality: N/A;   COLONOSCOPY WITH PROPOFOL N/A 04/13/2020   Procedure: COLONOSCOPY WITH PROPOFOL;  Surgeon: [name], MD;  Location: [facility] ENDOSCOPY;  Service: Endoscopy;  Laterality: N/A;   CORONARY ARTERY BYPASS GRAFT N/A 08/12/2019   Procedure: CORONARY ARTERY BYPASS GRAFTING (CABG)X 4;  Surgeon: [name], MD;  Location: [place];  Service: Open Heart Surgery;  Laterality: N/A;  CABG x  4  using bilateral internal mammary arteries and endoscopically harvested left saphenous vein   ESOPHAGOGASTRODUODENOSCOPY (EGD) WITH PROPOFOL N/A 07/10/2017   Procedure: ESOPHAGOGASTRODUODENOSCOPY (EGD) WITH PROPOFOL;  Surgeon: [name], MD;  Location: [facility] ENDOSCOPY;  Service: Endoscopy;  Laterality: N/A;   ESOPHAGOGASTRODUODENOSCOPY (EGD) WITH PROPOFOL N/A 04/13/2020   Procedure: ESOPHAGOGASTRODUODENOSCOPY (EGD) WITH PROPOFOL;  Surgeon: [name], MD;  Location: [facility] ENDOSCOPY;  Service: Endoscopy;  Laterality: N/A;   GIVENS CAPSULE STUDY N/A 04/14/2020   Procedure: GIVENS CAPSULE STUDY;  Surgeon: Jonathon Bellows, MD;  Location: [facility] ENDOSCOPY;  Service: Gastroenterology;  Laterality: N/A;   HERNIA REPAIR      INTERCOSTAL NERVE BLOCK Right 05/06/2020   Procedure: INTERCOSTAL NERVE BLOCK;  Surgeon: Lajuana Matte, MD;  Location: [place];  Service: Thoracic;  Laterality: Right;   LEFT HEART CATH AND CORONARY ANGIOGRAPHY Left 07/21/2019   Procedure: LEFT HEART CATH AND CORONARY ANGIOGRAPHY;  Surgeon: [name], MD;  Location: [place] CV LAB;  Service: Cardiovascular;  Laterality: Left;   TEE WITHOUT CARDIOVERSION N/A 08/12/2019   Procedure: TRANSESOPHAGEAL ECHOCARDIOGRAM (TEE);  Surgeon: [name], MD;  Location: [place];  Service: Open Heart Surgery;  Laterality: N/A;    Family History  Problem Relation Age of Onset   Hypertension Mother      Social History   Tobacco Use   Smoking status: Former   Smokeless tobacco: Former    Types: Chew    Quit date: 11/27/1968   Tobacco comments:    used 2 packs per week; quit over 40 years ago  Vaping Use   Vaping Use: Never used  Substance Use Topics   Alcohol use: Not Currently    Alcohol/week: 0.0 standard drinks   Drug use: No    Prior to Admission medications   Medication Sig Start Date End Date Taking? Authorizing Provider  acetaminophen (TYLENOL) 500 MG tablet Take 1,000 mg by mouth every 6 (six) hours as needed for moderate pain.   Yes [provider]  amLODipine (NORVASC) 10 MG tablet Take 1 tablet (10 mg total) by mouth daily. 02/04/21  Yes [name], MD  aspirin EC 81 MG tablet Take 81 mg by mouth daily.   Yes [provider]  empagliflozin (JARDIANCE) 25 MG TABS tablet Take 1 tablet (25 mg total) by mouth daily before breakfast. 07/13/21  Yes [name], MD  ferrous sulfate 325 (65 FE) MG tablet Take 1 tablet (325 mg total) by mouth 2 (two) times daily with a meal. 04/14/20  Yes Sharen Hones, MD  losartan (COZAAR) 50 MG tablet Take 50 mg by mouth daily. 06/21/21  Yes [provider]  metFORMIN (GLUCOPHAGE) 500 MG tablet Take 2 tablets (1,000 mg total) by mouth daily. 12/27/20  Yes  [name], MD  metoprolol succinate (TOPROL-XL) 25 MG 24 hr tablet Take 1 tablet (25 mg total) by mouth daily. 12/01/20  Yes [name], MD  pantoprazole (PROTONIX) 40 MG tablet Take 1 tablet by mouth once daily 04/30/21  Yes Fisher, Kirstie Peri, MD  rosuvastatin (CRESTOR) 20 MG tablet TAKE 1 TABLET BY MOUTH  DAILY 12/09/20  Yes [name], MD  oxyCODONE-acetaminophen (PERCOCET) 10-325 MG tablet Take 1 tablet by mouth every 8 (eight) hours as needed for pain. 07/24/21   [name], MD  SYSTANE COMPLETE 0.6 % SOLN Place 1 drop into both eyes 3 (three) times daily as needed (dry/irritated eyes.).    [provider]  tadalafil (CIALIS) 20 MG tablet Take 1 tablet (20 mg total) by mouth daily as needed for erectile dysfunction. 04/18/21   [name], MD    Current Facility-Administered Medications  Medication Dose Route Frequency Provider Last Rate Last Admin   acetaminophen (TYLENOL) tablet 650 mg  650 mg Oral Q6H PRN Cox, Amy N, DO       Or  acetaminophen (TYLENOL) suppository 650 mg  650 mg Rectal Q6H PRN Cox, Amy N, DO       ondansetron (ZOFRAN) tablet 4 mg  4 mg Oral Q6H PRN Cox, Amy N, DO       Or   ondansetron (ZOFRAN) injection 4 mg  4 mg Intravenous Q6H PRN Cox, Amy N, DO       pantoprazole (PROTONIX) 80 mg /NS 100 mL IVPB  80 mg Intravenous Q12H Cox, Amy N, DO   Stopped at 07/24/21 2223   rosuvastatin (CRESTOR) tablet 20 mg  20 mg Oral QHS Cox, Amy N, DO   20 mg at 07/24/21 2146    Allergies as of 07/24/2021 - Review Complete 07/24/2021  Allergen Reaction Noted   Simvastatin  06/04/2015   Penicillins Rash 06/04/2015     Review of Systems:    All systems reviewed and negative except where noted in HPI.    Physical Exam:  Vital signs in last 24 hours: Temp:  [97.9 F (36.6 C)-98.6 F (37 C)] 98.5 F (36.9 C) (08/29 1207) Pulse Rate:  [65-71] 70 (08/29 1207) Resp:  [17-21] 20 (08/29 1207) BP: (120-151)/(58-76) 122/70 (08/29 1207) SpO2:  [100  %] 100 % (08/29 1207) Weight:  [83.3 kg] 83.3 kg (08/28 2033) Last BM Date: 07/24/21 General:   Pleasant man in NAD Head:  Normocephalic and atraumatic. Eyes:   No icterus.   Conjunctiva pink. Ears:  Normal auditory acuity. Mouth: Mucosa pink moist, no lesions. Neck:  Supple; no masses felt Lungs:  Respirations even and unlabored. Lungs clear to auscultation bilaterally.   No wheezes, crackles, or rhonchi.  Heart:  S1S2, RRR, no MRG. No edema. Abdomen:   Flat, soft, nondistended, nontender. Normal bowel sounds. No appreciable masses or hepatomegaly. No rebound signs or other peritoneal signs. Rectal:  digital exam deferred. There are large protruding external hemorrhoids  Msk:  MAEW x4, No clubbing or cyanosis. Strength 5/5. Symmetrical without gross deformities. Neurologic:  Alert and  oriented x4;  Cranial nerves II-XII intact.  Skin:  Warm, dry, pink without significant lesions or rashes. Psych:  Alert and cooperative. Normal affect.  LAB RESULTS: Recent Labs    07/24/21 1252 07/24/21 2026 07/25/21 0505  WBC 4.7  --  4.0  HGB 6.9* 8.0* 7.3*  HCT 23.9* 26.8* 25.2*  PLT 235  --  216   BMET Recent Labs    07/24/21 1252 07/25/21 0505  NA 137 137  K 3.7 3.9  CL 107 107  CO2 25 24  GLUCOSE 109* 114*  BUN 10 12  CREATININE 0.88 0.92  CALCIUM 9.0 8.8*   LFT No results for input(s): PROT, ALBUMIN, AST, ALT, ALKPHOS, BILITOT, BILIDIR, IBILI in the last 72 hours. PT/INR No results for input(s): LABPROT, INR in the last 72 hours.  STUDIES: DG Chest Port 1 View  Result Date: 07/24/2021 CLINICAL DATA:  Dizziness, low oxygen, headaches. EXAM: PORTABLE CHEST 1 VIEW COMPARISON:  Chest x-ray 06/25/2020, CT chest 04/24/2007 FINDINGS: The heart and mediastinal contours are unchanged. Coronary artery stent. Cardiac surgical changes overlie the mediastinum. Bibasilar atelectasis. No focal consolidation. No pulmonary edema. No pleural effusion. No pneumothorax. No acute osseous  abnormality. IMPRESSION: No active disease. Electronically Signed   By: Iven Finn M.D.   On: 07/24/2021 15:39       Impression / Plan:   Rectal bleeding- ddx including diverticular, anal outlet, other - he has had recent colonoscopy so doubt colon cancer- will review with Dr Haig Prophet  Thank  you very much for this consult. These services were provided by Stephens November, NP-C, in collaboration with Andrey Farmer MD, with whom I have discussed this patient in full.   Stephens November, NP-C   Addendum: reviewed with Dr Haig Prophet- offered egd/colon for luminal eval due to his recent bleeding/IDA however patient declines colonoscopy- agreeable to egd. So we will arrange this for him.

## 2021-07-25 NOTE — Plan of Care (Signed)

## 2021-07-26 ENCOUNTER — Encounter
Admission: EM | Disposition: A | Discharge status: Home / Self Care | Payer: Self-pay | Source: Home / Self Care | Attending: Internal Medicine

## 2021-07-26 ENCOUNTER — Other Ambulatory Visit: Payer: Self-pay

## 2021-07-26 ENCOUNTER — Inpatient Hospital Stay: Payer: Medicare Other | Admitting: Certified Registered Nurse Anesthetist

## 2021-07-26 ENCOUNTER — Encounter: Payer: Self-pay | Admitting: Internal Medicine

## 2021-07-26 HISTORY — PX: ESOPHAGOGASTRODUODENOSCOPY: SHX5428

## 2021-07-26 LAB — BPAM RBC
Blood Product Expiration Date: 202209272359
Blood Product Expiration Date: 202209272359
ISSUE DATE / TIME: 202208281450
ISSUE DATE / TIME: 202208291148
Unit Type and Rh: 6200
Unit Type and Rh: 6200

## 2021-07-26 LAB — TYPE AND SCREEN
ABO/RH(D): A POS
Antibody Screen: NEGATIVE
Unit division: 0
Unit division: 0

## 2021-07-26 LAB — GLUCOSE, CAPILLARY
Glucose-Capillary: 104 mg/dL — ABNORMAL HIGH (ref 70–99)
Glucose-Capillary: 99 mg/dL (ref 70–99)

## 2021-07-26 SURGERY — EGD (ESOPHAGOGASTRODUODENOSCOPY)
Anesthesia: General

## 2021-07-26 MED ORDER — PROPOFOL 500 MG/50ML IV EMUL
INTRAVENOUS | Status: DC | PRN
  Administered 2021-07-26: 150 ug/kg/min via INTRAVENOUS

## 2021-07-26 MED ORDER — OXYCODONE-ACETAMINOPHEN 10-325 MG PO TABS: 1.0000 | ORAL_TABLET | Freq: Three times a day (TID) | ORAL | 0 refills | Status: DC | PRN

## 2021-07-26 MED ORDER — PEG 3350-KCL-NA BICARB-NACL 420 G PO SOLR
4000.0000 mL | Freq: Once | ORAL | Status: AC
  Administered 2021-07-26: 4000 mL via ORAL
  Filled 2021-07-26: qty 4000

## 2021-07-26 MED ORDER — SODIUM CHLORIDE 0.9 % IV SOLN: INTRAVENOUS | Status: DC

## 2021-07-26 MED ORDER — LIDOCAINE HCL (CARDIAC) PF 100 MG/5ML IV SOSY
PREFILLED_SYRINGE | INTRAVENOUS | Status: DC | PRN
  Administered 2021-07-26: 100 mg via INTRAVENOUS

## 2021-07-26 MED ORDER — PROPOFOL 10 MG/ML IV BOLUS
INTRAVENOUS | Status: DC | PRN
  Administered 2021-07-26: 80 mg via INTRAVENOUS

## 2021-07-26 MED ORDER — GLYCOPYRROLATE 0.2 MG/ML IJ SOLN
INTRAMUSCULAR | Status: DC | PRN
  Administered 2021-07-26: .2 mg via INTRAVENOUS

## 2021-07-26 MED ORDER — PANTOPRAZOLE SODIUM 40 MG IV SOLR
40.0000 mg | Freq: Two times a day (BID) | INTRAVENOUS | Status: DC
  Administered 2021-07-26 – 2021-07-28 (×4): 40 mg via INTRAVENOUS
  Filled 2021-07-26 (×4): qty 40

## 2021-07-26 NOTE — Transfer of Care (Signed)
Immediate Anesthesia Transfer of Care Note  Patient: Craig York  Procedure(s) Performed: ESOPHAGOGASTRODUODENOSCOPY (EGD)  Patient Location: Endoscopy Unit  Anesthesia Type:General  Level of Consciousness: drowsy  Airway & Oxygen Therapy: Patient Spontanous Breathing  Post-op Assessment: Report given to RN and Post -op Vital signs reviewed and stable  Post vital signs: Reviewed and stable  Last Vitals:  Vitals Value Taken Time  BP 123/66 07/26/21 1406  Temp    Pulse 78 07/26/21 1406  Resp 12 07/26/21 1406  SpO2 100 % 07/26/21 1406  Vitals shown include unvalidated device data.  Last Pain:  Vitals:   07/26/21 1318  TempSrc: Temporal  PainSc: 0-No pain         Complications: No notable events documented.

## 2021-07-26 NOTE — Op Note (Signed)
[facility] Patient Name: Craig York Procedure Date: 07/26/2021 1:53 PM MRN: [id number] Account #: [id number] Date of Birth: 04-29-1951 Admit Type: Outpatient Age: 70 Room: [facility] ENDO ROOM 1 Gender: Male Note Status: Finalized Procedure:             Upper GI endoscopy Indications:           Hematochezia Providers:             Andrey Farmer MD, MD Referring MD:          Kirstie Peri. Caryn Section, MD (Referring MD) Medicines:             Monitored Anesthesia Care Complications:         No immediate complications. Estimated blood loss:                         Minimal. Procedure:             Pre-Anesthesia Assessment:                        - Prior to the procedure, a History and Physical was                         performed, and patient medications and allergies were                         reviewed. The patient is competent. The risks and                         benefits of the procedure and the sedation options and                         risks were discussed with the patient. All questions                         were answered and informed consent was obtained.                         Patient identification and proposed procedure were                         verified by the physician, the nurse, the anesthetist                         and the technician in the endoscopy suite. Mental                         Status Examination: alert and oriented. Airway                         Examination: normal oropharyngeal airway and neck                         mobility. Respiratory Examination: clear to                         auscultation. CV Examination: normal. Prophylactic                         Antibiotics:  The patient does not require prophylactic                         antibiotics. Prior Anticoagulants: The patient has                         taken no previous anticoagulant or antiplatelet                         agents. ASA Grade Assessment: II - A  patient with mild                         systemic disease. After reviewing the risks and                         benefits, the patient was deemed in satisfactory                         condition to undergo the procedure. The anesthesia                         plan was to use monitored anesthesia care (MAC).                         Immediately prior to administration of medications,                         the patient was re-assessed for adequacy to receive                         sedatives. The heart rate, respiratory rate, oxygen                         saturations, blood pressure, adequacy of pulmonary                         ventilation, and response to care were monitored                         throughout the procedure. The physical status of the                         patient was re-assessed after the procedure.                        After obtaining informed consent, the endoscope was                         passed under direct vision. Throughout the procedure,                         the patient's blood pressure, pulse, and oxygen                         saturations were monitored continuously. The Endoscope                         was introduced through the mouth, and advanced to the  second part of duodenum. The upper GI endoscopy was                         accomplished without difficulty. The patient tolerated                         the procedure well. Findings:      The examined esophagus was normal.      A single 7 mm papule (nodule) with no stigmata of recent bleeding was       found in the gastric antrum. Biopsies were taken with a cold forceps for       histology. Estimated blood loss was minimal.      The examined duodenum was normal. Impression:            - Normal esophagus.                        - A single papule (nodule) found in the stomach.                         Biopsied.                        - Normal examined  duodenum. Recommendation:        - Return patient to hospital ward for ongoing care.                        - Advance diet as tolerated.                        - Continue present medications.                        - Await pathology results.                        - Patient previously refused colonoscopy so if                         hemoglobin stable then no further GI interventions                         indicated. Given colonoscopy one year ago that was                         unremarkable then current episode likely benign in                         nature either diverticular vs hemorrhoidal. Procedure Code(s):     --- Professional ---                        [phone], Esophagogastroduodenoscopy, flexible,                         transoral; with biopsy, single or multiple Diagnosis Code(s):     --- Professional ---                        K31.89, Other diseases of stomach and duodenum  K92.1, Melena (includes Hematochezia) CPT copyright 2019 American Medical Association. All rights reserved. The codes documented in this report are preliminary and upon coder review may  be revised to meet current compliance requirements. Andrey Farmer MD, MD 07/26/2021 2:06:49 PM Number of Addenda: 0 Note Initiated On: 07/26/2021 1:53 PM Estimated Blood Loss:  Estimated blood loss was minimal.      [facility]

## 2021-07-26 NOTE — Anesthesia Preprocedure Evaluation (Signed)
Anesthesia Evaluation  Patient identified by MRN, date of birth, ID band Patient awake    Reviewed: Allergy & Precautions, H&P , NPO status , Patient's Chart, lab work & pertinent test results, reviewed documented beta blocker date and time   Airway Mallampati: II   Neck ROM: full    Dental  (+) Poor Dentition   Pulmonary neg pulmonary ROS, former smoker,    Pulmonary exam normal        Cardiovascular Exercise Tolerance: Poor hypertension, On Medications + angina with exertion + CAD and + Past MI  Normal cardiovascular exam Rhythm:regular Rate:Normal     Neuro/Psych  Neuromuscular disease negative psych ROS   GI/Hepatic negative GI ROS, Neg liver ROS,   Endo/Other  negative endocrine ROSdiabetes, Well Controlled, Type 2, Oral Hypoglycemic Agents  Renal/GU negative Renal ROS  negative genitourinary   Musculoskeletal   Abdominal   Peds  Hematology  (+) Blood dyscrasia, anemia ,   Anesthesia Other Findings Past Medical History: No date: Anginal pain (HCC) No date: Benign neoplasm of descending colon No date: BPH (benign prostatic hyperplasia) No date: Coronary artery disease No date: Diabetes mellitus without complication (HCC)     Comment:  Type II 07/23/2008: Hemorrhage of gastrointestinal tract No date: Hyperlipidemia No date: Hypertension No date: Iron deficiency anemia No date: Myocardial infarction ([place]) 12/25/2019: Myocardial infarction involving left anterior descending  (LAD) coronary artery (HCC) No date: Polyp of sigmoid colon 04/12/2020: Symptomatic anemia Past Surgical History: No date: CARDIAC CATHETERIZATION No date: chest surgery for fungal infection 07/10/2017: COLONOSCOPY WITH PROPOFOL; N/A     Comment:  Procedure: COLONOSCOPY WITH PROPOFOL;  Surgeon: [name], MD;  Location: [facility] ENDOSCOPY;  Service:               Endoscopy;  Laterality: N/A; 04/13/2020: COLONOSCOPY WITH  PROPOFOL; N/A     Comment:  Procedure: COLONOSCOPY WITH PROPOFOL;  Surgeon: [name], MD;  Location: [facility] ENDOSCOPY;  Service:               Endoscopy;  Laterality: N/A; 08/12/2019: CORONARY ARTERY BYPASS GRAFT; N/A     Comment:  Procedure: CORONARY ARTERY BYPASS GRAFTING (CABG)X 4;                Surgeon: [name], MD;  Location: [place];                Service: Open Heart Surgery;  Laterality: N/A;  CABG x  4              using bilateral internal mammary arteries and               endoscopically harvested left saphenous vein 07/10/2017: ESOPHAGOGASTRODUODENOSCOPY (EGD) WITH PROPOFOL; N/A     Comment:  Procedure: ESOPHAGOGASTRODUODENOSCOPY (EGD) WITH               PROPOFOL;  Surgeon: [name], MD;  Location: [facility]               ENDOSCOPY;  Service: Endoscopy;  Laterality: N/A; 04/13/2020: ESOPHAGOGASTRODUODENOSCOPY (EGD) WITH PROPOFOL; N/A     Comment:  Procedure: ESOPHAGOGASTRODUODENOSCOPY (EGD) WITH               PROPOFOL;  Surgeon: [name], MD;  Location: [facility]  ENDOSCOPY;  Service: Endoscopy;  Laterality: N/A; 04/14/2020: GIVENS CAPSULE STUDY; N/A     Comment:  Procedure: GIVENS CAPSULE STUDY;  Surgeon: Jonathon Bellows,               MD;  Location: [facility] ENDOSCOPY;  Service:               Gastroenterology;  Laterality: N/A; No date: HERNIA REPAIR 05/06/2020: INTERCOSTAL NERVE BLOCK; Right     Comment:  Procedure: INTERCOSTAL NERVE BLOCK;  Surgeon: Lajuana Matte, MD;  Location: [place];  Service: Thoracic;                Laterality: Right; 07/21/2019: LEFT HEART CATH AND CORONARY ANGIOGRAPHY; Left     Comment:  Procedure: LEFT HEART CATH AND CORONARY ANGIOGRAPHY;                Surgeon: [name], MD;  Location: [place]              CV LAB;  Service: Cardiovascular;  Laterality: Left; 08/12/2019: TEE WITHOUT CARDIOVERSION; N/A     Comment:  Procedure: TRANSESOPHAGEAL ECHOCARDIOGRAM (TEE);                Surgeon:  [name], MD;  Location: [place];                Service: Open Heart Surgery;  Laterality: N/A; BMI    Body Mass Index: 24.23 kg/m     Reproductive/Obstetrics negative OB ROS                             Anesthesia Physical Anesthesia Plan  ASA: 3  Anesthesia Plan: General   Post-op Pain Management:    Induction:   PONV Risk Score and Plan:   Airway Management Planned:   Additional Equipment:   Intra-op Plan:   Post-operative Plan:   Informed Consent: I have reviewed the patients History and Physical, chart, labs and discussed the procedure including the risks, benefits and alternatives for the proposed anesthesia with the patient or authorized representative who has indicated his/her understanding and acceptance.     Dental Advisory Given  Plan Discussed with: CRNA  Anesthesia Plan Comments:         Anesthesia Quick Evaluation

## 2021-07-26 NOTE — Anesthesia Procedure Notes (Signed)
Date/Time: 07/26/2021 1:55 PM Performed by: Lily Peer, Meryn Sarracino, CRNA Pre-anesthesia Checklist: Patient identified, Emergency Drugs available, Suction available, Patient being monitored and Timeout performed Patient Re-evaluated:Patient Re-evaluated prior to induction Oxygen Delivery Method: Nasal cannula Induction Type: IV induction

## 2021-07-26 NOTE — Progress Notes (Signed)
PROGRESS NOTE    Craig York  [id number] DOB: March 02, 1951 DOA: 07/24/2021 PCP: [name], MD    Brief Narrative:  70 y.o. male with medical history significant for hyperlipidemia, hypertension, CAD status post CABG x4, Noninsulin-dependent diabetes mellitus, erectile dysfunction, presents to the emergency department for chief concerns of dizziness and blood in his stool.   He noticed bright red blood in the toilet for 3-4 days. Yesterday while on the toilet twice, he got dizzy and both bowel movements was all blood. He reports experiencing the same symptoms last year when he needed blood transfusion and had bowel movements with blood.  Hemoglobin 6.9 on admission.  Transfused 1 unit and hemoglobin responded to 8, subsequent downtrend to 7.3  Transfuse additional unit hemoglobin responded to 8.6.  Underwent EGD on 8/30.  Results normal.  Patient was initially refusing colonoscopy however now is agreeable.  GI made aware.  Tentative plan for colonoscopy 8/31.   Assessment & Plan:   Principal Problem:   GI bleed Active Problems:   Coronary artery disease   Diabetes mellitus, type 2 (HCC)   ED (erectile dysfunction) of organic origin   H/O acute myocardial infarction   HLD (hyperlipidemia)   Essential (primary) hypertension   Iron (Fe) deficiency anemia   S/P CABG x 4   Paralysis, diaphragm   Lower GI bleed  Acute on chronic blood loss anemia Bright red blood per rectum Hemoglobin on presentation 6.9 Baseline 9.6-10.4 Status posttransfusion 1 unit PRBC, hemoglobin 8 Subsequent downtrend to 7.3 Transfuse additional unit, hemoglobin responded 8.6 EGD 8/30, results normal.  1 small polyp was biopsied Initially refusing colonoscopy, now agreeable Plan: Protonix 40 mg twice daily Clear liquid diet today Plan for colonoscopy tomorrow 8/31 GI aware, n.p.o. after midnight  Hyperlipidemia Crestor 20 mg daily  History of peptic ulcer disease Twice daily  PPI  GERD/gastritis PPI   DVT prophylaxis: SCD Code Status: Full Family Communication: None today Disposition Plan: Status is: Inpatient  Remains inpatient appropriate because:Inpatient level of care appropriate due to severity of illness  Dispo: The patient is from: Home              Anticipated d/c is to: Home              Patient currently is not medically stable to d/c.   Difficult to place patient No   GI bleed and acute on chronic blood loss anemia.  EGD normal.  Plan for colonoscopy 8/31          Level of care: Med-Surg  Consultants:  GI  Procedures:  None  Antimicrobials:  None   Subjective: Did examined.  Sitting comfortably in bed.  No visible distress.  No pain complaints.  Objective: Vitals:   07/26/21 1405 07/26/21 1415 07/26/21 1425 07/26/21 1514  BP:  120/70 122/90 139/75  Pulse:    63  Resp: '18 16 20 18  '$ Temp: 98.2 F (36.8 C)   98.2 F (36.8 C)  TempSrc: Temporal   Oral  SpO2:    100%  Weight:      Height:        Intake/Output Summary (Last 24 hours) at 07/26/2021 1534 Last data filed at 07/26/2021 1406 Gross per 24 hour  Intake 200 ml  Output 750 ml  Net -550 ml   Filed Weights   07/24/21 1247 07/24/21 2033 07/26/21 1318  Weight: 86 kg 83.3 kg 83.3 kg    Examination:  General exam: Appears calm and comfortable  Respiratory system: Clear to auscultation. Respiratory effort normal. Cardiovascular system: S1 & S2 heard, RRR. No JVD, murmurs, rubs, gallops or clicks. No pedal edema. Gastrointestinal system: Abdomen is nondistended, soft and nontender. No organomegaly or masses felt. Normal bowel sounds heard. Central nervous system: Alert and oriented. No focal neurological deficits. Extremities: Symmetric 5 x 5 power. Skin: No rashes, lesions or ulcers Psychiatry: Judgement and insight appear normal. Mood & affect appropriate.     Data Reviewed: I have personally reviewed following labs and imaging  studies  CBC: Recent Labs  Lab 07/24/21 1252 07/24/21 2026 07/25/21 0505 07/25/21 1601  WBC 4.7  --  4.0  --   HGB 6.9* 8.0* 7.3* 8.6*  HCT 23.9* 26.8* 25.2* 28.5*  MCV 92.3  --  90.3  --   PLT 235  --  216  --    Basic Metabolic Panel: Recent Labs  Lab 07/24/21 1252 07/25/21 0505  NA 137 137  K 3.7 3.9  CL 107 107  CO2 25 24  GLUCOSE 109* 114*  BUN 10 12  CREATININE 0.88 0.92  CALCIUM 9.0 8.8*   GFR: Estimated Creatinine Clearance: 85.6 mL/min (by C-G formula based on SCr of 0.92 mg/dL). Liver Function Tests: No results for input(s): AST, ALT, ALKPHOS, BILITOT, PROT, ALBUMIN in the last 168 hours. No results for input(s): LIPASE, AMYLASE in the last 168 hours. No results for input(s): AMMONIA in the last 168 hours. Coagulation Profile: No results for input(s): INR, PROTIME in the last 168 hours. Cardiac Enzymes: No results for input(s): CKTOTAL, CKMB, CKMBINDEX, TROPONINI in the last 168 hours. BNP (last 3 results) No results for input(s): PROBNP in the last 8760 hours. HbA1C: No results for input(s): HGBA1C in the last 72 hours. CBG: Recent Labs  Lab 07/25/21 0842 07/25/21 1142 07/25/21 1636 07/26/21 1049 07/26/21 1314  GLUCAP 122* 148* 134* 104* 99   Lipid Profile: No results for input(s): CHOL, HDL, LDLCALC, TRIG, CHOLHDL, LDLDIRECT in the last 72 hours. Thyroid Function Tests: No results for input(s): TSH, T4TOTAL, FREET4, T3FREE, THYROIDAB in the last 72 hours. Anemia Panel: Recent Labs    07/24/21 1252  FERRITIN 11*  TIBC 482*  IRON 280*   Sepsis Labs: No results for input(s): PROCALCITON, LATICACIDVEN in the last 168 hours.  Recent Results (from the past 240 hour(s))  Resp Panel by RT-PCR (Flu A&B, Covid) Nasopharyngeal Swab     Status: None   Collection Time: 07/24/21  2:40 PM   Specimen: Nasopharyngeal Swab; Nasopharyngeal(NP) swabs in vial transport medium  Result Value Ref Range Status   SARS Coronavirus 2 by RT PCR NEGATIVE  NEGATIVE Final    Comment: (NOTE) SARS-CoV-2 target nucleic acids are NOT DETECTED.  The SARS-CoV-2 RNA is generally detectable in upper respiratory specimens during the acute phase of infection. The lowest concentration of SARS-CoV-2 viral copies this assay can detect is 138 copies/mL. A negative result does not preclude SARS-Cov-2 infection and should not be used as the sole basis for treatment or other patient management decisions. A negative result may occur with  improper specimen collection/handling, submission of specimen other than nasopharyngeal swab, presence of viral mutation(s) within the areas targeted by this assay, and inadequate number of viral copies(<138 copies/mL). A negative result must be combined with clinical observations, patient history, and epidemiological information. The expected result is Negative.  Fact Sheet for Patients:  EntrepreneurPulse.com.au  Fact Sheet for Healthcare Providers:  IncredibleEmployment.be  This test is no t yet approved or cleared by the  [place] Architectural technologist and  has been authorized for detection and/or diagnosis of SARS-CoV-2 by FDA under an Print production planner (EUA). This EUA will remain  in effect (meaning this test can be used) for the duration of the COVID-19 declaration under Section 564(b)(1) of the Act, 21 U.S.C.section 360bbb-3(b)(1), unless the authorization is terminated  or revoked sooner.       Influenza A by PCR NEGATIVE NEGATIVE Final   Influenza B by PCR NEGATIVE NEGATIVE Final    Comment: (NOTE) The Xpert Xpress SARS-CoV-2/FLU/RSV plus assay is intended as an aid in the diagnosis of influenza from Nasopharyngeal swab specimens and should not be used as a sole basis for treatment. Nasal washings and aspirates are unacceptable for Xpert Xpress SARS-CoV-2/FLU/RSV testing.  Fact Sheet for Patients: EntrepreneurPulse.com.au  Fact Sheet for Healthcare  Providers: IncredibleEmployment.be  This test is not yet approved or cleared by the [place] FDA and has been authorized for detection and/or diagnosis of SARS-CoV-2 by FDA under an Emergency Use Authorization (EUA). This EUA will remain in effect (meaning this test can be used) for the duration of the COVID-19 declaration under Section 564(b)(1) of the Act, 21 U.S.C. section 360bbb-3(b)(1), unless the authorization is terminated or revoked.  Performed at [facility], [address] [place], [place] [address]          Radiology Studies: No results found.      Scheduled Meds:  pantoprazole (PROTONIX) IV  40 mg Intravenous Q12H   rosuvastatin  20 mg Oral QHS   Continuous Infusions:     LOS: 1 day    Time spent: 25 minutes    Sidney Ace, MD [facility] Pager [phone]  If 7PM-7AM, please contact night-coverage 07/26/2021, 3:34 PM

## 2021-07-27 ENCOUNTER — Inpatient Hospital Stay: Payer: Medicare Other | Admitting: Anesthesiology

## 2021-07-27 ENCOUNTER — Encounter: Payer: Self-pay | Admitting: Internal Medicine

## 2021-07-27 ENCOUNTER — Encounter
Admission: EM | Disposition: A | Discharge status: Home / Self Care | Payer: Self-pay | Source: Home / Self Care | Attending: Internal Medicine

## 2021-07-27 ENCOUNTER — Telehealth: Payer: Self-pay | Admitting: Gastroenterology

## 2021-07-27 DIAGNOSIS — K649 Unspecified hemorrhoids: Secondary | ICD-10-CM

## 2021-07-27 HISTORY — PX: COLONOSCOPY WITH PROPOFOL: SHX5780

## 2021-07-27 LAB — CBC WITH DIFFERENTIAL/PLATELET
Abs Immature Granulocytes: 0.01 10*3/uL (ref 0.00–0.07)
Basophils Absolute: 0 10*3/uL (ref 0.0–0.1)
Basophils Relative: 1 %
Eosinophils Absolute: 0.1 10*3/uL (ref 0.0–0.5)
Eosinophils Relative: 2 %
HCT: 28 % — ABNORMAL LOW (ref 39.0–52.0)
Hemoglobin: 8.7 g/dL — ABNORMAL LOW (ref 13.0–17.0)
Immature Granulocytes: 0 %
Lymphocytes Relative: 26 %
Lymphs Abs: 1 10*3/uL (ref 0.7–4.0)
MCH: 27.4 pg (ref 26.0–34.0)
MCHC: 31.1 g/dL (ref 30.0–36.0)
MCV: 88.1 fL (ref 80.0–100.0)
Monocytes Absolute: 0.3 10*3/uL (ref 0.1–1.0)
Monocytes Relative: 9 %
Neutro Abs: 2.3 10*3/uL (ref 1.7–7.7)
Neutrophils Relative %: 62 %
Platelets: 196 10*3/uL (ref 150–400)
RBC: 3.18 MIL/uL — ABNORMAL LOW (ref 4.22–5.81)
RDW: 14.1 % (ref 11.5–15.5)
WBC: 3.7 10*3/uL — ABNORMAL LOW (ref 4.0–10.5)
nRBC: 0 % (ref 0.0–0.2)

## 2021-07-27 LAB — BASIC METABOLIC PANEL
Anion gap: 5 (ref 5–15)
BUN: 10 mg/dL (ref 8–23)
CO2: 25 mmol/L (ref 22–32)
Calcium: 9 mg/dL (ref 8.9–10.3)
Chloride: 108 mmol/L (ref 98–111)
Creatinine, Ser: 0.92 mg/dL (ref 0.61–1.24)
GFR, Estimated: >60 mL/min (ref >60)
Glucose, Bld: 110 mg/dL — ABNORMAL HIGH (ref 70–99)
Potassium: 4.2 mmol/L (ref 3.5–5.1)
Sodium: 138 mmol/L (ref 135–145)

## 2021-07-27 LAB — VITAMIN D 25 HYDROXY (VIT D DEFICIENCY, FRACTURES): Vit D, 25-Hydroxy: 19.97 ng/mL — ABNORMAL LOW (ref 30–100)

## 2021-07-27 LAB — GLUCOSE, CAPILLARY: Glucose-Capillary: 101 mg/dL — ABNORMAL HIGH (ref 70–99)

## 2021-07-27 LAB — VITAMIN B12: Vitamin B-12: 402 pg/mL (ref 180–914)

## 2021-07-27 LAB — FOLATE: Folate: 32 ng/mL (ref >5.9)

## 2021-07-27 SURGERY — COLONOSCOPY WITH PROPOFOL
Anesthesia: General

## 2021-07-27 MED ORDER — VITAMIN D (ERGOCALCIFEROL) 1.25 MG (50000 UNIT) PO CAPS
50000.0000 [IU] | ORAL_CAPSULE | ORAL | Status: DC
Start: 2021-07-27 — End: 2021-07-28
  Administered 2021-07-27: 50000 [IU] via ORAL
  Filled 2021-07-27: qty 1

## 2021-07-27 MED ORDER — PROPOFOL 10 MG/ML IV BOLUS
INTRAVENOUS | Status: DC | PRN
  Administered 2021-07-27: 70 mg via INTRAVENOUS

## 2021-07-27 MED ORDER — LOSARTAN POTASSIUM 50 MG PO TABS
50.0000 mg | ORAL_TABLET | Freq: Every day | ORAL | Status: DC
  Administered 2021-07-27 – 2021-07-28 (×2): 50 mg via ORAL
  Filled 2021-07-27 (×2): qty 1

## 2021-07-27 MED ORDER — SODIUM CHLORIDE 0.9 % IV SOLN
INTRAVENOUS | Status: DC
  Administered 2021-07-27: 1000 mL via INTRAVENOUS

## 2021-07-27 MED ORDER — AMLODIPINE BESYLATE 10 MG PO TABS
10.0000 mg | ORAL_TABLET | Freq: Every day | ORAL | Status: DC
  Administered 2021-07-27 – 2021-07-28 (×2): 10 mg via ORAL
  Filled 2021-07-27 (×2): qty 1

## 2021-07-27 MED ORDER — PROPOFOL 500 MG/50ML IV EMUL
INTRAVENOUS | Status: DC | PRN
  Administered 2021-07-27: 150 ug/kg/min via INTRAVENOUS

## 2021-07-27 NOTE — Anesthesia Procedure Notes (Signed)
Date/Time: 07/27/2021 12:49 PM Performed by: Johnna Acosta, CRNA Pre-anesthesia Checklist: Patient identified, Emergency Drugs available, Suction available, Patient being monitored and Timeout performed Patient Re-evaluated:Patient Re-evaluated prior to induction Oxygen Delivery Method: Nasal cannula Preoxygenation: Pre-oxygenation with 100% oxygen Induction Type: IV induction

## 2021-07-27 NOTE — Progress Notes (Signed)
PROGRESS NOTE    Craig York  [id number] DOB: May 14, 1951 DOA: 07/24/2021 PCP: [name], MD    Brief Narrative:  70 y.o. male with medical history significant for hyperlipidemia, hypertension, CAD status post CABG x4, Noninsulin-dependent diabetes mellitus, erectile dysfunction, presents to the emergency department for chief concerns of dizziness and blood in his stool.   He noticed bright red blood in the toilet for 3-4 days. Yesterday while on the toilet twice, he got dizzy and both bowel movements was all blood. He reports experiencing the same symptoms last year when he needed blood transfusion and had bowel movements with blood.  Hemoglobin 6.9 on admission.  Transfused 1 unit and hemoglobin responded to 8, subsequent downtrend to 7.3  Transfuse additional unit hemoglobin responded to 8.6.  Underwent EGD on 8/30.  Results normal.  Patient was initially refusing colonoscopy however now is agreeable.  GI made aware.  Tentative plan for colonoscopy 8/31.   Assessment & Plan:   Principal Problem:   GI bleed Active Problems:   Coronary artery disease   Diabetes mellitus, type 2 (HCC)   ED (erectile dysfunction) of organic origin   H/O acute myocardial infarction   HLD (hyperlipidemia)   Essential (primary) hypertension   Iron (Fe) deficiency anemia   S/P CABG x 4   Paralysis, diaphragm   Lower GI bleed  Acute on chronic blood loss anemia Bright red blood per rectum Hemoglobin on presentation 6.9 Baseline 9.6-10.4 Status posttransfusion 1 unit PRBC, hemoglobin 8 Subsequent downtrend to 7.3 Transfuse additional unit, hemoglobin responded 8.6--8.7 EGD 8/30, results normal.  1 small polyp was biopsied Initially refusing colonoscopy, now agreeable Plan: Protonix 40 mg twice daily S/p colonoscopy, found to hav hemorrhoids, GI recommended follow-up as an outpatient   Hypertension Resumed amlodipine and losartan.  Dose with holding parameters Hold  metoprolol We will continue to monitor BP and titrate medications accordingly   Hyperlipidemia Crestor 20 mg daily  History of peptic ulcer disease Twice daily PPI  GERD/gastritis PPI   DVT prophylaxis: SCD Code Status: Full Family Communication: None today Disposition Plan: Status is: Inpatient  Remains inpatient appropriate because:Inpatient level of care appropriate due to severity of illness  Dispo: The patient is from: Home              Anticipated d/c is to: Home              Patient currently is not medically stable to d/c.  Discharge tomorrow a.m. if Hb remains stable   Difficult to place patient No    Level of care: Med-Surg  Consultants:  GI  Procedures:  S/p EGD 8/31 s/p colonoscopy, hemorrhoids  Antimicrobials:  None   Subjective: No significant overnight issues, patient was ready for colonoscopy, was hungry because it was scheduled in the afternoon.  Patient denied any active GI bleeding, denied any abdominal pain, no nausea vomiting.   Objective: Vitals:   07/27/21 1314 07/27/21 1315 07/27/21 1325 07/27/21 1603  BP: 119/68 119/68 126/62 140/76  Pulse: 74 75 65 69  Resp: '13 13 18 18  '$ Temp: (!) 96.8 F (36 C)   97.8 F (36.6 C)  TempSrc: Temporal   Oral  SpO2: 100% 100% 100% 100%  Weight:      Height:        Intake/Output Summary (Last 24 hours) at 07/27/2021 1730 Last data filed at 07/27/2021 1309 Gross per 24 hour  Intake 100 ml  Output 300 ml  Net -200 ml  Filed Weights   07/24/21 1247 07/24/21 2033 07/26/21 1318  Weight: 86 kg 83.3 kg 83.3 kg    Examination:  General exam: Appears calm and comfortable  Respiratory system: Clear to auscultation. Respiratory effort normal. Cardiovascular system: S1 & S2 heard, RRR. No JVD, murmurs, rubs, gallops or clicks. No pedal edema. Gastrointestinal system: Abdomen is nondistended, soft and nontender. No organomegaly or masses felt. Normal bowel sounds heard. Central nervous system:  Alert and oriented. No focal neurological deficits. Extremities: Symmetric 5 x 5 power. Skin: No rashes, lesions or ulcers Psychiatry: Judgement and insight appear normal. Mood & affect appropriate.     Data Reviewed: I have personally reviewed following labs and imaging studies  CBC: Recent Labs  Lab 07/24/21 1252 07/24/21 2026 07/25/21 0505 07/25/21 1601 07/27/21 0555  WBC 4.7  --  4.0  --  3.7*  NEUTROABS  --   --   --   --  2.3  HGB 6.9* 8.0* 7.3* 8.6* 8.7*  HCT 23.9* 26.8* 25.2* 28.5* 28.0*  MCV 92.3  --  90.3  --  88.1  PLT 235  --  216  --  123456   Basic Metabolic Panel: Recent Labs  Lab 07/24/21 1252 07/25/21 0505 07/27/21 1030  NA 137 137 138  K 3.7 3.9 4.2  CL 107 107 108  CO2 '25 24 25  '$ GLUCOSE 109* 114* 110*  BUN '10 12 10  '$ CREATININE 0.88 0.92 0.92  CALCIUM 9.0 8.8* 9.0   GFR: Estimated Creatinine Clearance: 85.6 mL/min (by C-G formula based on SCr of 0.92 mg/dL). Liver Function Tests: No results for input(s): AST, ALT, ALKPHOS, BILITOT, PROT, ALBUMIN in the last 168 hours. No results for input(s): LIPASE, AMYLASE in the last 168 hours. No results for input(s): AMMONIA in the last 168 hours. Coagulation Profile: No results for input(s): INR, PROTIME in the last 168 hours. Cardiac Enzymes: No results for input(s): CKTOTAL, CKMB, CKMBINDEX, TROPONINI in the last 168 hours. BNP (last 3 results) No results for input(s): PROBNP in the last 8760 hours. HbA1C: No results for input(s): HGBA1C in the last 72 hours. CBG: Recent Labs  Lab 07/25/21 1142 07/25/21 1636 07/26/21 1049 07/26/21 1314 07/27/21 1235  GLUCAP 148* 134* 104* 99 101*   Lipid Profile: No results for input(s): CHOL, HDL, LDLCALC, TRIG, CHOLHDL, LDLDIRECT in the last 72 hours. Thyroid Function Tests: No results for input(s): TSH, T4TOTAL, FREET4, T3FREE, THYROIDAB in the last 72 hours. Anemia Panel: Recent Labs    07/27/21 1030  VITAMINB12 402  FOLATE 32.0   Sepsis Labs: No  results for input(s): PROCALCITON, LATICACIDVEN in the last 168 hours.  Recent Results (from the past 240 hour(s))  Resp Panel by RT-PCR (Flu A&B, Covid) Nasopharyngeal Swab     Status: None   Collection Time: 07/24/21  2:40 PM   Specimen: Nasopharyngeal Swab; Nasopharyngeal(NP) swabs in vial transport medium  Result Value Ref Range Status   SARS Coronavirus 2 by RT PCR NEGATIVE NEGATIVE Final    Comment: (NOTE) SARS-CoV-2 target nucleic acids are NOT DETECTED.  The SARS-CoV-2 RNA is generally detectable in upper respiratory specimens during the acute phase of infection. The lowest concentration of SARS-CoV-2 viral copies this assay can detect is 138 copies/mL. A negative result does not preclude SARS-Cov-2 infection and should not be used as the sole basis for treatment or other patient management decisions. A negative result may occur with  improper specimen collection/handling, submission of specimen other than nasopharyngeal swab, presence of viral mutation(s) within  the areas targeted by this assay, and inadequate number of viral copies(<138 copies/mL). A negative result must be combined with clinical observations, patient history, and epidemiological information. The expected result is Negative.  Fact Sheet for Patients:  EntrepreneurPulse.com.au  Fact Sheet for Healthcare Providers:  IncredibleEmployment.be  This test is no t yet approved or cleared by the [place] FDA and  has been authorized for detection and/or diagnosis of SARS-CoV-2 by FDA under an Emergency Use Authorization (EUA). This EUA will remain  in effect (meaning this test can be used) for the duration of the COVID-19 declaration under Section 564(b)(1) of the Act, 21 U.S.C.section 360bbb-3(b)(1), unless the authorization is terminated  or revoked sooner.       Influenza A by PCR NEGATIVE NEGATIVE Final   Influenza B by PCR NEGATIVE NEGATIVE Final    Comment:  (NOTE) The Xpert Xpress SARS-CoV-2/FLU/RSV plus assay is intended as an aid in the diagnosis of influenza from Nasopharyngeal swab specimens and should not be used as a sole basis for treatment. Nasal washings and aspirates are unacceptable for Xpert Xpress SARS-CoV-2/FLU/RSV testing.  Fact Sheet for Patients: EntrepreneurPulse.com.au  Fact Sheet for Healthcare Providers: IncredibleEmployment.be  This test is not yet approved or cleared by the [place] FDA and has been authorized for detection and/or diagnosis of SARS-CoV-2 by FDA under an Emergency Use Authorization (EUA). This EUA will remain in effect (meaning this test can be used) for the duration of the COVID-19 declaration under Section 564(b)(1) of the Act, 21 U.S.C. section 360bbb-3(b)(1), unless the authorization is terminated or revoked.  Performed at [facility], [address], [place], [place] [address]          Radiology Studies: No results found.      Scheduled Meds:  amLODipine  10 mg Oral Daily   losartan  50 mg Oral Daily   pantoprazole (PROTONIX) IV  40 mg Intravenous Q12H   rosuvastatin  20 mg Oral QHS   Continuous Infusions:     LOS: 2 days    Time spent: 25 minutes    Val Riles, MD [facility] Pager [phone]  If 7PM-7AM, please contact night-coverage 07/27/2021, 5:30 PM

## 2021-07-27 NOTE — Progress Notes (Signed)
Mobility Specialist - Progress Note   07/27/21 1648  Mobility  Activity Refused mobility  Mobility performed by Mobility specialist    Pt declined, no reason specified. Will attempt session another date.   Kathee Delton Mobility Specialist 07/27/21, 4:48 PM

## 2021-07-27 NOTE — Anesthesia Preprocedure Evaluation (Signed)
Anesthesia Evaluation  Patient identified by MRN, date of birth, ID band Patient awake    Reviewed: Allergy & Precautions, H&P , NPO status , Patient's Chart, lab work & pertinent test results, reviewed documented beta blocker date and time   Airway Mallampati: II  TM Distance: >3 FB Neck ROM: full    Dental  (+) Poor Dentition   Pulmonary neg pulmonary ROS, former smoker,    Pulmonary exam normal        Cardiovascular Exercise Tolerance: Poor hypertension, On Medications + angina with exertion + CAD and + Past MI  Normal cardiovascular exam     Neuro/Psych  Neuromuscular disease negative psych ROS   GI/Hepatic negative GI ROS, Neg liver ROS,   Endo/Other  diabetes, Well Controlled, Type 2, Oral Hypoglycemic Agents  Renal/GU negative Renal ROS  negative genitourinary   Musculoskeletal  (+) Arthritis ,   Abdominal   Peds  Hematology  (+) Blood dyscrasia, anemia ,   Anesthesia Other Findings Past Medical History: No date: Anginal pain (HCC) No date: Benign neoplasm of descending colon No date: BPH (benign prostatic hyperplasia) No date: Coronary artery disease No date: Diabetes mellitus without complication (HCC)     Comment:  Type II 07/23/2008: Hemorrhage of gastrointestinal tract No date: Hyperlipidemia No date: Hypertension No date: Iron deficiency anemia No date: Myocardial infarction ([place]) 12/25/2019: Myocardial infarction involving left anterior descending  (LAD) coronary artery (HCC) No date: Polyp of sigmoid colon 04/12/2020: Symptomatic anemia Past Surgical History: No date: CARDIAC CATHETERIZATION No date: chest surgery for fungal infection 07/10/2017: COLONOSCOPY WITH PROPOFOL; N/A     Comment:  Procedure: COLONOSCOPY WITH PROPOFOL;  Surgeon: [name], MD;  Location: [facility] ENDOSCOPY;  Service:               Endoscopy;  Laterality: N/A; 04/13/2020: COLONOSCOPY WITH PROPOFOL; N/A      Comment:  Procedure: COLONOSCOPY WITH PROPOFOL;  Surgeon: [name], MD;  Location: [facility] ENDOSCOPY;  Service:               Endoscopy;  Laterality: N/A; 08/12/2019: CORONARY ARTERY BYPASS GRAFT; N/A     Comment:  Procedure: CORONARY ARTERY BYPASS GRAFTING (CABG)X 4;                Surgeon: [name], MD;  Location: [place];                Service: Open Heart Surgery;  Laterality: N/A;  CABG x  4              using bilateral internal mammary arteries and               endoscopically harvested left saphenous vein 07/10/2017: ESOPHAGOGASTRODUODENOSCOPY (EGD) WITH PROPOFOL; N/A     Comment:  Procedure: ESOPHAGOGASTRODUODENOSCOPY (EGD) WITH               PROPOFOL;  Surgeon: [name], MD;  Location: [facility]               ENDOSCOPY;  Service: Endoscopy;  Laterality: N/A; 04/13/2020: ESOPHAGOGASTRODUODENOSCOPY (EGD) WITH PROPOFOL; N/A     Comment:  Procedure: ESOPHAGOGASTRODUODENOSCOPY (EGD) WITH               PROPOFOL;  Surgeon: [name], MD;  Location: [facility]  ENDOSCOPY;  Service: Endoscopy;  Laterality: N/A; 04/14/2020: GIVENS CAPSULE STUDY; N/A     Comment:  Procedure: GIVENS CAPSULE STUDY;  Surgeon: Jonathon Bellows,               MD;  Location: [facility] ENDOSCOPY;  Service:               Gastroenterology;  Laterality: N/A; No date: HERNIA REPAIR 05/06/2020: INTERCOSTAL NERVE BLOCK; Right     Comment:  Procedure: INTERCOSTAL NERVE BLOCK;  Surgeon: Lajuana Matte, MD;  Location: [place];  Service: Thoracic;                Laterality: Right; 07/21/2019: LEFT HEART CATH AND CORONARY ANGIOGRAPHY; Left     Comment:  Procedure: LEFT HEART CATH AND CORONARY ANGIOGRAPHY;                Surgeon: [name], MD;  Location: [place]              CV LAB;  Service: Cardiovascular;  Laterality: Left; 08/12/2019: TEE WITHOUT CARDIOVERSION; N/A     Comment:  Procedure: TRANSESOPHAGEAL ECHOCARDIOGRAM (TEE);                Surgeon: [name],  MD;  Location: [place];                Service: Open Heart Surgery;  Laterality: N/A; BMI    Body Mass Index: 24.23 kg/m     Reproductive/Obstetrics negative OB ROS                             Anesthesia Physical  Anesthesia Plan  ASA: 3  Anesthesia Plan: General   Post-op Pain Management:    Induction: Intravenous  PONV Risk Score and Plan: TIVA and Propofol infusion  Airway Management Planned: Natural Airway and Nasal Cannula  Additional Equipment:   Intra-op Plan:   Post-operative Plan:   Informed Consent: I have reviewed the patients History and Physical, chart, labs and discussed the procedure including the risks, benefits and alternatives for the proposed anesthesia with the patient or authorized representative who has indicated his/her understanding and acceptance.     Dental Advisory Given  Plan Discussed with: CRNA  Anesthesia Plan Comments: (Patient consented for risks of anesthesia including but not limited to:  - adverse reactions to medications - risk of airway placement if required - damage to eyes, teeth, lips or other oral mucosa - nerve damage due to positioning  - sore throat or hoarseness - Damage to heart, brain, nerves, lungs, other parts of body or loss of life  Patient voiced understanding.)        Anesthesia Quick Evaluation

## 2021-07-27 NOTE — Op Note (Signed)
[facility] Patient Name: Craig York Procedure Date: 07/27/2021 12:29 PM MRN: [id number] Account #: [id number] Date of Birth: Oct 19, 1951 Admit Type: Inpatient Age: 70 Room: [facility] ENDO ROOM 4 Gender: Male Note Status: Finalized Procedure:             Colonoscopy Indications:           Rectal bleeding Providers:             Andrey Farmer MD, MD Referring MD:          Kirstie Peri. Caryn Section, MD (Referring MD) Medicines:             Monitored Anesthesia Care Complications:         No immediate complications. Procedure:             Pre-Anesthesia Assessment:                        - Prior to the procedure, a History and Physical was                         performed, and patient medications and allergies were                         reviewed. The patient is competent. The risks and                         benefits of the procedure and the sedation options and                         risks were discussed with the patient. All questions                         were answered and informed consent was obtained.                         Patient identification and proposed procedure were                         verified by the physician, the nurse, the anesthetist                         and the technician in the endoscopy suite. Mental                         Status Examination: alert and oriented. Airway                         Examination: normal oropharyngeal airway and neck                         mobility. Respiratory Examination: clear to                         auscultation. CV Examination: normal. Prophylactic                         Antibiotics: The patient does not require prophylactic  antibiotics. Prior Anticoagulants: The patient has                         taken no previous anticoagulant or antiplatelet                         agents. ASA Grade Assessment: II - A patient with mild                         systemic disease.  After reviewing the risks and                         benefits, the patient was deemed in satisfactory                         condition to undergo the procedure. The anesthesia                         plan was to use monitored anesthesia care (MAC).                         Immediately prior to administration of medications,                         the patient was re-assessed for adequacy to receive                         sedatives. The heart rate, respiratory rate, oxygen                         saturations, blood pressure, adequacy of pulmonary                         ventilation, and response to care were monitored                         throughout the procedure. The physical status of the                         patient was re-assessed after the procedure.                        After obtaining informed consent, the colonoscope was                         passed under direct vision. Throughout the procedure,                         the patient's blood pressure, pulse, and oxygen                         saturations were monitored continuously. The                         Colonoscope was introduced through the anus and                         advanced to the the terminal ileum. The colonoscopy  was performed without difficulty. The patient                         tolerated the procedure well. The quality of the bowel                         preparation was good. Findings:      Hemorrhoids were found on perianal exam.      The terminal ileum appeared normal.      A few small-mouthed diverticula were found in the sigmoid colon.      Internal hemorrhoids were found during retroflexion and during perianal       exam. The hemorrhoids were Grade III (internal hemorrhoids that prolapse       but require manual reduction). There was a small amount of fresh blood       on the hemorrhoids.      The exam was otherwise without abnormality on direct and retroflexion        views. Impression:            - Hemorrhoids found on perianal exam.                        - The examined portion of the ileum was normal.                        - Diverticulosis in the sigmoid colon.                        - Internal hemorrhoids.                        - The examination was otherwise normal on direct and                         retroflexion views.                        - No specimens collected. Recommendation:        - Return patient to hospital ward for possible                         discharge same day.                        - Advance diet as tolerated.                        - Continue present medications.                        - Return to GI clinic.                        - Refer to a surgeon at appointment to be scheduled to                         discuss hemorrhoid banding. Procedure Code(s):     --- Professional ---                        [phone], Colonoscopy, flexible; diagnostic, including  collection of specimen(s) by brushing or washing, when                         performed (separate procedure) Diagnosis Code(s):     --- Professional ---                        K64.2, Third degree hemorrhoids                        K62.5, Hemorrhage of anus and rectum                        K57.30, Diverticulosis of large intestine without                         perforation or abscess without bleeding CPT copyright 2019 American [employer] Association. All rights reserved. The codes documented in this report are preliminary and upon coder review may  be revised to meet current compliance requirements. Andrey Farmer MD, MD 07/27/2021 1:19:53 PM Number of Addenda: 0 Note Initiated On: 07/27/2021 12:29 PM Scope Withdrawal Time: 0 hours 7 minutes 28 seconds  Total Procedure Duration: 0 hours 12 minutes 55 seconds  Estimated Blood Loss:  Estimated blood loss: none.      [facility]

## 2021-07-27 NOTE — Telephone Encounter (Signed)
Referral entered  

## 2021-07-27 NOTE — Transfer of Care (Signed)
Immediate Anesthesia Transfer of Care Note  Patient: Craig York  Procedure(s) Performed: COLONOSCOPY WITH PROPOFOL  Patient Location: PACU  Anesthesia Type:General  Level of Consciousness: sedated  Airway & Oxygen Therapy: Patient Spontanous Breathing  Post-op Assessment: Report given to RN  Post vital signs: Reviewed and stable  Last Vitals:  Vitals Value Taken Time  BP 119/68 07/27/21 1315  Temp 36 C 07/27/21 1314  Pulse 75 07/27/21 1315  Resp 13 07/27/21 1315  SpO2 100 % 07/27/21 1315    Last Pain:  Vitals:   07/27/21 1314  TempSrc: Temporal  PainSc:       Patients Stated Pain Goal: 0 ([id number])  Complications: No notable events documented.

## 2021-07-27 NOTE — Anesthesia Postprocedure Evaluation (Signed)
Anesthesia Post Note  Patient: Craig York  Procedure(s) Performed: COLONOSCOPY WITH PROPOFOL  Patient location during evaluation: Endoscopy Anesthesia Type: General Level of consciousness: awake and alert Pain management: pain level controlled Vital Signs Assessment: post-procedure vital signs reviewed and stable Respiratory status: spontaneous breathing, nonlabored ventilation, respiratory function stable and patient connected to nasal cannula oxygen Cardiovascular status: blood pressure returned to baseline and stable Postop Assessment: no apparent nausea or vomiting Anesthetic complications: no   No notable events documented.   Last Vitals:  Vitals:   07/27/21 1315 07/27/21 1325  BP: 119/68 126/62  Pulse: 75 65  Resp: 13 18  Temp:    SpO2: 100% 100%    Last Pain:  Vitals:   07/27/21 1325  TempSrc:   PainSc: 0-No pain                 Precious Haws Ason Heslin

## 2021-07-28 ENCOUNTER — Encounter: Payer: Self-pay | Admitting: Gastroenterology

## 2021-07-28 LAB — BASIC METABOLIC PANEL
Anion gap: 6 (ref 5–15)
BUN: 11 mg/dL (ref 8–23)
CO2: 27 mmol/L (ref 22–32)
Calcium: 8.9 mg/dL (ref 8.9–10.3)
Chloride: 106 mmol/L (ref 98–111)
Creatinine, Ser: 0.81 mg/dL (ref 0.61–1.24)
GFR, Estimated: >60 mL/min (ref >60)
Glucose, Bld: 119 mg/dL — ABNORMAL HIGH (ref 70–99)
Potassium: 4.1 mmol/L (ref 3.5–5.1)
Sodium: 139 mmol/L (ref 135–145)

## 2021-07-28 LAB — CBC
HCT: 29.9 % — ABNORMAL LOW (ref 39.0–52.0)
Hemoglobin: 8.9 g/dL — ABNORMAL LOW (ref 13.0–17.0)
MCH: 26.2 pg (ref 26.0–34.0)
MCHC: 29.8 g/dL — ABNORMAL LOW (ref 30.0–36.0)
MCV: 87.9 fL (ref 80.0–100.0)
Platelets: 207 10*3/uL (ref 150–400)
RBC: 3.4 MIL/uL — ABNORMAL LOW (ref 4.22–5.81)
RDW: 14 % (ref 11.5–15.5)
WBC: 5 10*3/uL (ref 4.0–10.5)
nRBC: 0 % (ref 0.0–0.2)

## 2021-07-28 LAB — MAGNESIUM: Magnesium: 2 mg/dL (ref 1.7–2.4)

## 2021-07-28 LAB — PHOSPHORUS: Phosphorus: 3.7 mg/dL (ref 2.5–4.6)

## 2021-07-28 LAB — SURGICAL PATHOLOGY

## 2021-07-28 MED ORDER — VITAMIN D (ERGOCALCIFEROL) 1.25 MG (50000 UNIT) PO CAPS: 50000.0000 [IU] | ORAL_CAPSULE | ORAL | 0 refills | Status: AC

## 2021-07-28 NOTE — Discharge Summary (Signed)
[facility] Discharge Summary   Patient: Craig York [id number]  PCP: [name], MD  Date of admission: 07/24/2021   Date of discharge:  07/28/2021     Discharge Diagnoses:  Principal Problem:   GI bleed Active Problems:   Coronary artery disease   Diabetes mellitus, type 2 ([place])   ED (erectile dysfunction) of organic origin   H/O acute myocardial infarction   HLD (hyperlipidemia)   Essential (primary) hypertension   Iron (Fe) deficiency anemia   S/P CABG x 4   Paralysis, diaphragm   Lower GI bleed   Admitted From: Home Disposition:  Home   Recommendations for Outpatient Follow-up:  PCP: in 1 wk  GI as per schedule   Follow up LABS/TEST:  cbc in 1 wk   Diet recommendation: Cardiac diet  Activity: The patient is advised to gradually reintroduce usual activities, as tolerated  Discharge Condition: stable  Code Status: Full code   History of present illness: As per the H and P dictated on admission Hospital Course:  70 y.o. male with medical history significant for hyperlipidemia, hypertension, CAD status post CABG x4, Noninsulin-dependent diabetes mellitus, erectile dysfunction, presents to the emergency department for chief concerns of dizziness and blood in his stool. He noticed bright red blood in the toilet for 3-4 days. Yesterday while on the toilet twice, he got dizzy and both bowel movements was all blood. He reports experiencing the same symptoms last year when he needed blood transfusion and had bowel movements with blood. Hemoglobin 6.9 on admission.  Transfused 1 unit and hemoglobin responded to 8, subsequent downtrend to 7.3 Transfuse additional unit hemoglobin responded to 8.6.  Underwent EGD on 8/30.  Results normal.  Patient was initially refusing colonoscopy however now is agreeable.  GI made aware.  Tentative plan for colonoscopy 8/31.  Assessment & Plan: Principal Problem:   GI bleed Active Problems:   Coronary artery disease    Diabetes mellitus, type 2 ([facility])   ED (erectile dysfunction) of organic origin   H/O acute myocardial infarction   HLD (hyperlipidemia)   Essential (primary) hypertension   Iron (Fe) deficiency anemia   S/P CABG x 4   Paralysis, diaphragm   Lower GI bleed # Acute on chronic blood loss anemia, Bright red blood per rectum most likely due to hemorrhoids. Hemoglobin on presentation 6.9, Baseline 9.6-10.4. Status posttransfusion 1 unit PRBC, hemoglobin 8, Subsequent downtrend to 7.3, Transfuse additional unit, hemoglobin responded 8.6--8.7--8.9 stable today. EGD 8/30, results normal.  1 small polyp was biopsied Initially refusing colonoscopy, now agreeable. S/p Protonix 40 mg twice daily. S/p colonoscopy, found to hav hemorrhoids, GI recommended follow-up as an outpatient # Hypertension, Resumed amlodipine and losartan, held metoprolol due to soft blood pressure risk of hypotension, resumed all home medications.  Patient was advised to monitor BP and follow with PCP to titrate medication accordingly. # Hyperlipidemia,Crestor 20 mg daily # History of peptic ulcer disease, Twice daily PPI # GERD/gastritis, PP Body mass index is 24.23 kg/m.  Nutrition Interventions:    - Patient was instructed, not to drive, operate heavy machinery, perform activities at heights, swimming or participation in water activities or provide baby sitting services while on Pain, Sleep and Anxiety Medications; until his outpatient Physician has advised to do so again.  - Also recommended to not to take more than prescribed Pain, Sleep and Anxiety Medications.  Patient was ambulatory without any assistance. On the day of the discharge the patient's vitals were stable, and no other  acute medical condition were reported by patient. the patient was felt safe to be discharge at Home .  Consultants: GI Procedures: EGD And colonoscopy, positive for hemorrhoids, recommended to follow-up with GI as an outpatient  Discharge  Exam: General: Appear in no distress, no Rash; Oral Mucosa Clear, moist. Cardiovascular: S1 and S2 Present, no Murmur, Respiratory: normal respiratory effort, Bilateral Air entry present and no Crackles, no wheezes Abdomen: Bowel Sound present, Soft and no tenderness, no hernia Extremities: no Pedal edema, no calf tenderness Neurology: alert and oriented to time, place, and person affect appropriate.  Filed Weights   07/24/21 1247 07/24/21 2033 07/26/21 1318  Weight: 86 kg 83.3 kg 83.3 kg   Vitals:   07/28/21 0429 07/28/21 0756  BP: 127/76 136/78  Pulse: 73 75  Resp: 20 17  Temp: 98.4 F (36.9 C) (!) 97.3 F (36.3 C)  SpO2: 100% 100%    DISCHARGE MEDICATION: Allergies as of 07/28/2021       Reactions   Simvastatin    Elevated CK   Penicillins Rash   Did it involve swelling of the face/tongue/throat, SOB, or low BP? No Did it involve sudden or severe rash/hives, skin peeling, or any reaction on the inside of your mouth or nose? No Did you need to seek medical attention at a hospital or doctor's office? No When did it last happen?       If all above answers are "NO", may proceed with cephalosporin use.        Medication List     TAKE these medications    acetaminophen 500 MG tablet Commonly known as: TYLENOL Take 1,000 mg by mouth every 6 (six) hours as needed for moderate pain.   amLODipine 10 MG tablet Commonly known as: NORVASC Take 1 tablet (10 mg total) by mouth daily.   aspirin EC 81 MG tablet Take 81 mg by mouth daily.   empagliflozin 25 MG Tabs tablet Commonly known as: Jardiance Take 1 tablet (25 mg total) by mouth daily before breakfast.   ferrous sulfate 325 (65 FE) MG tablet Take 1 tablet (325 mg total) by mouth 2 (two) times daily with a meal.   losartan 50 MG tablet Commonly known as: COZAAR Take 50 mg by mouth daily.   metFORMIN 500 MG tablet Commonly known as: GLUCOPHAGE Take 2 tablets (1,000 mg total) by mouth daily.   metoprolol  succinate 25 MG 24 hr tablet Commonly known as: TOPROL-XL Take 1 tablet (25 mg total) by mouth daily.   oxyCODONE-acetaminophen 10-325 MG tablet Commonly known as: PERCOCET Take 1 tablet by mouth every 8 (eight) hours as needed for pain.   pantoprazole 40 MG tablet Commonly known as: PROTONIX Take 1 tablet (40 mg total) by mouth daily.   rosuvastatin 20 MG tablet Commonly known as: CRESTOR TAKE 1 TABLET BY MOUTH  DAILY   Systane Complete 0.6 % Soln Generic drug: Propylene Glycol Place 1 drop into both eyes 3 (three) times daily as needed (dry/irritated eyes.).   tadalafil 20 MG tablet Commonly known as: Cialis Take 1 tablet (20 mg total) by mouth daily as needed for erectile dysfunction.   Vitamin D (Ergocalciferol) 1.25 MG (50000 UNIT) Caps capsule Commonly known as: DRISDOL Take 1 capsule (50,000 Units total) by mouth every 7 (seven) days. Start taking on: August 03, 2021       Allergies  Allergen Reactions   Simvastatin     Elevated CK   Penicillins Rash    Did it involve  swelling of the face/tongue/throat, SOB, or low BP? No Did it involve sudden or severe rash/hives, skin peeling, or any reaction on the inside of your mouth or nose? No Did you need to seek medical attention at a hospital or doctor's office? No When did it last happen?       If all above answers are "NO", may proceed with cephalosporin use.    Discharge Instructions     Call MD for:  difficulty breathing, headache or visual disturbances   Complete by: As directed    Call MD for:  extreme fatigue   Complete by: As directed    Call MD for:  persistant dizziness or light-headedness   Complete by: As directed    Call MD for:  persistant nausea and vomiting   Complete by: As directed    Call MD for:  severe uncontrolled pain   Complete by: As directed    Call MD for:  temperature >100.4   Complete by: As directed    Diet - low sodium heart healthy   Complete by: As directed    Discharge  instructions   Complete by: As directed    Follow-up with PCP in 1 week, repeat CBC after 1 week, continue to monitor blood pressure at home and follow with PCP to titrate medication accordingly. Follow-up with GI as per schedule as an outpatient and follow with biopsy report   Increase activity slowly   Complete by: As directed        The results of significant diagnostics from this hospitalization (including imaging, microbiology, ancillary and laboratory) are listed below for reference.    Significant Diagnostic Studies: DG Chest Port 1 View  Result Date: 07/24/2021 CLINICAL DATA:  Dizziness, low oxygen, headaches. EXAM: PORTABLE CHEST 1 VIEW COMPARISON:  Chest x-ray 06/25/2020, CT chest 04/24/2007 FINDINGS: The heart and mediastinal contours are unchanged. Coronary artery stent. Cardiac surgical changes overlie the mediastinum. Bibasilar atelectasis. No focal consolidation. No pulmonary edema. No pleural effusion. No pneumothorax. No acute osseous abnormality. IMPRESSION: No active disease. Electronically Signed   By: Iven Finn M.D.   On: 07/24/2021 15:39    Microbiology: Recent Results (from the past 240 hour(s))  Resp Panel by RT-PCR (Flu A&B, Covid) Nasopharyngeal Swab     Status: None   Collection Time: 07/24/21  2:40 PM   Specimen: Nasopharyngeal Swab; Nasopharyngeal(NP) swabs in vial transport medium  Result Value Ref Range Status   SARS Coronavirus 2 by RT PCR NEGATIVE NEGATIVE Final    Comment: (NOTE) SARS-CoV-2 target nucleic acids are NOT DETECTED.  The SARS-CoV-2 RNA is generally detectable in upper respiratory specimens during the acute phase of infection. The lowest concentration of SARS-CoV-2 viral copies this assay can detect is 138 copies/mL. A negative result does not preclude SARS-Cov-2 infection and should not be used as the sole basis for treatment or other patient management decisions. A negative result may occur with  improper specimen  collection/handling, submission of specimen other than nasopharyngeal swab, presence of viral mutation(s) within the areas targeted by this assay, and inadequate number of viral copies(<138 copies/mL). A negative result must be combined with clinical observations, patient history, and epidemiological information. The expected result is Negative.  Fact Sheet for Patients:  EntrepreneurPulse.com.au  Fact Sheet for Healthcare Providers:  IncredibleEmployment.be  This test is no t yet approved or cleared by the [place] FDA and  has been authorized for detection and/or diagnosis of SARS-CoV-2 by FDA under an Emergency Use Authorization (EUA). This  EUA will remain  in effect (meaning this test can be used) for the duration of the COVID-19 declaration under Section 564(b)(1) of the Act, 21 U.S.C.section 360bbb-3(b)(1), unless the authorization is terminated  or revoked sooner.       Influenza A by PCR NEGATIVE NEGATIVE Final   Influenza B by PCR NEGATIVE NEGATIVE Final    Comment: (NOTE) The Xpert Xpress SARS-CoV-2/FLU/RSV plus assay is intended as an aid in the diagnosis of influenza from Nasopharyngeal swab specimens and should not be used as a sole basis for treatment. Nasal washings and aspirates are unacceptable for Xpert Xpress SARS-CoV-2/FLU/RSV testing.  Fact Sheet for Patients: EntrepreneurPulse.com.au  Fact Sheet for Healthcare Providers: IncredibleEmployment.be  This test is not yet approved or cleared by the [place] FDA and has been authorized for detection and/or diagnosis of SARS-CoV-2 by FDA under an Emergency Use Authorization (EUA). This EUA will remain in effect (meaning this test can be used) for the duration of the COVID-19 declaration under Section 564(b)(1) of the Act, 21 U.S.C. section 360bbb-3(b)(1), unless the authorization is terminated or revoked.  Performed at [facility], [address] [place], [place] [address]      Labs: CBC: Recent Labs  Lab 07/24/21 1252 07/24/21 2026 07/25/21 0505 07/25/21 1601 07/27/21 0555 07/28/21 0628  WBC 4.7  --  4.0  --  3.7* 5.0  NEUTROABS  --   --   --   --  2.3  --   HGB 6.9* 8.0* 7.3* 8.6* 8.7* 8.9*  HCT 23.9* 26.8* 25.2* 28.5* 28.0* 29.9*  MCV 92.3  --  90.3  --  88.1 87.9  PLT 235  --  216  --  196 [id number]   Basic Metabolic Panel: Recent Labs  Lab 07/24/21 1252 07/25/21 0505 07/27/21 1030 07/28/21 0628  NA 137 137 138 139  K 3.7 3.9 4.2 4.1  CL 107 107 108 106  CO2 '25 24 25 27  '$ GLUCOSE 109* 114* 110* 119*  BUN '10 12 10 11  '$ CREATININE 0.88 0.92 0.92 0.81  CALCIUM 9.0 8.8* 9.0 8.9  MG  --   --   --  2.0  PHOS  --   --   --  3.7   Liver Function Tests: No results for input(s): AST, ALT, ALKPHOS, BILITOT, PROT, ALBUMIN in the last 168 hours. No results for input(s): LIPASE, AMYLASE in the last 168 hours. No results for input(s): AMMONIA in the last 168 hours. Cardiac Enzymes: No results for input(s): CKTOTAL, CKMB, CKMBINDEX, TROPONINI in the last 168 hours. BNP (last 3 results) No results for input(s): BNP in the last 8760 hours. CBG: Recent Labs  Lab 07/25/21 1142 07/25/21 1636 07/26/21 1049 07/26/21 1314 07/27/21 1235  GLUCAP 148* 134* 104* 99 101*    Time spent: 35 minutes  Signed:  Val Riles  [facility]  07/28/2021 11:44 AM

## 2021-07-28 NOTE — Care Management Important Message (Signed)
Important Message  Patient Details  Name: Craig York MRN: [id number] Date of Birth: October 10, 1951   Medicare Important Message Given:  Yes     Dannette Barbara 07/28/2021, 1:44 PM

## 2021-07-28 NOTE — Anesthesia Postprocedure Evaluation (Signed)
Anesthesia Post Note  Patient: Craig York  Procedure(s) Performed: ESOPHAGOGASTRODUODENOSCOPY (EGD)  Patient location during evaluation: PACU Anesthesia Type: General Level of consciousness: awake and alert Pain management: pain level controlled Vital Signs Assessment: post-procedure vital signs reviewed and stable Respiratory status: spontaneous breathing, nonlabored ventilation, respiratory function stable and patient connected to nasal cannula oxygen Cardiovascular status: blood pressure returned to baseline and stable Postop Assessment: no apparent nausea or vomiting Anesthetic complications: no   No notable events documented.   Last Vitals:  Vitals:   07/27/21 2049 07/28/21 0429  BP: 137/72 127/76  Pulse: 70 73  Resp: 20 20  Temp: 37.1 C 36.9 C  SpO2: 100% 100%    Last Pain:  Vitals:   07/28/21 0429  TempSrc: Oral  PainSc:                  Molli Barrows

## 2021-07-28 NOTE — Progress Notes (Signed)
Pt discharged home with all instructions provided and questions answered.   BP 136/83 (BP Location: Right Arm)   Pulse 76   Temp 97.9 F (36.6 C) (Oral)   Resp 16   Ht '6\' 1"'$  (1.854 m)   Wt 83.3 kg   SpO2 100%   BMI 24.23 kg/m

## 2021-07-28 NOTE — Plan of Care (Signed)
  Problem: Education: Goal: Knowledge of General Education information will improve Description: Including pain rating scale, medication(s)/side effects and non-pharmacologic comfort measures Outcome: Completed/Met   Problem: Health Behavior/Discharge Planning: Goal: Ability to manage health-related needs will improve Outcome: Completed/Met   Problem: Clinical Measurements: Goal: Ability to maintain clinical measurements within normal limits will improve Outcome: Completed/Met Goal: Will remain free from infection Outcome: Completed/Met Goal: Diagnostic test results will improve Outcome: Completed/Met Goal: Respiratory complications will improve Outcome: Completed/Met Goal: Cardiovascular complication will be avoided Outcome: Completed/Met   Problem: Activity: Goal: Risk for activity intolerance will decrease Outcome: Completed/Met   Problem: Nutrition: Goal: Adequate nutrition will be maintained Outcome: Completed/Met   Problem: Coping: Goal: Level of anxiety will decrease Outcome: Completed/Met   Problem: Elimination: Goal: Will not experience complications related to bowel motility Outcome: Completed/Met Goal: Will not experience complications related to urinary retention Outcome: Completed/Met   Problem: Pain Managment: Goal: General experience of comfort will improve Outcome: Completed/Met   Problem: Safety: Goal: Ability to remain free from injury will improve Outcome: Completed/Met   Problem: Skin Integrity: Goal: Risk for impaired skin integrity will decrease Outcome: Completed/Met   Problem: Education: Goal: Ability to identify signs and symptoms of gastrointestinal bleeding will improve Outcome: Completed/Met   Problem: Bowel/Gastric: Goal: Will show no signs and symptoms of gastrointestinal bleeding Outcome: Completed/Met   Problem: Fluid Volume: Goal: Will show no signs and symptoms of excessive bleeding Outcome: Completed/Met   Problem:  Clinical Measurements: Goal: Complications related to the disease process, condition or treatment will be avoided or minimized Outcome: Completed/Met

## 2021-08-04 ENCOUNTER — Telehealth: Payer: Self-pay | Admitting: Family Medicine

## 2021-08-04 NOTE — Telephone Encounter (Signed)
Apt 08/12/2021 at 11:20.  Pt advised.   Thanks,   -Mickel Baas

## 2021-08-04 NOTE — Telephone Encounter (Signed)
Patient was hospitalized for GI bleed last week and needs hospital follow up sometime within the next week. He can have any same day or any slot reserved from acute visits. OK for 20 minutes slot if that is all that is available.

## 2021-08-12 ENCOUNTER — Ambulatory Visit (INDEPENDENT_AMBULATORY_CARE_PROVIDER_SITE_OTHER): Payer: Medicare Other | Admitting: Family Medicine

## 2021-08-12 ENCOUNTER — Other Ambulatory Visit: Payer: Self-pay

## 2021-08-12 ENCOUNTER — Encounter: Payer: Self-pay | Admitting: Family Medicine

## 2021-08-12 VITALS — BP 143/83 | HR 66 | Temp 97.9°F | Resp 18 | Wt 190.6 lb

## 2021-08-12 DIAGNOSIS — R809 Proteinuria, unspecified: Secondary | ICD-10-CM

## 2021-08-12 DIAGNOSIS — D509 Iron deficiency anemia, unspecified: Secondary | ICD-10-CM

## 2021-08-12 DIAGNOSIS — M5416 Radiculopathy, lumbar region: Secondary | ICD-10-CM

## 2021-08-12 DIAGNOSIS — E1129 Type 2 diabetes mellitus with other diabetic kidney complication: Secondary | ICD-10-CM | POA: Diagnosis not present

## 2021-08-12 MED ORDER — OXYCODONE-ACETAMINOPHEN 10-325 MG PO TABS: 1.0000 | ORAL_TABLET | Freq: Three times a day (TID) | ORAL | 0 refills | Status: DC | PRN

## 2021-08-12 NOTE — Progress Notes (Signed)
Established patient visit   Patient: Craig York   DOB: [birth year]   70 y.o. Male  MRN: [id number] Visit Date: 08/12/2021  Today's healthcare provider: Lelon Huh, MD   Chief Complaint  Patient presents with   Hospitalization Follow-up    Subjective    HPI  Follow up Hospitalization  Patient was admitted to [facility] on 07/24/2021 and discharged on 07/28/2021. He was treated for symptomatic anemia, GI bleed, shortness of breath and dizziness. Treatment for this included PRBC transfusion. Metoprolol was held due to soft blood pressure risk of hypotension. Patient later resumed all home medications. Upper and lower endoscopy remarkable only for internal and external hemorrhoids. Telephone follow up was not done.  He reports good compliance with treatment. He reports this condition is improved. States he feels much better since having transfusions and has not any blood or black stool since discharge. He is scheduled to see Dr. Marius Ditch for follow up in October.   Last CBC Lab Results  Component Value Date   WBC 5.0 07/28/2021   HGB 8.9 (L) 07/28/2021   HCT 29.9 (L) 07/28/2021   MCV 87.9 07/28/2021   MCH 26.2 07/28/2021   RDW 14.0 07/28/2021   PLT 207 07/28/2021    -----------------------------------------------------------------------------------------     Medications: Outpatient Medications Prior to Visit  Medication Sig   acetaminophen (TYLENOL) 500 MG tablet Take 1,000 mg by mouth every 6 (six) hours as needed for moderate pain.   amLODipine (NORVASC) 10 MG tablet Take 1 tablet (10 mg total) by mouth daily.   aspirin EC 81 MG tablet Take 81 mg by mouth daily.   empagliflozin (JARDIANCE) 25 MG TABS tablet Take 1 tablet (25 mg total) by mouth daily before breakfast.   ferrous sulfate 325 (65 FE) MG tablet Take 1 tablet (325 mg total) by mouth 2 (two) times daily with a meal.   losartan (COZAAR) 50 MG tablet Take 50 mg by mouth daily.   metFORMIN (GLUCOPHAGE) 500  MG tablet Take 2 tablets (1,000 mg total) by mouth daily.   metoprolol succinate (TOPROL-XL) 25 MG 24 hr tablet Take 1 tablet (25 mg total) by mouth daily.   oxyCODONE-acetaminophen (PERCOCET) 10-325 MG tablet Take 1 tablet by mouth every 8 (eight) hours as needed for pain.   pantoprazole (PROTONIX) 40 MG tablet Take 1 tablet (40 mg total) by mouth daily.   rosuvastatin (CRESTOR) 20 MG tablet TAKE 1 TABLET BY MOUTH  DAILY   SYSTANE COMPLETE 0.6 % SOLN Place 1 drop into both eyes 3 (three) times daily as needed (dry/irritated eyes.).   tadalafil (CIALIS) 20 MG tablet Take 1 tablet (20 mg total) by mouth daily as needed for erectile dysfunction.   Vitamin D, Ergocalciferol, (DRISDOL) 1.25 MG (50000 UNIT) CAPS capsule Take 1 capsule (50,000 Units total) by mouth every 7 (seven) days.   No facility-administered medications prior to visit.    Review of Systems  Constitutional:  Negative for appetite change, chills and fever.  Respiratory:  Negative for chest tightness, shortness of breath and wheezing.   Cardiovascular:  Negative for chest pain and palpitations.  Gastrointestinal:  Negative for abdominal pain, nausea and vomiting.      Objective    BP (!) 143/83 (BP Location: Right Arm, Patient Position: Sitting, Cuff Size: Normal)   Pulse 66   Temp 97.9 F (36.6 C) (Temporal)   Resp 18   Wt 190 lb 9.6 oz (86.5 kg)   SpO2 98% Comment: room air  BMI 25.15 kg/m  {Show previous vital signs (optional):23777}  Physical Exam    General: Appearance:     Well developed, well nourished male in no acute distress  Eyes:    PERRL, conjunctiva/corneas clear, EOM's intact       Lungs:     Clear to auscultation bilaterally, respirations unlabored  Heart:    Normal heart rate. Normal rhythm. No murmurs, rubs, or gallops.    MS:   All extremities are intact.    Neurologic:   Awake, alert, oriented x 3. No apparent focal neurological defect.         Assessment & Plan     1. Iron deficiency  anemia, unspecified iron deficiency anemia type Only GI finding was single gastric polyp and hemorrhoids with fresh blood. He feels much better since transfusion.  - CBC - Comprehensive metabolic panel  2. Type 2 diabetes mellitus with microalbuminuria, without long-term current use of insulin ([place]) He reports home sugars have been very good and doing well with medications except for expense of Jardiance.  - Hemoglobin A1c  3. Lumbar radiculopathy refill- oxyCODONE-acetaminophen (PERCOCET) 10-325 MG tablet; Take 1 tablet by mouth every 8 (eight) hours as needed for pain.  Dispense: 30 tablet; Refill: 0   Future Appointments  Date Time Provider [place]  09/09/2021  9:30 AM Lin Landsman, MD [facility] None  12/21/2021 10:00 AM Caryn Section Kirstie Peri, MD [facility]      Recommended flu vaccine which he declined.      The entirety of the information documented in the History of Present Illness, Review of Systems and Physical Exam were personally obtained by me. Portions of this information were initially documented by the CMA and reviewed by me for thoroughness and accuracy.     Lelon Huh, MD  [facility] [phone] (phone) [phone] (fax)  [place]

## 2021-08-15 DIAGNOSIS — R809 Proteinuria, unspecified: Secondary | ICD-10-CM | POA: Diagnosis not present

## 2021-08-15 DIAGNOSIS — E1129 Type 2 diabetes mellitus with other diabetic kidney complication: Secondary | ICD-10-CM | POA: Diagnosis not present

## 2021-08-15 DIAGNOSIS — D509 Iron deficiency anemia, unspecified: Secondary | ICD-10-CM | POA: Diagnosis not present

## 2021-08-16 LAB — COMPREHENSIVE METABOLIC PANEL
ALT: 18 IU/L (ref 0–44)
AST: 18 IU/L (ref 0–40)
Albumin/Globulin Ratio: 2.2 (ref 1.2–2.2)
Albumin: 4.6 g/dL (ref 3.8–4.8)
Alkaline Phosphatase: 63 IU/L (ref 44–121)
BUN/Creatinine Ratio: 11 (ref 10–24)
BUN: 10 mg/dL (ref 8–27)
Bilirubin Total: 0.5 mg/dL (ref 0.0–1.2)
CO2: 21 mmol/L (ref 20–29)
Calcium: 9.5 mg/dL (ref 8.6–10.2)
Chloride: 105 mmol/L (ref 96–106)
Creatinine, Ser: 0.95 mg/dL (ref 0.76–1.27)
Globulin, Total: 2.1 g/dL (ref 1.5–4.5)
Glucose: 161 mg/dL — ABNORMAL HIGH (ref 65–99)
Potassium: 4.3 mmol/L (ref 3.5–5.2)
Sodium: 140 mmol/L (ref 134–144)
Total Protein: 6.7 g/dL (ref 6.0–8.5)
eGFR: 87 mL/min/{1.73_m2} (ref >59)

## 2021-08-16 LAB — CBC
Hematocrit: 28.9 % — ABNORMAL LOW (ref 37.5–51.0)
Hemoglobin: 9 g/dL — ABNORMAL LOW (ref 13.0–17.7)
MCH: 26.4 pg — ABNORMAL LOW (ref 26.6–33.0)
MCHC: 31.1 g/dL — ABNORMAL LOW (ref 31.5–35.7)
MCV: 85 fL (ref 79–97)
Platelets: 243 10*3/uL (ref 150–450)
RBC: 3.41 x10E6/uL — ABNORMAL LOW (ref 4.14–5.80)
RDW: 14.3 % (ref 11.6–15.4)
WBC: 3.5 10*3/uL (ref 3.4–10.8)

## 2021-08-16 LAB — HEMOGLOBIN A1C
Est. average glucose Bld gHb Est-mCnc: 100 mg/dL
Hgb A1c MFr Bld: 5.1 % (ref 4.8–5.6)

## 2021-08-22 ENCOUNTER — Ambulatory Visit: Payer: Self-pay | Admitting: Family Medicine

## 2021-09-09 ENCOUNTER — Other Ambulatory Visit: Payer: Self-pay | Admitting: Family Medicine

## 2021-09-09 ENCOUNTER — Ambulatory Visit: Payer: Medicare Other | Admitting: Gastroenterology

## 2021-09-09 DIAGNOSIS — M5416 Radiculopathy, lumbar region: Secondary | ICD-10-CM

## 2021-09-09 DIAGNOSIS — E1129 Type 2 diabetes mellitus with other diabetic kidney complication: Secondary | ICD-10-CM

## 2021-09-09 DIAGNOSIS — R809 Proteinuria, unspecified: Secondary | ICD-10-CM

## 2021-09-09 NOTE — Telephone Encounter (Signed)
Requested medications are due for refill today Gabapentin too soon   Requested medications are on the active medication list yes  Last visit 08/12/21  Future visit scheduled 12/21/20  Notes to clinic Percocet not delegated, Glucophage too soon.  Requested Prescriptions  Pending Prescriptions Disp Refills   oxyCODONE-acetaminophen (PERCOCET) 10-325 MG tablet 30 tablet 0    Sig: Take 1 tablet by mouth every 8 (eight) hours as needed for pain.     Not Delegated - Analgesics:  Opioid Agonist Combinations Failed - 09/09/2021  2:44 PM      Failed - This refill cannot be delegated      Failed - Urine Drug Screen completed in last 360 days      Passed - Valid encounter within last 6 months    Recent Outpatient Visits           4 weeks ago Iron deficiency anemia, unspecified iron deficiency anemia type   [facility] [name], MD   4 months ago Type 2 diabetes mellitus with microalbuminuria, without long-term current use of insulin [facility])   [facility] [name], MD   8 months ago Type 2 diabetes mellitus with microalbuminuria, without long-term current use of insulin [facility])   [facility] [name], MD   1 year ago Type 2 diabetes mellitus with microalbuminuria, without long-term current use of insulin [facility])   [facility] [name], MD   1 year ago Iron deficiency anemia due to chronic blood loss   [place], MD               metFORMIN (GLUCOPHAGE) 500 MG tablet 90 tablet 2    Sig: Take 2 tablets (1,000 mg total) by mouth daily.     Endocrinology:  Diabetes - Biguanides Failed - 09/09/2021  2:44 PM      Failed - AA eGFR in normal range and within 360 days    GFR calc Af Amer  Date Value Ref Range Status  05/08/2020 >60 >60 mL/min Final   GFR, Estimated  Date Value Ref Range Status  07/28/2021 >60 >60 mL/min Final    Comment:    (NOTE) Calculated using  the CKD-EPI Creatinine Equation (2021)    eGFR  Date Value Ref Range Status  08/15/2021 87 >59 mL/min/1.73 Final          Passed - Cr in normal range and within 360 days    Creatinine, Ser  Date Value Ref Range Status  08/15/2021 0.95 0.76 - 1.27 mg/dL Final   Creatinine, POC  Date Value Ref Range Status  11/06/2017 n/a mg/dL Final          Passed - HBA1C is between 0 and 7.9 and within 180 days    Hgb A1c MFr Bld  Date Value Ref Range Status  08/15/2021 5.1 4.8 - 5.6 % Final    Comment:             Prediabetes: 5.7 - 6.4          Diabetes: >6.4          Glycemic control for adults with diabetes: <7.0           Passed - Valid encounter within last 6 months    Recent Outpatient Visits           4 weeks ago Iron deficiency anemia, unspecified iron deficiency anemia type   [facility] [name],  MD   4 months ago Type 2 diabetes mellitus with microalbuminuria, without long-term current use of insulin [facility])   [facility] [name], MD   8 months ago Type 2 diabetes mellitus with microalbuminuria, without long-term current use of insulin [facility])   [facility] [name], MD   1 year ago Type 2 diabetes mellitus with microalbuminuria, without long-term current use of insulin [facility])   [facility] [name], MD   1 year ago Iron deficiency anemia due to chronic blood loss   [facility] [name], MD

## 2021-09-09 NOTE — Telephone Encounter (Signed)
Medication Refill - Medication: Oxycodone 10/325,  Metformin 500 mg  Has the patient contacted their pharmacy? Yes.   (Agent: If no, request that the patient contact the pharmacy for the refill.) (Agent: If yes, when and what did the pharmacy advise?)  Preferred Pharmacy (with phone number or street name): [facility] Has the patient been seen for an appointment in the last year OR does the patient have an upcoming appointment? Yes.    Agent: Please be advised that RX refills may take up to 3 business days. We ask that you follow-up with your pharmacy.

## 2021-09-10 MED ORDER — METFORMIN HCL 500 MG PO TABS: 1000.0000 mg | ORAL_TABLET | Freq: Every day | ORAL | 4 refills | Status: DC

## 2021-09-10 MED ORDER — OXYCODONE-ACETAMINOPHEN 10-325 MG PO TABS: 1.0000 | ORAL_TABLET | Freq: Three times a day (TID) | ORAL | 0 refills | Status: DC | PRN

## 2021-10-10 ENCOUNTER — Other Ambulatory Visit: Payer: Self-pay | Admitting: Family Medicine

## 2021-10-10 DIAGNOSIS — M5416 Radiculopathy, lumbar region: Secondary | ICD-10-CM

## 2021-10-10 MED ORDER — PANTOPRAZOLE SODIUM 40 MG PO TBEC: 40.0000 mg | DELAYED_RELEASE_TABLET | Freq: Every day | ORAL | 0 refills | Status: DC

## 2021-10-10 NOTE — Telephone Encounter (Signed)
Medication Refill - Medication: Pantoprazole, Oxycodone   Has the patient contacted their pharmacy? Yes.   Pt states that the pharmacy does not have any refills on file. Pt states that he is completely out of oxycodone. Please advise.  (Agent: If no, request that the patient contact the pharmacy for the refill. If patient does not wish to contact the pharmacy document the reason why and proceed with request.) (Agent: If yes, when and what did the pharmacy advise?)  Preferred Pharmacy (with phone number or street name):  [place] ([address] [place] [address]  [place] [address]  Phone: [phone] Fax: [phone]  Hours: Not open 24 hours   Has the patient been seen for an appointment in the last year OR does the patient have an upcoming appointment? Yes.    Agent: Please be advised that RX refills may take up to 3 business days. We ask that you follow-up with your pharmacy.

## 2021-10-10 NOTE — Telephone Encounter (Signed)
LOV:  08/12/2021   Thanks,   -Mickel Baas

## 2021-10-10 NOTE — Telephone Encounter (Signed)
Requested medication (s) are due for refill today: yes  Requested medication (s) are on the active medication list: yes  Last refill:  09/10/21 #30/0 RF  Future visit scheduled: no  Notes to clinic:  Unable to refill per protocol, cannot delegate.      Requested Prescriptions  Pending Prescriptions Disp Refills   oxyCODONE-acetaminophen (PERCOCET) 10-325 MG tablet 30 tablet 0    Sig: Take 1 tablet by mouth every 8 (eight) hours as needed for pain.     Not Delegated - Analgesics:  Opioid Agonist Combinations Failed - 10/10/2021  2:54 PM      Failed - This refill cannot be delegated      Failed - Urine Drug Screen completed in last 360 days      Passed - Valid encounter within last 6 months    Recent Outpatient Visits           1 month ago Iron deficiency anemia, unspecified iron deficiency anemia type   [facility] [name], MD   5 months ago Type 2 diabetes mellitus with microalbuminuria, without long-term current use of insulin [facility])   [facility] [name], MD   9 months ago Type 2 diabetes mellitus with microalbuminuria, without long-term current use of insulin [facility])   [facility] [name], MD   1 year ago Type 2 diabetes mellitus with microalbuminuria, without long-term current use of insulin [facility])   [facility] [name], MD   1 year ago Iron deficiency anemia due to chronic blood loss   [place], MD              Signed Prescriptions Disp Refills   pantoprazole (PROTONIX) 40 MG tablet 90 tablet 0    Sig: Take 1 tablet (40 mg total) by mouth daily.     Gastroenterology: Proton Pump Inhibitors Passed - 10/10/2021  2:54 PM      Passed - Valid encounter within last 12 months    Recent Outpatient Visits           1 month ago Iron deficiency anemia, unspecified iron deficiency anemia type   [facility] [name], MD    5 months ago Type 2 diabetes mellitus with microalbuminuria, without long-term current use of insulin [facility])   [facility] [name], MD   9 months ago Type 2 diabetes mellitus with microalbuminuria, without long-term current use of insulin [facility])   [facility] [name], MD   1 year ago Type 2 diabetes mellitus with microalbuminuria, without long-term current use of insulin [facility]   [facility] [name], MD   1 year ago Iron deficiency anemia due to chronic blood loss   [facility] [name], MD

## 2021-10-10 NOTE — Telephone Encounter (Signed)
Requested Prescriptions  Pending Prescriptions Disp Refills  . oxyCODONE-acetaminophen (PERCOCET) 10-325 MG tablet 30 tablet 0    Sig: Take 1 tablet by mouth every 8 (eight) hours as needed for pain.     Not Delegated - Analgesics:  Opioid Agonist Combinations Failed - 10/10/2021  2:54 PM      Failed - This refill cannot be delegated      Failed - Urine Drug Screen completed in last 360 days      Passed - Valid encounter within last 6 months    Recent Outpatient Visits          1 month ago Iron deficiency anemia, unspecified iron deficiency anemia type   [facility] [name], MD   5 months ago Type 2 diabetes mellitus with microalbuminuria, without long-term current use of insulin [facility])   [facility] [name], MD   9 months ago Type 2 diabetes mellitus with microalbuminuria, without long-term current use of insulin [facility])   [facility] [name], MD   1 year ago Type 2 diabetes mellitus with microalbuminuria, without long-term current use of insulin [facility])   [facility] [name], MD   1 year ago Iron deficiency anemia due to chronic blood loss   [facility] [name], MD             . pantoprazole (PROTONIX) 40 MG tablet 90 tablet 0    Sig: Take 1 tablet (40 mg total) by mouth daily.     Gastroenterology: Proton Pump Inhibitors Passed - 10/10/2021  2:54 PM      Passed - Valid encounter within last 12 months    Recent Outpatient Visits          1 month ago Iron deficiency anemia, unspecified iron deficiency anemia type   [facility] [name], MD   5 months ago Type 2 diabetes mellitus with microalbuminuria, without long-term current use of insulin [facility])   [facility] [name], MD   9 months ago Type 2 diabetes mellitus with microalbuminuria, without long-term current use of insulin [facility])   [facility]  [name], MD   1 year ago Type 2 diabetes mellitus with microalbuminuria, without long-term current use of insulin [facility])   [facility] [name], MD   1 year ago Iron deficiency anemia due to chronic blood loss   [facility] [name], MD

## 2021-10-11 ENCOUNTER — Ambulatory Visit: Payer: Medicare Other | Admitting: Gastroenterology

## 2021-10-11 MED ORDER — OXYCODONE-ACETAMINOPHEN 10-325 MG PO TABS: 1.0000 | ORAL_TABLET | Freq: Three times a day (TID) | ORAL | 0 refills | Status: DC | PRN

## 2021-10-31 ENCOUNTER — Other Ambulatory Visit: Payer: Self-pay

## 2021-10-31 ENCOUNTER — Ambulatory Visit: Payer: Medicare Other | Admitting: Family Medicine

## 2021-10-31 DIAGNOSIS — R809 Proteinuria, unspecified: Secondary | ICD-10-CM

## 2021-10-31 NOTE — Telephone Encounter (Signed)
Is it okay to leave samples if available?   Thanks,   -Mickel Baas

## 2021-10-31 NOTE — Telephone Encounter (Signed)
He can have 1 months samples if we have them

## 2021-10-31 NOTE — Telephone Encounter (Signed)
Copied from [place] [id number]. Topic: General - Inquiry >> Oct 31, 2021  9:05 AM Loma Boston wrote: empagliflozin (JARDIANCE) 25 MG TABS tablet 90 tablet 1 07/13/2021   Sig - Route: Take 1 tablet (25 mg total) by mouth daily before breakfast. - Oral  Sent to pharmacy as: empagliflozin (JARDIANCE) 25 MG Tab tablet  Notes to Pharmacy: Requesting 1 year supply  E-Prescribing Status: Receipt confirmed by pharmacy (07/13/2021    Patient ins does not kick back in for this med coverage above till first of year, FU to advise as request is samples.   FU [phone] either way

## 2021-11-01 NOTE — Telephone Encounter (Signed)
He can have six bottles of the 10mg  tablets, he can just take 2 tablets once a day.

## 2021-11-01 NOTE — Telephone Encounter (Signed)
Advised pt that we don't have 25mg  Jardiance.    He states he only has a few more pills left and his insurance won't cover Jardiance until after the first of the year.  We do have 10mg  samples here.  Would it be okay for him to take 2 1/2 daily to get him to the first of the year?   Thanks,   -Mickel Baas

## 2021-11-01 NOTE — Telephone Encounter (Signed)
Pt advised.   Thanks,   -Latanya Hemmer  

## 2021-11-03 ENCOUNTER — Telehealth: Payer: Self-pay

## 2021-11-03 DIAGNOSIS — Z596 Low income: Secondary | ICD-10-CM

## 2021-11-03 NOTE — Progress Notes (Signed)
[facility])  [place] Team    11/03/2021  DORSEY AUTHEMENT [birth year] [id number]  Reason for referral: Medication Assistance, Medication Adherence  Referral source:  [place] Technician Current insurance: [facility]  Outreach:  Successful telephone call with Mr/ Alroy Dust and Debera Lat (niece who manages cares).  HIPAA identifiers verified.   Subjective:  Debera Lat called yesterday, and I was returning her call - confirmed HIPAA identifiers. Called Mr. Dia on 3-way and confirmed HIPAA identifiers.   Melissa reports her uncle monthly income is $1400 (yearly income <150% FPL). Of note, patient previously stated he makes ~$19,000/year. Melissa has applied for LIS twice for patient and was denied; the last time she submitted the application was October 2022. She did not state why he was denied. Mr. Cleopatra Cedar currently reports having three tablets of Jaridance remaining and it cost $45 each month. Given patient's income, Lenna Sciara states $45 is difficult to afford when considering patient's monthly expenses. Informed Mr. Tal and Lenna Sciara that I will reach out to Dr. Caryn Section for available SGLT2i samples for this month. Given patient was already denied LIS, will forward application to pharmacy technician at this time.  Informed Melissa to hold onto LIS denial for Jardiance PAP.    Objective: The ASCVD Risk score (Arnett DK, et al., 2019) failed to calculate for the following reasons:   The patient has a prior MI or stroke diagnosis  Lab Results  Component Value Date   CREATININE 0.95 08/15/2021   CREATININE 0.81 07/28/2021   CREATININE 0.92 07/27/2021    Lab Results  Component Value Date   HGBA1C 5.1 08/15/2021    Lipid Panel     Component Value Date/Time   CHOL 126 04/19/2021 1121   TRIG 158 (H) 04/19/2021 1121   HDL 37 (L) 04/19/2021 1121   CHOLHDL 3.4 04/19/2021 1121   CHOLHDL 5.5 02/05/2008 0355   VLDL 23 02/05/2008  0355   LDLCALC 62 04/19/2021 1121    BP Readings from Last 3 Encounters:  08/12/21 (!) 143/83  07/28/21 136/83  04/18/21 121/73    Allergies  Allergen Reactions   Simvastatin     Elevated CK   Penicillins Rash    Did it involve swelling of the face/tongue/throat, SOB, or low BP? No Did it involve sudden or severe rash/hives, skin peeling, or any reaction on the inside of your mouth or nose? No Did you need to seek medical attention at a hospital or doctor's office? No When did it last happen?       If all above answers are "NO", may proceed with cephalosporin use.     Medications Reviewed Today     Reviewed by Randal Buba, CMA (Certified Medical Assistant) on 08/12/21 at 1129  Med List Status: <None>   Medication Order Taking? Sig Documenting Provider Last Dose Status Informant  acetaminophen (TYLENOL) 500 MG tablet [id number] Yes Take 1,000 mg by mouth every 6 (six) hours as needed for moderate pain. [provider] Taking Active Self  amLODipine (NORVASC) 10 MG tablet [id number] Yes Take 1 tablet (10 mg total) by mouth daily. [name], MD Taking Active Self  aspirin EC 81 MG tablet [id number] Yes Take 81 mg by mouth daily. [provider] Taking Active Self  empagliflozin (JARDIANCE) 25 MG TABS tablet [id number] Yes Take 1 tablet (25 mg total) by mouth daily before breakfast. [name], MD Taking Active Self  ferrous sulfate 325 (65 FE) MG tablet [id number] Yes  Take 1 tablet (325 mg total) by mouth 2 (two) times daily with a meal. Sharen Hones, MD Taking Active Self  losartan (COZAAR) 50 MG tablet [id number] Yes Take 50 mg by mouth daily. [provider] Taking Active Self  metFORMIN (GLUCOPHAGE) 500 MG tablet [id number] Yes Take 2 tablets (1,000 mg total) by mouth daily. [name], MD Taking Active   metoprolol succinate (TOPROL-XL) 25 MG 24 hr tablet [id number] Yes Take 1 tablet (25 mg total) by mouth daily. [name], MD  Taking Active Self  oxyCODONE-acetaminophen (PERCOCET) 10-325 MG tablet [id number] Yes Take 1 tablet by mouth every 8 (eight) hours as needed for pain. [name], MD Taking Active   pantoprazole (PROTONIX) 40 MG tablet [id number] Yes Take 1 tablet (40 mg total) by mouth daily. [name], MD Taking Active   rosuvastatin (CRESTOR) 20 MG tablet [id number] Yes TAKE 1 TABLET BY MOUTH  DAILY [name], MD Taking Active Self  SYSTANE COMPLETE 0.6 % SOLN [id number] Yes Place 1 drop into both eyes 3 (three) times daily as needed (dry/irritated eyes.). [provider] Taking Active Self  tadalafil (CIALIS) 20 MG tablet [id number] Yes Take 1 tablet (20 mg total) by mouth daily as needed for erectile dysfunction. [name], MD Taking Active Self  Vitamin D, Ergocalciferol, (DRISDOL) 1.25 MG (50000 UNIT) CAPS capsule [id number] Yes Take 1 capsule (50,000 Units total) by mouth every 7 (seven) days. Val Riles, MD Taking Active             Assessment: Patient already has the LIS denial letter. Will need to apply for Jaridance PAP and will not try to re-apply for LIS.     Medication Assistance Findings:  Medication assistance needs identified: Jardiance  Extra Help:  Not eligible for Extra Help Low Income Subsidy based on reported income and assets - per recent denial reported from niece   Patient Assistance Programs: Jardiance made by [employer] requirement met: Yes Out-of-pocket prescription expenditure met:   Not Applicable Patient has met application requirements to apply for this program.     Plan: Will contact Dr. Caryn Section  regarding SGLT2-I medication samples.   Thank you for allowing pharmacy to be a part of this patient's care. Kristeen Miss, PharmD Clinical Pharmacist [place] Cell: [phone]

## 2021-11-08 ENCOUNTER — Telehealth: Payer: Self-pay | Admitting: Pharmacy Technician

## 2021-11-08 DIAGNOSIS — Z596 Low income: Secondary | ICD-10-CM

## 2021-11-08 NOTE — Progress Notes (Signed)
Medication Assistance Referral  Referral From:  [place]  Medication/Company: Vania [employer] / [employer] Patient application portion:  Mailed Provider application portion: Faxed  to Dr. Lelon Huh Provider address/fax verified via: Office website    Romaldo Saville [address] [place], [place]  [phone]

## 2021-11-09 ENCOUNTER — Other Ambulatory Visit: Payer: Self-pay

## 2021-11-09 DIAGNOSIS — M5416 Radiculopathy, lumbar region: Secondary | ICD-10-CM

## 2021-11-09 MED ORDER — OXYCODONE-ACETAMINOPHEN 10-325 MG PO TABS: 1.0000 | ORAL_TABLET | Freq: Three times a day (TID) | ORAL | 0 refills | Status: DC | PRN

## 2021-11-09 NOTE — Telephone Encounter (Signed)
Copied from [place] [id number]. Topic: Quick Communication - Rx Refill/Question >> Nov 09, 2021 10:34 AM Loma Boston wrote: Medication: oxyCODONE-acetaminophen (PERCOCET) 10-325 MG tablet 30 tablet 0 10/11/2021   Sig - Route: Take 1 tablet by mouth every 8 (eight) hours as needed for pain. - Oral  Sent to pharmacy as: oxyCODONE-acetaminophen (PERCOCET) 10-325 MG tablet  Earliest Fill Date: 10/11/2021  E-Prescribing Status: Receipt confirmed by pharmacy (10/11/2021 7:27 AM EST)   Associated Diagnoses    Has the patient contacted their pharmacy?  (Agent: If no, request that the patient contact the pharmacy for the refill. If patient does not wish to contact the pharmacy document the reason why and proceed with request.) (Agent: If yes, when and what did the pharmacy advise?) call dr last seen 9/16  Preferred Pharmacy (with phone number or street name): [facility] [name], [place]  Phone:  [phone]    Has the patient been seen for an appointment in the last year OR does the patient have an upcoming appointment? Yes.    Agent: Please be advised that RX refills may take up to 3 business days. We ask that you follow-up with your pharmacy.

## 2021-11-09 NOTE — Telephone Encounter (Signed)
LOV:   08/12/2021  NOV:  12/21/2021   Thanks,   -Mickel Baas

## 2021-12-02 ENCOUNTER — Telehealth: Payer: Self-pay

## 2021-12-02 DIAGNOSIS — Z596 Low income: Secondary | ICD-10-CM

## 2021-12-02 NOTE — Progress Notes (Signed)
[facility])  [place] Team    12/02/2021  [name] [birth year] [id number]  Reason for referral: Medication Assistance, Medication Adherence  Referral source:  [place] Technician Current insurance: [facility]  Outreach:  Successful telephone call with [name] and Craig York (niece who manages cares).  HIPAA identifiers verified.   Brief Summary:  Craig York and [name] called today - confirmed HIPAA identifiers. He stated that he only has 1-2 pills left today of Craig York 10 mg sample. His niece called and inquired about the application process. His niece received the package and mailed it back early this week. This pharmacist answered all questions but referred patient to call [facility] next week for any additional questions. I also asked pt to contact clinic for Jardiance sample.     Plan: Will contact Dr. Caryn Section regarding additional SGLT2-I medication samples.   Thank you for allowing pharmacy to be a part of this patients care. Kristeen Miss, PharmD Clinical Pharmacist [place] Cell: [phone]

## 2021-12-03 ENCOUNTER — Other Ambulatory Visit: Payer: Self-pay | Admitting: Family Medicine

## 2021-12-03 NOTE — Telephone Encounter (Signed)
Requested Prescriptions  Pending Prescriptions Disp Refills   pantoprazole (PROTONIX) 40 MG tablet [Pharmacy Med Name: Pantoprazole Sodium 40 MG Oral Tablet Delayed Release] 90 tablet 0    Sig: Take 1 tablet by mouth once daily     Gastroenterology: Proton Pump Inhibitors Passed - 12/03/2021 12:10 PM      Passed - Valid encounter within last 12 months    Recent Outpatient Visits          3 months ago Iron deficiency anemia, unspecified iron deficiency anemia type   [facility] [name], MD   7 months ago Type 2 diabetes mellitus with microalbuminuria, without long-term current use of insulin [facility])   [facility] [name], MD   11 months ago Type 2 diabetes mellitus with microalbuminuria, without long-term current use of insulin [facility])   [facility] [name], MD   1 year ago Type 2 diabetes mellitus with microalbuminuria, without long-term current use of insulin [facility])   [facility] [name], MD   1 year ago Iron deficiency anemia due to chronic blood loss   [facility] [name], MD

## 2021-12-06 ENCOUNTER — Telehealth: Payer: Self-pay | Admitting: Pharmacy Technician

## 2021-12-06 DIAGNOSIS — Z596 Low income: Secondary | ICD-10-CM

## 2021-12-06 NOTE — Progress Notes (Signed)
[facility])                                            [place] Team    12/06/2021  RANKIN COOLMAN 1951-06-01 [id number]  Received both patient and provider portion(s) of patient assistance application(s) for Jardiance. Faxed completed application and required documents into BI. Did not have proof of income to submit.   Khloi Rawl P. Deletha Jaffee, [place]  [phone]

## 2021-12-07 ENCOUNTER — Telehealth: Payer: Self-pay | Admitting: Family Medicine

## 2021-12-07 DIAGNOSIS — E1129 Type 2 diabetes mellitus with other diabetic kidney complication: Secondary | ICD-10-CM

## 2021-12-07 DIAGNOSIS — R809 Proteinuria, unspecified: Secondary | ICD-10-CM

## 2021-12-07 MED ORDER — EMPAGLIFLOZIN 10 MG PO TABS: 10.0000 mg | ORAL_TABLET | Freq: Every day | ORAL | 0 refills | Status: DC

## 2021-12-07 NOTE — Telephone Encounter (Signed)
Patient advised. Samples placed up front for pick up.  

## 2021-12-07 NOTE — Telephone Encounter (Signed)
Please see not from [employer], [place].   We have samples Jardiance. He was on the 25mg , but his A1C has been low. He can have 4 weeks samples of the 10mg  tablets.

## 2021-12-07 NOTE — Patient Instructions (Signed)
.   Please review the attached list of medications and notify my office if there are any errors.   . Please bring all of your medications to every appointment so we can make sure that our medication list is the same as yours.   

## 2021-12-07 NOTE — Progress Notes (Signed)
We have samples Jardiance. He was on the 25mg , but his A1C has been low. He can have 4 weeks samples of the 10mg  tablets.

## 2021-12-08 ENCOUNTER — Other Ambulatory Visit: Payer: Self-pay

## 2021-12-08 DIAGNOSIS — M5416 Radiculopathy, lumbar region: Secondary | ICD-10-CM

## 2021-12-08 MED ORDER — OXYCODONE-ACETAMINOPHEN 10-325 MG PO TABS: 1.0000 | ORAL_TABLET | Freq: Three times a day (TID) | ORAL | 0 refills | Status: DC | PRN

## 2021-12-08 NOTE — Telephone Encounter (Signed)
LOV: 08/12/2021  NOV: 12/21/2021  Last Refill: 11/09/2021  #30 0 Refill  Thanks,   Mickel Baas

## 2021-12-08 NOTE — Telephone Encounter (Signed)
Patient is requesting refill on Oxycodone-Acet 325-10 mg. to be seen to [place]

## 2021-12-12 ENCOUNTER — Telehealth: Payer: Self-pay | Admitting: Family Medicine

## 2021-12-12 ENCOUNTER — Other Ambulatory Visit: Payer: Self-pay | Admitting: Family Medicine

## 2021-12-12 DIAGNOSIS — I10 Essential (primary) hypertension: Secondary | ICD-10-CM

## 2021-12-12 NOTE — Telephone Encounter (Signed)
Medication signed today 12/12/21.

## 2021-12-12 NOTE — Telephone Encounter (Signed)
Medication Refill - Medication:  metoprolol succinate (TOPROL-XL) 25 MG 24 hr tablet   Has the patient contacted their pharmacy? Yes.   Contact PCP  Preferred Pharmacy (with phone number or street name):  [facility] [place] [address]  [place], [place] [address]  Phone:  [phone]  Fax:  [phone]   Has the patient been seen for an appointment in the last year OR does the patient have an upcoming appointment? Yes.    Agent: Please be advised that RX refills may take up to 3 business days. We ask that you follow-up with your pharmacy.

## 2021-12-12 NOTE — Telephone Encounter (Signed)
Requested Prescriptions  Pending Prescriptions Disp Refills   metoprolol succinate (TOPROL-XL) 25 MG 24 hr tablet [Pharmacy Med Name: Metoprolol Succinate ER 25 MG Oral Tablet Extended Release 24 Hour] 90 tablet 0    Sig: Take 1 tablet by mouth once daily     Cardiovascular:  Beta Blockers Failed - 12/12/2021  2:16 PM      Failed - Last BP in normal range    BP Readings from Last 1 Encounters:  08/12/21 (!) 143/83         Passed - Last Heart Rate in normal range    Pulse Readings from Last 1 Encounters:  08/12/21 66         Passed - Valid encounter within last 6 months    Recent Outpatient Visits          4 months ago Iron deficiency anemia, unspecified iron deficiency anemia type   [facility] [name], MD   7 months ago Type 2 diabetes mellitus with microalbuminuria, without long-term current use of insulin [facility])   [facility] [name], MD   11 months ago Type 2 diabetes mellitus with microalbuminuria, without long-term current use of insulin [facility])   [facility] [name], MD   1 year ago Type 2 diabetes mellitus with microalbuminuria, without long-term current use of insulin [facility])   [facility] [name], MD   1 year ago Iron deficiency anemia due to chronic blood loss   [facility] [name], MD

## 2021-12-14 ENCOUNTER — Telehealth: Payer: Self-pay

## 2021-12-14 NOTE — Telephone Encounter (Signed)
Copied from [place] [id number]. Topic: General - Other >> Dec 14, 2021 11:38 AM Yvette Rack wrote: Reason for CRM: Pt stated the Rx for oxyCODONE-acetaminophen (PERCOCET) 10-325 MG tablet needs prior authorization. Pt requests that [employer] be contacted for PA on his Rx.

## 2021-12-21 ENCOUNTER — Ambulatory Visit (INDEPENDENT_AMBULATORY_CARE_PROVIDER_SITE_OTHER): Payer: Medicare HMO | Admitting: Family Medicine

## 2021-12-21 ENCOUNTER — Encounter: Payer: Self-pay | Admitting: Family Medicine

## 2021-12-21 ENCOUNTER — Other Ambulatory Visit: Payer: Self-pay

## 2021-12-21 VITALS — BP 150/88 | HR 68 | Temp 98.5°F | Resp 14 | Ht 73.0 in | Wt 189.0 lb

## 2021-12-21 DIAGNOSIS — R809 Proteinuria, unspecified: Secondary | ICD-10-CM

## 2021-12-21 DIAGNOSIS — I25118 Atherosclerotic heart disease of native coronary artery with other forms of angina pectoris: Secondary | ICD-10-CM | POA: Diagnosis not present

## 2021-12-21 DIAGNOSIS — M5416 Radiculopathy, lumbar region: Secondary | ICD-10-CM | POA: Diagnosis not present

## 2021-12-21 DIAGNOSIS — Z0000 Encounter for general adult medical examination without abnormal findings: Secondary | ICD-10-CM | POA: Diagnosis not present

## 2021-12-21 DIAGNOSIS — E1129 Type 2 diabetes mellitus with other diabetic kidney complication: Secondary | ICD-10-CM | POA: Diagnosis not present

## 2021-12-21 DIAGNOSIS — I10 Essential (primary) hypertension: Secondary | ICD-10-CM

## 2021-12-21 DIAGNOSIS — N529 Male erectile dysfunction, unspecified: Secondary | ICD-10-CM | POA: Diagnosis not present

## 2021-12-21 DIAGNOSIS — E782 Mixed hyperlipidemia: Secondary | ICD-10-CM | POA: Diagnosis not present

## 2021-12-21 MED ORDER — TADALAFIL 20 MG PO TABS: 20.0000 mg | ORAL_TABLET | Freq: Every day | ORAL | 5 refills | Status: DC | PRN

## 2021-12-21 MED ORDER — OXYCODONE-ACETAMINOPHEN 10-325 MG PO TABS: 1.0000 | ORAL_TABLET | Freq: Three times a day (TID) | ORAL | 0 refills | Status: DC | PRN

## 2021-12-21 NOTE — Progress Notes (Signed)
Annual Wellness Visit     Patient: Craig York, Male    DOB: [birth year], 71 y.o.   MRN: [id number] Visit Date: 12/21/2021  Today's Provider: Lelon Huh, MD   Chief Complaint  Patient presents with   Medicare Wellness   Diabetes   Anemia   Hypertension   Back Pain   Subjective    Craig York is a 71 y.o. male who presents today for his Annual Wellness Visit.   Medications: Outpatient Medications Prior to Visit  Medication Sig Note   acetaminophen (TYLENOL) 500 MG tablet Take 1,000 mg by mouth every 6 (six) hours as needed for moderate pain.    aspirin EC 81 MG tablet Take 81 mg by mouth daily.    empagliflozin (JARDIANCE) 10 MG TABS tablet Take 1 tablet (10 mg total) by mouth daily before breakfast.    ferrous sulfate 325 (65 FE) MG tablet Take 1 tablet (325 mg total) by mouth 2 (two) times daily with a meal.    losartan (COZAAR) 50 MG tablet Take 50 mg by mouth daily.    metFORMIN (GLUCOPHAGE) 500 MG tablet Take 2 tablets (1,000 mg total) by mouth daily.    metoprolol succinate (TOPROL-XL) 25 MG 24 hr tablet Take 1 tablet by mouth once daily    pantoprazole (PROTONIX) 40 MG tablet Take 1 tablet by mouth once daily    SYSTANE COMPLETE 0.6 % SOLN Place 1 drop into both eyes 3 (three) times daily as needed (dry/irritated eyes.).    [DISCONTINUED] oxyCODONE-acetaminophen (PERCOCET) 10-325 MG tablet Take 1 tablet by mouth every 8 (eight) hours as needed for pain.    [DISCONTINUED] tadalafil (CIALIS) 20 MG tablet Take 1 tablet (20 mg total) by mouth daily as needed for erectile dysfunction.    [DISCONTINUED] amLODipine (NORVASC) 10 MG tablet Take 1 tablet (10 mg total) by mouth daily. (Patient not taking: Reported on 12/21/2021) 12/21/2021: WAS STOPPED BY DR CALLWOOD LAST YEAR   [DISCONTINUED] rosuvastatin (CRESTOR) 20 MG tablet TAKE 1 TABLET BY MOUTH  DAILY (Patient not taking: Reported on 12/21/2021) 12/21/2021: WAS STOPED BY DR [place]   No  facility-administered medications prior to visit.    Allergies  Allergen Reactions   Simvastatin     Elevated CK   Penicillins Rash    Did it involve swelling of the face/tongue/throat, SOB, or low BP? No Did it involve sudden or severe rash/hives, skin peeling, or any reaction on the inside of your mouth or nose? No Did you need to seek medical attention at a hospital or doctor's office? No When did it last happen?       If all above answers are NO, may proceed with cephalosporin use.     Patient Care Team: [name], MD as PCP - General (Family Medicine) [name], MD as Consulting Physician (Cardiology) Lajuana Matte, MD as Consulting Physician (Thoracic Surgery) Lorelee Cover., MD (Ophthalmology)        Objective     Most recent functional status assessment: In your present state of health, do you have any difficulty performing the following activities: 12/21/2021  Hearing? N  Vision? N  Difficulty concentrating or making decisions? N  Walking or climbing stairs? N  Dressing or bathing? N  Doing errands, shopping? N  Some recent data might be hidden   Most recent fall risk assessment: Fall Risk  12/21/2021  Falls in the past year? 0  Number falls in past yr: 0  Injury with Fall? 0  Risk for fall due to : No Fall Risks  Follow up Falls evaluation completed    Most recent depression screenings: PHQ 2/9 Scores 12/21/2021 04/18/2021  PHQ - 2 Score 0 0  PHQ- 9 Score 0 0   Most recent cognitive screening: No flowsheet data found. Most recent Audit-C alcohol use screening Alcohol Use Disorder Test (AUDIT) 12/21/2021  1. How often do you have a drink containing alcohol? 0  2. How many drinks containing alcohol do you have on a typical day when you are drinking? 0  3. How often do you have six or more drinks on one occasion? 0  AUDIT-C Score 0  Alcohol Brief Interventions/Follow-up -   A score of 3 or more in women, and 4 or more in men  indicates increased risk for alcohol abuse, EXCEPT if all of the points are from question 1   No results found for any visits on 12/21/21.  Assessment & Plan     Annual wellness visit done today including the all of the following: Reviewed patient's Family Medical History Reviewed and updated list of patient's medical providers Assessment of cognitive impairment was done Assessed patient's functional ability Established a written schedule for health screening [place] Completed and Reviewed  Exercise Activities and Dietary recommendations  Goals   None     Immunization History  Administered Date(s) Administered   PFIZER(Purple Top)SARS-COV-2 Vaccination 02/18/2020, 03/10/2020, 09/23/2020   Pneumococcal Polysaccharide-23 08/26/2019   Tdap 05/28/2017    Health Maintenance  Topic Date Due   Zoster Vaccines- Shingrix (1 of 2) Never done   OPHTHALMOLOGY EXAM  02/12/2019   Pneumonia Vaccine 75+ Years old (2 - PCV) 08/25/2020   COVID-19 Vaccine (4 - Booster for Pfizer series) 11/18/2020   FOOT EXAM  04/29/2021   INFLUENZA VACCINE  02/24/2022 (Originally 06/27/2021)   HEMOGLOBIN A1C  02/12/2022   COLONOSCOPY (Pts 45-8yrs Insurance coverage will need to be confirmed)  07/27/2026   TETANUS/TDAP  05/29/2027   Hepatitis C Screening  Completed   HPV VACCINES  Aged Out     Discussed health benefits of physical activity, and encouraged him to engage in regular exercise appropriate for his age and condition.      The entirety of the information documented in the History of Present Illness, Review of Systems and Physical Exam were personally obtained by me. Portions of this information were initially documented by the CMA and reviewed by me for thoroughness and accuracy.     Lelon Huh, MD  [facility] [phone] (phone) [phone] (fax)  [place]

## 2021-12-21 NOTE — Patient Instructions (Addendum)
Please review the attached list of medications and notify my office if there are any errors.   Please bring all of your medications to every appointment so we can make sure that our medication list is the same as yours.   I strongly recommend a Covid bivalent (omicron) booster if you have not yet had one

## 2021-12-21 NOTE — Progress Notes (Signed)
Complete Physical Exam      Patient: Craig York, Male    DOB: Feb 06, 1951, 71 y.o.   MRN: [id number] Visit Date: 12/21/2021  Today's Provider: Lelon Huh, MD    Subjective    Craig York is a 71 y.o. male who presents today for his complete physical examination  He reports consuming a general diet. Home exercise routine includes walking every other day. He generally feels fairly well. He reports sleeping fairly well. He does not have additional problems to discuss today.   HPI Diabetes Mellitus Type II, Follow-up  Lab Results  Component Value Date   HGBA1C 5.1 08/15/2021   HGBA1C 5.8 (A) 04/18/2021   HGBA1C 5.9 (A) 12/27/2020   Wt Readings from Last 3 Encounters:  12/21/21 189 lb (85.7 kg)  08/12/21 190 lb 9.6 oz (86.5 kg)  07/26/21 183 lb 10.3 oz (83.3 kg)   Last seen for diabetes 4 months ago.  Management since then includes continue same treatment. He reports good compliance with treatment. He is not having side effects.  Symptoms: No fatigue No foot ulcerations  No appetite changes No nausea  No paresthesia of the feet  No polydipsia  No polyuria No visual disturbances   No vomiting     Home blood sugar records:  121-130 random glucose check  Episodes of hypoglycemia? No    Current insulin regiment: none Most Recent Eye Exam: < 1 year ago Current exercise: walking Current diet habits: well balanced  Pertinent Labs: Lab Results  Component Value Date   CHOL 126 04/19/2021   HDL 37 (L) 04/19/2021   LDLCALC 62 04/19/2021   TRIG 158 (H) 04/19/2021   CHOLHDL 3.4 04/19/2021   Lab Results  Component Value Date   NA 140 08/15/2021   K 4.3 08/15/2021   CREATININE 0.95 08/15/2021   EGFR 87 08/15/2021   MICROALBUR negative 12/27/2020     ---------------------------------------------------------------------------------------------------   Follow up for iron deficiency anemia:  The patient was last seen for this 4 months ago. Changes  made at last visit include none; patient advised to continue iron supplement.  He reports good compliance with treatment. He feels that condition is Unchanged. He is not having side effects.   -----------------------------------------------------------------------------------------   Follow up for lumbar radiculopathy:  The patient was last seen for this 4 months ago. Changes made at last visit include none; refilled Oxycodone-Acetaminophen.  He reports good compliance with treatment. He feels that condition is Unchanged. He is not having side effects.   -----------------------------------------------------------------------------------------   Hypertension, follow-up  BP Readings from Last 3 Encounters:  12/21/21 (!) 150/88  08/12/21 (!) 143/83  07/28/21 136/83   Wt Readings from Last 3 Encounters:  12/21/21 189 lb (85.7 kg)  08/12/21 190 lb 9.6 oz (86.5 kg)  07/26/21 183 lb 10.3 oz (83.3 kg)     He was last seen for hypertension 8 months ago.  BP at that visit was 121/73. Management since that visit includes continue same medication.  He reports good compliance with treatment. He is not having side effects.  Since last visit, patient states that his cardiologist Dr. Clayborn Bigness stopped the Metoprolol and Amlodipine. Patient says he is now taking Losartan 25m daily. He is following a Regular diet. He is exercising. He does not smoke.  Use of agents associated with hypertension: NSAIDS.   Outside blood pressures are 121/76. Symptoms: No chest pain No chest pressure  No palpitations No syncope  No dyspnea No orthopnea  No  paroxysmal nocturnal dyspnea No lower extremity edema   Pertinent labs: Lab Results  Component Value Date   CHOL 126 04/19/2021   HDL 37 (L) 04/19/2021   LDLCALC 62 04/19/2021   TRIG 158 (H) 04/19/2021   CHOLHDL 3.4 04/19/2021   Lab Results  Component Value Date   NA 140 08/15/2021   K 4.3 08/15/2021   CREATININE 0.95 08/15/2021   EGFR 87  08/15/2021   GLUCOSE 161 (H) 08/15/2021   TSH 1.040 04/09/2020     The ASCVD Risk score (Arnett DK, et al., 2019) failed to calculate for the following reasons:   The patient has a prior MI or stroke diagnosis   ---------------------------------------------------------------------------------------------------    Medications: Outpatient Medications Prior to Visit  Medication Sig   acetaminophen (TYLENOL) 500 MG tablet Take 1,000 mg by mouth every 6 (six) hours as needed for moderate pain.   aspirin EC 81 MG tablet Take 81 mg by mouth daily.   empagliflozin (JARDIANCE) 10 MG TABS tablet Take 1 tablet (10 mg total) by mouth daily before breakfast.   ferrous sulfate 325 (65 FE) MG tablet Take 1 tablet (325 mg total) by mouth 2 (two) times daily with a meal.   losartan (COZAAR) 50 MG tablet Take 50 mg by mouth daily.   metFORMIN (GLUCOPHAGE) 500 MG tablet Take 2 tablets (1,000 mg total) by mouth daily.   metoprolol succinate (TOPROL-XL) 25 MG 24 hr tablet Take 1 tablet by mouth once daily   oxyCODONE-acetaminophen (PERCOCET) 10-325 MG tablet Take 1 tablet by mouth every 8 (eight) hours as needed for pain.   pantoprazole (PROTONIX) 40 MG tablet Take 1 tablet by mouth once daily   SYSTANE COMPLETE 0.6 % SOLN Place 1 drop into both eyes 3 (three) times daily as needed (dry/irritated eyes.).   tadalafil (CIALIS) 20 MG tablet Take 1 tablet (20 mg total) by mouth daily as needed for erectile dysfunction.   amLODipine (NORVASC) 10 MG tablet Take 1 tablet (10 mg total) by mouth daily. (Patient not taking: Reported on 12/21/2021)   rosuvastatin (CRESTOR) 20 MG tablet TAKE 1 TABLET BY MOUTH  DAILY (Patient not taking: Reported on 12/21/2021)   No facility-administered medications prior to visit.    Allergies  Allergen Reactions   Simvastatin     Elevated CK   Penicillins Rash    Did it involve swelling of the face/tongue/throat, SOB, or low BP? No Did it involve sudden or severe rash/hives, skin  peeling, or any reaction on the inside of your mouth or nose? No Did you need to seek medical attention at a hospital or doctor's office? No When did it last happen?       If all above answers are NO, may proceed with cephalosporin use.     Patient Care Team: [name], MD as PCP - General (Family Medicine) [name], MD as Consulting Physician (Cardiology) Lajuana Matte, MD as Consulting Physician (Thoracic Surgery) Lorelee Cover., MD (Ophthalmology)  Review of Systems  Constitutional:  Negative for appetite change, chills, fatigue and fever.  HENT:  Negative for congestion, ear pain, hearing loss, nosebleeds and trouble swallowing.   Eyes:  Positive for redness. Negative for pain and visual disturbance.  Respiratory:  Negative for cough, chest tightness and shortness of breath.   Cardiovascular:  Negative for chest pain, palpitations and leg swelling.  Gastrointestinal:  Negative for abdominal pain, blood in stool, constipation, diarrhea, nausea and vomiting.  Endocrine: Negative for polydipsia, polyphagia and polyuria.  Genitourinary:  Negative for dysuria and flank pain.  Musculoskeletal:  Positive for back pain. Negative for arthralgias, joint swelling, myalgias and neck stiffness.  Skin:  Negative for color change, rash and wound.  Neurological:  Negative for dizziness, tremors, seizures, speech difficulty, weakness, light-headedness and headaches.  Psychiatric/Behavioral:  Negative for behavioral problems, confusion, decreased concentration, dysphoric mood and sleep disturbance. The patient is not nervous/anxious.   All other systems reviewed and are negative.      Objective    Vitals: BP (!) 150/88 (BP Location: Left Arm, Patient Position: Sitting, Cuff Size: Normal)    Pulse 68    Temp 98.5 F (36.9 C) (Oral)    Resp 14    Ht $R'6\' 1"'Pm$  (1.854 m)    Wt 189 lb (85.7 kg)    SpO2 100% Comment: room air   BMI 24.94 kg/m    Physical Exam  General  Appearance:    Well developed, well nourished male. Alert, cooperative, in no acute distress, appears stated age  Head:    Normocephalic, without obvious abnormality, atraumatic  Eyes:    PERRL, conjunctiva/corneas clear, EOM's intact, fundi    benign, both eyes       Ears:    Normal TM's and external ear canals, both ears  Neck:   Supple, symmetrical, trachea midline, no adenopathy;       thyroid:  No enlargement/tenderness/nodules; no carotid   bruit or JVD  Back:     Symmetric, no curvature, ROM normal, no CVA tenderness  Lungs:     Clear to auscultation bilaterally, respirations unlabored  Chest wall:    No tenderness or deformity  Heart:    Normal heart rate. Normal rhythm. No murmurs, rubs, or gallops.  S1 and S2 normal  Abdomen:     Soft, non-tender, bowel sounds active all four quadrants,    no masses, no organomegaly  Genitalia:    deferred  Rectal:    deferred  Extremities:   All extremities are intact. No cyanosis or edema  Pulses:   2+ and symmetric all extremities  Skin:   Skin color, texture, turgor normal, no rashes or lesions  Lymph nodes:   Cervical, supraclavicular, and axillary nodes normal  Neurologic:   CNII-XII intact. Normal strength, sensation and reflexes      throughout      Assessment & Plan      1. Annual physical exam   2. Type 2 diabetes mellitus with microalbuminuria, without long-term current use of insulin (HCC) Doing well with current medications.  - Hemoglobin A1c - Urine Microalbumin w/creat. ratio  3. Atherosclerotic heart disease of native coronary artery with other forms of angina pectoris ([place]) Asymptomatic. Compliant with medication.  Continue aggressive risk factor modification.  Continue routine follow up Dr. Clayborn Bigness.  - CBC - Comprehensive metabolic panel  4. Mixed hyperlipidemia Had adverse reaction to simvastatin and he states Dr. Clayborn Bigness had him stop the simvastatin last year, but I don't see a record of that, but dispense  history shows was last dispensed on 08/23/2021.   - Lipid panel  5. Essential (primary) hypertension Amlodipine was changed to losartan at his visit with dr. Clayborn Bigness last year, however bp is not at goal. He would probably tolerate adding back amlodipine at a lower dose if labs are normal.   6. Lumbar radiculopathy He has been taking oxycodone/apap twice a day most days for several months will increase quantity - oxyCODONE-acetaminophen (PERCOCET) 10-325 MG tablet; Take 1 tablet by mouth every  8 (eight) hours as needed for pain. MAY DISPENSED TODAY 12/21/2021  Dispense: 60 tablet; Refill: 0  7. ED (erectile dysfunction) of organic origin refill - tadalafil (CIALIS) 20 MG tablet; Take 1 tablet (20 mg total) by mouth daily as needed for erectile dysfunction.  Dispense: 10 tablet; Refill: 5      The entirety of the information documented in the History of Present Illness, Review of Systems and Physical Exam were personally obtained by me. Portions of this information were initially documented by the CMA and reviewed by me for thoroughness and accuracy.     Lelon Huh, MD  [facility] [phone] (phone) [phone] (fax)  [place]

## 2021-12-22 LAB — COMPREHENSIVE METABOLIC PANEL
ALT: 18 IU/L (ref 0–44)
AST: 16 IU/L (ref 0–40)
Albumin/Globulin Ratio: 1.9 (ref 1.2–2.2)
Albumin: 5 g/dL — ABNORMAL HIGH (ref 3.8–4.8)
Alkaline Phosphatase: 67 IU/L (ref 44–121)
BUN/Creatinine Ratio: 15 (ref 10–24)
BUN: 15 mg/dL (ref 8–27)
Bilirubin Total: 0.5 mg/dL (ref 0.0–1.2)
CO2: 23 mmol/L (ref 20–29)
Calcium: 9.8 mg/dL (ref 8.6–10.2)
Chloride: 106 mmol/L (ref 96–106)
Creatinine, Ser: 0.97 mg/dL (ref 0.76–1.27)
Globulin, Total: 2.6 g/dL (ref 1.5–4.5)
Glucose: 114 mg/dL — ABNORMAL HIGH (ref 70–99)
Potassium: 4.5 mmol/L (ref 3.5–5.2)
Sodium: 143 mmol/L (ref 134–144)
Total Protein: 7.6 g/dL (ref 6.0–8.5)
eGFR: 84 mL/min/{1.73_m2} (ref >59)

## 2021-12-22 LAB — CBC
Hematocrit: 44.4 % (ref 37.5–51.0)
Hemoglobin: 14.3 g/dL (ref 13.0–17.7)
MCH: 27 pg (ref 26.6–33.0)
MCHC: 32.2 g/dL (ref 31.5–35.7)
MCV: 84 fL (ref 79–97)
Platelets: 191 10*3/uL (ref 150–450)
RBC: 5.3 x10E6/uL (ref 4.14–5.80)
RDW: 16.5 % — ABNORMAL HIGH (ref 11.6–15.4)
WBC: 5.2 10*3/uL (ref 3.4–10.8)

## 2021-12-22 LAB — LIPID PANEL
Chol/HDL Ratio: 4.1 ratio (ref 0.0–5.0)
Cholesterol, Total: 172 mg/dL (ref 100–199)
HDL: 42 mg/dL (ref >39)
LDL Chol Calc (NIH): 108 mg/dL — ABNORMAL HIGH (ref 0–99)
Triglycerides: 119 mg/dL (ref 0–149)
VLDL Cholesterol Cal: 22 mg/dL (ref 5–40)

## 2021-12-22 LAB — HEMOGLOBIN A1C
Est. average glucose Bld gHb Est-mCnc: 169 mg/dL
Hgb A1c MFr Bld: 7.5 % — ABNORMAL HIGH (ref 4.8–5.6)

## 2021-12-22 LAB — MICROALBUMIN / CREATININE URINE RATIO
Creatinine, Urine: 65.3 mg/dL
Microalb/Creat Ratio: 14 mg/g creat (ref 0–29)
Microalbumin, Urine: 9.4 ug/mL

## 2022-01-11 ENCOUNTER — Telehealth: Payer: Self-pay | Admitting: Pharmacy Technician

## 2022-01-11 DIAGNOSIS — Z596 Low income: Secondary | ICD-10-CM

## 2022-01-11 NOTE — Progress Notes (Signed)
[facility])                                            [place] Team    01/11/2022  Craig York 1951/07/27 [id number]  Care coordination call placed to [facility] in regard to [facility] application.  Spoke to Tammy who informs patient is APPROVED 01/04/22-11/26/22. She informs initial order will automatically be processed and delivered to the patient's home. Subsequent refill requests will need to be initiated by the patient. She informs he can call BI at [phone] to request his next refill. She informs he should call 10-14 days prior to running out of medication to allow BI time to process and deliver the request.  Sharee Pimple P. Izzy [place], [place]  [phone]

## 2022-01-18 ENCOUNTER — Other Ambulatory Visit: Payer: Self-pay | Admitting: Family Medicine

## 2022-01-18 DIAGNOSIS — M5416 Radiculopathy, lumbar region: Secondary | ICD-10-CM

## 2022-01-18 MED ORDER — OXYCODONE-ACETAMINOPHEN 10-325 MG PO TABS: 1.0000 | ORAL_TABLET | Freq: Three times a day (TID) | ORAL | 0 refills | Status: DC | PRN

## 2022-01-18 NOTE — Telephone Encounter (Signed)
Requested medication (s) are due for refill today:   Provider to review  Requested medication (s) are on the active medication list:   Yes  Future visit scheduled:   Yes   Last ordered: 12/21/2021 #60, 0 refills  Non delegated refill   Requested Prescriptions  Pending Prescriptions Disp Refills   oxyCODONE-acetaminophen (PERCOCET) 10-325 MG tablet 60 tablet 0    Sig: Take 1 tablet by mouth every 8 (eight) hours as needed for pain. MAY DISPENSED TODAY 12/21/2021     Not Delegated - Analgesics:  Opioid Agonist Combinations Failed - 01/18/2022 12:35 PM      Failed - This refill cannot be delegated      Failed - Urine Drug Screen completed in last 360 days      Passed - Valid encounter within last 3 months    Recent Outpatient Visits           4 weeks ago Annual physical exam   [facility] [name], MD   5 months ago Iron deficiency anemia, unspecified iron deficiency anemia type   [facility] [name], MD   9 months ago Type 2 diabetes mellitus with microalbuminuria, without long-term current use of insulin [facility])   [facility] [name], MD   1 year ago Type 2 diabetes mellitus with microalbuminuria, without long-term current use of insulin [facility])   [facility] [name], MD   1 year ago Type 2 diabetes mellitus with microalbuminuria, without long-term current use of insulin [facility])   [facility] [name], MD       Future Appointments             In 5 months Fisher, Kirstie Peri, MD [facility]

## 2022-01-18 NOTE — Telephone Encounter (Signed)
Medication Refill - Medication: oxyCODONE-acetaminophen (PERCOCET) 10-325 MG tablet  Has the patient contacted their pharmacy? No. (Agent: If no, request that the patient contact the pharmacy for the refill. If patient does not wish to contact the pharmacy document the reason why and proceed with request.) (Agent: If yes, when and what did the pharmacy advise?)  Preferred Pharmacy (with phone number or street name): [place] (N), [place] [address]  [place], [place]) [place] [address]  Phone:  [phone]  Fax:  [phone]  Has the patient been seen for an appointment in the last year OR does the patient have an upcoming appointment? Yes.    Agent: Please be advised that RX refills may take up to 3 business days. We ask that you follow-up with your pharmacy.

## 2022-01-30 DIAGNOSIS — R0609 Other forms of dyspnea: Secondary | ICD-10-CM | POA: Diagnosis not present

## 2022-01-30 DIAGNOSIS — Z951 Presence of aortocoronary bypass graft: Secondary | ICD-10-CM | POA: Diagnosis not present

## 2022-01-30 DIAGNOSIS — Z955 Presence of coronary angioplasty implant and graft: Secondary | ICD-10-CM | POA: Diagnosis not present

## 2022-01-30 DIAGNOSIS — I25118 Atherosclerotic heart disease of native coronary artery with other forms of angina pectoris: Secondary | ICD-10-CM | POA: Diagnosis not present

## 2022-02-09 ENCOUNTER — Other Ambulatory Visit: Payer: Self-pay | Admitting: Family Medicine

## 2022-02-09 NOTE — Telephone Encounter (Signed)
Requested medication (s) are due for refill today: yes ? ?Requested medication (s) are on the active medication list: yes ? ?Last refill:  01/18/22 #60/0 ? ?Future visit scheduled: yes ? ?Notes to clinic:  Unable to refill per protocol, cannot delegate. ? ? ? ?  ?Requested Prescriptions  ?Pending Prescriptions Disp Refills  ? oxyCODONE-acetaminophen (PERCOCET) 10-325 MG tablet 60 tablet 0  ?  Sig: Take 1 tablet by mouth every 8 (eight) hours as needed for pain. MAY DISPENSED TODAY 12/21/2021  ?  ? Not Delegated - Analgesics:  Opioid Agonist Combinations Failed - 02/09/2022 10:06 AM  ?  ?  Failed - This refill cannot be delegated  ?  ?  Failed - Urine Drug Screen completed in last 360 days  ?  ?  Passed - Valid encounter within last 3 months  ?  Recent Outpatient Visits   ? ?      ? 1 month ago Annual physical exam  ? [facility] [name], MD  ? 6 months ago Iron deficiency anemia, unspecified iron deficiency anemia type  ? [facility] [name], MD  ? 9 months ago Type 2 diabetes mellitus with microalbuminuria, without long-term current use of insulin ([place])  ? [facility] [name], MD  ? 1 year ago Type 2 diabetes mellitus with microalbuminuria, without long-term current use of insulin ([place])  ? [facility] [name], MD  ? 1 year ago Type 2 diabetes mellitus with microalbuminuria, without long-term current use of insulin ([place])  ? [facility] Caryn Section, Kirstie Peri, MD  ? ?  ?  ?Future Appointments   ? ?        ? In 4 months Fisher, Kirstie Peri, MD [facility], [facility]  ? ?  ? ?  ?  ?  ? ?

## 2022-02-09 NOTE — Telephone Encounter (Signed)
Medication Refill - Medication: oxyCODONE-acetaminophen (PERCOCET) 10-325 MG tablet   ? ?Has the patient contacted their pharmacy? Yes.   ? ?(Agent: If yes, when and what did the pharmacy advise?) Contact PCP office  ? ?Preferred Pharmacy (with phone number or street name):  ?[place] ([place]), [place] - [place] Phone:  [phone]  ?Fax:  [phone]  ?  ? ? ?Has the patient been seen for an appointment in the last year OR does the patient have an upcoming appointment? Yes.   ? ?Agent: Please be advised that RX refills may take up to 3 business days. We ask that you follow-up with your pharmacy. ?

## 2022-02-10 MED ORDER — OXYCODONE-ACETAMINOPHEN 10-325 MG PO TABS: 1.0000 | ORAL_TABLET | Freq: Three times a day (TID) | ORAL | 0 refills | Status: DC | PRN

## 2022-02-27 ENCOUNTER — Other Ambulatory Visit: Payer: Self-pay | Admitting: Family Medicine

## 2022-02-27 DIAGNOSIS — E1129 Type 2 diabetes mellitus with other diabetic kidney complication: Secondary | ICD-10-CM

## 2022-02-27 DIAGNOSIS — I10 Essential (primary) hypertension: Secondary | ICD-10-CM

## 2022-03-02 ENCOUNTER — Other Ambulatory Visit: Payer: Self-pay | Admitting: Family Medicine

## 2022-03-02 DIAGNOSIS — M5416 Radiculopathy, lumbar region: Secondary | ICD-10-CM

## 2022-03-02 NOTE — Telephone Encounter (Signed)
Requested medication (s) are due for refill today - unsure ? ?Requested medication (s) are on the active medication list -yes ? ?Future visit scheduled -yes ? ?Last refill: 02/10/22 #60 ? ?Notes to clinic: Request RF: non delegated Rx ? ?Requested Prescriptions  ?Pending Prescriptions Disp Refills  ? oxyCODONE-acetaminophen (PERCOCET) 10-325 MG tablet 60 tablet 0  ?  Sig: Take 1 tablet by mouth every 8 (eight) hours as needed for pain. MAY DISPENSED TODAY 12/21/2021  ?  ? Not Delegated - Analgesics:  Opioid Agonist Combinations Failed - 03/02/2022  1:28 PM  ?  ?  Failed - This refill cannot be delegated  ?  ?  Failed - Urine Drug Screen completed in last 360 days  ?  ?  Passed - Valid encounter within last 3 months  ?  Recent Outpatient Visits   ? ?      ? 2 months ago Annual physical exam  ? [facility] [name], MD  ? 6 months ago Iron deficiency anemia, unspecified iron deficiency anemia type  ? [facility] [name], MD  ? 10 months ago Type 2 diabetes mellitus with microalbuminuria, without long-term current use of insulin ([place])  ? [facility] [name], MD  ? 1 year ago Type 2 diabetes mellitus with microalbuminuria, without long-term current use of insulin ([place])  ? [facility] [name], MD  ? 1 year ago Type 2 diabetes mellitus with microalbuminuria, without long-term current use of insulin ([place])  ? [facility] Caryn Section, Kirstie Peri, MD  ? ?  ?  ?Future Appointments   ? ?        ? In 3 months Fisher, Kirstie Peri, MD [facility]  ? ?  ? ?  ?  ?  ? ? ? ?Requested Prescriptions  ?Pending Prescriptions Disp Refills  ? oxyCODONE-acetaminophen (PERCOCET) 10-325 MG tablet 60 tablet 0  ?  Sig: Take 1 tablet by mouth every 8 (eight) hours as needed for pain. MAY DISPENSED TODAY 12/21/2021  ?  ? Not Delegated - Analgesics:  Opioid Agonist Combinations Failed - 03/02/2022  1:28 PM  ?  ?  Failed - This refill  cannot be delegated  ?  ?  Failed - Urine Drug Screen completed in last 360 days  ?  ?  Passed - Valid encounter within last 3 months  ?  Recent Outpatient Visits   ? ?      ? 2 months ago Annual physical exam  ? [facility] [name], MD  ? 6 months ago Iron deficiency anemia, unspecified iron deficiency anemia type  ? [facility] [name], MD  ? 10 months ago Type 2 diabetes mellitus with microalbuminuria, without long-term current use of insulin ([place])  ? [facility] [name], MD  ? 1 year ago Type 2 diabetes mellitus with microalbuminuria, without long-term current use of insulin ([place])  ? [facility] [name], MD  ? 1 year ago Type 2 diabetes mellitus with microalbuminuria, without long-term current use of insulin ([place])  ? [facility] Caryn Section, Kirstie Peri, MD  ? ?  ?  ?Future Appointments   ? ?        ? In 3 months Fisher, Kirstie Peri, MD [facility], [facility]  ? ?  ? ?  ?  ?  ? ? ? ?

## 2022-03-02 NOTE — Telephone Encounter (Signed)
Copied from [place] [id number]. Topic: Quick Communication - Rx Refill/Question ?>> Mar 02, 2022  9:51 AM Yvette Rack wrote: ?Medication: oxyCODONE-acetaminophen (PERCOCET) 10-325 MG tablet ? ?Has the patient contacted their pharmacy? No. Pt previously told to contact provider ?(Agent: If no, request that the patient contact the pharmacy for the refill. If patient does not wish to contact the pharmacy document the reason why and proceed with request.) ?(Agent: If yes, when and what did the pharmacy advise?) ? ?Preferred Pharmacy (with phone number or street name): [place] (N), [place]  ?Phone: [phone] ?Fax: [phone] ? ?Has the patient been seen for an appointment in the last year OR does the patient have an upcoming appointment? Yes.   ? ?Agent: Please be advised that RX refills may take up to 3 business days. We ask that you follow-up with your pharmacy. ?

## 2022-03-03 MED ORDER — OXYCODONE-ACETAMINOPHEN 10-325 MG PO TABS: 1.0000 | ORAL_TABLET | Freq: Three times a day (TID) | ORAL | 0 refills | Status: DC | PRN

## 2022-03-24 ENCOUNTER — Other Ambulatory Visit: Payer: Self-pay | Admitting: Family Medicine

## 2022-03-24 DIAGNOSIS — M5416 Radiculopathy, lumbar region: Secondary | ICD-10-CM

## 2022-03-24 MED ORDER — OXYCODONE-ACETAMINOPHEN 10-325 MG PO TABS: 1.0000 | ORAL_TABLET | Freq: Three times a day (TID) | ORAL | 0 refills | Status: DC | PRN

## 2022-03-24 NOTE — Telephone Encounter (Signed)
Pt called in to follow up on refill request. Pt says that he is going out of town and would like to pick up his Rx as soon as possible.  ? ?Advised pt of Rx current status.  ?

## 2022-03-24 NOTE — Telephone Encounter (Signed)
Requested medication (s) are due for refill today:yes ? ?Requested medication (s) are on the active medication list: yes   ? ?Last refill: 03/03/22  #60  0 refills ? ?Future visit scheduled yes 06/20/22 ? ?Notes to clinic:not delegated, please review. ? ?Requested Prescriptions  ?Pending Prescriptions Disp Refills  ? oxyCODONE-acetaminophen (PERCOCET) 10-325 MG tablet 60 tablet 0  ?  Sig: Take 1 tablet by mouth every 8 (eight) hours as needed for pain. MAY DISPENSED TODAY 12/21/2021  ?  ? Not Delegated - Analgesics:  Opioid Agonist Combinations Failed - 03/24/2022  2:42 PM  ?  ?  Failed - This refill cannot be delegated  ?  ?  Failed - Urine Drug Screen completed in last 360 days  ?  ?  Failed - Valid encounter within last 3 months  ?  Recent Outpatient Visits   ? ?      ? 3 months ago Annual physical exam  ? [facility] [name], MD  ? 7 months ago Iron deficiency anemia, unspecified iron deficiency anemia type  ? [facility] [name], MD  ? 11 months ago Type 2 diabetes mellitus with microalbuminuria, without long-term current use of insulin ([place])  ? [facility] [name], MD  ? 1 year ago Type 2 diabetes mellitus with microalbuminuria, without long-term current use of insulin ([place])  ? [facility] [name], MD  ? 1 year ago Type 2 diabetes mellitus with microalbuminuria, without long-term current use of insulin ([place])  ? [facility] Caryn Section, Kirstie Peri, MD  ? ?  ?  ?Future Appointments   ? ?        ? In 2 months Fisher, Kirstie Peri, MD [facility]  ? ?  ? ? ?  ?  ?  ? ? ? ? ?

## 2022-03-24 NOTE — Telephone Encounter (Signed)
Medication Refill - Medication:  ?oxyCODONE-acetaminophen (PERCOCET) 10-325 MG tablet ? ?Has the patient contacted their pharmacy?  ?Contact PCP ? ?Preferred Pharmacy (with phone number or street name):  ?[place] ([place]), [place] [address]  ?[place], [place]) [place] [address]  ?Phone:  [phone]  Fax:  [phone]  ? ?Has the patient been seen for an appointment in the last year OR does the patient have an upcoming appointment? Yes.   ? ?Agent: Please be advised that RX refills may take up to 3 business days. We ask that you follow-up with your pharmacy. ?

## 2022-04-06 DIAGNOSIS — H524 Presbyopia: Secondary | ICD-10-CM | POA: Diagnosis not present

## 2022-04-06 DIAGNOSIS — E113393 Type 2 diabetes mellitus with moderate nonproliferative diabetic retinopathy without macular edema, bilateral: Secondary | ICD-10-CM | POA: Diagnosis not present

## 2022-04-12 ENCOUNTER — Other Ambulatory Visit: Payer: Self-pay

## 2022-04-12 ENCOUNTER — Other Ambulatory Visit: Payer: Self-pay | Admitting: Family Medicine

## 2022-04-12 DIAGNOSIS — M5416 Radiculopathy, lumbar region: Secondary | ICD-10-CM

## 2022-04-12 NOTE — Telephone Encounter (Signed)
Patient called and wanted a refill for the following medications: ?Requested Prescriptions  ? ?Pending Prescriptions Disp Refills  ? oxyCODONE-acetaminophen (PERCOCET) 10-325 MG tablet 60 tablet 0  ?  Sig: Take 1 tablet by mouth every 8 (eight) hours as needed for pain. MAY DISPENSED TODAY 12/21/2021  ? pantoprazole (PROTONIX) 40 MG tablet 90 tablet 0  ?  Sig: Take 1 tablet (40 mg total) by mouth daily.  ?   ?[facility] on [address] in [place] ?

## 2022-04-12 NOTE — Telephone Encounter (Signed)
Refilled 04/12/2022 #90 4 refills. ?Requested Prescriptions  ?Pending Prescriptions Disp Refills  ?? oxyCODONE-acetaminophen (PERCOCET) 10-325 MG tablet 60 tablet 0  ?  Sig: Take 1 tablet by mouth every 8 (eight) hours as needed for pain. MAY DISPENSED TODAY 12/21/2021  ?  ? Not Delegated - Analgesics:  Opioid Agonist Combinations Failed - 04/12/2022 11:05 AM  ?  ?  Failed - This refill cannot be delegated  ?  ?  Failed - Urine Drug Screen completed in last 360 days  ?  ?  Failed - Valid encounter within last 3 months  ?  Recent Outpatient Visits   ?      ? 3 months ago Annual physical exam  ? [facility] [name], MD  ? 8 months ago Iron deficiency anemia, unspecified iron deficiency anemia type  ? [facility] [name], MD  ? 11 months ago Type 2 diabetes mellitus with microalbuminuria, without long-term current use of insulin ([place])  ? [facility] [name], MD  ? 1 year ago Type 2 diabetes mellitus with microalbuminuria, without long-term current use of insulin ([place])  ? [facility] [name], MD  ? 1 year ago Type 2 diabetes mellitus with microalbuminuria, without long-term current use of insulin ([place])  ? [facility] Caryn Section, Kirstie Peri, MD  ?  ?  ?Future Appointments   ?        ? In 2 months Fisher, Kirstie Peri, MD [facility]  ?  ? ?  ?  ?  ?? pantoprazole (PROTONIX) 40 MG tablet 90 tablet 0  ?  Sig: Take 1 tablet (40 mg total) by mouth daily.  ?  ? Gastroenterology: Proton Pump Inhibitors Passed - 04/12/2022 11:05 AM  ?  ?  Passed - Valid encounter within last 12 months  ?  Recent Outpatient Visits   ?      ? 3 months ago Annual physical exam  ? [facility] [name], MD  ? 8 months ago Iron deficiency anemia, unspecified iron deficiency anemia type  ? [facility] [name], MD  ? 11 months ago Type 2 diabetes mellitus with microalbuminuria, without  long-term current use of insulin ([place])  ? [facility] [name], MD  ? 1 year ago Type 2 diabetes mellitus with microalbuminuria, without long-term current use of insulin ([place])  ? [facility] [name], MD  ? 1 year ago Type 2 diabetes mellitus with microalbuminuria, without long-term current use of insulin ([place])  ? [facility] Caryn Section, Kirstie Peri, MD  ?  ?  ?Future Appointments   ?        ? In 2 months Fisher, Kirstie Peri, MD [facility]  ?  ? ?  ?  ?  ? ?

## 2022-04-12 NOTE — Telephone Encounter (Signed)
Requested medications are due for refill today.  unsure ? ?Requested medications are on the active medications list.  yes ? ?Last refill. 03/24/2022 #60 0 refills ? ?Future visit scheduled.   yes ? ?Notes to clinic.  Medication refill is not delegated. ? ? ? ?Requested Prescriptions  ?Pending Prescriptions Disp Refills  ? oxyCODONE-acetaminophen (PERCOCET) 10-325 MG tablet 60 tablet 0  ?  Sig: Take 1 tablet by mouth every 8 (eight) hours as needed for pain. MAY DISPENSED TODAY 12/21/2021  ?  ? Not Delegated - Analgesics:  Opioid Agonist Combinations Failed - 04/12/2022 11:05 AM  ?  ?  Failed - This refill cannot be delegated  ?  ?  Failed - Urine Drug Screen completed in last 360 days  ?  ?  Failed - Valid encounter within last 3 months  ?  Recent Outpatient Visits   ? ?      ? 3 months ago Annual physical exam  ? [facility] [name], [facility]  ? 8 months ago Iron deficiency anemia, unspecified iron deficiency anemia type  ? [facility] [name], MD  ? 11 months ago Type 2 diabetes mellitus with microalbuminuria, without long-term current use of insulin ([place])  ? [facility] [name], MD  ? 1 year ago Type 2 diabetes mellitus with microalbuminuria, without long-term current use of insulin ([place])  ? [facility] [name], MD  ? 1 year ago Type 2 diabetes mellitus with microalbuminuria, without long-term current use of insulin ([place])  ? [facility] Caryn Section, Kirstie Peri, MD  ? ?  ?  ?Future Appointments   ? ?        ? In 2 months Fisher, Kirstie Peri, MD [facility]  ? ?  ? ? ?  ?  ?  ?Refused Prescriptions Disp Refills  ? pantoprazole (PROTONIX) 40 MG tablet 90 tablet 0  ?  Sig: Take 1 tablet (40 mg total) by mouth daily.  ?  ? Gastroenterology: Proton Pump Inhibitors Passed - 04/12/2022 11:05 AM  ?  ?  Passed - Valid encounter within last 12 months  ?  Recent Outpatient Visits   ? ?      ? 3 months ago Annual  physical exam  ? [facility] [name], MD  ? 8 months ago Iron deficiency anemia, unspecified iron deficiency anemia type  ? [facility] [name], MD  ? 11 months ago Type 2 diabetes mellitus with microalbuminuria, without long-term current use of insulin ([place])  ? [facility] [name], MD  ? 1 year ago Type 2 diabetes mellitus with microalbuminuria, without long-term current use of insulin ([place])  ? [facility] [name], MD  ? 1 year ago Type 2 diabetes mellitus with microalbuminuria, without long-term current use of insulin ([place])  ? [facility] Caryn Section, Kirstie Peri, MD  ? ?  ?  ?Future Appointments   ? ?        ? In 2 months Fisher, Kirstie Peri, MD [facility]  ? ?  ? ? ?  ?  ?  ?  ?

## 2022-04-13 MED ORDER — OXYCODONE-ACETAMINOPHEN 10-325 MG PO TABS: 1.0000 | ORAL_TABLET | Freq: Three times a day (TID) | ORAL | 0 refills | Status: DC | PRN

## 2022-05-05 ENCOUNTER — Other Ambulatory Visit: Payer: Self-pay | Admitting: Family Medicine

## 2022-05-05 DIAGNOSIS — M5416 Radiculopathy, lumbar region: Secondary | ICD-10-CM

## 2022-05-05 NOTE — Telephone Encounter (Signed)
Requested medication (s) are due for refill today -yes  Requested medication (s) are on the active medication list -yes  Future visit scheduled -yes  Last refill: 04/13/22 #60  Notes to clinic: non delegated Rx  Requested Prescriptions  Pending Prescriptions Disp Refills   oxyCODONE-acetaminophen (PERCOCET) 10-325 MG tablet 60 tablet 0    Sig: Take 1 tablet by mouth every 8 (eight) hours as needed for pain. MAY DISPENSED TODAY 12/21/2021     Not Delegated - Analgesics:  Opioid Agonist Combinations Failed - 05/05/2022 10:23 AM      Failed - This refill cannot be delegated      Failed - Urine Drug Screen completed in last 360 days      Failed - Valid encounter within last 3 months    Recent Outpatient Visits           4 months ago Annual physical exam   [facility] [name], MD   8 months ago Iron deficiency anemia, unspecified iron deficiency anemia type   [facility] [name], MD   1 year ago Type 2 diabetes mellitus with microalbuminuria, without long-term current use of insulin [facility])   [facility] [name], MD   1 year ago Type 2 diabetes mellitus with microalbuminuria, without long-term current use of insulin [facility])   [facility] [name], MD   1 year ago Type 2 diabetes mellitus with microalbuminuria, without long-term current use of insulin [facility])   [facility] [name], MD       Future Appointments             In 1 month Fisher, Kirstie Peri, MD [facility]               Requested Prescriptions  Pending Prescriptions Disp Refills   oxyCODONE-acetaminophen (PERCOCET) 10-325 MG tablet 60 tablet 0    Sig: Take 1 tablet by mouth every 8 (eight) hours as needed for pain. MAY DISPENSED TODAY 12/21/2021     Not Delegated - Analgesics:  Opioid Agonist Combinations Failed - 05/05/2022 10:23 AM      Failed - This refill cannot be delegated       Failed - Urine Drug Screen completed in last 360 days      Failed - Valid encounter within last 3 months    Recent Outpatient Visits           4 months ago Annual physical exam   [facility] [name], MD   8 months ago Iron deficiency anemia, unspecified iron deficiency anemia type   [facility] [name], MD   1 year ago Type 2 diabetes mellitus with microalbuminuria, without long-term current use of insulin [facility])   [facility] [name], MD   1 year ago Type 2 diabetes mellitus with microalbuminuria, without long-term current use of insulin [facility])   [facility] [name], MD   1 year ago Type 2 diabetes mellitus with microalbuminuria, without long-term current use of insulin [facility])   [facility] [name], MD       Future Appointments             In 1 month Fisher, Kirstie Peri, MD [facility] [place]

## 2022-05-05 NOTE — Telephone Encounter (Signed)
Medication Refill - Medication: oxyCODONE-acetaminophen (PERCOCET) 10-325 MG tablet  Has the patient contacted their pharmacy? No. (Agent: If no, request that the patient contact the pharmacy for the refill. If patient does not wish to contact the pharmacy document the reason why and proceed with request.) (Agent: If yes, when and what did the pharmacy advise?)  Preferred Pharmacy (with phone number or street name): [facility] ([place]), [place] [address]  [place], [place] ([place]) [place] [address]  Phone:  [phone]  Fax:  [phone]  Has the patient been seen for an appointment in the last year OR does the patient have an upcoming appointment? Yes.    Agent: Please be advised that RX refills may take up to 3 business days. We ask that you follow-up with your pharmacy.

## 2022-05-06 MED ORDER — OXYCODONE-ACETAMINOPHEN 10-325 MG PO TABS: 1.0000 | ORAL_TABLET | Freq: Three times a day (TID) | ORAL | 0 refills | Status: DC | PRN

## 2022-05-26 ENCOUNTER — Other Ambulatory Visit: Payer: Self-pay | Admitting: Family Medicine

## 2022-05-26 DIAGNOSIS — I10 Essential (primary) hypertension: Secondary | ICD-10-CM

## 2022-05-26 DIAGNOSIS — M5416 Radiculopathy, lumbar region: Secondary | ICD-10-CM

## 2022-05-26 MED ORDER — OXYCODONE-ACETAMINOPHEN 10-325 MG PO TABS: 1.0000 | ORAL_TABLET | Freq: Three times a day (TID) | ORAL | 0 refills | Status: AC | PRN

## 2022-05-26 NOTE — Telephone Encounter (Signed)
Pt called to check status of request.

## 2022-05-26 NOTE — Telephone Encounter (Signed)
Medication Refill - Medication: oxyCODONE-acetaminophen (PERCOCET) 10-325 MG tablet   Has the patient contacted their pharmacy? yes (Agent: If no, request that the patient contact the pharmacy for the refill. If patient does not wish to contact the pharmacy document the reason why and proceed with request.) (Agent: If yes, when and what did the pharmacy advise?)contact pcp  Preferred Pharmacy (with phone number or street name):  [id number] - [place] ([place]), [place] - [address] Phone: [phone]  Fax: [phone]     Has the patient been seen for an appointment in the last year OR does the patient have an upcoming appointment? yes  Agent: Please be advised that RX refills may take up to 3 business days. We ask that you follow-up with your pharmacy.  

## 2022-05-26 NOTE — Telephone Encounter (Signed)
Requested Prescriptions  Pending Prescriptions Disp Refills  . metoprolol succinate (TOPROL-XL) 25 MG 24 hr tablet [Pharmacy Med Name: Metoprolol Succinate ER 25 MG Oral Tablet Extended Release 24 Hour] 90 tablet 0    Sig: Take 1 tablet by mouth once daily     Cardiovascular:  Beta Blockers Failed - 05/26/2022  9:05 AM      Failed - Last BP in normal range    BP Readings from Last 1 Encounters:  12/21/21 (!) 150/88         Passed - Last Heart Rate in normal range    Pulse Readings from Last 1 Encounters:  12/21/21 68         Passed - Valid encounter within last 6 months    Recent Outpatient Visits          5 months ago Annual physical exam   [facility] [name], MD   9 months ago Iron deficiency anemia, unspecified iron deficiency anemia type   [facility] [name], MD   1 year ago Type 2 diabetes mellitus with microalbuminuria, without long-term current use of insulin [facility])   [facility] [name], MD   1 year ago Type 2 diabetes mellitus with microalbuminuria, without long-term current use of insulin [facility])   [facility] [name], MD   1 year ago Type 2 diabetes mellitus with microalbuminuria, without long-term current use of insulin [facility])   [facility] [name], MD      Future Appointments            In 3 weeks Fisher, Kirstie Peri, MD [facility]

## 2022-05-26 NOTE — Telephone Encounter (Signed)
Requested medication (s) are due for refill today:   Provider to review  Requested medication (s) are on the active medication list:   Yes  Future visit scheduled:   Yes 7/25   Last ordered: 05/06/2022 #60 0 refills   Returned because this is a non delegated refill  Requested Prescriptions  Pending Prescriptions Disp Refills   oxyCODONE-acetaminophen (PERCOCET) 10-325 MG tablet 60 tablet 0    Sig: Take 1 tablet by mouth every 8 (eight) hours as needed for pain. MAY DISPENSED TODAY 12/21/2021     Not Delegated - Analgesics:  Opioid Agonist Combinations Failed - 05/26/2022  2:09 PM      Failed - This refill cannot be delegated      Failed - Urine Drug Screen completed in last 360 days      Failed - Valid encounter within last 3 months    Recent Outpatient Visits           5 months ago Annual physical exam   [facility] [name], MD   9 months ago Iron deficiency anemia, unspecified iron deficiency anemia type   [facility] [name], MD   1 year ago Type 2 diabetes mellitus with microalbuminuria, without long-term current use of insulin [facility])   [facility] [name], MD   1 year ago Type 2 diabetes mellitus with microalbuminuria, without long-term current use of insulin [facility])   [facility] [name], MD   1 year ago Type 2 diabetes mellitus with microalbuminuria, without long-term current use of insulin [facility])   [facility] [name], MD       Future Appointments             In 3 weeks Fisher, Kirstie Peri, MD [facility]

## 2022-05-26 NOTE — Telephone Encounter (Signed)
Requested medications are due for refill today.  yes  Requested medications are on the active medications list.  yes  Last refill. 05/06/2022 #60 0 refills  Future visit scheduled.   yes  Notes to clinic.  Refill not delegated.    Requested Prescriptions  Pending Prescriptions Disp Refills   oxyCODONE-acetaminophen (PERCOCET) 10-325 MG tablet 60 tablet 0    Sig: Take 1 tablet by mouth every 8 (eight) hours as needed for pain. MAY DISPENSED TODAY 12/21/2021     Not Delegated - Analgesics:  Opioid Agonist Combinations Failed - 05/26/2022 10:38 AM      Failed - This refill cannot be delegated      Failed - Urine Drug Screen completed in last 360 days      Failed - Valid encounter within last 3 months    Recent Outpatient Visits           5 months ago Annual physical exam   [facility] [name], MD   9 months ago Iron deficiency anemia, unspecified iron deficiency anemia type   [facility] [name], MD   1 year ago Type 2 diabetes mellitus with microalbuminuria, without long-term current use of insulin [facility])   [facility] [name], MD   1 year ago Type 2 diabetes mellitus with microalbuminuria, without long-term current use of insulin [facility])   [facility] [name], MD   1 year ago Type 2 diabetes mellitus with microalbuminuria, without long-term current use of insulin [facility])   [facility] [name], MD       Future Appointments             In 3 weeks Fisher, Kirstie Peri, MD [facility]

## 2022-06-12 ENCOUNTER — Telehealth: Payer: Self-pay | Admitting: Family Medicine

## 2022-06-12 DIAGNOSIS — M5416 Radiculopathy, lumbar region: Secondary | ICD-10-CM

## 2022-06-12 DIAGNOSIS — M5430 Sciatica, unspecified side: Secondary | ICD-10-CM

## 2022-06-12 DIAGNOSIS — R52 Pain, unspecified: Secondary | ICD-10-CM

## 2022-06-12 NOTE — Telephone Encounter (Unsigned)
Oxycod/Acetam 10-325 , 60 tablets, 1 by mouth every 8 hrs request from pharmacy

## 2022-06-13 MED ORDER — OXYCODONE-ACETAMINOPHEN 10-325 MG PO TABS: 1.0000 | ORAL_TABLET | Freq: Three times a day (TID) | ORAL | 0 refills | Status: DC | PRN

## 2022-06-16 ENCOUNTER — Telehealth: Payer: Self-pay | Admitting: Family Medicine

## 2022-06-16 NOTE — Telephone Encounter (Signed)
Michelle Pharmacist calling from [facility] is calling to recommend a statin drug. Since records show that patient may have diabetes. Guidelines recommend that pt between 40-70 years to lower cardiovascular disease. [place]- [phone]

## 2022-06-19 NOTE — Telephone Encounter (Signed)
Patient returned call- he is not clear on why the cholesterol medication was stopped- he does not remember. Patient note 12/21/21 - medication was discontinued by provider as patient reports not taking.Advised can discuss at upcoming visit with PCP

## 2022-06-19 NOTE — Telephone Encounter (Signed)
Pharmacist with [facility] informed.  Attempted to reach pt and call got disconnected.  If pt calls in [place] for [facility] triage to please inquire as to tolerance to statin.

## 2022-06-19 NOTE — Telephone Encounter (Unsigned)
Med sent , pt needs to let us know if he can tolerate it

## 2022-06-20 ENCOUNTER — Ambulatory Visit: Payer: Medicare HMO | Admitting: Family Medicine

## 2022-06-30 ENCOUNTER — Other Ambulatory Visit: Payer: Self-pay | Admitting: Family Medicine

## 2022-06-30 DIAGNOSIS — R52 Pain, unspecified: Secondary | ICD-10-CM

## 2022-06-30 DIAGNOSIS — M5416 Radiculopathy, lumbar region: Secondary | ICD-10-CM

## 2022-06-30 DIAGNOSIS — M5430 Sciatica, unspecified side: Secondary | ICD-10-CM

## 2022-06-30 MED ORDER — OXYCODONE-ACETAMINOPHEN 10-325 MG PO TABS: 1.0000 | ORAL_TABLET | Freq: Three times a day (TID) | ORAL | 0 refills | Status: DC | PRN

## 2022-06-30 NOTE — Telephone Encounter (Signed)
Copied from [place] [id number]. Topic: General - Other >> Jun 30, 2022 10:02 AM Everette C wrote: Reason for CRM: Medication Refill - Medication: oxyCODONE-acetaminophen (PERCOCET) 10-325 MG tablet [[id number]]   Has the patient contacted their pharmacy? No. (Agent: If no, request that the patient contact the pharmacy for the refill. If patient does not wish to contact the pharmacy document the reason why and proceed with request.) (Agent: If yes, when and what did the pharmacy advise?)  Preferred Pharmacy (with phone number or street name): [place] ([place]), [place] - [address] [place]) [place] [address] Phone: [phone] Fax: [phone] Hours: Not open 24 hours   Has the patient been seen for an appointment in the last year OR does the patient have an upcoming appointment? Yes.    Agent: Please be advised that RX refills may take up to 3 business days. We ask that you follow-up with your pharmacy.

## 2022-06-30 NOTE — Telephone Encounter (Signed)
Requested medications are due for refill today.  unsure  Requested medications are on the active medications list.  no  Last refill. 06/13/2022 #15 0 refills  Future visit scheduled.   yes  Notes to clinic.  Refill not delegated.    Requested Prescriptions  Pending Prescriptions Disp Refills   oxyCODONE-acetaminophen (PERCOCET) 10-325 MG tablet 15 tablet 0    Sig: Take 1 tablet by mouth every 8 (eight) hours as needed for up to 5 days for pain.     Not Delegated - Analgesics:  Opioid Agonist Combinations Failed - 06/30/2022 11:26 AM      Failed - This refill cannot be delegated      Failed - Urine Drug Screen completed in last 360 days      Failed - Valid encounter within last 3 months    Recent Outpatient Visits           6 months ago Annual physical exam   [facility] [name], MD   10 months ago Iron deficiency anemia, unspecified iron deficiency anemia type   [facility] [name], MD   1 year ago Type 2 diabetes mellitus with microalbuminuria, without long-term current use of insulin [facility])   [facility] [name], MD   1 year ago Type 2 diabetes mellitus with microalbuminuria, without long-term current use of insulin [facility])   [facility] [name], MD   1 year ago Type 2 diabetes mellitus with microalbuminuria, without long-term current use of insulin [facility])   [facility] [name], MD       Future Appointments             In 1 week Fisher, Kirstie Peri, MD [facility]

## 2022-07-07 ENCOUNTER — Other Ambulatory Visit: Payer: Self-pay | Admitting: Family Medicine

## 2022-07-07 DIAGNOSIS — E1129 Type 2 diabetes mellitus with other diabetic kidney complication: Secondary | ICD-10-CM

## 2022-07-07 DIAGNOSIS — I10 Essential (primary) hypertension: Secondary | ICD-10-CM

## 2022-07-10 ENCOUNTER — Encounter: Payer: Self-pay | Admitting: Family Medicine

## 2022-07-10 ENCOUNTER — Ambulatory Visit (INDEPENDENT_AMBULATORY_CARE_PROVIDER_SITE_OTHER): Payer: Medicare HMO | Admitting: Family Medicine

## 2022-07-10 VITALS — BP 125/88 | HR 62 | Temp 98.4°F | Resp 14 | Wt 185.0 lb

## 2022-07-10 DIAGNOSIS — R809 Proteinuria, unspecified: Secondary | ICD-10-CM | POA: Diagnosis not present

## 2022-07-10 DIAGNOSIS — N529 Male erectile dysfunction, unspecified: Secondary | ICD-10-CM | POA: Diagnosis not present

## 2022-07-10 DIAGNOSIS — F1190 Opioid use, unspecified, uncomplicated: Secondary | ICD-10-CM | POA: Diagnosis not present

## 2022-07-10 DIAGNOSIS — E1129 Type 2 diabetes mellitus with other diabetic kidney complication: Secondary | ICD-10-CM

## 2022-07-10 LAB — POCT GLYCOSYLATED HEMOGLOBIN (HGB A1C)
Est. average glucose Bld gHb Est-mCnc: 206
Hemoglobin A1C: 8.8 % — AB (ref 4.0–5.6)

## 2022-07-10 MED ORDER — TADALAFIL 20 MG PO TABS: 20.0000 mg | ORAL_TABLET | Freq: Every day | ORAL | 3 refills | Status: DC | PRN

## 2022-07-10 MED ORDER — RYBELSUS 3 MG PO TABS: 3.0000 mg | ORAL_TABLET | Freq: Every day | ORAL | 0 refills | Status: DC

## 2022-07-10 NOTE — Patient Instructions (Addendum)
Please review the attached list of medications and notify my office if there are any errors.   Make sure you talk to Dr. Clayborn Bigness about cholesterol medications.You were taking rosuvastatin, but I don't know if there is something different he wants you to take.   Start taking Rybelsus '3mg'$  one tablet every day. We'll call in in 3-4 weeks to see how you are doing with the samples. This medication might make you a little constipated, so you should take an over the counter stool softener such as docusate (Colace) every day

## 2022-07-10 NOTE — Progress Notes (Signed)
I,[name],acting as a scribe for Lelon Huh, MD.,have documented all relevant documentation on the behalf of Lelon Huh, MD,as directed by  Lelon Huh, MD while in the presence of Lelon Huh, MD.   Established patient visit   Patient: Craig York   DOB: [birth year]   71 y.o. Male  MRN: [id number] Visit Date: 07/10/2022  Today's healthcare provider: Lelon Huh, MD   Chief Complaint  Patient presents with   Diabetes   Hypertension   Pain Management   Subjective    HPI  Diabetes Mellitus Type II, Follow-up  Lab Results  Component Value Date   HGBA1C 8.8 (A) 07/10/2022   HGBA1C 7.5 (H) 12/21/2021   HGBA1C 5.1 08/15/2021   Wt Readings from Last 3 Encounters:  07/10/22 185 lb (83.9 kg)  12/21/21 189 lb (85.7 kg)  08/12/21 190 lb 9.6 oz (86.5 kg)   Last seen for diabetes 6 months ago.  Management since then includes encouraging patient to try being more strict with diet. He reports good compliance with treatment. He is not having side effects.  Symptoms: No fatigue No foot ulcerations  No appetite changes No nausea  No paresthesia of the feet  No polydipsia  No polyuria No visual disturbances   No vomiting     Home blood sugar records:  blood sugars are not checked regularly  Episodes of hypoglycemia? No    Current insulin regiment: none Most Recent Eye Exam: not UTD Current exercise: walking Current diet habits: well balanced  Pertinent Labs: Lab Results  Component Value Date   CHOL 172 12/21/2021   HDL 42 12/21/2021   LDLCALC 108 (H) 12/21/2021   TRIG 119 12/21/2021   CHOLHDL 4.1 12/21/2021   Lab Results  Component Value Date   NA 143 12/21/2021   K 4.5 12/21/2021   CREATININE 0.97 12/21/2021   EGFR 84 12/21/2021   MICROALBUR negative 12/27/2020   LABMICR 9.4 12/21/2021     ---------------------------------------------------------------------------------------------------   Hypertension, follow-up  BP Readings  from Last 3 Encounters:  07/10/22 125/88  12/21/21 (!) 150/88  08/12/21 (!) 143/83   Wt Readings from Last 3 Encounters:  07/10/22 185 lb (83.9 kg)  12/21/21 189 lb (85.7 kg)  08/12/21 190 lb 9.6 oz (86.5 kg)     He was last seen for hypertension 6 months ago.  BP at that visit was 150/88. Management since that visit includes continue same medication.  He reports good compliance with treatment. He is not having side effects.  He is following a Regular diet. He is exercising. He does not smoke.  Use of agents associated with hypertension: NSAIDS.   Outside blood pressures are not checked. Symptoms: No chest pain No chest pressure  No palpitations No syncope  No dyspnea No orthopnea  No paroxysmal nocturnal dyspnea No lower extremity edema     ---------------------------------------------------------------------------------------------------   Follow up for Lumbar radiculopathy:  The patient was last seen for this 6 months ago. Changes made at last visit include increasing quantity of oxycodone-acetaminophen.  He reports good compliance with treatment. He feels that condition is Unchanged. He is not having side effects.   -----------------------------------------------------------------------------------------   Medications: Outpatient Medications Prior to Visit  Medication Sig   acetaminophen (TYLENOL) 500 MG tablet Take 1,000 mg by mouth every 6 (six) hours as needed for moderate pain.   aspirin EC 81 MG tablet Take 81 mg by mouth daily.   empagliflozin (JARDIANCE) 10 MG TABS tablet Take 1 tablet (  10 mg total) by mouth daily before breakfast. (Patient taking differently: Take 25 mg by mouth daily before breakfast.)   ferrous sulfate 325 (65 FE) MG tablet Take 1 tablet (325 mg total) by mouth 2 (two) times daily with a meal.   losartan (COZAAR) 50 MG tablet Take 50 mg by mouth daily.   metFORMIN (GLUCOPHAGE) 500 MG tablet TAKE 2 TABLETS BY MOUTH TWICE DAILY WITH A  MEAL   metoprolol succinate (TOPROL-XL) 25 MG 24 hr tablet Take 1 tablet by mouth once daily   oxyCODONE-acetaminophen (PERCOCET) 10-325 MG tablet Take 1 tablet by mouth every 8 (eight) hours as needed for pain.   pantoprazole (PROTONIX) 40 MG tablet Take 1 tablet by mouth once daily   SYSTANE COMPLETE 0.6 % SOLN Place 1 drop into both eyes 3 (three) times daily as needed (dry/irritated eyes.).   tadalafil (CIALIS) 20 MG tablet Take 1 tablet (20 mg total) by mouth daily as needed for erectile dysfunction.   No facility-administered medications prior to visit.    Review of Systems  Constitutional:  Negative for appetite change, chills and fever.  Respiratory:  Negative for chest tightness, shortness of breath and wheezing.   Cardiovascular:  Negative for chest pain and palpitations.  Gastrointestinal:  Negative for abdominal pain, nausea and vomiting.       Objective    BP 125/88 (BP Location: Left Arm, Patient Position: Sitting, Cuff Size: Normal)   Pulse 62   Temp 98.4 F (36.9 C) (Oral)   Resp 14   Wt 185 lb (83.9 kg)   SpO2 99% Comment: room  BMI 24.41 kg/m    Physical Exam   General: Appearance:    Well developed, well nourished male in no acute distress  Eyes:    PERRL, conjunctiva/corneas clear, EOM's intact       Lungs:     Clear to auscultation bilaterally, respirations unlabored  Heart:    Normal heart rate. Normal rhythm. No murmurs, rubs, or gallops.    MS:   All extremities are intact.    Neurologic:   Awake, alert, oriented x 3. No apparent focal neurological defect.         Results for orders placed or performed in visit on 07/10/22  POCT HgB A1C  Result Value Ref Range   Hemoglobin A1C 8.8 (A) 4.0 - 5.6 %   Est. average glucose Bld gHb Est-mCnc 206     Assessment & Plan     1. Type 2 diabetes mellitus with microalbuminuria, without long-term current use of insulin (HCC) Uncontrolled. Try samples Semaglutide (RYBELSUS) 3 MG TABS; Take 3 mg by mouth  daily.  Dispense: 28 tablet; Refill: 0   2. Chronic, continuous use of opioids  - Pain Mgt Scrn (14 Drugs), Ur  3. ED (erectile dysfunction) of organic origin refill tadalafil (CIALIS) 20 MG tablet; Take 1 tablet (20 mg total) by mouth daily as needed for erectile dysfunction.  Dispense: 20 tablet; Refill: 3     The entirety of the information documented in the History of Present Illness, Review of Systems and Physical Exam were personally obtained by me. Portions of this information were initially documented by the CMA and reviewed by me for thoroughness and accuracy.     Lelon Huh, MD  [facility] [phone] (phone) [phone] (fax)  [place]

## 2022-07-11 LAB — PAIN MGT SCRN (14 DRUGS), UR
Amphetamine Scrn, Ur: NEGATIVE ng/mL
BARBITURATE SCREEN URINE: NEGATIVE ng/mL
BENZODIAZEPINE SCREEN, URINE: NEGATIVE ng/mL
Buprenorphine, Urine: NEGATIVE ng/mL
CANNABINOIDS UR QL SCN: NEGATIVE ng/mL
Cocaine (Metab) Scrn, Ur: NEGATIVE ng/mL
Creatinine(Crt), U: 71.6 mg/dL (ref 20.0–300.0)
Fentanyl, Urine: NEGATIVE pg/mL
Meperidine Screen, Urine: NEGATIVE ng/mL
Methadone Screen, Urine: NEGATIVE ng/mL
OXYCODONE+OXYMORPHONE UR QL SCN: POSITIVE ng/mL — AB
Opiate Scrn, Ur: NEGATIVE ng/mL
Ph of Urine: 5.3 (ref 4.5–8.9)
Phencyclidine Qn, Ur: NEGATIVE ng/mL
Propoxyphene Scrn, Ur: NEGATIVE ng/mL
Tramadol Screen, Urine: NEGATIVE ng/mL

## 2022-08-01 ENCOUNTER — Other Ambulatory Visit: Payer: Self-pay | Admitting: Family Medicine

## 2022-08-01 DIAGNOSIS — R52 Pain, unspecified: Secondary | ICD-10-CM

## 2022-08-01 DIAGNOSIS — M5416 Radiculopathy, lumbar region: Secondary | ICD-10-CM

## 2022-08-01 DIAGNOSIS — M5430 Sciatica, unspecified side: Secondary | ICD-10-CM

## 2022-08-01 NOTE — Telephone Encounter (Signed)
Copied from [place] [id number]. Topic: Quick Communication - See Telephone Encounter >> Aug 01, 2022  9:31 AM Carroll Kinds wrote: CRM for notification. See Telephone encounter for: 08/01/22.   Medication Refill - Medication: oxyCODONE-acetaminophen (PERCOCET) 10-325 MG tablet  Has the patient contacted their pharmacy? No. (Agent: If no, request that the patient contact the pharmacy for the refill. If patient does not wish to contact the pharmacy document the reason why and proceed with request.) (Agent: If yes, when and what did the pharmacy advise?)  Preferred Pharmacy (with phone number or street name):  [place] (N), [place] - [address]  [place]) [place] [address]  Phone: [phone] Fax: [phone]   Has the patient been seen for an appointment in the last year OR does the patient have an upcoming appointment? Yes.    Agent: Please be advised that RX refills may take up to 3 business days. We ask that you follow-up with your pharmacy.

## 2022-08-02 ENCOUNTER — Telehealth: Payer: Self-pay | Admitting: Family Medicine

## 2022-08-02 DIAGNOSIS — M5416 Radiculopathy, lumbar region: Secondary | ICD-10-CM

## 2022-08-02 DIAGNOSIS — M5430 Sciatica, unspecified side: Secondary | ICD-10-CM

## 2022-08-02 DIAGNOSIS — R52 Pain, unspecified: Secondary | ICD-10-CM

## 2022-08-02 MED ORDER — OXYCODONE-ACETAMINOPHEN 10-325 MG PO TABS: 1.0000 | ORAL_TABLET | Freq: Three times a day (TID) | ORAL | 0 refills | Status: DC | PRN

## 2022-08-02 NOTE — Telephone Encounter (Signed)
Pt would like to know if Dr. Caryn Section wants him to keep taking Semaglutide (RYBELSUS) 3 MG TABS / Pt has ran out of medication and wanted to check on advice from Dr. Caryn Section pt stated he is ok with coming to get samples or if Dr. Caryn Section will send to the pharmacy / please advise

## 2022-08-02 NOTE — Telephone Encounter (Signed)
Requested medications are due for refill today.  yes  Requested medications are on the active medications list.  yes  Last refill. 06/30/2022 #60 0 refills  Future visit scheduled.   yes  Notes to clinic.  Medication refill is not delegated.    Requested Prescriptions  Pending Prescriptions Disp Refills   oxyCODONE-acetaminophen (PERCOCET) 10-325 MG tablet 60 tablet 0    Sig: Take 1 tablet by mouth every 8 (eight) hours as needed for pain.     Not Delegated - Analgesics:  Opioid Agonist Combinations Failed - 08/01/2022 10:42 AM      Failed - This refill cannot be delegated      Failed - Urine Drug Screen completed in last 360 days      Passed - Valid encounter within last 3 months    Recent Outpatient Visits           3 weeks ago Type 2 diabetes mellitus with microalbuminuria, without long-term current use of insulin [facility])   [facility] [name], MD   7 months ago Annual physical exam   [facility] [name], MD   11 months ago Iron deficiency anemia, unspecified iron deficiency anemia type   [facility] [name], MD   1 year ago Type 2 diabetes mellitus with microalbuminuria, without long-term current use of insulin [facility])   [facility] [name], MD   1 year ago Type 2 diabetes mellitus with microalbuminuria, without long-term current use of insulin [facility])   [facility] [name], MD       Future Appointments             In 2 months Fisher, Kirstie Peri, MD [facility] [place]

## 2022-08-02 NOTE — Telephone Encounter (Signed)
Pt advised and voices understanding. Aware sample box left up front for pick up.  He is requesting refill for his Oxycodone.  LOV 07-10-22 NOV  LF 06-30-22 #60 with 0 refill

## 2022-08-02 NOTE — Telephone Encounter (Signed)
He should continue '3mg'$  for 2 more weeks, can have samples if we have them. Will send in prescription for 7 mg tablets before samples run out.

## 2022-08-09 ENCOUNTER — Other Ambulatory Visit: Payer: Self-pay | Admitting: Family Medicine

## 2022-08-09 DIAGNOSIS — E1129 Type 2 diabetes mellitus with other diabetic kidney complication: Secondary | ICD-10-CM

## 2022-08-09 MED ORDER — SEMAGLUTIDE 7 MG PO TABS: 1.0000 | ORAL_TABLET | Freq: Every day | ORAL | 1 refills | Status: DC

## 2022-08-14 DIAGNOSIS — E119 Type 2 diabetes mellitus without complications: Secondary | ICD-10-CM | POA: Diagnosis not present

## 2022-08-14 DIAGNOSIS — I2510 Atherosclerotic heart disease of native coronary artery without angina pectoris: Secondary | ICD-10-CM | POA: Diagnosis not present

## 2022-08-14 DIAGNOSIS — Z951 Presence of aortocoronary bypass graft: Secondary | ICD-10-CM | POA: Diagnosis not present

## 2022-08-14 DIAGNOSIS — Z955 Presence of coronary angioplasty implant and graft: Secondary | ICD-10-CM | POA: Diagnosis not present

## 2022-08-14 DIAGNOSIS — I10 Essential (primary) hypertension: Secondary | ICD-10-CM | POA: Diagnosis not present

## 2022-08-14 DIAGNOSIS — E782 Mixed hyperlipidemia: Secondary | ICD-10-CM | POA: Diagnosis not present

## 2022-08-14 NOTE — Addendum Note (Signed)
Addended by: [name] on: 08/14/2022 04:57 PM   Modules accepted: Orders

## 2022-08-31 ENCOUNTER — Other Ambulatory Visit: Payer: Self-pay | Admitting: Family Medicine

## 2022-08-31 DIAGNOSIS — R52 Pain, unspecified: Secondary | ICD-10-CM

## 2022-08-31 DIAGNOSIS — M5416 Radiculopathy, lumbar region: Secondary | ICD-10-CM

## 2022-08-31 DIAGNOSIS — M5430 Sciatica, unspecified side: Secondary | ICD-10-CM

## 2022-08-31 MED ORDER — OXYCODONE-ACETAMINOPHEN 10-325 MG PO TABS: 1.0000 | ORAL_TABLET | Freq: Three times a day (TID) | ORAL | 0 refills | Status: DC | PRN

## 2022-08-31 NOTE — Telephone Encounter (Signed)
Medication Refill - Medication: oxyCODONE-acetaminophen (PERCOCET) 10-325 MG tablet  Has the patient contacted their pharmacy? No. Preferred Pharmacy (with phone number or street name): [facility] [place] ([facility]), [place] [address]  Has the patient been seen for an appointment in the last year OR does the patient have an upcoming appointment? Yes.    Agent: Please be advised that RX refills may take up to 3 business days. We ask that you follow-up with your pharmacy.

## 2022-08-31 NOTE — Telephone Encounter (Signed)
Requested medication (s) are due for refill today - yes  Requested medication (s) are on the active medication list -yes  Future visit scheduled -yes  Last refill: 08/02/22 #60  Notes to clinic: non delegated Rx  Requested Prescriptions  Pending Prescriptions Disp Refills   oxyCODONE-acetaminophen (PERCOCET) 10-325 MG tablet 60 tablet 0    Sig: Take 1 tablet by mouth every 8 (eight) hours as needed for pain.     Not Delegated - Analgesics:  Opioid Agonist Combinations Failed - 08/31/2022 11:14 AM      Failed - This refill cannot be delegated      Failed - Urine Drug Screen completed in last 360 days      Passed - Valid encounter within last 3 months    Recent Outpatient Visits           1 month ago Type 2 diabetes mellitus with microalbuminuria, without long-term current use of insulin [facility])   [facility] [name], MD   8 months ago Annual physical exam   [facility] [name], MD   1 year ago Iron deficiency anemia, unspecified iron deficiency anemia type   [facility] [name], MD   1 year ago Type 2 diabetes mellitus with microalbuminuria, without long-term current use of insulin [facility])   [facility] [name], MD   1 year ago Type 2 diabetes mellitus with microalbuminuria, without long-term current use of insulin [facility])   [facility] [name], MD       Future Appointments             In 1 month Fisher, Kirstie Peri, MD [facility]               Requested Prescriptions  Pending Prescriptions Disp Refills   oxyCODONE-acetaminophen (PERCOCET) 10-325 MG tablet 60 tablet 0    Sig: Take 1 tablet by mouth every 8 (eight) hours as needed for pain.     Not Delegated - Analgesics:  Opioid Agonist Combinations Failed - 08/31/2022 11:14 AM      Failed - This refill cannot be delegated      Failed - Urine Drug Screen completed in last 360 days       Passed - Valid encounter within last 3 months    Recent Outpatient Visits           1 month ago Type 2 diabetes mellitus with microalbuminuria, without long-term current use of insulin [facility])   [facility] [name], MD   8 months ago Annual physical exam   [facility] [name], MD   1 year ago Iron deficiency anemia, unspecified iron deficiency anemia type   [facility] [name], MD   1 year ago Type 2 diabetes mellitus with microalbuminuria, without long-term current use of insulin [facility])   [facility] [name], MD   1 year ago Type 2 diabetes mellitus with microalbuminuria, without long-term current use of insulin [facility])   [facility] [name], MD       Future Appointments             In 1 month Fisher, Kirstie Peri, MD [facility] [place]

## 2022-09-29 ENCOUNTER — Telehealth: Payer: Self-pay

## 2022-09-29 NOTE — Progress Notes (Signed)
[facility]) Care Management  [place]   09/29/2022  Craig York 1951/01/03 [id number]   2024 Medication Assistance Renewal Application Summary:  Patient was outreached by [place] Team regarding medication assistance renewal for 2024. Unable to reach today. Will follow up with patient in 2-3 business days.   Thank you for allowing pharmacy to be a part of this patient's care.   Kristeen Miss, PharmD Clinical Pharmacist [place] Cell: [phone]

## 2022-10-02 ENCOUNTER — Other Ambulatory Visit: Payer: Self-pay | Admitting: Family Medicine

## 2022-10-02 ENCOUNTER — Telehealth: Payer: Self-pay

## 2022-10-02 ENCOUNTER — Telehealth: Payer: Self-pay | Admitting: Family Medicine

## 2022-10-02 DIAGNOSIS — R52 Pain, unspecified: Secondary | ICD-10-CM

## 2022-10-02 DIAGNOSIS — M5416 Radiculopathy, lumbar region: Secondary | ICD-10-CM

## 2022-10-02 DIAGNOSIS — M5430 Sciatica, unspecified side: Secondary | ICD-10-CM

## 2022-10-02 MED ORDER — OXYCODONE-ACETAMINOPHEN 10-325 MG PO TABS: 1.0000 | ORAL_TABLET | Freq: Three times a day (TID) | ORAL | 0 refills | Status: DC | PRN

## 2022-10-02 NOTE — Telephone Encounter (Signed)
Requested medication (s) are due for refill today - yes  Requested medication (s) are on the active medication list -yes  Future visit scheduled -yes  Last refill: 08/31/22 #60  Notes to clinic: non delegated Rx  Requested Prescriptions  Pending Prescriptions Disp Refills   oxyCODONE-acetaminophen (PERCOCET) 10-325 MG tablet 60 tablet 0    Sig: Take 1 tablet by mouth every 8 (eight) hours as needed for pain.     Not Delegated - Analgesics:  Opioid Agonist Combinations Failed - 10/02/2022 11:23 AM      Failed - This refill cannot be delegated      Failed - Urine Drug Screen completed in last 360 days      Passed - Valid encounter within last 3 months    Recent Outpatient Visits           2 months ago Type 2 diabetes mellitus with microalbuminuria, without long-term current use of insulin [facility])   [facility] [name], MD   9 months ago Annual physical exam   [facility] [name], MD   1 year ago Iron deficiency anemia, unspecified iron deficiency anemia type   [facility] [name], MD   1 year ago Type 2 diabetes mellitus with microalbuminuria, without long-term current use of insulin [facility])   [facility] [name], MD   1 year ago Type 2 diabetes mellitus with microalbuminuria, without long-term current use of insulin [facility])   [facility] [name], MD       Future Appointments             In 1 week Fisher, Kirstie Peri, MD [facility]               Requested Prescriptions  Pending Prescriptions Disp Refills   oxyCODONE-acetaminophen (PERCOCET) 10-325 MG tablet 60 tablet 0    Sig: Take 1 tablet by mouth every 8 (eight) hours as needed for pain.     Not Delegated - Analgesics:  Opioid Agonist Combinations Failed - 10/02/2022 11:23 AM      Failed - This refill cannot be delegated      Failed - Urine Drug Screen completed in last 360 days       Passed - Valid encounter within last 3 months    Recent Outpatient Visits           2 months ago Type 2 diabetes mellitus with microalbuminuria, without long-term current use of insulin [facility])   [facility] [name], MD   9 months ago Annual physical exam   [facility] [name], MD   1 year ago Iron deficiency anemia, unspecified iron deficiency anemia type   [facility] [name], MD   1 year ago Type 2 diabetes mellitus with microalbuminuria, without long-term current use of insulin [facility])   [facility] [name], MD   1 year ago Type 2 diabetes mellitus with microalbuminuria, without long-term current use of insulin [facility])   [facility] [name], MD       Future Appointments             In 1 week Fisher, Kirstie Peri, MD [facility]

## 2022-10-02 NOTE — Progress Notes (Signed)
[facility] [facility]) Care Management  [place]   10/02/2022  CALUB TARNOW 06/16/1951 [id number]   2024 Medication Assistance Renewal Application Summary:  Patient was outreached by [facility] Team regarding medication assistance renewal for 2024 for Jardiance. Per chart review, Vania Rea was discontinued and patient is on Rybelsus 7 mg. Per discussion with patient, he stopped Rybelsus due to weight loss and resumed Jardiance. This pharmacist called the clinic, with Mr. Duddy on the phone, to clarify current regimen and spoke with Mary-Ann. Patient was very clear that he would like to remain on Jardiance and no longer wishes to take Rybelsus due to weight loss.    Thank you for allowing pharmacy to be a part of this patient's care.  Kristeen Miss, PharmD Clinical Pharmacist [place] Cell: [phone]

## 2022-10-02 NOTE — Telephone Encounter (Unsigned)
error 

## 2022-10-02 NOTE — Telephone Encounter (Signed)
Medication Refill - Medication: oxyCODONE-acetaminophen (PERCOCET) 10-325 MG tablet   Has the patient contacted their pharmacy? No.  Preferred Pharmacy (with phone number or street name): [place] ([facility]), [place]  Has the patient been seen for an appointment in the last year OR does the patient have an upcoming appointment? Yes.    Agent: Please be advised that RX refills may take up to 3 business days. We ask that you follow-up with your pharmacy.

## 2022-10-06 ENCOUNTER — Encounter: Payer: Self-pay | Admitting: Pharmacist

## 2022-10-06 NOTE — Progress Notes (Signed)
                                         [facility])                                            [place] Team                                        Statin Quality Measure Assessment    10/06/2022  LERON STOFFERS 1951/03/30 [id number]  Dr. Caryn Section,   I am a [facility] clinical pharmacist that reviews patients for statin quality initiatives.     Per review of chart and payor information, Mr. Dani has a diagnosis of diabetes but is not currently filling a statin prescription.  This places him into the SUPD (Statin Use In Patients with Diabetes) measure for CMS.    Patient has documented allergy to statin but no corresponding CPT codes that would exclude patient from SUPD measure. If clinically appropriate, please consider evaluating his statin tolerance at [facility] office visit.  Code for past statin intolerance or  other exclusions (required annually)  Provider Requirements: Associate code during an office visit or telehealth encounter  Drug Induced Myopathy G72.0   Myopathy, unspecified G72.9   Myositis, unspecified M60.9   Rhabdomyolysis G25.42   Alcoholic fatty liver H06.2   Cirrhosis of liver K74.69   Prediabetes R73.03   Thank you for your time and consideration, Curlene Labrum, PharmD [place] Pharmacist Office: [phone]

## 2022-10-10 ENCOUNTER — Encounter: Payer: Self-pay | Admitting: Family Medicine

## 2022-10-10 ENCOUNTER — Ambulatory Visit (INDEPENDENT_AMBULATORY_CARE_PROVIDER_SITE_OTHER): Payer: Medicare HMO | Admitting: Family Medicine

## 2022-10-10 VITALS — BP 137/94 | HR 59 | Temp 98.0°F | Resp 18 | Wt 184.5 lb

## 2022-10-10 DIAGNOSIS — E1129 Type 2 diabetes mellitus with other diabetic kidney complication: Secondary | ICD-10-CM | POA: Diagnosis not present

## 2022-10-10 DIAGNOSIS — I25118 Atherosclerotic heart disease of native coronary artery with other forms of angina pectoris: Secondary | ICD-10-CM

## 2022-10-10 DIAGNOSIS — R809 Proteinuria, unspecified: Secondary | ICD-10-CM

## 2022-10-10 DIAGNOSIS — E785 Hyperlipidemia, unspecified: Secondary | ICD-10-CM

## 2022-10-10 LAB — POCT GLYCOSYLATED HEMOGLOBIN (HGB A1C)
Est. average glucose Bld gHb Est-mCnc: 246
Hemoglobin A1C: 10.2 % — AB (ref 4.0–5.6)

## 2022-10-10 MED ORDER — ROSUVASTATIN CALCIUM 20 MG PO TABS: 20.0000 mg | ORAL_TABLET | Freq: Every day | ORAL | 1 refills | Status: DC

## 2022-10-10 MED ORDER — GLIPIZIDE ER 5 MG PO TB24
5.0000 mg | ORAL_TABLET | Freq: Every day | ORAL | 1 refills | Status: DC
Start: 2022-10-10 — End: 2023-02-07

## 2022-10-10 NOTE — Progress Notes (Signed)
I,[name],acting as a scribe for Lelon Huh, MD.,have documented all relevant documentation on the behalf of Lelon Huh, MD,as directed by  Lelon Huh, MD while in the presence of Lelon Huh, MD.   Established patient visit   Patient: Craig York   DOB: 1951/04/03   71 y.o. Male  MRN: [id number] Visit Date: 10/10/2022  Today's healthcare provider: Lelon Huh, MD   Chief Complaint  Patient presents with   Diabetes   Subjective    HPI  Diabetes Mellitus Type II, Follow-up  Lab Results  Component Value Date   HGBA1C 10.2 (A) 10/10/2022   HGBA1C 8.8 (A) 07/10/2022   HGBA1C 7.5 (H) 12/21/2021   Wt Readings from Last 3 Encounters:  10/10/22 184 lb 8 oz (83.7 kg)  07/10/22 185 lb (83.9 kg)  12/21/21 189 lb (85.7 kg)   Last seen for diabetes 3 months ago.  Management since then includes trying samples Semaglutide (RYBELSUS) 3 MG TABS; Take 3 mg by mouth daily . He reports poor compliance with treatment. He is having side effects. Weight loss- Patient stopped taking Rybelsus due to it causing weight loss. He had previously been taking Jardiance, alternating between 10 and 73m based on sample availability. He seems to have run out but doesn't recall having any problems with it.  Symptoms: No fatigue No foot ulcerations  No appetite changes No nausea  No paresthesia of the feet  No polydipsia  No polyuria No visual disturbances   No vomiting     Home blood sugar records:  blood sugars are not checked  Episodes of hypoglycemia? No    Current insulin regiment: none Most Recent Eye Exam: not UTD Current exercise: walking Current diet habits: well balanced  Pertinent Labs: Lab Results  Component Value Date   CHOL 172 12/21/2021   HDL 42 12/21/2021   LDLCALC 108 (H) 12/21/2021   TRIG 119 12/21/2021   CHOLHDL 4.1 12/21/2021   Lab Results  Component Value Date   NA 143 12/21/2021   K 4.5 12/21/2021   CREATININE 0.97 12/21/2021   EGFR  84 12/21/2021   MICROALBUR negative 12/27/2020   LABMICR 9.4 12/21/2021     ---------------------------------------------------------------------------------------------------   Medications: Outpatient Medications Prior to Visit  Medication Sig Note   acetaminophen (TYLENOL) 500 MG tablet Take 1,000 mg by mouth every 6 (six) hours as needed for moderate pain.    aspirin EC 81 MG tablet Take 81 mg by mouth daily.    ferrous sulfate 325 (65 FE) MG tablet Take 1 tablet (325 mg total) by mouth 2 (two) times daily with a meal.    losartan (COZAAR) 50 MG tablet Take 50 mg by mouth daily.    metFORMIN (GLUCOPHAGE) 500 MG tablet TAKE 2 TABLETS BY MOUTH TWICE DAILY WITH A MEAL    metoprolol succinate (TOPROL-XL) 25 MG 24 hr tablet Take 1 tablet by mouth once daily    oxyCODONE-acetaminophen (PERCOCET) 10-325 MG tablet Take 1 tablet by mouth every 8 (eight) hours as needed for pain.    pantoprazole (PROTONIX) 40 MG tablet Take 1 tablet by mouth once daily    SYSTANE COMPLETE 0.6 % SOLN Place 1 drop into both eyes 3 (three) times daily as needed (dry/irritated eyes.).    tadalafil (CIALIS) 20 MG tablet Take 1 tablet (20 mg total) by mouth daily as needed for erectile dysfunction.    Semaglutide 7 MG TABS Take 1 tablet by mouth daily. (Patient not taking: Reported on 10/10/2022)  10/10/2022: Stopped taking   No facility-administered medications prior to visit.    Review of Systems  Constitutional:  Negative for appetite change, chills and fever.  Respiratory:  Negative for chest tightness, shortness of breath and wheezing.   Cardiovascular:  Negative for chest pain and palpitations.  Gastrointestinal:  Negative for abdominal pain, nausea and vomiting.       Objective    BP (!) 137/94 (BP Location: Left Arm, Patient Position: Sitting, Cuff Size: Normal)   Pulse (!) 59   Temp 98 F (36.7 C) (Oral)   Resp 18   Wt 184 lb 8 oz (83.7 kg)   SpO2 99% Comment: room  air  BMI 24.34 kg/m     Today's Vitals   10/10/22 1116 10/10/22 1119  BP: (!) 141/77 (!) 137/94  Pulse: 66 (!) 59  Resp: 18   Temp: 98 F (36.7 C)   TempSrc: Oral   SpO2: 99%   Weight: 184 lb 8 oz (83.7 kg)    Body mass index is 24.34 kg/m.   Physical Exam   General appearance: Well developed, well nourished male, cooperative and in no acute distress Head: Normocephalic, without obvious abnormality, atraumatic Respiratory: Respirations even and unlabored, normal respiratory rate Extremities: All extremities are intact.  Skin: Skin color, texture, turgor normal. No rashes seen  Psych: Appropriate mood and affect. Neurologic: Mental status: Alert, oriented to person, place, and time, thought content appropriate.   Results for orders placed or performed in visit on 10/10/22  POCT HgB A1C  Result Value Ref Range   Hemoglobin A1C 10.2 (A) 4.0 - 5.6 %   Est. average glucose Bld gHb Est-mCnc 246     Assessment & Plan     1. Type 2 diabetes mellitus with microalbuminuria, without long-term current use of insulin (HCC) He stopped Rybelsus because he was losing weight and felt weaker in his legs when he was taking it. He had also been on Jardiance for several months and apparently ran out at some point. He apparently had been getting patient assistance for this and will see if we can get them resumed.   Had also been on 73m glipizide in the past which he tolerated well. Will resume at lower dose of glipiZIDE (GLUCOTROL XL) 5 MG 24 hr tablet; Take 1 tablet (5 mg total) by mouth daily with breakfast.  Dispense: 90 tablet; Refill: 1   2. Hyperlipidemia, unspecified hyperlipidemia type Previous on rosuvastatin prescribed by cardiology which was also discontinued at some point for unclear reasons. Will resume and follow up in about 3 months.  - rosuvastatin (CRESTOR) 20 MG tablet; Take 1 tablet (20 mg total) by mouth daily.  Dispense: 90 tablet; Refill: 1  3. Atherosclerotic heart disease of native  coronary artery with other forms of angina pectoris ([place] Asymptomatic. Compliant with medication.  Continue aggressive risk factor modification.  Resume statin as above.       The entirety of the information documented in the History of Present Illness, Review of Systems and Physical Exam were personally obtained by me. Portions of this information were initially documented by the CMA and reviewed by me for thoroughness and accuracy.     DLelon Huh MD  [phone](phone) [phone](fax)  [place]

## 2022-10-12 DIAGNOSIS — E113393 Type 2 diabetes mellitus with moderate nonproliferative diabetic retinopathy without macular edema, bilateral: Secondary | ICD-10-CM | POA: Diagnosis not present

## 2022-10-16 ENCOUNTER — Encounter: Payer: Self-pay | Admitting: Family Medicine

## 2022-10-27 ENCOUNTER — Telehealth: Payer: Self-pay | Admitting: Pharmacy Technician

## 2022-10-27 DIAGNOSIS — Z596 Low income: Secondary | ICD-10-CM

## 2022-10-27 NOTE — Progress Notes (Signed)
[place] [facility])                                            [facility] Team    10/27/2022  Craig York 10-06-1951 [id number]                                      Medication Assistance Referral-FOR 2024 RE ENROLLMENT  Referral From:  [facility]  Kristeen Miss  Medication/Company: [employer] / [employer] Patient application portion:  Mailed Provider application portion: Faxed  to Dr. Lelon Huh Provider address/fax verified via: Office website    Aleeha Boline P. Leila Schuff, [place]  [phone]

## 2022-11-01 ENCOUNTER — Other Ambulatory Visit: Payer: Self-pay | Admitting: Family Medicine

## 2022-11-01 DIAGNOSIS — M5416 Radiculopathy, lumbar region: Secondary | ICD-10-CM

## 2022-11-01 DIAGNOSIS — R52 Pain, unspecified: Secondary | ICD-10-CM

## 2022-11-01 DIAGNOSIS — M5430 Sciatica, unspecified side: Secondary | ICD-10-CM

## 2022-11-01 NOTE — Telephone Encounter (Signed)
Medication Refill - Medication: oxyCODONE-acetaminophen (PERCOCET) 10-325 MG tablet   Has the patient contacted their pharmacy? No.  Preferred Pharmacy (with phone number or street name): [place] ([facility] [place] [address]  Has the patient been seen for an appointment in the last year OR does the patient have an upcoming appointment? Yes.    Agent: Please be advised that RX refills may take up to 3 business days. We ask that you follow-up with your pharmacy.

## 2022-11-01 NOTE — Telephone Encounter (Signed)
Requested medication (s) are due for refill today: yes  Requested medication (s) are on the active medication list: yes  Last refill:  10/02/22 #60  Future visit scheduled: yes  Notes to clinic:  med not delegated to NT to RF   Requested Prescriptions  Pending Prescriptions Disp Refills   oxyCODONE-acetaminophen (PERCOCET) 10-325 MG tablet 60 tablet 0    Sig: Take 1 tablet by mouth every 8 (eight) hours as needed for pain.     Not Delegated - Analgesics:  Opioid Agonist Combinations Failed - 11/01/2022  9:46 AM      Failed - This refill cannot be delegated      Failed - Urine Drug Screen completed in last 360 days      Passed - Valid encounter within last 3 months    Recent Outpatient Visits           3 weeks ago Type 2 diabetes mellitus with microalbuminuria, without long-term current use of insulin ([place])   [facility] [name], MD   3 months ago Type 2 diabetes mellitus with microalbuminuria, without long-term current use of insulin [facility])   [facility] [name], MD   10 months ago Annual physical exam   [facility] [name], MD   1 year ago Iron deficiency anemia, unspecified iron deficiency anemia type   [facility] [name], MD   1 year ago Type 2 diabetes mellitus with microalbuminuria, without long-term current use of insulin [facility])   [facility] [name], MD       Future Appointments             In 2 months Fisher, Kirstie Peri, MD [facility] [place]

## 2022-11-02 MED ORDER — OXYCODONE-ACETAMINOPHEN 10-325 MG PO TABS: 1.0000 | ORAL_TABLET | Freq: Three times a day (TID) | ORAL | 0 refills | Status: DC | PRN

## 2022-12-01 ENCOUNTER — Other Ambulatory Visit: Payer: Self-pay | Admitting: Family Medicine

## 2022-12-01 DIAGNOSIS — M5430 Sciatica, unspecified side: Secondary | ICD-10-CM

## 2022-12-01 DIAGNOSIS — M5416 Radiculopathy, lumbar region: Secondary | ICD-10-CM

## 2022-12-01 DIAGNOSIS — R52 Pain, unspecified: Secondary | ICD-10-CM

## 2022-12-01 NOTE — Telephone Encounter (Signed)
Medication Refill - Medication: oxyCODONE-acetaminophen (PERCOCET) 10-325 MG tablet   Has the patient contacted their pharmacy? yes (Agent: If no, request that the patient contact the pharmacy for the refill. If patient does not wish to contact the pharmacy document the reason why and proceed with request.) (Agent: If yes, when and what did the pharmacy advise?)contact pcp  Preferred Pharmacy (with phone number or street name):  [facility] [place] - [address] Phone: [phone]  Fax: [phone]     Has the patient been seen for an appointment in the last year OR does the patient have an upcoming appointment? yes  Agent: Please be advised that RX refills may take up to 3 business days. We ask that you follow-up with your pharmacy.

## 2022-12-01 NOTE — Telephone Encounter (Signed)
Requested medication (s) are due for refill today: yes  Requested medication (s) are on the active medication list: yes  Last refill:  11/02/22 #60 0 refills  Future visit scheduled: yes in 1 month  Notes to clinic:  not delegated per protocol. Do you want to refill Rx?     Requested Prescriptions  Pending Prescriptions Disp Refills   oxyCODONE-acetaminophen (PERCOCET) 10-325 MG tablet 60 tablet 0    Sig: Take 1 tablet by mouth every 8 (eight) hours as needed for pain.     Not Delegated - Analgesics:  Opioid Agonist Combinations Failed - 12/01/2022 11:10 AM      Failed - This refill cannot be delegated      Failed - Urine Drug Screen completed in last 360 days      Passed - Valid encounter within last 3 months    Recent Outpatient Visits           1 month ago Type 2 diabetes mellitus with microalbuminuria, without long-term current use of insulin [facility])   [facility] [name], MD   4 months ago Type 2 diabetes mellitus with microalbuminuria, without long-term current use of insulin [facility])   [facility] [name], MD   11 months ago Annual physical exam   [facility] [name], MD   1 year ago Iron deficiency anemia, unspecified iron deficiency anemia type   [facility] [name], MD   1 year ago Type 2 diabetes mellitus with microalbuminuria, without long-term current use of insulin [facility])   [facility] [name], MD       Future Appointments             In 1 month Fisher, Kirstie Peri, MD [facility] [place]

## 2022-12-02 MED ORDER — OXYCODONE-ACETAMINOPHEN 10-325 MG PO TABS: 1.0000 | ORAL_TABLET | Freq: Three times a day (TID) | ORAL | 0 refills | Status: DC | PRN

## 2022-12-22 ENCOUNTER — Telehealth: Payer: Self-pay

## 2022-12-22 NOTE — Telephone Encounter (Signed)
Spoke with Sharee Pimple, she will notify patient.

## 2022-12-22 NOTE — Telephone Encounter (Signed)
Copied from [place] [phone]. Topic: General - Inquiry >> Dec 21, 2022  3:15 PM Ludger Nutting wrote: Sharee Pimple with [facility] called to see if patient left his portion of the Medication Assistance Referral-FOR 2024 RE for Jardiance in the office at his last appointment on 10/10/22. Patient said he left paperwork with provider. Please follow up with Sharee Pimple.

## 2022-12-22 NOTE — Telephone Encounter (Signed)
I haven't seen it.

## 2022-12-25 ENCOUNTER — Telehealth: Payer: Self-pay | Admitting: Family Medicine

## 2022-12-25 ENCOUNTER — Ambulatory Visit (INDEPENDENT_AMBULATORY_CARE_PROVIDER_SITE_OTHER): Payer: Medicare HMO

## 2022-12-25 VITALS — BP 138/70 | Ht 73.0 in | Wt 188.5 lb

## 2022-12-25 DIAGNOSIS — Z0000 Encounter for general adult medical examination without abnormal findings: Secondary | ICD-10-CM

## 2022-12-25 NOTE — Telephone Encounter (Signed)
Sending back forms from [employer] that need completing for Jardiance.   Fax to # provided on form.

## 2022-12-25 NOTE — Patient Instructions (Signed)
Craig York , Thank you for taking time to come for your Medicare Wellness Visit. I appreciate your ongoing commitment to your health goals. Please review the following plan we discussed and let me know if I can assist you in the future.   These are the goals we discussed:  Goals      DIET - INCREASE WATER INTAKE        This is a list of the screening recommended for you and due dates:  Health Maintenance  Topic Date Due   Zoster (Shingles) Vaccine (1 of 2) Never done   Pneumonia Vaccine (2 - PCV) 08/25/2020   Complete foot exam   04/29/2021   Eye exam for diabetics  05/03/2022   COVID-19 Vaccine (4 - 2023-24 season) 07/28/2022   Yearly kidney function blood test for diabetes  12/21/2022   Yearly kidney health urinalysis for diabetes  12/21/2022   Flu Shot  02/25/2023*   Hemoglobin A1C  04/10/2023   Medicare Annual Wellness Visit  12/26/2023   Colon Cancer Screening  07/27/2026   DTaP/Tdap/Td vaccine (2 - Td or Tdap) 05/29/2027   Hepatitis C Screening: USPSTF Recommendation to screen - Ages 60-79 yo.  Completed   HPV Vaccine  Aged Out  *Topic was postponed. The date shown is not the original due date.    Advanced directives: no  Conditions/risks identified: none  Next appointment: Follow up in one year for your annual wellness visit 12/31/23 @ 9:15 am in person   Preventive Care 65 Years and Older, Male Preventive care refers to lifestyle choices and visits with your health care provider that can promote health and wellness. What does preventive care include? A yearly physical exam. This is also called an annual well check. Dental exams once or twice a year. Routine eye exams. Ask your health care provider how often you should have your eyes checked. Personal lifestyle choices, including: Daily care of your teeth and gums. Regular physical activity. Eating a healthy diet. Avoiding tobacco and drug use. Limiting alcohol use. Practicing safe sex. Taking low-dose  aspirin every day. Taking vitamin and mineral supplements as recommended by your health care provider. What happens during an annual well check? The services and screenings done by your health care provider during your annual well check will depend on your age, overall health, lifestyle risk factors, and family history of disease. Counseling  Your health care provider may ask you questions about your: Alcohol use. Tobacco use. Drug use. Emotional well-being. Home and relationship well-being. Sexual activity. Eating habits. History of falls. Memory and ability to understand (cognition). Work and work Statistician. Reproductive health. Screening  You may have the following tests or measurements: Height, weight, and BMI. Blood pressure. Lipid and cholesterol levels. These may be checked every 5 years, or more frequently if you are over 64 years old. Skin check. Lung cancer screening. You may have this screening every year starting at age 55 if you have a 30-pack-year history of smoking and currently smoke or have quit within the past 15 years. Fecal occult blood test (FOBT) of the stool. You may have this test every year starting at age 27. Flexible sigmoidoscopy or colonoscopy. You may have a sigmoidoscopy every 5 years or a colonoscopy every 10 years starting at age 29. Hepatitis C blood test. Hepatitis B blood test. Sexually transmitted disease (STD) testing. Diabetes screening. This is done by checking your blood sugar (glucose) after you have not eaten for a while (fasting). You may have this  done every 1-3 years. Bone density scan. This is done to screen for osteoporosis. You may have this done starting at age 76. Mammogram. This may be done every 1-2 years. Talk to your health care provider about how often you should have regular mammograms. Talk with your health care provider about your test results, treatment options, and if necessary, the need for more tests. Vaccines  Your  health care provider may recommend certain vaccines, such as: Influenza vaccine. This is recommended every year. Tetanus, diphtheria, and acellular pertussis (Tdap, Td) vaccine. You may need a Td booster every 10 years. Zoster vaccine. You may need this after age 62. Pneumococcal 13-valent conjugate (PCV13) vaccine. One dose is recommended after age 53. Pneumococcal polysaccharide (PPSV23) vaccine. One dose is recommended after age 74. Talk to your health care provider about which screenings and vaccines you need and how often you need them. This information is not intended to replace advice given to you by your health care provider. Make sure you discuss any questions you have with your health care provider. Document Released: 12/10/2015 Document Revised: 08/02/2016 Document Reviewed: 09/14/2015 Elsevier Interactive Patient Education  2017 [place] Prevention in the Home Falls can cause injuries. They can happen to people of all ages. There are many things you can do to make your home safe and to help prevent falls. What can I do on the outside of my home? Regularly fix the edges of walkways and driveways and fix any cracks. Remove anything that might make you trip as you walk through a door, such as a raised step or threshold. Trim any bushes or trees on the path to your home. Use bright outdoor lighting. Clear any walking paths of anything that might make someone trip, such as rocks or tools. Regularly check to see if handrails are loose or broken. Make sure that both sides of any steps have handrails. Any raised decks and porches should have guardrails on the edges. Have any leaves, snow, or ice cleared regularly. Use sand or salt on walking paths during winter. Clean up any spills in your garage right away. This includes oil or grease spills. What can I do in the bathroom? Use night lights. Install grab bars by the toilet and in the tub and shower. Do not use towel bars as  grab bars. Use non-skid mats or decals in the tub or shower. If you need to sit down in the shower, use a plastic, non-slip stool. Keep the floor dry. Clean up any water that spills on the floor as soon as it happens. Remove soap buildup in the tub or shower regularly. Attach bath mats securely with double-sided non-slip rug tape. Do not have throw rugs and other things on the floor that can make you trip. What can I do in the bedroom? Use night lights. Make sure that you have a light by your bed that is easy to reach. Do not use any sheets or blankets that are too big for your bed. They should not hang down onto the floor. Have a firm chair that has side arms. You can use this for support while you get dressed. Do not have throw rugs and other things on the floor that can make you trip. What can I do in the kitchen? Clean up any spills right away. Avoid walking on wet floors. Keep items that you use a lot in easy-to-reach places. If you need to reach something above you, use a strong step stool that  has a grab bar. Keep electrical cords out of the way. Do not use floor polish or wax that makes floors slippery. If you must use wax, use non-skid floor wax. Do not have throw rugs and other things on the floor that can make you trip. What can I do with my stairs? Do not leave any items on the stairs. Make sure that there are handrails on both sides of the stairs and use them. Fix handrails that are broken or loose. Make sure that handrails are as long as the stairways. Check any carpeting to make sure that it is firmly attached to the stairs. Fix any carpet that is loose or worn. Avoid having throw rugs at the top or bottom of the stairs. If you do have throw rugs, attach them to the floor with carpet tape. Make sure that you have a light switch at the top of the stairs and the bottom of the stairs. If you do not have them, ask someone to add them for you. What else can I do to help prevent  falls? Wear shoes that: Do not have high heels. Have rubber bottoms. Are comfortable and fit you well. Are closed at the toe. Do not wear sandals. If you use a stepladder: Make sure that it is fully opened. Do not climb a closed stepladder. Make sure that both sides of the stepladder are locked into place. Ask someone to hold it for you, if possible. Clearly mark and make sure that you can see: Any grab bars or handrails. First and last steps. Where the edge of each step is. Use tools that help you move around (mobility aids) if they are needed. These include: Canes. Walkers. Scooters. Crutches. Turn on the lights when you go into a dark area. Replace any light bulbs as soon as they burn out. Set up your furniture so you have a clear path. Avoid moving your furniture around. If any of your floors are uneven, fix them. If there are any pets around you, be aware of where they are. Review your medicines with your doctor. Some medicines can make you feel dizzy. This can increase your chance of falling. Ask your doctor what other things that you can do to help prevent falls. This information is not intended to replace advice given to you by your health care provider. Make sure you discuss any questions you have with your health care provider. Document Released: 09/09/2009 Document Revised: 04/20/2016 Document Reviewed: 12/18/2014 Elsevier Interactive Patient Education  2017 [employer].

## 2022-12-25 NOTE — Progress Notes (Signed)
Subjective:   Craig York is a 72 y.o. male who presents for Medicare Annual/Subsequent preventive examination.  Review of Systems     Cardiac Risk Factors include: advanced age (>10mn, >>34women);diabetes mellitus;dyslipidemia;hypertension;male gender     Objective:    Today's Vitals   12/25/22 1104  BP: 138/70  Weight: 188 lb 8 oz (85.5 kg)  Height: '6\' 1"'$  (1.854 m)   Body mass index is 24.87 kg/m.     12/25/2022   11:12 AM 07/26/2021    1:12 PM 07/24/2021    8:29 PM 07/24/2021   12:48 PM 09/20/2020    3:58 PM 05/06/2020    6:11 PM 05/04/2020   10:27 AM  Advanced Directives  Does Patient Have a Medical Advance Directive? No No No No Yes Yes Yes  Type of Advance Directive     Living will [place] will  Does patient want to make changes to medical advance directive?      No - Patient declined No - Patient declined  Copy of [place] Chart?      No - copy requested No - copy requested  Would patient like information on creating a medical advance directive? No - Patient declined No - Patient declined No - Patient declined   No - Patient declined No - Patient declined    Current Medications (verified) Outpatient Encounter Medications as of 12/25/2022  Medication Sig   acetaminophen (TYLENOL) 500 MG tablet Take 1,000 mg by mouth every 6 (six) hours as needed for moderate pain.   aspirin EC 81 MG tablet Take 81 mg by mouth daily.   empagliflozin (JARDIANCE) 25 MG TABS tablet Take 25 mg by mouth daily.   ferrous sulfate 325 (65 FE) MG tablet Take 1 tablet (325 mg total) by mouth 2 (two) times daily with a meal.   glipiZIDE (GLUCOTROL XL) 5 MG 24 hr tablet Take 1 tablet (5 mg total) by mouth daily with breakfast.   losartan (COZAAR) 50 MG tablet Take 50 mg by mouth daily.   metFORMIN (GLUCOPHAGE) 500 MG tablet TAKE 2 TABLETS BY MOUTH TWICE DAILY WITH A MEAL   metoprolol succinate  (TOPROL-XL) 25 MG 24 hr tablet Take 1 tablet by mouth once daily   oxyCODONE-acetaminophen (PERCOCET) 10-325 MG tablet Take 1 tablet by mouth every 8 (eight) hours as needed for pain.   pantoprazole (PROTONIX) 40 MG tablet Take 1 tablet by mouth once daily   rosuvastatin (CRESTOR) 20 MG tablet Take 1 tablet (20 mg total) by mouth daily.   SYSTANE COMPLETE 0.6 % SOLN Place 1 drop into both eyes 3 (three) times daily as needed (dry/irritated eyes.).   tadalafil (CIALIS) 20 MG tablet Take 1 tablet (20 mg total) by mouth daily as needed for erectile dysfunction.   No facility-administered encounter medications on file as of 12/25/2022.    Allergies (verified) Simvastatin and Penicillins   History: Past Medical History:  Diagnosis Date   Anginal pain ([place]    Benign neoplasm of descending colon    BPH (benign prostatic hyperplasia)    Coronary artery disease    Diabetes mellitus without complication (HCC)    Type II   Hemorrhage of gastrointestinal tract 07/23/2008   Hyperlipidemia    Hypertension    Iron deficiency anemia    Myocardial infarction ([facility]    Myocardial infarction involving left anterior descending (LAD) coronary artery ([place] 12/25/2019   Polyp of sigmoid colon  Symptomatic anemia 04/12/2020   Past Surgical History:  Procedure Laterality Date   CARDIAC CATHETERIZATION     chest surgery for fungal infection     COLONOSCOPY WITH PROPOFOL N/A 07/10/2017   Procedure: COLONOSCOPY WITH PROPOFOL;  Surgeon: [name], MD;  Location[facility] ENDOSCOPY;  Service: Endoscopy;  Laterality: N/A;   COLONOSCOPY WITH PROPOFOL N/A 04/13/2020   Procedure: COLONOSCOPY WITH PROPOFOL;  Surgeon: [name], MD;  Location: [facility] ENDOSCOPY;  Service: Endoscopy;  Laterality: N/A;   COLONOSCOPY WITH PROPOFOL N/A 07/27/2021   Procedure: COLONOSCOPY WITH PROPOFOL;  Surgeon: Lesly Rubenstein, MD;  Location: [facility] ENDOSCOPY;  Service: Endoscopy;  Laterality: N/A;   CORONARY ARTERY BYPASS GRAFT N/A  08/12/2019   Procedure: CORONARY ARTERY BYPASS GRAFTING (CABG)X 4;  Surgeon: [name], MD;  Location: [place];  Service: Open Heart Surgery;  Laterality: N/A;  CABG x  4  using bilateral internal mammary arteries and endoscopically harvested left saphenous vein   ESOPHAGOGASTRODUODENOSCOPY N/A 07/26/2021   Procedure: ESOPHAGOGASTRODUODENOSCOPY (EGD);  Surgeon: Lesly Rubenstein, MD;  [facility];  Service: Endoscopy;  Laterality: N/A;   ESOPHAGOGASTRODUODENOSCOPY (EGD) WITH PROPOFOL N/A 07/10/2017   Procedure: ESOPHAGOGASTRODUODENOSCOPY (EGD) WITH PROPOFOL;  Surgeon: [name], MD;  Location: [facility] ENDOSCOPY;  Service: Endoscopy;  Laterality: N/A;   ESOPHAGOGASTRODUODENOSCOPY (EGD) WITH PROPOFOL N/A 04/13/2020   Procedure: ESOPHAGOGASTRODUODENOSCOPY (EGD) WITH PROPOFOL;  Surgeon: [name], MD;  Location: [facility] ENDOSCOPY;  Service: Endoscopy;  Laterality: N/A;   GIVENS CAPSULE STUDY N/A 04/14/2020   Procedure: GIVENS CAPSULE STUDY;  Surgeon: Jonathon Bellows, MD;  Location: [facility] ENDOSCOPY;  Service: Gastroenterology;  Laterality: N/A;   HERNIA REPAIR     INTERCOSTAL NERVE BLOCK Right 05/06/2020   Procedure: INTERCOSTAL NERVE BLOCK;  Surgeon: Lajuana Matte, MD;  Location: [place];  Service: Thoracic;  Laterality: Right;   LEFT HEART CATH AND CORONARY ANGIOGRAPHY Left 07/21/2019   Procedure: LEFT HEART CATH AND CORONARY ANGIOGRAPHY;  Surgeon: [name], MD;  Location: [place] CV LAB;  Service: Cardiovascular;  Laterality: Left;   TEE WITHOUT CARDIOVERSION N/A 08/12/2019   Procedure: TRANSESOPHAGEAL ECHOCARDIOGRAM (TEE);  Surgeon: [name], MD;  Location: [place];  Service: Open Heart Surgery;  Laterality: N/A;   Family History  Problem Relation Age of Onset   Hypertension Mother    Social History   Socioeconomic History   Marital status: Legally Separated    Spouse name: Not on file   Number of children: 2   Years of education: Not on file   Highest  education level: High school graduate  Occupational History   Occupation: Retired    Comment: previously worked in [employer] fund[employer] working on [place] Use   Smoking status: Former   Smokeless tobacco: Former    Types: Chew    Quit date: 11/27/1968   Tobacco comments:    used 2 packs per week; quit over 40 years ago  Vaping Use   Vaping Use: Never used  Substance and Sexual Activity   Alcohol use: Not Currently    Alcohol/week: 0.0 standard drinks of alcohol   Drug use: No   Sexual activity: Not on file  Other Topics Concern   Not on file  Social History Narrative   Not on file   Social Determinants of Health   Financial Resource Strain: [place]  (12/25/2022)   Overall Financial Resource Strain (CARDIA)    Difficulty of Paying Living Expenses: Not hard at all  Food Insecurity: No Food Insecurity (  12/25/2022)   Hunger Vital Sign    Worried About Running Out of Food in the Last Year: Never true    [place] in the Last Year: Never true  Transportation Needs: No Transportation Needs (12/25/2022)   PRAPARE - Hydrologist (Medical): No    Lack of Transportation (Non-Medical): No  Physical Activity: Insufficiently Active (12/25/2022)   Exercise Vital Sign    Days of Exercise per Week: 3 days    Minutes of Exercise per Session: 20 min  Stress: No Stress Concern Present (12/25/2022)   [place]    Feeling of Stress : Not at all  Social Connections: Moderately Integrated (12/25/2022)   Social Connection and Isolation Panel [NHANES]    Frequency of Communication with Friends and Family: Three times a week    Frequency of Social Gatherings with Friends and Family: Once a week    Attends Religious Services: More than 4 times per year    Active Member of [employer] or Organizations: No    Attends Music therapist: Never    Marital Status: Married    Tobacco  Counseling Counseling given: Not Answered Tobacco comments: used 2 packs per week; quit over 40 years ago   Clinical Intake:  Pre-visit preparation completed: Yes  Pain : No/denies pain     Nutritional Risks: None Diabetes: Yes CBG done?: No Did pt. bring in CBG monitor from home?: No  How often do you need to have someone help you when you read instructions, pamphlets, or other written materials from your doctor or pharmacy?: 1 - Never  Diabetic?yes Nutrition Risk Assessment:  Has the patient had any N/V/D within the last 2 months?  No  Does the patient have any non-healing wounds?  No  Has the patient had any unintentional weight loss or weight gain?  No   Diabetes:  Is the patient diabetic?  Yes  If diabetic, was a CBG obtained today?  No  Did the patient bring in their glucometer from home?  No  How often do you monitor your CBG's? occasionally.   Financial Strains and Diabetes Management:  Are you having any financial strains with the device, your supplies or your medication? No .  Does the patient want to be seen by Chronic Care Management for management of their diabetes?  No  Would the patient like to be referred to a Nutritionist or for Diabetic Management?  No   Diabetic Exams:  Diabetic Eye Exam: Completed October 2023. [name] Pt has been advised about the importance in completing this exam.  Diabetic Foot Exam: Completed 04/29/20. Pt has been advised about the importance in completing this exam.     Interpreter Needed?: No  Information entered by :: Kirke Shaggy, LPN   Activities of Daily Living    12/25/2022   11:13 AM  In your present state of health, do you have any difficulty performing the following activities:  Hearing? 0  Vision? 0  Difficulty concentrating or making decisions? 0  Walking or climbing stairs? 0  Dressing or bathing? 0  Doing errands, shopping? 0  Preparing Food and eating ? N  Using the Toilet? N  In the past six months, have  you accidently leaked urine? N  Do you have problems with loss of bowel control? N  Managing your Medications? N  Managing your Finances? N  Housekeeping or managing your Housekeeping? N    Patient  Care Team: [name], MD as PCP - General (Family Medicine) [name], MD as Consulting Physician (Cardiology) Lajuana Matte, MD as Consulting Physician (Thoracic Surgery) Lorelee Cover., MD (Ophthalmology)  Indicate any recent Medical Services you may have received from other than [facility] providers in the past year (date may be approximate).     Assessment:   This is a routine wellness examination for Craig York.  Hearing/Vision screen Hearing Screening - Comments:: No aids Vision Screening - Comments:: Wears glasses- Dr. Gloriann Loan  Dietary issues and exercise activities discussed: Current Exercise Habits: Home exercise routine, Type of exercise: walking;calisthenics, Time (Minutes): 30, Frequency (Times/Week): 3, Weekly Exercise (Minutes/Week): 90, Intensity: Mild   Goals Addressed             This Visit's Progress    DIET - INCREASE WATER INTAKE         Depression Screen    12/25/2022   11:11 AM 12/21/2021   10:14 AM 04/18/2021   11:12 AM 09/20/2020    3:54 PM 08/25/2020   11:27 AM 09/16/2019    1:38 PM 09/12/2018    2:02 PM  PHQ 2/9 Scores  PHQ - 2 Score 0 0 0 0 0 0 0  PHQ- 9 Score 0 0 0  0      Fall Risk    12/25/2022   11:13 AM 12/21/2021   10:15 AM 04/18/2021   11:12 AM 09/20/2020    3:58 PM 08/25/2020   11:27 AM  [place] in the past year? 0 0 0 0 0  Number falls in past yr: 0 0 0 0   Injury with Fall? 0 0 0 0   Risk for fall due to : No Fall Risks No Fall Risks     Follow up Falls prevention discussed;Falls evaluation completed Falls evaluation completed       FALL RISK PREVENTION PERTAINING TO THE HOME:  Any stairs in or around the home? No  If so, are there any without handrails? No  Home free of loose throw rugs in walkways,  pet beds, electrical cords, etc? Yes  Adequate lighting in your home to reduce risk of falls? Yes   ASSISTIVE DEVICES UTILIZED TO PREVENT FALLS:  Life alert? No  Use of a cane, walker or w/c? No  Grab bars in the bathroom? No  Shower chair or bench in shower? No  Elevated toilet seat or a handicapped toilet? No   TIMED UP AND GO:  Was the test performed? Yes .  Length of time to ambulate 10 feet: 4 sec.   Gait steady and fast without use of assistive device  Cognitive Function:        12/25/2022   11:14 AM  6CIT Screen  What Year? 0 points  What month? 0 points  What time? 0 points  Count back from 20 0 points  Months in reverse 0 points  Repeat phrase 0 points  Total Score 0 points    Immunizations Immunization History  Administered Date(s) Administered   PFIZER(Purple Top)SARS-COV-2 Vaccination 02/18/2020, 03/10/2020, 09/23/2020   Pneumococcal Polysaccharide-23 08/26/2019   Tdap 05/28/2017    TDAP status: Up to date  Flu Vaccine status: Declined, Education has been provided regarding the importance of this vaccine but patient still declined. Advised may receive this vaccine at local pharmacy or Health Dept. Aware to provide a copy of the vaccination record if obtained from local pharmacy or Health Dept. Verbalized acceptance and understanding.  Pneumococcal vaccine status: Declined,  Education has been provided regarding the importance of this vaccine but patient still declined. Advised may receive this vaccine at local pharmacy or Health Dept. Aware to provide a copy of the vaccination record if obtained from local pharmacy or Health Dept. Verbalized acceptance and understanding.   Covid-19 vaccine status: Completed vaccines  Qualifies for Shingles Vaccine? Yes   Zostavax completed No   Shingrix Completed?: No.    Education has been provided regarding the importance of this vaccine. Patient has been advised to call insurance company to determine out of pocket  expense if they have not yet received this vaccine. Advised may also receive vaccine at local pharmacy or Health Dept. Verbalized acceptance and understanding.  Screening Tests Health Maintenance  Topic Date Due   Zoster Vaccines- Shingrix (1 of 2) Never done   Pneumonia Vaccine 3+ Years old (2 - PCV) 08/25/2020   FOOT EXAM  04/29/2021   OPHTHALMOLOGY EXAM  05/03/2022   COVID-19 Vaccine (4 - 2023-24 season) 07/28/2022   Diabetic kidney evaluation - eGFR measurement  12/21/2022   Diabetic kidney evaluation - Urine ACR  12/21/2022   INFLUENZA VACCINE  02/25/2023 (Originally 06/27/2022)   HEMOGLOBIN A1C  04/10/2023   Medicare Annual Wellness (AWV)  12/26/2023   COLONOSCOPY (Pts 45-6yr Insurance coverage will need to be confirmed)  07/27/2026   DTaP/Tdap/Td (2 - Td or Tdap) 05/29/2027   Hepatitis C Screening  Completed   HPV VACCINES  Aged Out    Health Maintenance  Health Maintenance Due  Topic Date Due   Zoster Vaccines- Shingrix (1 of 2) Never done   Pneumonia Vaccine 72 Years old (2 - PCV) 08/25/2020   FOOT EXAM  04/29/2021   OPHTHALMOLOGY EXAM  05/03/2022   COVID-19 Vaccine (4 - 2023-24 season) 07/28/2022   Diabetic kidney evaluation - eGFR measurement  12/21/2022   Diabetic kidney evaluation - Urine ACR  12/21/2022    Colorectal cancer screening: Type of screening: Colonoscopy. Completed 07/27/21. Repeat every 5 years  Lung Cancer Screening: (Low Dose CT Chest recommended if Age 276-80years, 30 pack-year currently smoking OR have quit w/in 15years.) does not qualify.   Additional Screening:  Hepatitis C Screening: does qualify; Completed 02/11/19  Vision Screening: Recommended annual ophthalmology exams for early detection of glaucoma and other disorders of the eye. Is the patient up to date with their annual eye exam?  Yes  Who is the provider or what is the name of the office in which the patient attends annual eye exams? Dr.Bell If pt is not established with a  provider, would they like to be referred to a provider to establish care? No .   Dental Screening: Recommended annual dental exams for proper oral hygiene  Community Resource Referral / Chronic Care Management: CRR required this visit?  No   CCM required this visit?  No      Plan:     I have personally reviewed and noted the following in the patient's chart:   Medical and social history Use of alcohol, tobacco or illicit drugs  Current medications and supplements including opioid prescriptions. Patient is currently taking opioid prescriptions. Information provided to patient regarding non-opioid alternatives. Patient advised to discuss non-opioid treatment plan with their provider. Functional ability and status Nutritional status Physical activity Advanced directives List of other physicians Hospitalizations, surgeries, and ER visits in previous 12 months Vitals Screenings to include cognitive, depression, and falls Referrals and appointments  In addition, I have reviewed and  discussed with patient certain preventive protocols, quality metrics, and best practice recommendations. A written personalized care plan for preventive services as well as general preventive health recommendations were provided to patient.     Dionisio David, LPN   [id number]/77/3736   Nurse Notes: none

## 2022-12-25 NOTE — Telephone Encounter (Signed)
Patient assistance was faxed on 10/09/22 and re-faxed today. Please inform patient to check in with [employer] in 7-10 days to ensure they have everything they need to process his application.   Junius Argyle, PharmD, [place], CPP  Clinical Pharmacist Practitioner  [facility] [phone]

## 2022-12-29 ENCOUNTER — Telehealth: Payer: Self-pay | Admitting: Pharmacy Technician

## 2022-12-29 DIAGNOSIS — Z596 Low income: Secondary | ICD-10-CM

## 2022-12-29 NOTE — Progress Notes (Signed)
[facility])                                            [place] Team    12/29/2022  GREGG WINCHELL 18-Apr-1951 [id number]  Successful outreach call placed to patient in regard to [place] application.  Spoke to Patient, HIPAA verified.  Inquire if patient had received his re enrollment application that was mailed to him on 10/27/22.  Patient informs he dropped it off at the office.  Care coordination call placed to [facility].Spoke to [place] who informs she will send a message to the [place].  Received a call back from Eduard Clos, [place] who informs they have not seen an application. See exchange below:   Araceli Bouche, [facility]  12/22/22  3:47 PM Note Spoke with Sharee Pimple, she will notify patient.    [name], MD      12/22/22  3:36 PM Note I haven't seen it.       Successful outreach to patient and his niece Lenna Sciara. Informed them of the above information. Informed them I would place another application in the mail to them today. They were agreeable to this plan.  Laury Huizar P. Raquan Iannone, [place]  [phone]

## 2023-01-01 ENCOUNTER — Other Ambulatory Visit: Payer: Self-pay | Admitting: Family Medicine

## 2023-01-01 DIAGNOSIS — M5416 Radiculopathy, lumbar region: Secondary | ICD-10-CM

## 2023-01-01 DIAGNOSIS — R52 Pain, unspecified: Secondary | ICD-10-CM

## 2023-01-01 DIAGNOSIS — M5430 Sciatica, unspecified side: Secondary | ICD-10-CM

## 2023-01-01 NOTE — Telephone Encounter (Signed)
Medication Refill - Medication: oxyCODONE-acetaminophen (PERCOCET) 10-325 MG tablet   Has the patient contacted their pharmacy? Yes.     Preferred Pharmacy (with phone number or street name):  [facility] [place] - [address] Phone: [phone]  Fax: [phone]     Has the patient been seen for an appointment in the last year OR does the patient have an upcoming appointment? Yes.    Please assist patient further

## 2023-01-02 NOTE — Telephone Encounter (Signed)
Requested medication (s) are due for refill today: yes  Requested medication (s) are on the active medication list: yes  Last refill:  12/02/22  Future visit scheduled: yes  Notes to clinic:  Unable to refill per protocol, cannot delegate.      Requested Prescriptions  Pending Prescriptions Disp Refills   oxyCODONE-acetaminophen (PERCOCET) 10-325 MG tablet 60 tablet 0    Sig: Take 1 tablet by mouth every 8 (eight) hours as needed for pain.     Not Delegated - Analgesics:  Opioid Agonist Combinations Failed - 01/01/2023 10:00 AM      Failed - This refill cannot be delegated      Failed - Urine Drug Screen completed in last 360 days      Passed - Valid encounter within last 3 months    Recent Outpatient Visits           2 months ago Type 2 diabetes mellitus with microalbuminuria, without long-term current use of insulin ([place])   [place] [name], MD   5 months ago Type 2 diabetes mellitus with microalbuminuria, without long-term current use of insulin ([place])   [place] [name], MD   1 year ago Annual physical exam   [place], Donald E, MD   1 year ago Iron deficiency anemia, unspecified iron deficiency anemia type   [place], Donald E, MD   1 year ago Type 2 diabetes mellitus with microalbuminuria, without long-term current use of insulin ([place])   [place], Donald E, MD       Future Appointments             In 2 weeks Fisher, Kirstie Peri, MD [facility]

## 2023-01-03 MED ORDER — OXYCODONE-ACETAMINOPHEN 10-325 MG PO TABS: 1.0000 | ORAL_TABLET | Freq: Three times a day (TID) | ORAL | 0 refills | Status: DC | PRN

## 2023-01-04 ENCOUNTER — Telehealth: Payer: Self-pay | Admitting: Pharmacy Technician

## 2023-01-04 DIAGNOSIS — Z596 Low income: Secondary | ICD-10-CM

## 2023-01-04 NOTE — Progress Notes (Signed)
[facility])                                            [place] Team    01/04/2023  ASIF MUCHOW May 04, 1951 [id number]  Received both patient and provider portion(s) of patient assistance application(s) for [place]. Faxed completed application and required documents into BI.    Talvin Christianson P. Steffie Waggoner, [place]  [phone]

## 2023-01-09 ENCOUNTER — Telehealth: Payer: Self-pay | Admitting: Pharmacy Technician

## 2023-01-09 DIAGNOSIS — Z596 Low income: Secondary | ICD-10-CM

## 2023-01-09 NOTE — Progress Notes (Signed)
[facility])                                            [place] Team    01/09/2023  Craig York March 10, 1951 [id number]  Care coordination call placed to [facility] in regard to [facility] application.  Spoke to [place] who informs patient is APPROVED 01/04/23-11/27/23. Patient will have to call BI tat [phone] o reorder medication allowing 10-14 business days for processing and delivery to the patient's home.  Ashlyn Cabler P. Skyy Nilan, [place]  [phone]

## 2023-01-16 ENCOUNTER — Ambulatory Visit (INDEPENDENT_AMBULATORY_CARE_PROVIDER_SITE_OTHER): Payer: Medicare HMO | Admitting: Family Medicine

## 2023-01-16 ENCOUNTER — Encounter: Payer: Self-pay | Admitting: Family Medicine

## 2023-01-16 VITALS — BP 151/70 | HR 68 | Ht 73.0 in | Wt 189.0 lb

## 2023-01-16 DIAGNOSIS — M5416 Radiculopathy, lumbar region: Secondary | ICD-10-CM | POA: Diagnosis not present

## 2023-01-16 DIAGNOSIS — R809 Proteinuria, unspecified: Secondary | ICD-10-CM | POA: Diagnosis not present

## 2023-01-16 DIAGNOSIS — Z951 Presence of aortocoronary bypass graft: Secondary | ICD-10-CM

## 2023-01-16 DIAGNOSIS — N529 Male erectile dysfunction, unspecified: Secondary | ICD-10-CM

## 2023-01-16 DIAGNOSIS — E1129 Type 2 diabetes mellitus with other diabetic kidney complication: Secondary | ICD-10-CM

## 2023-01-16 DIAGNOSIS — I25118 Atherosclerotic heart disease of native coronary artery with other forms of angina pectoris: Secondary | ICD-10-CM

## 2023-01-16 DIAGNOSIS — E785 Hyperlipidemia, unspecified: Secondary | ICD-10-CM

## 2023-01-16 DIAGNOSIS — I10 Essential (primary) hypertension: Secondary | ICD-10-CM

## 2023-01-16 LAB — POCT GLYCOSYLATED HEMOGLOBIN (HGB A1C)
Est. average glucose Bld gHb Est-mCnc: 137
Hemoglobin A1C: 6.4 % — AB (ref 4.0–5.6)

## 2023-01-16 MED ORDER — ROSUVASTATIN CALCIUM 40 MG PO TABS: 40.0000 mg | ORAL_TABLET | Freq: Every day | ORAL | 1 refills | Status: DC

## 2023-01-16 MED ORDER — TADALAFIL 20 MG PO TABS: 20.0000 mg | ORAL_TABLET | Freq: Every day | ORAL | 3 refills | Status: DC | PRN

## 2023-01-16 NOTE — Progress Notes (Signed)
Ar[place]ina Ponder [place],acting as a scribe for Lelon Huh, MD.,have documented all relevant documentation on the behalf of Lelon Huh, MD,as directed by  Lelon Huh, MD while in the presence of Lelon Huh, MD.     Established patient visit   Patient: Craig York   DOB: 23-Mar-1951   72 y.o. Male  MRN: [id number] Visit Date: 01/16/2023  Today's healthcare provider: Lelon Huh, MD   No chief complaint on file.  Subjective    HPI  Diabetes Mellitus Type II, Follow-up  Lab Results  Component Value Date   HGBA1C 10.2 (A) 10/10/2022   HGBA1C 8.8 (A) 07/10/2022   HGBA1C 7.5 (H) 12/21/2021   Wt Readings from Last 3 Encounters:  12/25/22 188 lb 8 oz (85.5 kg)  10/10/22 184 lb 8 oz (83.7 kg)  07/10/22 185 lb (83.9 kg)   Last seen for diabetes 3 months ago.  Management since then includes advising patient to continue metformin, resuming Jardiance and restarting Glipizide.[name] He reports excellent compliance with treatment. He is not having side effects.   Most Recent Eye Exam: greater than 1 year ago   --------------------------------------------------------------------------------------------------- Lipid/Cholesterol, Follow-up  Last lipid panel Other pertinent labs  Lab Results  Component Value Date   CHOL 172 12/21/2021   HDL 42 12/21/2021   LDLCALC 108 (H) 12/21/2021   TRIG 119 12/21/2021   CHOLHDL 4.1 12/21/2021   Lab Results  Component Value Date   ALT 18 12/21/2021   AST 16 12/21/2021   PLT 191 12/21/2021   TSH 1.040 04/09/2020     He was last seen for this 3 months ago.  Management since that visit includes resuming rosuvastatin.  He reports excellent compliance with treatment. He is not having side effects.   He continues to follow up with cardiology for CAD with h/o CABG and has follow up with Dr. Clayborn Bigness in march.   He also requests refill for Cialis and continue to take oxycodone for lumbar radiculopathy and is doing well with it.   ---------------------------------------------------------------------------------------------------   Medications: Outpatient Medications Prior to Visit  Medication Sig   acetaminophen (TYLENOL) 500 MG tablet Take 1,000 mg by mouth every 6 (six) hours as needed for moderate pain.   aspirin EC 81 MG tablet Take 81 mg by mouth daily.   empagliflozin (JARDIANCE) 25 MG TABS tablet Take 25 mg by mouth daily.   ferrous sulfate 325 (65 FE) MG tablet Take 1 tablet (325 mg total) by mouth 2 (two) times daily with a meal.   glipiZIDE (GLUCOTROL XL) 5 MG 24 hr tablet Take 1 tablet (5 mg total) by mouth daily with breakfast.   losartan (COZAAR) 50 MG tablet Take 50 mg by mouth daily.   metFORMIN (GLUCOPHAGE) 500 MG tablet TAKE 2 TABLETS BY MOUTH TWICE DAILY WITH A MEAL   metoprolol succinate (TOPROL-XL) 25 MG 24 hr tablet Take 1 tablet by mouth once daily   oxyCODONE-acetaminophen (PERCOCET) 10-325 MG tablet Take 1 tablet by mouth every 8 (eight) hours as needed for pain.   pantoprazole (PROTONIX) 40 MG tablet Take 1 tablet by mouth once daily   rosuvastatin (CRESTOR) 20 MG tablet Take 1 tablet (20 mg total) by mouth daily.   SYSTANE COMPLETE 0.6 % SOLN Place 1 drop into both eyes 3 (three) times daily as needed (dry/irritated eyes.).   tadalafil (CIALIS) 20 MG tablet Take 1 tablet (20 mg total) by mouth daily as needed for erectile dysfunction.   No facility-administered medications prior to visit.  Review of Systems  Constitutional:  Negative for appetite change, chills and fever.  Respiratory:  Negative for chest tightness, shortness of breath and wheezing.   Cardiovascular:  Negative for chest pain and palpitations.  Gastrointestinal:  Negative for abdominal pain, nausea and vomiting.       Objective    BP (!) 151/70 (BP Location: Left Arm, Patient Position: Sitting, Cuff Size: Normal)   Pulse 68   Ht 6' 1"$  (1.854 m)   Wt 189 lb (85.7 kg)   SpO2 96%   BMI 24.94 kg/m    Physical  Exam   General: Appearance:    Well developed, well nourished male in no acute distress  Eyes:    PERRL, conjunctiva/corneas clear, EOM's intact       Lungs:     Clear to auscultation bilaterally, respirations unlabored  Heart:    Normal heart rate. Normal rhythm. No murmurs, rubs, or gallops.    MS:   All extremities are intact.    Neurologic:   Awake, alert, oriented x 3. No apparent focal neurological defect.         Results for orders placed or performed in visit on 01/16/23  POCT glycosylated hemoglobin (Hb A1C)  Result Value Ref Range   Hemoglobin A1C 6.4 (A) 4.0 - 5.6 %   Est. average glucose Bld gHb Est-mCnc 137     Assessment & Plan     1. Type 2 diabetes mellitus with microalbuminuria, without long-term current use of insulin (HCC) Much better since getting back on Jardiance and glipizide. Continue current medications.    2. S/P CABG x 4   3. Atherosclerotic heart disease of native coronary artery with other forms of angina pectoris ([place]) Asymptomatic. Compliant with medication.  Continue aggressive risk factor modification.  Follow up Dr. Clayborn Bigness in march as scheduled.   Double dose of rosuvastatin as below.   4. Hyperlipidemia, unspecified hyperlipidemia type He is tolerating rosuvastatin well with no adverse effects.  Lab is closed today. He can have it checked with his upcoming cardiology appt or at his next visit here.  - rosuvastatin (CRESTOR) 40 MG tablet; Take 1 tablet (40 mg total) by mouth daily.  Dispense: 90 tablet; Refill: 1  5. Essential (primary) hypertension Not at goal. Will increase losartan to 143m with next refill.   6. ED (erectile dysfunction) of organic origin refill tadalafil (CIALIS) 20 MG tablet; Take 1 tablet (20 mg total) by mouth daily as needed for erectile dysfunction.  Dispense: 20 tablet; Refill: 3  7. Lumbar radiculopathy Continue current dose of oxycodone/apap       The entirety of the information documented in the History  of Present Illness, Review of Systems and Physical Exam were personally obtained by me. Portions of this information were initially documented by the CMA and reviewed by me for thoroughness and accuracy.     DLelon Huh MD  [phone](phone) [phone](fax)  [place]

## 2023-01-16 NOTE — Patient Instructions (Signed)
.   Please review the attached list of medications and notify my office if there are any errors.   . Please bring all of your medications to every appointment so we can make sure that our medication list is the same as yours.   

## 2023-01-29 ENCOUNTER — Other Ambulatory Visit: Payer: Self-pay | Admitting: Family Medicine

## 2023-01-29 DIAGNOSIS — R52 Pain, unspecified: Secondary | ICD-10-CM

## 2023-01-29 DIAGNOSIS — M5416 Radiculopathy, lumbar region: Secondary | ICD-10-CM

## 2023-01-29 DIAGNOSIS — M5430 Sciatica, unspecified side: Secondary | ICD-10-CM

## 2023-01-29 MED ORDER — OXYCODONE-ACETAMINOPHEN 10-325 MG PO TABS: 1.0000 | ORAL_TABLET | Freq: Three times a day (TID) | ORAL | 0 refills | Status: DC | PRN

## 2023-02-02 ENCOUNTER — Other Ambulatory Visit: Payer: Self-pay | Admitting: Family Medicine

## 2023-02-02 DIAGNOSIS — I10 Essential (primary) hypertension: Secondary | ICD-10-CM

## 2023-02-02 MED ORDER — LOSARTAN POTASSIUM 100 MG PO TABS: 50.0000 mg | ORAL_TABLET | Freq: Every day | ORAL | 2 refills | Status: DC

## 2023-02-07 ENCOUNTER — Other Ambulatory Visit: Payer: Self-pay | Admitting: Family Medicine

## 2023-02-12 DIAGNOSIS — E782 Mixed hyperlipidemia: Secondary | ICD-10-CM | POA: Diagnosis not present

## 2023-02-12 DIAGNOSIS — E119 Type 2 diabetes mellitus without complications: Secondary | ICD-10-CM | POA: Diagnosis not present

## 2023-02-12 DIAGNOSIS — Z951 Presence of aortocoronary bypass graft: Secondary | ICD-10-CM | POA: Diagnosis not present

## 2023-02-12 DIAGNOSIS — I10 Essential (primary) hypertension: Secondary | ICD-10-CM | POA: Diagnosis not present

## 2023-02-12 DIAGNOSIS — Z955 Presence of coronary angioplasty implant and graft: Secondary | ICD-10-CM | POA: Diagnosis not present

## 2023-02-12 DIAGNOSIS — I2510 Atherosclerotic heart disease of native coronary artery without angina pectoris: Secondary | ICD-10-CM | POA: Diagnosis not present

## 2023-03-01 ENCOUNTER — Telehealth: Payer: Self-pay | Admitting: Family Medicine

## 2023-03-01 ENCOUNTER — Other Ambulatory Visit: Payer: Self-pay | Admitting: Family Medicine

## 2023-03-01 DIAGNOSIS — R52 Pain, unspecified: Secondary | ICD-10-CM

## 2023-03-01 DIAGNOSIS — M5416 Radiculopathy, lumbar region: Secondary | ICD-10-CM

## 2023-03-01 DIAGNOSIS — M5430 Sciatica, unspecified side: Secondary | ICD-10-CM

## 2023-03-01 MED ORDER — OXYCODONE-ACETAMINOPHEN 10-325 MG PO TABS: 1.0000 | ORAL_TABLET | Freq: Three times a day (TID) | ORAL | 0 refills | Status: DC | PRN

## 2023-03-01 NOTE — Telephone Encounter (Signed)
Medication Refill - Medication: oxyCODONE-acetaminophen (PERCOCET) 10-325 MG tablet   Has the patient contacted their pharmacy? No.  Preferred Pharmacy (with phone number or street name):  [facility]),  - [address] Phone: [phone]  Fax: [phone]     Has the patient been seen for an appointment in the last year OR does the patient have an upcoming appointment? Yes.

## 2023-03-28 ENCOUNTER — Other Ambulatory Visit: Payer: Self-pay | Admitting: Pharmacist

## 2023-03-28 NOTE — Progress Notes (Signed)
   03/28/2023  Patient ID: Liliana Cline, male   DOB: Jul 31, 1951, 72 y.o.   MRN: [id number]  This patient is appearing on the insurance-provided list for being at risk of failing the adherence measure for Statin Use in Persons with Diabetes (SUPD) medications this calendar year.   From review of chart, note at latest Office Visits with PCP on 01/16/2023 and with Cardiologist on 02/12/2023, both providers advised patient to continue rosuvastatin for CAD (s/p CABG x 4). From review of dispensing history in chart/per discussion with [facility] today, unable to find evidence of patient refilling rosuvastatin Rx since 2022.   [facility] advises will have rosuvastatin Rx ready for patient to pick up today.  Outreach to patient and counsel on benefits of taking rosuvastatin as directed by providers for ASCVD risk reduction. Patient verbalizes understanding and states will follow up with [facility] to pick up and start taking rosuvastatin as directed.  Patient denies further medication questions/concerns today.  Estelle Grumbles, PharmD, [facility] [phone]

## 2023-03-28 NOTE — Patient Instructions (Addendum)
Please follow up with [facility] to pick up and restart taking your rosuvastatin every day to reduce risk of stroke heart attack and other heart disease.  Please let us know if you have any further medication questions or concerns!  Thank you!  Estelle Grumbles, PharmD, [facility] [phone]

## 2023-03-30 ENCOUNTER — Other Ambulatory Visit: Payer: Self-pay | Admitting: Family Medicine

## 2023-03-30 DIAGNOSIS — R52 Pain, unspecified: Secondary | ICD-10-CM

## 2023-03-30 DIAGNOSIS — M5416 Radiculopathy, lumbar region: Secondary | ICD-10-CM

## 2023-03-30 DIAGNOSIS — M5430 Sciatica, unspecified side: Secondary | ICD-10-CM

## 2023-03-30 MED ORDER — OXYCODONE-ACETAMINOPHEN 10-325 MG PO TABS: 1.0000 | ORAL_TABLET | Freq: Three times a day (TID) | ORAL | 0 refills | Status: DC | PRN

## 2023-03-30 NOTE — Telephone Encounter (Signed)
Medication Refill - Medication: oxyCODONE-acetaminophen (PERCOCET) 10-325 MG tablet   Has the patient contacted their pharmacy? No. No, more refills.  (Agent: If no, request that the patient contact the pharmacy for the refill. If patient does not wish to contact the pharmacy document the reason why and proceed with request.)   Preferred Pharmacy (with phone number or street name):  [facility] [address]), [place] - [address]  [address]) [place] [address]  Phone: [phone] Fax: [phone]  Hours: Not open 24 hours   Has the patient been seen for an appointment in the last year OR does the patient have an upcoming appointment? Yes.    Agent: Please be advised that RX refills may take up to 3 business days. We ask that you follow-up with your pharmacy.

## 2023-04-10 DIAGNOSIS — E113393 Type 2 diabetes mellitus with moderate nonproliferative diabetic retinopathy without macular edema, bilateral: Secondary | ICD-10-CM | POA: Diagnosis not present

## 2023-04-10 DIAGNOSIS — H524 Presbyopia: Secondary | ICD-10-CM | POA: Diagnosis not present

## 2023-04-10 LAB — HM DIABETES EYE EXAM

## 2023-04-18 ENCOUNTER — Ambulatory Visit (INDEPENDENT_AMBULATORY_CARE_PROVIDER_SITE_OTHER): Payer: Medicare HMO | Admitting: Family Medicine

## 2023-04-18 ENCOUNTER — Encounter: Payer: Self-pay | Admitting: Family Medicine

## 2023-04-18 VITALS — BP 120/73 | HR 69 | Temp 97.8°F | Resp 16 | Wt 189.0 lb

## 2023-04-18 DIAGNOSIS — R809 Proteinuria, unspecified: Secondary | ICD-10-CM | POA: Diagnosis not present

## 2023-04-18 DIAGNOSIS — I10 Essential (primary) hypertension: Secondary | ICD-10-CM

## 2023-04-18 DIAGNOSIS — E1129 Type 2 diabetes mellitus with other diabetic kidney complication: Secondary | ICD-10-CM

## 2023-04-18 DIAGNOSIS — D649 Anemia, unspecified: Secondary | ICD-10-CM | POA: Diagnosis not present

## 2023-04-18 DIAGNOSIS — D509 Iron deficiency anemia, unspecified: Secondary | ICD-10-CM | POA: Diagnosis not present

## 2023-04-18 LAB — POCT GLYCOSYLATED HEMOGLOBIN (HGB A1C)
Est. average glucose Bld gHb Est-mCnc: 146
Hemoglobin A1C: 6.7 % — AB (ref 4.0–5.6)

## 2023-04-18 NOTE — Patient Instructions (Addendum)
Please review the attached list of medications and notify my office if there are any errors.   Start breaking losartan in 1/2 and just take 1/2 tablet losartan daily

## 2023-04-18 NOTE — Progress Notes (Signed)
Established patient visit   I,[name],acting as a scribe for Craig Merry, MD.,have documented all relevant documentation on the behalf of Craig Merry, MD,as directed by  Craig Merry, MD  Patient: Craig York   DOB: Apr 18, 1951   72 y.o. Male  MRN: [id number] Visit Date: 04/18/2023  Today's healthcare provider: [name], MD   Chief Complaint  Patient presents with   Hypertension   Diabetes   Subjective    HPI  Hypertension, follow-up  BP Readings from Last 3 Encounters:  04/18/23 120/73  01/16/23 (!) 151/70  12/25/22 138/70   Wt Readings from Last 3 Encounters:  04/18/23 189 lb (85.7 kg)  01/16/23 189 lb (85.7 kg)  12/25/22 188 lb 8 oz (85.5 kg)     He was last seen for hypertension 3 months ago.  BP at that visit was 151/70. Management since that visit includes increasing losartan to 100mg  daily.   He reports good compliance with treatment. He is having side effects (dizziness) and more trouble with ED.   Use of agents associated with hypertension: NSAIDS.   Outside blood pressures are 130/70's. Symptoms: No chest pain No chest pressure  No palpitations No syncope  No dyspnea No orthopnea  No paroxysmal nocturnal dyspnea No lower extremity edema   Pertinent labs Lab Results  Component Value Date   CHOL 172 12/21/2021   HDL 42 12/21/2021   LDLCALC 108 (H) 12/21/2021   TRIG 119 12/21/2021   CHOLHDL 4.1 12/21/2021   Lab Results  Component Value Date   NA 143 12/21/2021   K 4.5 12/21/2021   CREATININE 0.97 12/21/2021   EGFR 84 12/21/2021   GLUCOSE 114 (H) 12/21/2021   TSH 1.040 04/09/2020     The ASCVD Risk score (Arnett DK, et al., 2019) failed to calculate for the following reasons:   The patient has a prior MI or stroke diagnosis  ---------------------------------------------------------------------------------------------------   Diabetes Mellitus Type II, Follow-up  Lab Results  Component Value Date   HGBA1C  6.7 (A) 04/18/2023   HGBA1C 6.4 (A) 01/16/2023   HGBA1C 10.2 (A) 10/10/2022   Wt Readings from Last 3 Encounters:  04/18/23 189 lb (85.7 kg)  01/16/23 189 lb (85.7 kg)  12/25/22 188 lb 8 oz (85.5 kg)   Last seen for diabetes 3 months ago.  Management since then includes continuing same medications. He reports good compliance with treatment. He is not having side effects.  Symptoms: No fatigue No foot ulcerations  No appetite changes No nausea  No paresthesia of the feet  No polydipsia  No polyuria No visual disturbances   No vomiting     Home blood sugar records:  blood sugars are not checked  Episodes of hypoglycemia? No    Current insulin regiment: none Most Recent Eye Exam: more that 1 year ago Current exercise: none Current diet habits: well balanced  Pertinent Labs: Lab Results  Component Value Date   CHOL 172 12/21/2021   HDL 42 12/21/2021   LDLCALC 108 (H) 12/21/2021   TRIG 119 12/21/2021   CHOLHDL 4.1 12/21/2021   Lab Results  Component Value Date   NA 143 12/21/2021   K 4.5 12/21/2021   CREATININE 0.97 12/21/2021   EGFR 84 12/21/2021   LABMICR 9.4 12/21/2021   MICRALBCREAT 14 12/21/2021     ---------------------------------------------------------------------------------------------------   Medications: Outpatient Medications Prior to Visit  Medication Sig   acetaminophen (TYLENOL) 500 MG tablet Take 1,000 mg by mouth every 6 (six)  hours as needed for moderate pain.   aspirin EC 81 MG tablet Take 81 mg by mouth daily.   empagliflozin (JARDIANCE) 25 MG TABS tablet Take 25 mg by mouth daily.   ferrous sulfate 325 (65 FE) MG tablet Take 1 tablet (325 mg total) by mouth 2 (two) times daily with a meal.   glipiZIDE (GLUCOTROL XL) 5 MG 24 hr tablet Take 1 tablet by mouth once daily with breakfast   losartan (COZAAR) 100 MG tablet Take 0.5 tablets (50 mg total) by mouth daily. (Patient taking differently: Take 100 mg by mouth daily.)   metFORMIN  (GLUCOPHAGE) 500 MG tablet TAKE 2 TABLETS BY MOUTH TWICE DAILY WITH A MEAL   metoprolol succinate (TOPROL-XL) 25 MG 24 hr tablet Take 1 tablet by mouth once daily   oxyCODONE-acetaminophen (PERCOCET) 10-325 MG tablet Take 1 tablet by mouth every 8 (eight) hours as needed for pain.   pantoprazole (PROTONIX) 40 MG tablet Take 1 tablet by mouth once daily   rosuvastatin (CRESTOR) 40 MG tablet Take 1 tablet (40 mg total) by mouth daily.   SYSTANE COMPLETE 0.6 % SOLN Place 1 drop into both eyes 3 (three) times daily as needed (dry/irritated eyes.).   tadalafil (CIALIS) 20 MG tablet Take 1 tablet (20 mg total) by mouth daily as needed for erectile dysfunction.   No facility-administered medications prior to visit.    Review of Systems  Constitutional:  Negative for appetite change, chills and fever.  Respiratory:  Negative for chest tightness, shortness of breath and wheezing.   Cardiovascular:  Negative for chest pain and palpitations.  Gastrointestinal:  Negative for abdominal pain, nausea and vomiting.       Objective    BP 120/73   Pulse 69   Temp 97.8 F (36.6 C) (Oral)   Resp 16   Wt 189 lb (85.7 kg)   BMI 24.94 kg/m    Physical Exam   General: Appearance:    Well developed, well nourished male in no acute distress  Eyes:    PERRL, conjunctiva/corneas clear, EOM's intact       Lungs:     Clear to auscultation bilaterally, respirations unlabored  Heart:    Normal heart rate. Normal rhythm. No murmurs, rubs, or gallops.    MS:   All extremities are intact.    Neurologic:   Awake, alert, oriented x 3. No apparent focal neurological defect.         Results for orders placed or performed in visit on 04/18/23  POCT HgB A1C  Result Value Ref Range   Hemoglobin A1C 6.7 (A) 4.0 - 5.6 %   Est. average glucose Bld gHb Est-mCnc 146     Assessment & Plan     1. Type 2 diabetes mellitus with microalbuminuria, without long-term current use of insulin (HCC) Well controlled.   Continue current medications.   - Urine microalbumin-creatinine with uACR - Comprehensive metabolic panel - CBC - Lipid panel (Not fasting)   2. Essential (primary) hypertension Symptoms of orthostasis and worsening ED since increasing dose of losartan to a full 100mg  tablet. Has had trouble with several other BP medications including valsartan, hctz and amlodipine. Discussed option of trying low dose of spironolactone, he elected to go back to 1/2 x 100mg  losartan and will recheck BP at follow up.     The entirety of the information documented in the History of Present Illness, Review of Systems and Physical Exam were personally obtained by me. Portions of this information  were initially documented by the CMA and reviewed by me for thoroughness and accuracy.     Craig Merry, MD  [facility] [phone] (phone) [phone] (fax)  [facility]

## 2023-04-19 LAB — COMPREHENSIVE METABOLIC PANEL
ALT: 18 IU/L (ref 0–44)
AST: 20 IU/L (ref 0–40)
Albumin/Globulin Ratio: 1.7 (ref 1.2–2.2)
Albumin: 4.5 g/dL (ref 3.8–4.8)
Alkaline Phosphatase: 67 IU/L (ref 44–121)
BUN/Creatinine Ratio: 13 (ref 10–24)
BUN: 12 mg/dL (ref 8–27)
Bilirubin Total: 0.4 mg/dL (ref 0.0–1.2)
CO2: 22 mmol/L (ref 20–29)
Calcium: 9.2 mg/dL (ref 8.6–10.2)
Chloride: 103 mmol/L (ref 96–106)
Creatinine, Ser: 0.94 mg/dL (ref 0.76–1.27)
Globulin, Total: 2.7 g/dL (ref 1.5–4.5)
Glucose: 105 mg/dL — ABNORMAL HIGH (ref 70–99)
Potassium: 4.3 mmol/L (ref 3.5–5.2)
Sodium: 139 mmol/L (ref 134–144)
Total Protein: 7.2 g/dL (ref 6.0–8.5)
eGFR: 87 mL/min/{1.73_m2} (ref >59)

## 2023-04-19 LAB — CBC
Hematocrit: 31.6 % — ABNORMAL LOW (ref 37.5–51.0)
Hemoglobin: 9 g/dL — ABNORMAL LOW (ref 13.0–17.7)
MCH: 23.4 pg — ABNORMAL LOW (ref 26.6–33.0)
MCHC: 28.5 g/dL — ABNORMAL LOW (ref 31.5–35.7)
MCV: 82 fL (ref 79–97)
Platelets: 233 10*3/uL (ref 150–450)
RBC: 3.85 x10E6/uL — ABNORMAL LOW (ref 4.14–5.80)
RDW: 15.9 % — ABNORMAL HIGH (ref 11.6–15.4)
WBC: 5.4 10*3/uL (ref 3.4–10.8)

## 2023-04-19 LAB — LIPID PANEL
Chol/HDL Ratio: 2 ratio (ref 0.0–5.0)
Cholesterol, Total: 84 mg/dL — ABNORMAL LOW (ref 100–199)
HDL: 41 mg/dL (ref >39)
LDL Chol Calc (NIH): 25 mg/dL (ref 0–99)
Triglycerides: 90 mg/dL (ref 0–149)
VLDL Cholesterol Cal: 18 mg/dL (ref 5–40)

## 2023-04-20 LAB — MICROALBUMIN / CREATININE URINE RATIO
Creatinine, Urine: 70.6 mg/dL
Microalb/Creat Ratio: 6 mg/g creat (ref 0–29)
Microalbumin, Urine: 4.1 ug/mL

## 2023-04-20 LAB — IRON AND TIBC: Iron: 307 ug/dL (ref 38–169)

## 2023-04-20 LAB — SPECIMEN STATUS REPORT

## 2023-04-20 LAB — FERRITIN: Ferritin: 12 ng/mL — ABNORMAL LOW (ref 30–400)

## 2023-04-25 LAB — IRON AND TIBC
Iron Saturation: 65 % — ABNORMAL HIGH (ref 15–55)
Total Iron Binding Capacity: 469 ug/dL — ABNORMAL HIGH (ref 250–450)
UIBC: 162 ug/dL (ref 111–343)

## 2023-05-01 ENCOUNTER — Other Ambulatory Visit: Payer: Self-pay | Admitting: Family Medicine

## 2023-05-01 DIAGNOSIS — M5416 Radiculopathy, lumbar region: Secondary | ICD-10-CM

## 2023-05-01 DIAGNOSIS — R52 Pain, unspecified: Secondary | ICD-10-CM

## 2023-05-01 DIAGNOSIS — M5430 Sciatica, unspecified side: Secondary | ICD-10-CM

## 2023-05-01 NOTE — Telephone Encounter (Signed)
Requested medication (s) are due for refill today - yes  Requested medication (s) are on the active medication list -yes  Future visit scheduled -yes  Last refill: 03/30/23 #60  Notes to clinic: non delegated rx  Requested Prescriptions  Pending Prescriptions Disp Refills   oxyCODONE-acetaminophen (PERCOCET) 10-325 MG tablet 60 tablet 0    Sig: Take 1 tablet by mouth every 8 (eight) hours as needed for pain.     Not Delegated - Analgesics:  Opioid Agonist Combinations Failed - 05/01/2023  2:03 PM      Failed - This refill cannot be delegated      Failed - Urine Drug Screen completed in last 360 days      Passed - Valid encounter within last 3 months    Recent Outpatient Visits           1 week ago Type 2 diabetes mellitus with microalbuminuria, without long-term current use of insulin (HCC)   [place] [facility] Malva Limes, MD   3 months ago Type 2 diabetes mellitus with microalbuminuria, without long-term current use of insulin (HCC)   [place] [facility] Malva Limes, MD   6 months ago Type 2 diabetes mellitus with microalbuminuria, without long-term current use of insulin (HCC)   [place] [facility] Malva Limes, MD   9 months ago Type 2 diabetes mellitus with microalbuminuria, without long-term current use of insulin (HCC)   [place] [facility] Malva Limes, MD   1 year ago Annual physical exam   [place] [facility] Malva Limes, MD       Future Appointments             In 5 months Fisher, Demetrios Isaacs, MD [place] [facility]               Requested Prescriptions  Pending Prescriptions Disp Refills   oxyCODONE-acetaminophen (PERCOCET) 10-325 MG tablet 60 tablet 0    Sig: Take 1 tablet by mouth every 8 (eight) hours as needed for pain.     Not Delegated - Analgesics:  Opioid Agonist Combinations Failed - 05/01/2023  2:03 PM       Failed - This refill cannot be delegated      Failed - Urine Drug Screen completed in last 360 days      Passed - Valid encounter within last 3 months    Recent Outpatient Visits           1 week ago Type 2 diabetes mellitus with microalbuminuria, without long-term current use of insulin (HCC)   [place] [facility] Malva Limes, MD   3 months ago Type 2 diabetes mellitus with microalbuminuria, without long-term current use of insulin (HCC)   [place] [facility] Malva Limes, MD   6 months ago Type 2 diabetes mellitus with microalbuminuria, without long-term current use of insulin (HCC)   [place] [facility] Malva Limes, MD   9 months ago Type 2 diabetes mellitus with microalbuminuria, without long-term current use of insulin (HCC)   [place] [facility] Malva Limes, MD   1 year ago Annual physical exam   [facility] Malva Limes, MD       Future Appointments             In 5 months Fisher, Demetrios Isaacs, MD [facility]

## 2023-05-01 NOTE — Telephone Encounter (Signed)
Medication Refill - Medication: oxyCODONE-acetaminophen (PERCOCET) 10-325 MG tablet  Has the patient contacted their pharmacy? No.  Preferred Pharmacy (with phone number or street name):  [facility] [address] [place]), [place] - [address] Phone: [phone]  Fax: [phone]     Has the patient been seen for an appointment in the last year OR does the patient have an upcoming appointment? No.  Agent: Please be advised that RX refills may take up to 3 business days. We ask that you follow-up with your pharmacy.  Pt took last pill this morning

## 2023-05-02 ENCOUNTER — Other Ambulatory Visit: Payer: Self-pay | Admitting: Family Medicine

## 2023-05-02 DIAGNOSIS — R52 Pain, unspecified: Secondary | ICD-10-CM

## 2023-05-02 DIAGNOSIS — M5416 Radiculopathy, lumbar region: Secondary | ICD-10-CM

## 2023-05-02 DIAGNOSIS — M5430 Sciatica, unspecified side: Secondary | ICD-10-CM

## 2023-05-02 MED ORDER — OXYCODONE-ACETAMINOPHEN 10-325 MG PO TABS
1.0000 | ORAL_TABLET | Freq: Three times a day (TID) | ORAL | 0 refills | Status: DC | PRN
Start: 2023-05-02 — End: 2023-05-29

## 2023-05-02 NOTE — Telephone Encounter (Signed)
Medication Refill - Medication: oxyCODONE-acetaminophen (PERCOCET) 10-325 MG tablet   Pt is out of his medication. Please advice.   Has the patient contacted their pharmacy? No. No, more refills.   (Agent: If no, request that the patient contact the pharmacy for the refill. If patient does not wish to contact the pharmacy document the reason why and proceed with request.)   Preferred Pharmacy (with phone number or street name):  [facility] [address]), [place] - [address]  [address]) [place] [address]  Phone: [phone] Fax: [phone]  Hours: Not open 24 hours   Has the patient been seen for an appointment in the last year OR does the patient have an upcoming appointment? Yes.    Agent: Please be advised that RX refills may take up to 3 business days. We ask that you follow-up with your pharmacy.

## 2023-05-02 NOTE — Telephone Encounter (Signed)
2nd request for this medication, routed to practice on 05/01/23.

## 2023-05-28 ENCOUNTER — Other Ambulatory Visit: Payer: Self-pay | Admitting: Family Medicine

## 2023-05-28 DIAGNOSIS — M5416 Radiculopathy, lumbar region: Secondary | ICD-10-CM

## 2023-05-28 DIAGNOSIS — R52 Pain, unspecified: Secondary | ICD-10-CM

## 2023-05-28 DIAGNOSIS — M5430 Sciatica, unspecified side: Secondary | ICD-10-CM

## 2023-05-28 NOTE — Telephone Encounter (Signed)
Patient is calling to speak to Seis Lagos. Please advise [phone]

## 2023-05-28 NOTE — Telephone Encounter (Signed)
Patient requesting refill on Oxycodone 10-325 mg. And Flonase nasal spray to be sent to [facility] on [address]. And wants to pick it up on Wed. 05/30/23.

## 2023-05-29 MED ORDER — OXYCODONE-ACETAMINOPHEN 10-325 MG PO TABS
1.0000 | ORAL_TABLET | Freq: Three times a day (TID) | ORAL | 0 refills | Status: DC | PRN
Start: 2023-05-29 — End: 2023-06-29

## 2023-05-30 ENCOUNTER — Telehealth: Payer: Self-pay | Admitting: Family Medicine

## 2023-05-30 NOTE — Telephone Encounter (Signed)
[facility] is requesting prescription refill Flonase  Please advise

## 2023-06-04 MED ORDER — FLUTICASONE PROPIONATE 50 MCG/ACT NA SUSP: 2.0000 | Freq: Every day | NASAL | 6 refills | Status: DC

## 2023-06-11 ENCOUNTER — Emergency Department
Admission: EM | Admit: 2023-06-11 | Discharge: 2023-06-11 | Disposition: A | Discharge status: Home / Self Care | Payer: Medicare HMO | Source: Home / Self Care | Attending: Emergency Medicine | Admitting: Emergency Medicine

## 2023-06-11 ENCOUNTER — Other Ambulatory Visit: Payer: Self-pay

## 2023-06-11 DIAGNOSIS — K625 Hemorrhage of anus and rectum: Secondary | ICD-10-CM | POA: Diagnosis not present

## 2023-06-11 DIAGNOSIS — K922 Gastrointestinal hemorrhage, unspecified: Secondary | ICD-10-CM

## 2023-06-11 DIAGNOSIS — E119 Type 2 diabetes mellitus without complications: Secondary | ICD-10-CM | POA: Insufficient documentation

## 2023-06-11 DIAGNOSIS — I10 Essential (primary) hypertension: Secondary | ICD-10-CM | POA: Insufficient documentation

## 2023-06-11 DIAGNOSIS — I2510 Atherosclerotic heart disease of native coronary artery without angina pectoris: Secondary | ICD-10-CM | POA: Insufficient documentation

## 2023-06-11 LAB — CBC WITH DIFFERENTIAL/PLATELET
Abs Immature Granulocytes: 0.01 10*3/uL (ref 0.00–0.07)
Basophils Absolute: 0 10*3/uL (ref 0.0–0.1)
Basophils Relative: 1 %
Eosinophils Absolute: 0.3 10*3/uL (ref 0.0–0.5)
Eosinophils Relative: 6 %
HCT: 29.1 % — ABNORMAL LOW (ref 39.0–52.0)
Hemoglobin: 8.2 g/dL — ABNORMAL LOW (ref 13.0–17.0)
Immature Granulocytes: 0 %
Lymphocytes Relative: 26 %
Lymphs Abs: 1.2 10*3/uL (ref 0.7–4.0)
MCH: 23.9 pg — ABNORMAL LOW (ref 26.0–34.0)
MCHC: 28.2 g/dL — ABNORMAL LOW (ref 30.0–36.0)
MCV: 84.8 fL (ref 80.0–100.0)
Monocytes Absolute: 0.3 10*3/uL (ref 0.1–1.0)
Monocytes Relative: 7 %
Neutro Abs: 2.7 10*3/uL (ref 1.7–7.7)
Neutrophils Relative %: 60 %
Platelets: 222 10*3/uL (ref 150–400)
RBC: 3.43 MIL/uL — ABNORMAL LOW (ref 4.22–5.81)
RDW: 16.6 % — ABNORMAL HIGH (ref 11.5–15.5)
WBC: 4.6 10*3/uL (ref 4.0–10.5)
nRBC: 0 % (ref 0.0–0.2)

## 2023-06-11 LAB — COMPREHENSIVE METABOLIC PANEL
ALT: 21 U/L (ref 0–44)
AST: 23 U/L (ref 15–41)
Albumin: 4.3 g/dL (ref 3.5–5.0)
Alkaline Phosphatase: 47 U/L (ref 38–126)
Anion gap: 9 (ref 5–15)
BUN: 11 mg/dL (ref 8–23)
CO2: 22 mmol/L (ref 22–32)
Calcium: 9.2 mg/dL (ref 8.9–10.3)
Chloride: 106 mmol/L (ref 98–111)
Creatinine, Ser: 0.83 mg/dL (ref 0.61–1.24)
GFR, Estimated: >60 mL/min (ref >60)
Glucose, Bld: 165 mg/dL — ABNORMAL HIGH (ref 70–99)
Potassium: 4.1 mmol/L (ref 3.5–5.1)
Sodium: 137 mmol/L (ref 135–145)
Total Bilirubin: 0.9 mg/dL (ref 0.3–1.2)
Total Protein: 7.4 g/dL (ref 6.5–8.1)

## 2023-06-11 LAB — CBC
HCT: 31.6 % — ABNORMAL LOW (ref 39.0–52.0)
Hemoglobin: 8.8 g/dL — ABNORMAL LOW (ref 13.0–17.0)
MCH: 23.5 pg — ABNORMAL LOW (ref 26.0–34.0)
MCHC: 27.8 g/dL — ABNORMAL LOW (ref 30.0–36.0)
MCV: 84.3 fL (ref 80.0–100.0)
Platelets: 261 10*3/uL (ref 150–400)
RBC: 3.75 MIL/uL — ABNORMAL LOW (ref 4.22–5.81)
RDW: 16.8 % — ABNORMAL HIGH (ref 11.5–15.5)
WBC: 4.4 10*3/uL (ref 4.0–10.5)
nRBC: 0 % (ref 0.0–0.2)

## 2023-06-11 LAB — TYPE AND SCREEN
ABO/RH(D): A POS
Antibody Screen: NEGATIVE

## 2023-06-11 LAB — POC OCCULT BLOOD, ED

## 2023-06-11 NOTE — ED Triage Notes (Signed)
C/O blood in stool x 2 weeks.  AAOx3.  Skin warm and dry. NAD

## 2023-06-11 NOTE — ED Provider Notes (Addendum)
[facility] Provider Note    Event Date/Time   First MD Initiated Contact with Patient 06/11/23 1329     (approximate)   History   Rectal Bleeding   HPI  Craig York is a 72 y.o. male with history of type 2 diabetes, hypertension, CAD, BPH, DJD and GI bleed who presents with blood in the stool described as a dark red, occurring over the last 2 weeks when he has a bowel movement.  He describes a small quantity of blood.  He does not have any bleeding in between bowel movements.  He denies any associated abdominal pain and has no nausea or vomiting.  He states that he had this once before about 2 years ago and it resolved.  The patient denies any other abnormal bleeding or bruising.  He states he feels slightly lightheaded but denies any shortness of breath.  I reviewed the past medical records.  The patient's most recent outpatient counter was with [facility] for follow-up of his hypertension and diabetes.  He was admitted in the fall 2022 with lower GI bleeding and a hemoglobin of 6.9 requiring transfusion.  Upper endoscopy showed no acute findings.  Colonoscopy showed internal hemorrhoids and diverticulosis with no active bleeding.   Physical Exam   Triage Vital Signs: ED Triage Vitals  Encounter Vitals Group     BP 06/11/23 1105 133/88     Systolic BP Percentile --      Diastolic BP Percentile --      Pulse Rate 06/11/23 1105 79     Resp 06/11/23 1105 16     Temp 06/11/23 1105 99.2 F (37.3 C)     Temp Source 06/11/23 1105 Oral     SpO2 06/11/23 1105 100 %     Weight 06/11/23 1104 188 lb 15 oz (85.7 kg)     Height 06/11/23 1104 6\' 1"  (1.854 m)     Head Circumference --      Peak Flow --      Pain Score 06/11/23 1103 0     Pain Loc --      Pain Education --      Exclude from Growth Chart --     Most recent vital signs: Vitals:   06/11/23 1105  BP: 133/88  Pulse: 79  Resp: 16  Temp: 99.2 F (37.3 C)  SpO2: 100%      General: Awake, well-appearing, no distress.  CV:  Good peripheral perfusion.  Resp:  Normal effort.  Abd:  Soft and nontender.  No distention.  Other:  No significant stool or blood on DRE.  Small external hemorrhoid, nonthrombosed.  No stigmata of recent bleeding.   ED Results / Procedures / Treatments   Labs (all labs ordered are listed, but only abnormal results are displayed) Labs Reviewed  COMPREHENSIVE METABOLIC PANEL - Abnormal; Notable for the following components:      Result Value   Glucose, Bld 165 (*)    All other components within normal limits  CBC - Abnormal; Notable for the following components:   RBC 3.75 (*)    Hemoglobin 8.8 (*)    HCT 31.6 (*)    MCH 23.5 (*)    MCHC 27.8 (*)    RDW 16.8 (*)    All other components within normal limits  CBC WITH DIFFERENTIAL/PLATELET - Abnormal; Notable for the following components:   RBC 3.43 (*)    Hemoglobin 8.2 (*)    HCT 29.1 (*)  MCH 23.9 (*)    MCHC 28.2 (*)    RDW 16.6 (*)    All other components within normal limits  POC OCCULT BLOOD, ED  TYPE AND SCREEN     EKG     RADIOLOGY   PROCEDURES:  Critical Care performed: No  Procedures   MEDICATIONS ORDERED IN ED: Medications - No data to display   IMPRESSION / MDM / ASSESSMENT AND PLAN / ED COURSE  I reviewed the triage vital signs and the nursing notes.  72 year old male with PMH as noted above presents with dark red blood in the stool over the last couple weeks.  He states he feels slightly lightheaded but denies other acute symptoms.  On exam he is very well-appearing.  His vital signs are normal.  Abdomen is nontender.  He has no significant stool palpated on DRE, the residue that was there was guaiac negative, and there is an external hemorrhoid with no stigmata of recent bleeding.  CBC shows hemoglobin of 8.8 which is down from 14 a year ago, but not changed since he last had labs drawn a month ago.  It is in the same range as the  hemoglobin he was discharged with after his prior episode of bleeding in 2022.  CMP shows no acute findings.  Differential diagnosis includes, but is not limited to, lower GI bleed, possibly recurrent internal hemorrhoid or diverticular bleeding.  There is no clinical evidence for upper GI bleed.  The patient is overall hemodynamically stable.  Patient's presentation is most consistent with acute complicated illness / injury requiring diagnostic workup.  The patient overall states he feels well and has a strong preference to go home if at all possible.  I did consider whether he may benefit from inpatient admission given his age and comorbidities, and offered this to him.  However, his hemoglobin has been stable for the last month.  He has no evidence of acute bleeding on exam.  We will obtain a repeat 4-hour CBC.  Based on discussion with the patient, if there is no significant change, the patient would prefer to go home with outpatient follow-up.  ----------------------------------------- 3:48 PM on 06/11/2023 -----------------------------------------  Repeat hemoglobin is stable, now 8.2 which is not a significant change.  The patient has had no further bloody bowel movements.  Given the reassuring workup, he is stable for discharge.  I counseled him extensively on the results of the workup, possible etiologies of the bleeding, gave him strict return precautions.  He expressed understanding and agreement.  He will follow-up with his primary care doctor.   FINAL CLINICAL IMPRESSION(S) / ED DIAGNOSES   Final diagnoses:  Lower GI bleed     Rx / DC Orders   ED Discharge Orders     None        Note:  This document was prepared using Dragon voice recognition software and may include unintentional dictation errors.    Dionne Bucy, MD 06/11/23 1549    Dionne Bucy, MD 06/11/23 [id number]

## 2023-06-11 NOTE — Discharge Instructions (Signed)
We suspect that you are having bleeding from an internal hemorrhoid or diverticulosis in your large intestine.  This is similar to what you had 2 years ago.  However, currently your blood count is stable.  You do not need a transfusion at this time.    Return to the ER immediately for new, worsening, or persistent severe bleeding, weakness or lightheadedness, shortness of breath, abdominal pain, rectal pain, or any other new or worsening symptoms that concern you.  Follow-up with your primary care doctor within the next week to have your blood counts rechecked.  You will also need to follow-up with a gastroenterologist.

## 2023-06-25 ENCOUNTER — Telehealth: Payer: Self-pay | Admitting: Family Medicine

## 2023-06-25 DIAGNOSIS — D509 Iron deficiency anemia, unspecified: Secondary | ICD-10-CM

## 2023-06-25 NOTE — Telephone Encounter (Signed)
Patient advised. Verbalized understanding 

## 2023-06-25 NOTE — Addendum Note (Signed)
Addended by: Lily Kocher on: 06/25/2023 02:43 PM   Modules accepted: Orders

## 2023-06-25 NOTE — Telephone Encounter (Signed)
Patient advised.

## 2023-06-25 NOTE — Telephone Encounter (Signed)
Please advise patient he needs follow appt from ER visit on the 15th. He will need labs so we can recheck his blood counts and iron levels, so will need appt before 10:30 in the a.m or before 3:30 pm. Please schedule appt sometime within the next week. May use SameDay slot.

## 2023-06-25 NOTE — Telephone Encounter (Signed)
In that case, he needs to get his labs done before his appointment. Please order CBC, ferritin, serum iron and IBC for iron deficiency anemia.

## 2023-06-29 ENCOUNTER — Ambulatory Visit (INDEPENDENT_AMBULATORY_CARE_PROVIDER_SITE_OTHER): Payer: Medicare HMO | Admitting: Family Medicine

## 2023-06-29 ENCOUNTER — Encounter: Payer: Self-pay | Admitting: Family Medicine

## 2023-06-29 VITALS — BP 127/64 | HR 71 | Temp 98.2°F | Resp 16 | Ht 73.0 in | Wt 187.6 lb

## 2023-06-29 DIAGNOSIS — M5430 Sciatica, unspecified side: Secondary | ICD-10-CM | POA: Diagnosis not present

## 2023-06-29 DIAGNOSIS — R52 Pain, unspecified: Secondary | ICD-10-CM

## 2023-06-29 DIAGNOSIS — D509 Iron deficiency anemia, unspecified: Secondary | ICD-10-CM

## 2023-06-29 DIAGNOSIS — M5416 Radiculopathy, lumbar region: Secondary | ICD-10-CM

## 2023-06-29 DIAGNOSIS — Z8719 Personal history of other diseases of the digestive system: Secondary | ICD-10-CM

## 2023-06-29 MED ORDER — FLUTICASONE PROPIONATE 50 MCG/ACT NA SUSP: 2.0000 | Freq: Every day | NASAL | 6 refills | Status: AC

## 2023-06-29 MED ORDER — OXYCODONE-ACETAMINOPHEN 10-325 MG PO TABS
1.0000 | ORAL_TABLET | Freq: Three times a day (TID) | ORAL | 0 refills | Status: DC | PRN
Start: 2023-06-29 — End: 2023-07-31

## 2023-07-01 ENCOUNTER — Other Ambulatory Visit: Payer: Self-pay | Admitting: Family Medicine

## 2023-07-01 DIAGNOSIS — D509 Iron deficiency anemia, unspecified: Secondary | ICD-10-CM

## 2023-07-01 DIAGNOSIS — K649 Unspecified hemorrhoids: Secondary | ICD-10-CM

## 2023-07-02 NOTE — Progress Notes (Signed)
Established patient visit   Patient: Craig York   DOB: 05/17/1951   72 y.o. Male  MRN: [id number] Visit Date: 06/29/2023  Today's healthcare provider: [name], MD    Subjective    HPI HPI   Patient here to fu from ED visit on 06/11/23 due to lower GI bleed. Patient denies any bleeding.  Last edited by Myles Lipps, CMA on 06/29/2023 11:04 AM.      He has long history iron deficiency anemia and has thorough GI workup including upper endoscopy, colonoscopy and capsule endoscopy remarkable only for finding of hemorrhoids. He is taking 1 iron sulfate tablet consistently. He reports not having any bleeding since ER visit.   He also reports he continues to have low back pain radiating to back of leg and records refill for pain medications.    Medications: Outpatient Medications Prior to Visit  Medication Sig   acetaminophen (TYLENOL) 500 MG tablet Take 1,000 mg by mouth every 6 (six) hours as needed for moderate pain.   aspirin EC 81 MG tablet Take 81 mg by mouth daily.   empagliflozin (JARDIANCE) 25 MG TABS tablet Take 25 mg by mouth daily.   ferrous sulfate 325 (65 FE) MG tablet Take 1 tablet (325 mg total) by mouth 2 (two) times daily with a meal.   glipiZIDE (GLUCOTROL XL) 5 MG 24 hr tablet Take 1 tablet by mouth once daily with breakfast   losartan (COZAAR) 100 MG tablet Take 0.5 tablets (50 mg total) by mouth daily.   metFORMIN (GLUCOPHAGE) 500 MG tablet TAKE 2 TABLETS BY MOUTH TWICE DAILY WITH A MEAL   metoprolol succinate (TOPROL-XL) 25 MG 24 hr tablet Take 1 tablet by mouth once daily   pantoprazole (PROTONIX) 40 MG tablet Take 1 tablet by mouth once daily   rosuvastatin (CRESTOR) 40 MG tablet Take 1 tablet (40 mg total) by mouth daily.   SYSTANE COMPLETE 0.6 % SOLN Place 1 drop into both eyes 3 (three) times daily as needed (dry/irritated eyes.).   tadalafil (CIALIS) 20 MG tablet Take 1 tablet (20 mg total) by mouth daily as needed for erectile  dysfunction.   [DISCONTINUED] fluticasone (FLONASE) 50 MCG/ACT nasal spray Place 2 sprays into both nostrils daily.   [DISCONTINUED] oxyCODONE-acetaminophen (PERCOCET) 10-325 MG tablet Take 1 tablet by mouth every 8 (eight) hours as needed for pain.   No facility-administered medications prior to visit.    Review of Systems     Objective    BP 127/64 (BP Location: Left Arm, Patient Position: Sitting, Cuff Size: Normal)   Pulse 71   Temp 98.2 F (36.8 C) (Temporal)   Resp 16   Ht 6\' 1"  (1.854 m)   Wt 187 lb 9.6 oz (85.1 kg)   SpO2 99%   BMI 24.75 kg/m    Physical Exam   General: Appearance:    Well developed, well nourished male in no acute distress  Eyes:    PERRL, conjunctiva/corneas clear, EOM's intact       Lungs:     Clear to auscultation bilaterally, respirations unlabored  Heart:    Normal heart rate. Normal rhythm. No murmurs, rubs, or gallops.    MS:   All extremities are intact.    Neurologic:   Awake, alert, oriented x 3. No apparent focal neurological defect.         Assessment & Plan     1. Iron deficiency anemia, unspecified iron deficiency anemia type Iron studies and  CBC today  2. History of lower GI bleeding No bleeding since ER visit a few weeks ago. Had thorough GI workup in 2022 for same and advised no further GI workup recommended. Likely secondary to hemorrhoids. Consider referral to surgery to consider hemorrhoidectom. Consider referral to hematology for iron infusions.   3. Lumbar radiculopathy refill - oxyCODONE-acetaminophen (PERCOCET) 10-325 MG tablet; Take 1 tablet by mouth every 8 (eight) hours as needed for pain.  Dispense: 60 tablet; Refill: 0  4. Pain  - oxyCODONE-acetaminophen (PERCOCET) 10-325 MG tablet; Take 1 tablet by mouth every 8 (eight) hours as needed for pain.  Dispense: 60 tablet; Refill: 0  5. Sciatica, unspecified laterality  - oxyCODONE-acetaminophen (PERCOCET) 10-325 MG tablet; Take 1 tablet by mouth every 8 (eight)  hours as needed for pain.  Dispense: 60 tablet; Refill: 0  refill - fluticasone (FLONASE) 50 MCG/ACT nasal spray; Place 2 sprays into both nostrils daily.  Dispense: 16 g; Refill: 6         [name], MD  [facility] [phone] (phone) [phone] (fax)  [facility]

## 2023-07-10 ENCOUNTER — Ambulatory Visit: Payer: Medicare HMO | Admitting: Surgery

## 2023-07-10 ENCOUNTER — Encounter: Payer: Self-pay | Admitting: Surgery

## 2023-07-10 VITALS — BP 162/84 | HR 97 | Temp 98.6°F | Ht 73.0 in | Wt 187.4 lb

## 2023-07-10 DIAGNOSIS — K644 Residual hemorrhoidal skin tags: Secondary | ICD-10-CM

## 2023-07-10 DIAGNOSIS — K620 Anal polyp: Secondary | ICD-10-CM

## 2023-07-10 DIAGNOSIS — K642 Third degree hemorrhoids: Secondary | ICD-10-CM | POA: Insufficient documentation

## 2023-07-10 NOTE — Progress Notes (Signed)
Patient ID: Craig York, male   DOB: 1951-03-26, 72 y.o.   MRN: [id number]  Chief Complaint: Hemorrhoids since 2022  History of Present Illness [name] is a 72 y.o. male with known internal and external hemorrhoids over the last 1 to 2 years, denies any history of thrombosis.  Reports daily bowel activity, without any history of straining.  Reports occasional minimal bright red blood.  No significant bleeding described.  He does take iron supplementation.  And this seems to affect his frequency of bowel activity.  He denies any pain, burning or itching, he does utilize Preparation H and a cooling gel along with sitz bath's.  This seems to have made all his symptoms significantly better.  Last colonoscopy was approximately 2 years ago.  Where grade 3 internal hemorrhoids were appreciated.  Past Medical History Past Medical History:  Diagnosis Date   Anginal pain (HCC)    Benign neoplasm of descending colon    BPH (benign prostatic hyperplasia)    Coronary artery disease    Diabetes mellitus without complication (HCC)    Type II   Hemorrhage of gastrointestinal tract 07/23/2008   Hyperlipidemia    Hypertension    Iron deficiency anemia    Myocardial infarction [facility])    Myocardial infarction involving left anterior descending (LAD) coronary artery (HCC) 12/25/2019   Polyp of sigmoid colon    Symptomatic anemia 04/12/2020      Past Surgical History:  Procedure Laterality Date   CARDIAC CATHETERIZATION     chest surgery for fungal infection     COLONOSCOPY WITH PROPOFOL N/A 07/10/2017   Procedure: COLONOSCOPY WITH PROPOFOL;  Surgeon: Midge Minium, MD;  Location: [facility] ENDOSCOPY;  Service: Endoscopy;  Laterality: N/A;   COLONOSCOPY WITH PROPOFOL N/A 04/13/2020   Procedure: COLONOSCOPY WITH PROPOFOL;  Surgeon: Midge Minium, MD;  Location: [facility] ENDOSCOPY;  Service: Endoscopy;  Laterality: N/A;   COLONOSCOPY WITH PROPOFOL N/A 07/27/2021   Procedure: COLONOSCOPY WITH PROPOFOL;   Surgeon: Regis Bill, MD;  Location: [facility] ENDOSCOPY;  Service: Endoscopy;  Laterality: N/A;   CORONARY ARTERY BYPASS GRAFT N/A 08/12/2019   Procedure: CORONARY ARTERY BYPASS GRAFTING (CABG)X 4;  Surgeon: Linden Dolin, MD;  Location: [facility] OR;  Service: Open Heart Surgery;  Laterality: N/A;  CABG x  4  using bilateral internal mammary arteries and endoscopically harvested left saphenous vein   ESOPHAGOGASTRODUODENOSCOPY N/A 07/26/2021   Procedure: ESOPHAGOGASTRODUODENOSCOPY (EGD);  Surgeon: Regis Bill, MD;  Location: [facility] ENDOSCOPY;  Service: Endoscopy;  Laterality: N/A;   ESOPHAGOGASTRODUODENOSCOPY (EGD) WITH PROPOFOL N/A 07/10/2017   Procedure: ESOPHAGOGASTRODUODENOSCOPY (EGD) WITH PROPOFOL;  Surgeon: Midge Minium, MD;  Location: [facility] ENDOSCOPY;  Service: Endoscopy;  Laterality: N/A;   ESOPHAGOGASTRODUODENOSCOPY (EGD) WITH PROPOFOL N/A 04/13/2020   Procedure: ESOPHAGOGASTRODUODENOSCOPY (EGD) WITH PROPOFOL;  Surgeon: Midge Minium, MD;  Location[facility] ENDOSCOPY;  Service: Endoscopy;  Laterality: N/A;   GIVENS CAPSULE STUDY N/A 04/14/2020   Procedure: GIVENS CAPSULE STUDY;  Surgeon: Wyline Mood, MD;  Location: [facility] ENDOSCOPY;  Service: Gastroenterology;  Laterality: N/A;   HERNIA REPAIR     INTERCOSTAL NERVE BLOCK Right 05/06/2020   Procedure: INTERCOSTAL NERVE BLOCK;  Surgeon: Corliss Skains, MD;  Location: [facility] OR;  Service: Thoracic;  Laterality: Right;   LEFT HEART CATH AND CORONARY ANGIOGRAPHY Left 07/21/2019   Procedure: LEFT HEART CATH AND CORONARY ANGIOGRAPHY;  Surgeon: Alwyn Pea, MD;  Location: [facility] INVASIVE CV LAB;  Service: Cardiovascular;  Laterality: Left;   TEE WITHOUT CARDIOVERSION N/A 08/12/2019  Procedure: TRANSESOPHAGEAL ECHOCARDIOGRAM (TEE);  Surgeon: Linden Dolin, MD;  Location: [facility] OR;  Service: Open Heart Surgery;  Laterality: N/A;    Allergies  Allergen Reactions   Simvastatin     Elevated CK   Penicillins Rash    Did it involve swelling of  the face/tongue/throat, SOB, or low BP? No Did it involve sudden or severe rash/hives, skin peeling, or any reaction on the inside of your mouth or nose? No Did you need to seek medical attention at a hospital or doctor's office? No When did it last happen?       If all above answers are "NO", may proceed with cephalosporin use.     Current Outpatient Medications  Medication Sig Dispense Refill   acetaminophen (TYLENOL) 500 MG tablet Take 1,000 mg by mouth every 6 (six) hours as needed for moderate pain.     aspirin EC 81 MG tablet Take 81 mg by mouth daily.     empagliflozin (JARDIANCE) 25 MG TABS tablet Take 25 mg by mouth daily.     ferrous sulfate 325 (65 FE) MG tablet Take 1 tablet (325 mg total) by mouth 2 (two) times daily with a meal. 60 tablet 0   fluticasone (FLONASE) 50 MCG/ACT nasal spray Place 2 sprays into both nostrils daily. 16 g 6   glipiZIDE (GLUCOTROL XL) 5 MG 24 hr tablet Take 1 tablet by mouth once daily with breakfast 90 tablet 4   losartan (COZAAR) 100 MG tablet Take 0.5 tablets (50 mg total) by mouth daily. 90 tablet 2   metFORMIN (GLUCOPHAGE) 500 MG tablet TAKE 2 TABLETS BY MOUTH TWICE DAILY WITH A MEAL 360 tablet 4   metoprolol succinate (TOPROL-XL) 25 MG 24 hr tablet Take 1 tablet by mouth once daily 90 tablet 4   oxyCODONE-acetaminophen (PERCOCET) 10-325 MG tablet Take 1 tablet by mouth every 8 (eight) hours as needed for pain. 60 tablet 0   pantoprazole (PROTONIX) 40 MG tablet Take 1 tablet by mouth once daily 90 tablet 4   rosuvastatin (CRESTOR) 40 MG tablet Take 1 tablet (40 mg total) by mouth daily. 90 tablet 1   SYSTANE COMPLETE 0.6 % SOLN Place 1 drop into both eyes 3 (three) times daily as needed (dry/irritated eyes.).     tadalafil (CIALIS) 20 MG tablet Take 1 tablet (20 mg total) by mouth daily as needed for erectile dysfunction. 20 tablet 3   No current facility-administered medications for this visit.    Family History Family History  Problem  Relation Age of Onset   Hypertension Mother       Social History Social History   Tobacco Use   Smoking status: Former   Smokeless tobacco: Former    Types: Chew    Quit date: 11/27/1968   Tobacco comments:    used 2 packs per week; quit over 40 years ago  Vaping Use   Vaping status: Never Used  Substance Use Topics   Alcohol use: Not Currently    Alcohol/week: 0.0 standard drinks of alcohol   Drug use: No        Review of Systems  Constitutional: Negative.   HENT: Negative.    Eyes:  Positive for blurred vision.  Respiratory: Negative.    Cardiovascular: Negative.   Gastrointestinal:  Positive for blood in stool. Negative for abdominal pain, constipation, diarrhea, heartburn, melena, nausea and vomiting.  Genitourinary: Negative.   Skin: Negative.   Neurological: Negative.   Psychiatric/Behavioral: Negative.  Physical Exam Blood pressure (!) 162/84, pulse 97, temperature 98.6 F (37 C), temperature source Oral, height 6\' 1"  (1.854 m), weight 187 lb 6.4 oz (85 kg), SpO2 96%. Last Weight  Most recent update: 07/10/2023 10:18 AM    Weight  85 kg (187 lb 6.4 oz)             CONSTITUTIONAL: Well developed, and nourished, appropriately responsive and aware without distress.   EYES: Sclera non-icteric.   EARS, NOSE, MOUTH AND THROAT:  The oropharynx is clear. Oral mucosa is pink and moist.    Hearing is intact to voice.  NECK: Trachea is midline, and there is no jugular venous distension.  LYMPH NODES:  Lymph nodes in the neck are not appreciated. RESPIRATORY:   Normal respiratory effort without pathologic use of accessory muscles. CARDIOVASCULAR:  Well perfused.  GI: The abdomen is  soft, nontender, and nondistended. There were no palpable masses.  GU: Marylene Land present as chaperone.  On examination he had some mucosal prolapse through the anal sphincter, 1 polypoid appearing tag had some inflammatory changes, possibly consistent with a warty etiology.  This  seemed primarily oriented about 11:00.  On DRE, at approximately 730 there was an area internally with the same granular texture to it.  Circumferentially he has some significant internal hemorrhoidal redundancy, with associated external prominent hemorrhoids.  No evidence of ulceration, some firmness perhaps due to recent thrombosis at approximately 530.  No remarkable tenderness on exam.  No bright red blood on exam, no stool. MUSCULOSKELETAL:  Symmetrical muscle tone appreciated in all four extremities.    SKIN: Skin turgor is normal. No pathologic skin lesions appreciated.   Skin tags noted posterior left knee. NEUROLOGIC:  Motor and sensation appear grossly normal.  Cranial nerves are grossly without defect. PSYCH:  Alert and oriented to person, place and time. Affect is appropriate for situation.  Data Reviewed I have personally reviewed what is currently available of the patient's imaging, recent labs and medical records.   Labs:     Latest Ref Rng & Units 06/29/2023   10:35 AM 06/11/2023    3:12 PM 06/11/2023   11:11 AM  CBC  WBC 3.4 - 10.8 x10E3/uL 4.5  4.6  4.4   Hemoglobin 13.0 - 17.7 g/dL 8.1  8.2  8.8   Hematocrit 37.5 - 51.0 % 29.0  29.1  31.6   Platelets 150 - 450 x10E3/uL 251  222  261       Latest Ref Rng & Units 06/11/2023   11:11 AM 04/18/2023   11:34 AM 12/21/2021   11:03 AM  CMP  Glucose 70 - 99 mg/dL 409  811  914   BUN 8 - 23 mg/dL 11  12  15    Creatinine 0.61 - 1.24 mg/dL 7.82  9.56  2.13   Sodium 135 - 145 mmol/L 137  139  143   Potassium 3.5 - 5.1 mmol/L 4.1  4.3  4.5   Chloride 98 - 111 mmol/L 106  103  106   CO2 22 - 32 mmol/L 22  22  23    Calcium 8.9 - 10.3 mg/dL 9.2  9.2  9.8   Total Protein 6.5 - 8.1 g/dL 7.4  7.2  7.6   Total Bilirubin 0.3 - 1.2 mg/dL 0.9  0.4  0.5   Alkaline Phos 38 - 126 U/L 47  67  67   AST 15 - 41 U/L 23  20  16    ALT 0 - 44 U/L  21  18  18      Imaging: Radiological images reviewed:   Within last 24 hrs: No results  found.  Assessment    Internal and external hemorrhoids, grade 3 internal hemorrhoids present.  Some degree of fibrosis consistent with benign anal polyp formation, possible granular changes of internal hemorrhoidal mucosa consistent with possible verrucous change. Patient Active Problem List   Diagnosis Date Noted   History of lower GI bleeding 07/24/2021   Paralysis, diaphragm 05/06/2020   Personal history of COVID-19 11/07/2019   S/P CABG x 4 08/12/2019   History of adenomatous polyp of colon 07/05/2019   Gastric polyp    Iron (Fe) deficiency anemia 04/19/2017   Fracture of metatarsal bone, right sequela 06/04/2015   H/O acute myocardial infarction 06/04/2015   Lumbar radiculopathy 06/04/2015   Sciatica 02/22/2009   Type 2 diabetes mellitus with microalbuminuria, without long-term current use of insulin (HCC) 01/07/2009   Lumbosacral spondylosis 12/06/2008   ED (erectile dysfunction) of organic origin 10/18/2008   Atherosclerotic heart disease of native coronary artery with other forms of angina pectoris (HCC) 10/16/2008   HLD (hyperlipidemia) 07/23/2008   Essential (primary) hypertension 07/23/2008    Plan    Advised to proceed with rectal exam under anesthesia, with hemorrhoidectomy/biopsy of these areas.  Options of observation entertained, patient preferred not to pursue surgery at this time, excepting the risks of delaying diagnosis and prolonging mystery of these lesions.  He would like to follow-up in 69-month interval.  Will be glad to see him sooner should he have a change of heart.   Face-to-face time spent with the patient and accompanying care providers(if present) was 45 minutes, with more than 50% of the time spent counseling, educating, and coordinating care of the patient.    These notes generated with voice recognition software. I apologize for typographical errors.  Campbell Lerner M.D., FACS 07/10/2023, 10:56 AM

## 2023-07-10 NOTE — Patient Instructions (Addendum)
If you have any concerns or questions, please feel free to call our office. Follow up in 3-6 months.   Hemorrhoids Hemorrhoids are swollen veins that may form: In the butt (rectum). These are called internal hemorrhoids. Around the opening of the butt (anus). These are called external hemorrhoids. Most hemorrhoids do not cause very bad problems. They often get better with changes to your lifestyle and what you eat. What are the causes? Having trouble pooping (constipation) or watery poop (diarrhea). Pushing too hard when you poop. Pregnancy. Being very overweight (obese). Sitting for too long. Riding a bike for a long time. Heavy lifting or other things that take a lot of effort. Anal sex. What are the signs or symptoms? Pain. Itching or soreness in the butt. Bleeding from the butt. Leaking poop. Swelling. One or more lumps around the opening of your butt. How is this treated? In most cases, hemorrhoids can be treated at home. You may be told to: Change what you eat. Make changes to your lifestyle. If these treatments do not help, you may need to have a procedure done. Your doctor may need to: Place rubber bands at the bottom of the hemorrhoids to make them fall off. Put medicine into the hemorrhoids to shrink them. Shine a type of light on the hemorrhoids to cause them to fall off. Do surgery to get rid of the hemorrhoids. Follow these instructions at home: Medicines Take over-the-counter and prescription medicines only as told by your doctor. Use creams with medicine in them or medicines that you put in your butt as told by your doctor. Eating and drinking  Eat foods that have a lot of fiber in them. These include whole grains, beans, nuts, fruits, and vegetables. Ask your doctor about taking products that have fiber added to them (fibersupplements). Take in less fat. You can do this by: Eating low-fat dairy products. Eating less red meat. Staying away from processed  foods. Drink enough fluid to keep your pee (urine) pale yellow. Managing pain and swelling  Take a warm-water bath (sitz bath) for 20 minutes to ease pain. Do this 3-4 times a day. You may do this in a bathtub. You may also use a portable sitz bath that fits over the toilet. If told, put ice on the painful area. It may help to use ice between your warm baths. Put ice in a plastic bag. Place a towel between your skin and the bag. Leave the ice on for 20 minutes, 2-3 times a day. If your skin turns bright red, take off the ice right away to prevent skin damage. The risk of damage is higher if you cannot feel pain, heat, or cold. General instructions Exercise. Ask your doctor how much and what kind of exercise is best for you. Go to the bathroom when you need to poop. Do not wait. Try not to push too hard when you poop. Keep your butt dry and clean. Use wet toilet paper or moist towelettes after you poop. Do not sit on the toilet for a long time. Contact a doctor if: You have pain and swelling that do not get better with treatment. You have trouble pooping. You cannot poop. You have pain or swelling outside the area of the hemorrhoids. Get help right away if: You have bleeding from the butt that will not stop. This information is not intended to replace advice given to you by your health care provider. Make sure you discuss any questions you have with your health  care provider. Document Revised: 07/26/2022 Document Reviewed: 07/26/2022 Elsevier Patient Education  2024 [employer].

## 2023-07-31 ENCOUNTER — Other Ambulatory Visit: Payer: Self-pay | Admitting: Family Medicine

## 2023-07-31 DIAGNOSIS — E1129 Type 2 diabetes mellitus with other diabetic kidney complication: Secondary | ICD-10-CM

## 2023-07-31 DIAGNOSIS — M5430 Sciatica, unspecified side: Secondary | ICD-10-CM

## 2023-07-31 DIAGNOSIS — R52 Pain, unspecified: Secondary | ICD-10-CM

## 2023-07-31 DIAGNOSIS — M5416 Radiculopathy, lumbar region: Secondary | ICD-10-CM

## 2023-07-31 NOTE — Telephone Encounter (Unsigned)
Copied from CRM [id number]. Topic: General - Other >> Jul 31, 2023  9:06 AM Everette C wrote: Reason for CRM: Medication Refill - Medication: oxyCODONE-acetaminophen (PERCOCET) 10-325 MG tablet [[id number]]  metFORMIN (GLUCOPHAGE) 500 MG tablet [[id number]]  Has the patient contacted their pharmacy? Yes.   (Agent: If no, request that the patient contact the pharmacy for the refill. If patient does not wish to contact the pharmacy document the reason why and proceed with request.) (Agent: If yes, when and what did the pharmacy advise?)  Preferred Pharmacy (with phone number or street name): [facility] [address] ([address]), [place] - [address]) [place] [address] Phone: [phone] Fax: [phone] Hours: Not open 24 hours   Has the patient been seen for an appointment in the last year OR does the patient have an upcoming appointment? Yes.    Agent: Please be advised that RX refills may take up to 3 business days. We ask that you follow-up with your pharmacy.

## 2023-08-01 ENCOUNTER — Telehealth: Payer: Self-pay | Admitting: Family Medicine

## 2023-08-01 NOTE — Telephone Encounter (Signed)
Patient called to f/u his prescription for oxyCODONE-acetaminophen (PERCOCET) 10-325 MG tablet

## 2023-08-02 MED ORDER — METFORMIN HCL 500 MG PO TABS
1000.0000 mg | ORAL_TABLET | Freq: Two times a day (BID) | ORAL | 1 refills | Status: DC
Start: 2023-08-02 — End: 2024-02-22

## 2023-08-02 MED ORDER — OXYCODONE-ACETAMINOPHEN 10-325 MG PO TABS
1.0000 | ORAL_TABLET | Freq: Three times a day (TID) | ORAL | 0 refills | Status: DC | PRN
Start: 2023-08-02 — End: 2023-08-31

## 2023-08-02 NOTE — Telephone Encounter (Signed)
Requested medications are due for refill today.  yes  Requested medications are on the active medications list.  yes  Last refill. 06/29/2023 #60 0 rf  Future visit scheduled.   yes  Notes to clinic.  Refill not delegated.    Requested Prescriptions  Pending Prescriptions Disp Refills   oxyCODONE-acetaminophen (PERCOCET) 10-325 MG tablet 60 tablet 0    Sig: Take 1 tablet by mouth every 8 (eight) hours as needed for pain.     Not Delegated - Analgesics:  Opioid Agonist Combinations Failed - 07/31/2023  9:53 AM      Failed - This refill cannot be delegated      Failed - Urine Drug Screen completed in last 360 days      Passed - Valid encounter within last 3 months    Recent Outpatient Visits           1 month ago Iron deficiency anemia, unspecified iron deficiency anemia type   [place] [facility] Malva Limes, MD   3 months ago Type 2 diabetes mellitus with microalbuminuria, without long-term current use of insulin (HCC)   [place] [facility] Malva Limes, MD   6 months ago Type 2 diabetes mellitus with microalbuminuria, without long-term current use of insulin (HCC)   [place] [facility] Malva Limes, MD   9 months ago Type 2 diabetes mellitus with microalbuminuria, without long-term current use of insulin (HCC)   [place] [facility] Malva Limes, MD   1 year ago Type 2 diabetes mellitus with microalbuminuria, without long-term current use of insulin (HCC)    [place] [facility] Malva Limes, MD       Future Appointments             In 2 months Fisher, Demetrios Isaacs, MD [facility]            Signed Prescriptions Disp Refills   metFORMIN (GLUCOPHAGE) 500 MG tablet 360 tablet 1    Sig: Take 2 tablets (1,000 mg total) by mouth 2 (two) times daily with a meal.     Endocrinology:  Diabetes - Biguanides Failed - 07/31/2023  9:53 AM       Failed - B12 Level in normal range and within 720 days    Vitamin B-12  Date Value Ref Range Status  07/27/2021 402 180 - 914 pg/mL Final    Comment:    (NOTE) This assay is not validated for testing neonatal or myeloproliferative syndrome specimens for Vitamin B12 levels. Performed at [facility], [address], [place], [place] [address]          Passed - Cr in normal range and within 360 days    Creatinine, Ser  Date Value Ref Range Status  06/11/2023 0.83 0.61 - 1.24 mg/dL Final   Creatinine, POC  Date Value Ref Range Status  11/06/2017 n/a mg/dL Final         Passed - HBA1C is between 0 and 7.9 and within 180 days    Hemoglobin A1C  Date Value Ref Range Status  04/18/2023 6.7 (A) 4.0 - 5.6 % Final   Hgb A1c MFr Bld  Date Value Ref Range Status  12/21/2021 7.5 (H) 4.8 - 5.6 % Final    Comment:             Prediabetes: 5.7 - 6.4          Diabetes: >  6.4          Glycemic control for adults with diabetes: <7.0          Passed - eGFR in normal range and within 360 days    GFR calc Af Amer  Date Value Ref Range Status  05/08/2020 >60 >60 mL/min Final   GFR, Estimated  Date Value Ref Range Status  06/11/2023 >60 >60 mL/min Final    Comment:    (NOTE) Calculated using the CKD-EPI Creatinine Equation (2021)    eGFR  Date Value Ref Range Status  04/18/2023 87 >59 mL/min/1.73 Final         Passed - Valid encounter within last 6 months    Recent Outpatient Visits           1 month ago Iron deficiency anemia, unspecified iron deficiency anemia type   [place] [facility] Malva Limes, MD   3 months ago Type 2 diabetes mellitus with microalbuminuria, without long-term current use of insulin (HCC)   [place] [facility] Malva Limes, MD   6 months ago Type 2 diabetes mellitus with microalbuminuria, without long-term current use of insulin (HCC)   [place] [facility] Malva Limes, MD    9 months ago Type 2 diabetes mellitus with microalbuminuria, without long-term current use of insulin (HCC)   [place] [facility] Malva Limes, MD   1 year ago Type 2 diabetes mellitus with microalbuminuria, without long-term current use of insulin (HCC)   [place] [facility] Malva Limes, MD       Future Appointments             In 2 months Fisher, Demetrios Isaacs, MD [place]            Passed - CBC within normal limits and completed in the last 12 months    WBC  Date Value Ref Range Status  06/29/2023 4.5 3.4 - 10.8 x10E3/uL Final  06/11/2023 4.6 4.0 - 10.5 K/uL Final   RBC  Date Value Ref Range Status  06/29/2023 3.53 (L) 4.14 - 5.80 x10E6/uL Final  06/11/2023 3.43 (L) 4.22 - 5.81 MIL/uL Final   Hemoglobin  Date Value Ref Range Status  06/29/2023 8.1 (L) 13.0 - 17.7 g/dL Final   Hematocrit  Date Value Ref Range Status  06/29/2023 29.0 (L) 37.5 - 51.0 % Final   MCHC  Date Value Ref Range Status  06/29/2023 27.9 (L) 31.5 - 35.7 g/dL Final  96/02/5408 81.1 (L) 30.0 - 36.0 g/dL Final   [facility]  Date Value Ref Range Status  06/29/2023 22.9 (L) 26.6 - 33.0 pg Final  06/11/2023 23.9 (L) 26.0 - 34.0 pg Final   MCV  Date Value Ref Range Status  06/29/2023 82 79 - 97 fL Final   No results found for: "PLTCOUNTKUC", "LABPLAT", "POCPLA" RDW  Date Value Ref Range Status  06/29/2023 14.9 11.6 - 15.4 % Final

## 2023-08-02 NOTE — Telephone Encounter (Signed)
Requested Prescriptions  Pending Prescriptions Disp Refills   oxyCODONE-acetaminophen (PERCOCET) 10-325 MG tablet 60 tablet 0    Sig: Take 1 tablet by mouth every 8 (eight) hours as needed for pain.     Not Delegated - Analgesics:  Opioid Agonist Combinations Failed - 07/31/2023  9:53 AM      Failed - This refill cannot be delegated      Failed - Urine Drug Screen completed in last 360 days      Passed - Valid encounter within last 3 months    Recent Outpatient Visits           1 month ago Iron deficiency anemia, unspecified iron deficiency anemia type   [place] [facility] Malva Limes, MD   3 months ago Type 2 diabetes mellitus with microalbuminuria, without long-term current use of insulin (HCC)   [place] [facility] Malva Limes, MD   6 months ago Type 2 diabetes mellitus with microalbuminuria, without long-term current use of insulin (HCC)   [place] [facility] Malva Limes, MD   9 months ago Type 2 diabetes mellitus with microalbuminuria, without long-term current use of insulin (HCC)   [place] Malva Limes, MD   1 year ago Type 2 diabetes mellitus with microalbuminuria, without long-term current use of insulin (HCC)   [place] [facility] Malva Limes, MD       Future Appointments             In 2 months Fisher, Demetrios Isaacs, MD [place] [facility]             metFORMIN (GLUCOPHAGE) 500 MG tablet 360 tablet 1    Sig: Take 2 tablets (1,000 mg total) by mouth 2 (two) times daily with a meal.     Endocrinology:  Diabetes - Biguanides Failed - 07/31/2023  9:53 AM      Failed - B12 Level in normal range and within 720 days    Vitamin B-12  Date Value Ref Range Status  07/27/2021 402 180 - 914 pg/mL Final    Comment:    (NOTE) This assay is not validated for testing neonatal or myeloproliferative syndrome specimens for Vitamin  B12 levels. Performed at [facility], [address], [place], [place] [address]          Passed - Cr in normal range and within 360 days    Creatinine, Ser  Date Value Ref Range Status  06/11/2023 0.83 0.61 - 1.24 mg/dL Final   Creatinine, POC  Date Value Ref Range Status  11/06/2017 n/a mg/dL Final         Passed - HBA1C is between 0 and 7.9 and within 180 days    Hemoglobin A1C  Date Value Ref Range Status  04/18/2023 6.7 (A) 4.0 - 5.6 % Final   Hgb A1c MFr Bld  Date Value Ref Range Status  12/21/2021 7.5 (H) 4.8 - 5.6 % Final    Comment:             Prediabetes: 5.7 - 6.4          Diabetes: >6.4          Glycemic control for adults with diabetes: <7.0          Passed - eGFR in normal range and within 360 days    GFR calc Af Denyse Dago  Date Value Ref Range Status  05/08/2020 >60 >60 mL/min Final   GFR, Estimated  Date Value Ref Range Status  06/11/2023 >60 >60 mL/min Final    Comment:    (NOTE) Calculated using the CKD-EPI Creatinine Equation (2021)    eGFR  Date Value Ref Range Status  04/18/2023 87 >59 mL/min/1.73 Final         Passed - Valid encounter within last 6 months    Recent Outpatient Visits           1 month ago Iron deficiency anemia, unspecified iron deficiency anemia type   [place] [facility] Malva Limes, MD   3 months ago Type 2 diabetes mellitus with microalbuminuria, without long-term current use of insulin (HCC)   [place] [facility] Malva Limes, MD   6 months ago Type 2 diabetes mellitus with microalbuminuria, without long-term current use of insulin (HCC)   [place] [facility] Malva Limes, MD   9 months ago Type 2 diabetes mellitus with microalbuminuria, without long-term current use of insulin (HCC)   [place] [facility] Malva Limes, MD   1 year ago Type 2 diabetes mellitus with microalbuminuria, without long-term current use of  insulin (HCC)   [place] [facility] Malva Limes, MD       Future Appointments             In 2 months Fisher, Demetrios Isaacs, MD  [facility]            Passed - CBC within normal limits and completed in the last 12 months    WBC  Date Value Ref Range Status  06/29/2023 4.5 3.4 - 10.8 x10E3/uL Final  06/11/2023 4.6 4.0 - 10.5 K/uL Final   RBC  Date Value Ref Range Status  06/29/2023 3.53 (L) 4.14 - 5.80 x10E6/uL Final  06/11/2023 3.43 (L) 4.22 - 5.81 MIL/uL Final   Hemoglobin  Date Value Ref Range Status  06/29/2023 8.1 (L) 13.0 - 17.7 g/dL Final   Hematocrit  Date Value Ref Range Status  06/29/2023 29.0 (L) 37.5 - 51.0 % Final   MCHC  Date Value Ref Range Status  06/29/2023 27.9 (L) 31.5 - 35.7 g/dL Final  44/11/270 53.6 (L) 30.0 - 36.0 g/dL Final   [facility]  Date Value Ref Range Status  06/29/2023 22.9 (L) 26.6 - 33.0 pg Final  06/11/2023 23.9 (L) 26.0 - 34.0 pg Final   MCV  Date Value Ref Range Status  06/29/2023 82 79 - 97 fL Final   No results found for: "PLTCOUNTKUC", "LABPLAT", "POCPLA" RDW  Date Value Ref Range Status  06/29/2023 14.9 11.6 - 15.4 % Final

## 2023-08-27 DIAGNOSIS — I2510 Atherosclerotic heart disease of native coronary artery without angina pectoris: Secondary | ICD-10-CM | POA: Diagnosis not present

## 2023-08-27 DIAGNOSIS — E782 Mixed hyperlipidemia: Secondary | ICD-10-CM | POA: Diagnosis not present

## 2023-08-27 DIAGNOSIS — E119 Type 2 diabetes mellitus without complications: Secondary | ICD-10-CM | POA: Diagnosis not present

## 2023-08-27 DIAGNOSIS — Z955 Presence of coronary angioplasty implant and graft: Secondary | ICD-10-CM | POA: Diagnosis not present

## 2023-08-27 DIAGNOSIS — I10 Essential (primary) hypertension: Secondary | ICD-10-CM | POA: Diagnosis not present

## 2023-08-27 DIAGNOSIS — Z951 Presence of aortocoronary bypass graft: Secondary | ICD-10-CM | POA: Diagnosis not present

## 2023-08-31 ENCOUNTER — Other Ambulatory Visit: Payer: Self-pay | Admitting: Family Medicine

## 2023-08-31 ENCOUNTER — Telehealth: Payer: Self-pay | Admitting: Family Medicine

## 2023-08-31 DIAGNOSIS — M5430 Sciatica, unspecified side: Secondary | ICD-10-CM

## 2023-08-31 DIAGNOSIS — M5416 Radiculopathy, lumbar region: Secondary | ICD-10-CM

## 2023-08-31 DIAGNOSIS — R52 Pain, unspecified: Secondary | ICD-10-CM

## 2023-08-31 MED ORDER — OXYCODONE-ACETAMINOPHEN 10-325 MG PO TABS
1.0000 | ORAL_TABLET | Freq: Three times a day (TID) | ORAL | 0 refills | Status: DC | PRN
Start: 2023-08-31 — End: 2023-10-01

## 2023-08-31 NOTE — Telephone Encounter (Signed)
Medication Refill - Medication: oxyCODONE-acetaminophen (PERCOCET) 10-325 MG tablet [[id number]]   Pt reports that he has 1 pill left  Has the patient contacted their pharmacy? Yes.   (Agent: If no, request that the patient contact the pharmacy for the refill. If patient does not wish to contact the pharmacy document the reason why and proceed with request.) (Agent: If yes, when and what did the pharmacy advise?)  Preferred Pharmacy (with phone number or street name):  [facility] [address] ([address]), [place] - [address]) [place] [address] Phone: [phone]  Fax: [phone]   Has the patient been seen for an appointment in the last year OR does the patient have an upcoming appointment? Yes.    Agent: Please be advised that RX refills may take up to 3 business days. We ask that you follow-up with your pharmacy.

## 2023-08-31 NOTE — Telephone Encounter (Signed)
[facility] pharmacy faxed refill request for the following medications:   oxyCODONE-acetaminophen (PERCOCET) 10-325 MG tablet    Please advise

## 2023-08-31 NOTE — Telephone Encounter (Signed)
Requested medication (s) are due for refill today - yes  Requested medication (s) are on the active medication list -yes  Future visit scheduled -yes  Last refill: 08/02/23 #60  Notes to clinic: non delegated Rx  Requested Prescriptions  Pending Prescriptions Disp Refills   oxyCODONE-acetaminophen (PERCOCET) 10-325 MG tablet 60 tablet 0    Sig: Take 1 tablet by mouth every 8 (eight) hours as needed for pain.     Not Delegated - Analgesics:  Opioid Agonist Combinations Failed - 08/31/2023  3:34 PM      Failed - This refill cannot be delegated      Failed - Urine Drug Screen completed in last 360 days      Passed - Valid encounter within last 3 months    Recent Outpatient Visits           2 months ago Iron deficiency anemia, unspecified iron deficiency anemia type   [facility], [facility]   4 months ago Type 2 diabetes mellitus with microalbuminuria, without long-term current use of insulin (HCC)   [place] [facility] Malva Limes, MD   7 months ago Type 2 diabetes mellitus with microalbuminuria, without long-term current use of insulin (HCC)   [place] [facility] Malva Limes, MD   10 months ago Type 2 diabetes mellitus with microalbuminuria, without long-term current use of insulin (HCC)   [place] [facility] Malva Limes, MD   1 year ago Type 2 diabetes mellitus with microalbuminuria, without long-term current use of insulin (HCC)   [place] [facility] Malva Limes, MD       Future Appointments             In 1 month Fisher, Demetrios Isaacs, MD [place] [facility]               Requested Prescriptions  Pending Prescriptions Disp Refills   oxyCODONE-acetaminophen (PERCOCET) 10-325 MG tablet 60 tablet 0    Sig: Take 1 tablet by mouth every 8 (eight) hours as needed for pain.     Not Delegated - Analgesics:  Opioid Agonist  Combinations Failed - 08/31/2023  3:34 PM      Failed - This refill cannot be delegated      Failed - Urine Drug Screen completed in last 360 days      Passed - Valid encounter within last 3 months    Recent Outpatient Visits           2 months ago Iron deficiency anemia, unspecified iron deficiency anemia type   [facility] Malva Limes, MD   4 months ago Type 2 diabetes mellitus with microalbuminuria, without long-term current use of insulin (HCC)   [place] [facility] Malva Limes, MD   7 months ago Type 2 diabetes mellitus with microalbuminuria, without long-term current use of insulin (HCC)   [place] [facility] Malva Limes, MD   10 months ago Type 2 diabetes mellitus with microalbuminuria, without long-term current use of insulin [facility])   [place] [facility] Malva Limes, MD   1 year ago Type 2 diabetes mellitus with microalbuminuria, without long-term current use of insulin (HCC)    [facility] Malva Limes, MD       Future Appointments             In  1 month Fisher, Demetrios Isaacs, MD [facility]

## 2023-09-10 ENCOUNTER — Other Ambulatory Visit: Payer: Self-pay | Admitting: Pharmacist

## 2023-09-10 NOTE — Progress Notes (Signed)
09/10/2023 Name: Craig York MRN: [id number] DOB: 10-Apr-1951  Chief Complaint  Patient presents with   Medication Assistance    Craig York is a 72 y.o. year old male who was referred to pharmacist for patient assistance program re-enrollment for 2025 calendar year   Subjective:  Care Team: Primary Care Provider: Malva Limes, MD ; Next Scheduled Visit: 10/19/2023 Cardiologist: Mirian Capuchin, MD  Medication Access/Adherence  Current Pharmacy:  [facility] [address] ([address]), [place] - [address] [place] ([place]) [place] [address] Phone: [phone] Fax: [phone]  [facility] [id number] [place], [place] - [address] [place] [address] Phone: [phone] Fax: [phone]   Patient reports affordability concerns with their medications: No  Patient reports access/transportation concerns to their pharmacy: No  Patient reports adherence concerns with their medications:  No     Diabetes:  Current medications:  - Jardiance 25 mg daily - glipizide ER 5 mg daily with breakfast - metformin 500 mg - 2 tablets twice daily with meal  Current glucose readings: recalls last checked on 10/12, reading ~93  Patient denies hypoglycemic s/sx including dizziness, shakiness, sweating.   Current medication access support: enrolled in patient assistance for Jardiance through [employer] through 11/27/2023   Objective:  Lab Results  Component Value Date   HGBA1C 6.7 (A) 04/18/2023    Lab Results  Component Value Date   CREATININE 0.83 06/11/2023   BUN 11 06/11/2023   NA 137 06/11/2023   K 4.1 06/11/2023   CL 106 06/11/2023   CO2 22 06/11/2023    Lab Results  Component Value Date   CHOL 84 (L) 04/18/2023   HDL 41 04/18/2023   LDLCALC 25 04/18/2023   TRIG 90 04/18/2023   CHOLHDL 2.0 04/18/2023    Current Outpatient Medications on File Prior to Visit  Medication Sig  Dispense Refill   acetaminophen (TYLENOL) 500 MG tablet Take 1,000 mg by mouth every 6 (six) hours as needed for moderate pain.     aspirin EC 81 MG tablet Take 81 mg by mouth daily.     empagliflozin (JARDIANCE) 25 MG TABS tablet Take 25 mg by mouth daily.     ferrous sulfate 325 (65 FE) MG tablet Take 1 tablet (325 mg total) by mouth 2 (two) times daily with a meal. 60 tablet 0   fluticasone (FLONASE) 50 MCG/ACT nasal spray Place 2 sprays into both nostrils daily. 16 g 6   glipiZIDE (GLUCOTROL XL) 5 MG 24 hr tablet Take 1 tablet by mouth once daily with breakfast 90 tablet 4   losartan (COZAAR) 100 MG tablet Take 0.5 tablets (50 mg total) by mouth daily. 90 tablet 2   metFORMIN (GLUCOPHAGE) 500 MG tablet Take 2 tablets (1,000 mg total) by mouth 2 (two) times daily with a meal. 360 tablet 1   metoprolol succinate (TOPROL-XL) 25 MG 24 hr tablet Take 1 tablet by mouth once daily 90 tablet 4   oxyCODONE-acetaminophen (PERCOCET) 10-325 MG tablet Take 1 tablet by mouth every 8 (eight) hours as needed for pain. 60 tablet 0   pantoprazole (PROTONIX) 40 MG tablet Take 1 tablet by mouth once daily 90 tablet 4   rosuvastatin (CRESTOR) 40 MG tablet Take 1 tablet (40 mg total) by mouth daily. 90 tablet 1   SYSTANE COMPLETE 0.6 % SOLN Place 1 drop into both eyes 3 (three) times daily as needed (dry/irritated eyes.).  tadalafil (CIALIS) 20 MG tablet Take 1 tablet (20 mg total) by mouth daily as needed for erectile dysfunction. 20 tablet 3   No current facility-administered medications on file prior to visit.      Assessment/Plan:   Diabetes: - Currently controlled - Reviewed dietary modifications including importance of having regular well-balanced meals and snacks throughout the day, while controlling carbohydrate portion sizes - Recommend to check glucose, keep log of results and have this record to review at upcoming medical appointments. Patient to contact provider office sooner if needed for  readings outside of established parameters or symptoms - Will collaborate with provider, CPhT, and patient to pursue re-enrollment in patient assistance program for Jardiance from [facility]   Estelle Grumbles, PharmD, [facility] [phone]

## 2023-09-10 NOTE — Patient Instructions (Signed)
It was a pleasure speaking with you today!  Please watch the mail for an envelope from [employer] containing the patient assistance program application. Please complete this application and mail back to [facility] Technician Noreene Larsson Simcox along with a copy of your Medicare Part D prescription card and a copy of your proof of income document OR you can bring these documents to the office to have them faxed back to Attention: Pattricia Boss at Fax # [phone]   If you need to call Noreene Larsson, you can reach her at [phone]  If you need to reach out to patient assistance programs regarding refills or to find out the status of your application, you can do so by calling:  [employer] [phone]   Thank you!   Estelle Grumbles, PharmD, [facility] [phone]

## 2023-09-13 ENCOUNTER — Telehealth: Payer: Self-pay | Admitting: Pharmacy Technician

## 2023-09-13 DIAGNOSIS — Z5986 Financial insecurity: Secondary | ICD-10-CM

## 2023-09-13 NOTE — Progress Notes (Addendum)
[facility])                                            [facility] Quality Pharmacy Team   10/03/2023-ADDENDUM Patient was outreached as his application was returned to [place]. Updated address and re-mailed application to patient as outlined below.  09/13/2023  [facility] [employer] Oct 19, 1951 [id number]                                      Medication Assistance Referral  Referral From: [facility] Embedded RPh Vallery Sa   Medication/Company: [place] [employer] / [employer] Patient application portion:  Mailed Provider application portion: Faxed  to Dr. [name] Provider address/fax verified via: Office website   Pattricia Boss, CPhT [place]  Office: [phone] Fax: [phone] Email: [email or web address]

## 2023-09-20 DIAGNOSIS — E113393 Type 2 diabetes mellitus with moderate nonproliferative diabetic retinopathy without macular edema, bilateral: Secondary | ICD-10-CM | POA: Diagnosis not present

## 2023-10-01 ENCOUNTER — Other Ambulatory Visit: Payer: Self-pay | Admitting: Family Medicine

## 2023-10-01 DIAGNOSIS — M5430 Sciatica, unspecified side: Secondary | ICD-10-CM

## 2023-10-01 DIAGNOSIS — M5416 Radiculopathy, lumbar region: Secondary | ICD-10-CM

## 2023-10-01 DIAGNOSIS — R52 Pain, unspecified: Secondary | ICD-10-CM

## 2023-10-01 MED ORDER — EMPAGLIFLOZIN 25 MG PO TABS: 25.0000 mg | ORAL_TABLET | Freq: Every day | ORAL | 4 refills | Status: DC

## 2023-10-01 MED ORDER — OXYCODONE-ACETAMINOPHEN 10-325 MG PO TABS
1.0000 | ORAL_TABLET | Freq: Three times a day (TID) | ORAL | 0 refills | Status: DC | PRN
Start: 2023-10-01 — End: 2023-10-28

## 2023-10-01 NOTE — Telephone Encounter (Signed)
1-Patient came by to request refill on Oxycodone 10-325 mg. Sent to [facility] [address]  2- sending patient assistance paperwork back for Jardiance. Please complete and fax to # on form.

## 2023-10-01 NOTE — Progress Notes (Unsigned)
For pt assistance.  

## 2023-10-19 ENCOUNTER — Ambulatory Visit (INDEPENDENT_AMBULATORY_CARE_PROVIDER_SITE_OTHER): Payer: Medicare HMO | Admitting: Family Medicine

## 2023-10-19 ENCOUNTER — Encounter: Payer: Self-pay | Admitting: Family Medicine

## 2023-10-19 VITALS — BP 140/76 | HR 69 | Resp 16 | Ht 73.0 in | Wt 188.0 lb

## 2023-10-19 DIAGNOSIS — E1129 Type 2 diabetes mellitus with other diabetic kidney complication: Secondary | ICD-10-CM | POA: Diagnosis not present

## 2023-10-19 DIAGNOSIS — R809 Proteinuria, unspecified: Secondary | ICD-10-CM

## 2023-10-19 DIAGNOSIS — M5416 Radiculopathy, lumbar region: Secondary | ICD-10-CM | POA: Diagnosis not present

## 2023-10-19 DIAGNOSIS — M47817 Spondylosis without myelopathy or radiculopathy, lumbosacral region: Secondary | ICD-10-CM

## 2023-10-19 DIAGNOSIS — I10 Essential (primary) hypertension: Secondary | ICD-10-CM

## 2023-10-19 DIAGNOSIS — Z7984 Long term (current) use of oral hypoglycemic drugs: Secondary | ICD-10-CM

## 2023-10-19 LAB — POCT GLYCOSYLATED HEMOGLOBIN (HGB A1C)
Est. average glucose Bld gHb Est-mCnc: 123
Hemoglobin A1C: 5.9 % — AB (ref 4.0–5.6)

## 2023-10-19 NOTE — Progress Notes (Signed)
Established patient visit   Patient: Craig York   DOB: Dec 04, 1950   72 y.o. Male  MRN: [id number] Visit Date: 10/19/2023  Today's healthcare provider: [name], MD   Chief Complaint  Patient presents with   Medical Management of Chronic Issues    HTN and DM   Diabetes   Hypertension   Subjective     Patient presents for follow up diabetes and hypertension, doing well on current medications without complaints. He also continues on hydrocodone/apap for OA and DDD of back, which is very helpful, but he is frustrated that he has to make multiple calls every month to get it filled.   Lab Results  Component Value Date   HGBA1C 5.9 (A) 10/19/2023   HGBA1C 6.7 (A) 04/18/2023   HGBA1C 6.4 (A) 01/16/2023    Medications: Outpatient Medications Prior to Visit  Medication Sig   acetaminophen (TYLENOL) 500 MG tablet Take 1,000 mg by mouth every 6 (six) hours as needed for moderate pain.   aspirin EC 81 MG tablet Take 81 mg by mouth daily.   empagliflozin (JARDIANCE) 25 MG TABS tablet Take 1 tablet (25 mg total) by mouth daily.   ferrous sulfate 325 (65 FE) MG tablet Take 1 tablet (325 mg total) by mouth 2 (two) times daily with a meal.   fluticasone (FLONASE) 50 MCG/ACT nasal spray Place 2 sprays into both nostrils daily.   glipiZIDE (GLUCOTROL XL) 5 MG 24 hr tablet Take 1 tablet by mouth once daily with breakfast   losartan (COZAAR) 50 MG tablet Take 50 mg by mouth daily.   metFORMIN (GLUCOPHAGE) 500 MG tablet Take 2 tablets (1,000 mg total) by mouth 2 (two) times daily with a meal.   metoprolol succinate (TOPROL-XL) 25 MG 24 hr tablet Take 1 tablet by mouth once daily   oxyCODONE-acetaminophen (PERCOCET) 10-325 MG tablet Take 1 tablet by mouth every 8 (eight) hours as needed for pain.   pantoprazole (PROTONIX) 40 MG tablet Take 1 tablet by mouth once daily   rosuvastatin (CRESTOR) 40 MG tablet Take 1 tablet (40 mg total) by mouth daily.   SYSTANE COMPLETE 0.6 % SOLN  Place 1 drop into both eyes 3 (three) times daily as needed (dry/irritated eyes.).   tadalafil (CIALIS) 20 MG tablet Take 1 tablet (20 mg total) by mouth daily as needed for erectile dysfunction.   [DISCONTINUED] losartan (COZAAR) 100 MG tablet Take 0.5 tablets (50 mg total) by mouth daily.   No facility-administered medications prior to visit.    Review of Systems  Constitutional:  Negative for appetite change, chills and fever.  Respiratory:  Negative for chest tightness, shortness of breath and wheezing.   Cardiovascular:  Negative for chest pain and palpitations.  Gastrointestinal:  Negative for abdominal pain, nausea and vomiting.       Objective    BP (!) 140/76 (BP Location: Left Arm, Patient Position: Sitting, Cuff Size: Normal)   Pulse 69   Resp 16   Ht 6\' 1"  (1.854 m)   Wt 188 lb (85.3 kg)   BMI 24.80 kg/m    Physical Exam  General appearance: Well developed, well nourished male, cooperative and in no acute distress Head: Normocephalic, without obvious abnormality, atraumatic Respiratory: Respirations even and unlabored, normal respiratory rate Extremities: All extremities are intact.  Skin: Skin color, texture, turgor normal. No rashes seen  Psych: Appropriate mood and affect. Neurologic: Mental status: Alert, oriented to person, place, and time, thought content appropriate.  Results for orders placed or performed in visit on 10/19/23  POCT glycosylated hemoglobin (Hb A1C)  Result Value Ref Range   Hemoglobin A1C 5.9 (A) 4.0 - 5.6 %   Est. average glucose Bld gHb Est-mCnc 123     Assessment & Plan     1. Type 2 diabetes mellitus with microalbuminuria, without long-term current use of insulin (HCC) Very well controlled. Anticipate reduction glipizide dose with next refill. He only has a few days of Jardiance left. Given 2 weeks samples. Will contact rPh regarding patient assistance program.   2. Essential (primary) hypertension Well controlled.  Continue  current medications.   - losartan (COZAAR) 50 MG tablet; Take 50 mg by mouth daily.  3. Spondylosis of lumbosacral region without myelopathy or radiculopathy   4. Lumbar radiculopathy Doing well on current dose of hydrocodone/apap   General Health Maintenance -Declined pneumonia and flu vaccines. Discussed benefits and risks.  Return in about 4 months (around 02/16/2024) for Hypertension, Diabetes.         [name], MD  [facility] [phone] (phone) [phone] (fax)  [facility]

## 2023-10-19 NOTE — Patient Instructions (Signed)
Please review the attached list of medications and notify my office if there are any errors.   I recommend that you get the Prevnar 20 vaccine to protect yourself from certain dangerous strains of pneumonia. You can get Prevnar 20 at your pharmacy, or call our office at [phone] at your earliest convenience to schedule this vaccine.

## 2023-10-21 DIAGNOSIS — Z0100 Encounter for examination of eyes and vision without abnormal findings: Secondary | ICD-10-CM | POA: Diagnosis not present

## 2023-10-28 ENCOUNTER — Other Ambulatory Visit: Payer: Self-pay | Admitting: Family Medicine

## 2023-10-28 DIAGNOSIS — M5430 Sciatica, unspecified side: Secondary | ICD-10-CM

## 2023-10-28 DIAGNOSIS — M5416 Radiculopathy, lumbar region: Secondary | ICD-10-CM

## 2023-10-28 DIAGNOSIS — R52 Pain, unspecified: Secondary | ICD-10-CM

## 2023-10-28 MED ORDER — OXYCODONE-ACETAMINOPHEN 10-325 MG PO TABS
1.0000 | ORAL_TABLET | Freq: Three times a day (TID) | ORAL | 0 refills | Status: DC | PRN
Start: 2023-10-28 — End: 2023-11-29

## 2023-10-30 ENCOUNTER — Other Ambulatory Visit: Payer: Self-pay | Admitting: Pharmacy Technician

## 2023-10-30 ENCOUNTER — Encounter: Payer: Self-pay | Admitting: Family Medicine

## 2023-10-30 DIAGNOSIS — Z5986 Financial insecurity: Secondary | ICD-10-CM

## 2023-10-30 NOTE — Progress Notes (Signed)
Pharmacy Medication Assistance Program Note    10/30/2023  Patient ID: DESJUAN CROSSLIN, male   DOB: [birth year], 72 y.o.   MRN: [id number]     10/30/2023  Outreach Medication One  Initial Outreach Date (Medication One) 09/13/2023  Manufacturer Medication One [employer]  [employer] Drugs Jardiance  Dose of Jardiance 25mg   Type of Radiographer, therapeutic Assistance  Date Application Sent to Patient 09/13/2023  Application Items Requested Application;Proof of Income;Other  Date Application Sent to Prescriber 09/13/2023  Name of Prescriber [name]  Date Application Received From Provider 09/21/2023  Patient Assistance Determination Approved  Approval Start Date 11/28/2023  Approval End Date 11/26/2024  Patient Notification Method Telephone Call  Telephone Call Outcome Successful  Additional Outreach Contact Patient  Contacted Patient Telephone Call      Received secure chat message from Dr. Sherrie Mustache inquiring about Jardiance refill for patient as he provided patient samples at his OV a few weeks ago. Called BI and spoke to [place] and requested a refill be expedited to patient's home address. Remi Deter was able to process the refill and made the request for expedited shipping. IF approved, the Jardiance would be delivered in a few days, if not it would be delivered in about 7-10 business days.  While on the phone, inquired if Remi Deter and [employer] cares had received a re enrollment application. Per Remi Deter, Patient has been APPROVED 11/28/23-11/26/24. Remi Deter was also able to place patient on auto refills so the medication will auto refill and ship as it is due.  Called patient and updated him on this above information. Patient verbalized understanding.  Signature  Kristopher Glee [facility]  Office: [phone] Fax: [phone] Email: [email or web address]

## 2023-10-30 NOTE — Progress Notes (Signed)
Per Noreene Larsson Simcox 10-30-2023  "[employer] will process his refill today per rep Remi Deter. I also set him up on auto refills. He should have the refill in a few days as long as they approved the expedited shipping I requested. if not it would be 7-10 business days. He is also approved for all of 2025 with the patient assistance company [facility]."

## 2023-11-29 ENCOUNTER — Other Ambulatory Visit: Payer: Self-pay | Admitting: Family Medicine

## 2023-11-29 ENCOUNTER — Telehealth: Payer: Self-pay | Admitting: Family Medicine

## 2023-11-29 DIAGNOSIS — M5416 Radiculopathy, lumbar region: Secondary | ICD-10-CM

## 2023-11-29 DIAGNOSIS — R52 Pain, unspecified: Secondary | ICD-10-CM

## 2023-11-29 DIAGNOSIS — M5430 Sciatica, unspecified side: Secondary | ICD-10-CM

## 2023-11-29 MED ORDER — OXYCODONE-ACETAMINOPHEN 10-325 MG PO TABS
1.0000 | ORAL_TABLET | Freq: Three times a day (TID) | ORAL | 0 refills | Status: DC | PRN
Start: 2023-11-29 — End: 2023-12-27

## 2023-11-29 NOTE — Telephone Encounter (Signed)
 Medication refilled today

## 2023-11-29 NOTE — Telephone Encounter (Addendum)
 Medication Refill -  Most Recent Primary Care Visit:  Provider: GASPER NANCYANN BRAVO  Department: [facility]  Visit Type: OFFICE VISIT  Date: 10/19/2023  Medication: Oycodone 10/325  Has the patient contacted their pharmacy? No (Agent: If no, request that the patient contact the pharmacy for the refill. If patient does not wish to contact the pharmacy document the reason why and proceed with request.) (Agent: If yes, when and what did the pharmacy advise?)  Is this the correct pharmacy for this prescription? Yes If no, delete pharmacy and type the correct one.  This is the patient's preferred pharmacy:   [facility] [address] ([address]), [place] - [address] [place]) [place] [address] Phone: [phone] Fax: [phone]   Has the prescription been filled recently? Yes  Is the patient out of the medication? Yes  Has the patient been seen for an appointment in the last year OR does the patient have an upcoming appointment? Yes  Can we respond through MyChart? No  Agent: Please be advised that Rx refills may take up to 3 business days. We ask that you follow-up with your pharmacy.

## 2023-12-14 ENCOUNTER — Telehealth: Payer: Self-pay

## 2023-12-14 NOTE — Telephone Encounter (Signed)
Copied from CRM [id number]. Topic: General - Other >> Dec 14, 2023  9:18 AM Shon Hale wrote: Reason for CRM: Pt received a call from [facility] informing him Dr. Sherrie Mustache would no longer be with [employer]. Pt is going to switch over to [facility].

## 2023-12-14 NOTE — Telephone Encounter (Signed)
Called pt and notified him that staff has spoken with [facility] and they advised that this is not true.

## 2023-12-19 ENCOUNTER — Other Ambulatory Visit: Payer: Self-pay | Admitting: Pharmacist

## 2023-12-19 DIAGNOSIS — E1129 Type 2 diabetes mellitus with other diabetic kidney complication: Secondary | ICD-10-CM

## 2023-12-19 NOTE — Patient Instructions (Signed)
Goals Addressed             This Visit's Progress    Pharmacy Goals       If you need to reach out to patient assistance programs regarding refills or to find out the status of your application, you can do so by calling:  [employer] [phone]  Thank you!  Estelle Grumbles, PharmD, [facility] [phone]

## 2023-12-19 NOTE — Progress Notes (Signed)
12/19/2023 Name: Craig York MRN: [id number] DOB: 1950/12/20  Chief Complaint  Patient presents with   Medication Assistance    Craig York is a 73 y.o. year old male who was referred to pharmacist for patient assistance program re-enrollment for 2025 calendar year     Subjective:   Care Team: Primary Care Provider: Malva Limes, MD; Next Scheduled Visit: 02/15/2024 Cardiologist: Mirian Capuchin, MD; Next Scheduled Visit: 02/27/2024  Medication Access/Adherence  Current Pharmacy:  [facility] [address] ([address]), [name] - [address] [place] ([place]) [place] [address] Phone: [phone] Fax: [phone]  [facility] [id number] [place], [place] - [address] [place] [address] Phone: [phone] Fax: [phone]   Patient reports affordability concerns with their medications: No  Patient reports access/transportation concerns to their pharmacy: No  Patient reports adherence concerns with their medications:  No       Diabetes:   Current medications:  - Jardiance 25 mg daily - glipizide ER 5 mg daily with breakfast - metformin 500 mg - 2 tablets twice daily with meal   Does not have home blood sugar readings to share today   Patient denies hypoglycemic s/sx including dizziness, shakiness, sweating.   Statin therapy: rosuvastatin 40 mg daily   Current medication access support: enrolled in patient assistance for Jardiance through [employer] through 11/26/2024   Objective:  Lab Results  Component Value Date   HGBA1C 5.9 (A) 10/19/2023    Lab Results  Component Value Date   CREATININE 0.83 06/11/2023   BUN 11 06/11/2023   NA 137 06/11/2023   K 4.1 06/11/2023   CL 106 06/11/2023   CO2 22 06/11/2023    Lab Results  Component Value Date   CHOL 84 (L) 04/18/2023   HDL 41 04/18/2023   LDLCALC 25 04/18/2023   TRIG 90 04/18/2023   CHOLHDL 2.0 04/18/2023     Medications Reviewed Today     Reviewed by Manuela Neptune, RPH-CPP (Pharmacist) on 12/19/23 at 1429  Med List Status: <None>   Medication Order Taking? Sig Documenting Provider Last Dose Status Informant  acetaminophen (TYLENOL) 500 MG tablet [id number]  Take 1,000 mg by mouth every 6 (six) hours as needed for moderate pain. [provider]  Active Self  aspirin EC 81 MG tablet [id number]  Take 81 mg by mouth daily. [provider]  Active Self  empagliflozin (JARDIANCE) 25 MG TABS tablet [id number] Yes Take 1 tablet (25 mg total) by mouth daily. Malva Limes, MD Taking Active   ferrous sulfate 325 (65 FE) MG tablet [id number]  Take 1 tablet (325 mg total) by mouth 2 (two) times daily with a meal. Marrion Coy, MD  Active Self  fluticasone (FLONASE) 50 MCG/ACT nasal spray [id number]  Place 2 sprays into both nostrils daily. Malva Limes, MD  Active   glipiZIDE (GLUCOTROL XL) 5 MG 24 hr tablet [id number] Yes Take 1 tablet by mouth once daily with breakfast Fisher, Demetrios Isaacs, MD Taking Active   losartan (COZAAR) 50 MG tablet [id number]  Take 50 mg by mouth daily. Dorothyann Peng D, MD  Active   metFORMIN (GLUCOPHAGE) 500 MG tablet [id number] Yes Take 2 tablets (1,000 mg total) by mouth 2 (two) times daily with a meal. Fisher, Demetrios Isaacs, MD Taking Active   metoprolol succinate (TOPROL-XL) 25 MG 24 hr tablet [id number]  Take 1 tablet by mouth once daily Fisher, Demetrios Isaacs, MD  Active   oxyCODONE-acetaminophen (PERCOCET) 10-325 MG tablet [id number]  Take 1 tablet by mouth every 8 (eight) hours as needed for pain. Malva Limes, MD  Active   pantoprazole (PROTONIX) 40 MG tablet [id number]  Take 1 tablet by mouth once daily Fisher, Demetrios Isaacs, MD  Active   rosuvastatin (CRESTOR) 40 MG tablet [id number]  Take 1 tablet (40 mg total) by mouth daily. Malva Limes, MD  Active   SYSTANE COMPLETE 0.6 % Criss Rosales [id number]  Place 1 drop into both eyes 3 (three) times daily as needed  (dry/irritated eyes.). [provider]  Active Self  tadalafil (CIALIS) 20 MG tablet [id number]  Take 1 tablet (20 mg total) by mouth daily as needed for erectile dysfunction. Malva Limes, MD  Active               Assessment/Plan:   Diabetes: - Currently controlled - Reviewed dietary modifications including importance of having regular well-balanced meals and snacks throughout the day, while controlling carbohydrate portion sizes - Recommend to check glucose, keep log of results and have this record to review at upcoming medical appointments. Patient to contact provider office sooner if needed for readings outside of established parameters or symptoms - Patient to follow up with [facility] as needed for refills of Jardiance  Follow Up Plan: Clinical Pharmacist will follow up with patient by telephone on 09/17/2024 at 3:00 PM      Estelle Grumbles, PharmD, [facility] [phone]

## 2023-12-27 ENCOUNTER — Other Ambulatory Visit: Payer: Self-pay | Admitting: Family Medicine

## 2023-12-27 DIAGNOSIS — R52 Pain, unspecified: Secondary | ICD-10-CM

## 2023-12-27 DIAGNOSIS — M5416 Radiculopathy, lumbar region: Secondary | ICD-10-CM

## 2023-12-27 DIAGNOSIS — M5430 Sciatica, unspecified side: Secondary | ICD-10-CM

## 2023-12-27 NOTE — Telephone Encounter (Signed)
Medication Refill -  Most Recent Primary Care Visit:  Provider: Malva Limes  Department: [facility]  Visit Type: OFFICE VISIT  Date: 10/19/2023  Medication: oxyCODONE-acetaminophen (PERCOCET) 10-325 MG tablet   Has the patient contacted their pharmacy? Yes (Agent: If no, request that the patient contact the pharmacy for the refill. If patient does not wish to contact the pharmacy document the reason why and proceed with request.) (Agent: If yes, when and what did the pharmacy advise?)  Is this the correct pharmacy for this prescription? Yes If no, delete pharmacy and type the correct one.  This is the patient's preferred pharmacy:  [facility] [address] ([address]),  - [address] [place]) [place] [address] Phone: [phone] Fax: [phone]  Has the prescription been filled recently? Yes  Is the patient out of the medication? No  Has the patient been seen for an appointment in the last year OR does the patient have an upcoming appointment? Yes  Can we respond through MyChart? No  Agent: Please be advised that Rx refills may take up to 3 business days. We ask that you follow-up with your pharmacy.

## 2023-12-28 MED ORDER — OXYCODONE-ACETAMINOPHEN 10-325 MG PO TABS
1.0000 | ORAL_TABLET | Freq: Three times a day (TID) | ORAL | 0 refills | Status: DC | PRN
Start: 2023-12-28 — End: 2024-01-25

## 2023-12-28 NOTE — Telephone Encounter (Signed)
Requested medication (s) are due for refill today - yes  Requested medication (s) are on the active medication list -yes  Future visit scheduled -yes  Last refill: 11/29/23 #60  Notes to clinic: non delegated Rx  Requested Prescriptions  Pending Prescriptions Disp Refills   oxyCODONE-acetaminophen (PERCOCET) 10-325 MG tablet 60 tablet 0    Sig: Take 1 tablet by mouth every 8 (eight) hours as needed for pain.     Not Delegated - Analgesics:  Opioid Agonist Combinations Failed - 12/28/2023  8:32 AM      Failed - This refill cannot be delegated      Failed - Urine Drug Screen completed in last 360 days      Passed - Valid encounter within last 3 months    Recent Outpatient Visits           2 months ago Type 2 diabetes mellitus with microalbuminuria, without long-term current use of insulin (HCC)   [facility] Malva Limes, MD   6 months ago Iron deficiency anemia, unspecified iron deficiency anemia type   [facility] Malva Limes, MD   8 months ago Type 2 diabetes mellitus with microalbuminuria, without long-term current use of insulin (HCC)   [facility] Malva Limes, MD   11 months ago Type 2 diabetes mellitus with microalbuminuria, without long-term current use of insulin (HCC)   [facility] Malva Limes, MD   1 year ago Type 2 diabetes mellitus with microalbuminuria, without long-term current use of insulin (HCC)   [facility] Malva Limes, MD       Future Appointments             In 1 month Fisher, Demetrios Isaacs, MD [facility]               Requested Prescriptions  Pending Prescriptions Disp Refills   oxyCODONE-acetaminophen (PERCOCET) 10-325 MG tablet 60 tablet 0    Sig: Take 1 tablet by mouth every 8 (eight) hours as needed for pain.     Not Delegated - Analgesics:  Opioid Agonist  Combinations Failed - 12/28/2023  8:32 AM      Failed - This refill cannot be delegated      Failed - Urine Drug Screen completed in last 360 days      Passed - Valid encounter within last 3 months    Recent Outpatient Visits           2 months ago Type 2 diabetes mellitus with microalbuminuria, without long-term current use of insulin (HCC)   [facility] Malva Limes, MD   6 months ago Iron deficiency anemia, unspecified iron deficiency anemia type   [facility] Malva Limes, MD   8 months ago Type 2 diabetes mellitus with microalbuminuria, without long-term current use of insulin [facility])   [facility] Malva Limes, MD   11 months ago Type 2 diabetes mellitus with microalbuminuria, without long-term current use of insulin [facility])   [facility] Malva Limes, MD   1 year ago Type 2 diabetes mellitus with microalbuminuria, without long-term current use of insulin (HCC)   [facility] Malva Limes, MD       Future Appointments             In  1 month Fisher, Demetrios Isaacs, MD [facility]

## 2024-01-25 ENCOUNTER — Other Ambulatory Visit: Payer: Self-pay | Admitting: Family Medicine

## 2024-01-25 DIAGNOSIS — R52 Pain, unspecified: Secondary | ICD-10-CM

## 2024-01-25 DIAGNOSIS — M5416 Radiculopathy, lumbar region: Secondary | ICD-10-CM

## 2024-01-25 DIAGNOSIS — M5430 Sciatica, unspecified side: Secondary | ICD-10-CM

## 2024-01-25 MED ORDER — OXYCODONE-ACETAMINOPHEN 10-325 MG PO TABS
1.0000 | ORAL_TABLET | Freq: Three times a day (TID) | ORAL | 0 refills | Status: DC | PRN
Start: 2024-01-25 — End: 2024-02-22

## 2024-02-15 ENCOUNTER — Ambulatory Visit (INDEPENDENT_AMBULATORY_CARE_PROVIDER_SITE_OTHER): Payer: Medicare HMO | Admitting: Family Medicine

## 2024-02-15 VITALS — BP 120/88 | HR 67 | Temp 97.9°F | Ht 73.0 in | Wt 187.0 lb

## 2024-02-15 DIAGNOSIS — D509 Iron deficiency anemia, unspecified: Secondary | ICD-10-CM | POA: Diagnosis not present

## 2024-02-15 DIAGNOSIS — I10 Essential (primary) hypertension: Secondary | ICD-10-CM

## 2024-02-15 DIAGNOSIS — I25118 Atherosclerotic heart disease of native coronary artery with other forms of angina pectoris: Secondary | ICD-10-CM | POA: Diagnosis not present

## 2024-02-15 DIAGNOSIS — Z125 Encounter for screening for malignant neoplasm of prostate: Secondary | ICD-10-CM

## 2024-02-15 DIAGNOSIS — N529 Male erectile dysfunction, unspecified: Secondary | ICD-10-CM

## 2024-02-15 DIAGNOSIS — E782 Mixed hyperlipidemia: Secondary | ICD-10-CM | POA: Diagnosis not present

## 2024-02-15 DIAGNOSIS — R809 Proteinuria, unspecified: Secondary | ICD-10-CM

## 2024-02-15 DIAGNOSIS — M47817 Spondylosis without myelopathy or radiculopathy, lumbosacral region: Secondary | ICD-10-CM

## 2024-02-15 DIAGNOSIS — E1129 Type 2 diabetes mellitus with other diabetic kidney complication: Secondary | ICD-10-CM

## 2024-02-15 DIAGNOSIS — M5416 Radiculopathy, lumbar region: Secondary | ICD-10-CM | POA: Diagnosis not present

## 2024-02-15 MED ORDER — TADALAFIL 20 MG PO TABS: 20.0000 mg | ORAL_TABLET | Freq: Every day | ORAL | 3 refills | Status: DC | PRN

## 2024-02-15 NOTE — Progress Notes (Signed)
 Established patient visit   Patient: Craig York   DOB: [birth year]   73 y.o. Male  MRN: [id number] Visit Date: 02/15/2024  Today's healthcare provider: [name], MD   Chief Complaint  Patient presents with   Hypertension   Diabetes    Patient does not check his glucose much.     Subjective    Hypertension Pertinent negatives include no chest pain, palpitations or shortness of breath.  Diabetes Pertinent negatives for diabetes include no chest pain.   Follow up diabetes, htn, iron deficiency, CAD and ED. Feels well, no complaints today. Taking medications consistently. Tolerating without adverse effects. Continues regular follow up cardiology. Denies cp, dyspnea, or palpitations.   Lab Results  Component Value Date   HGBA1C 5.9 (A) 10/19/2023   HGBA1C 6.7 (A) 04/18/2023   HGBA1C 6.4 (A) 01/16/2023    Lab Results  Component Value Date   NA 137 06/11/2023   K 4.1 06/11/2023   CREATININE 0.83 06/11/2023   GFRNONAA >60 06/11/2023   GLUCOSE 165 (H) 06/11/2023   Lab Results  Component Value Date   CHOL 84 (L) 04/18/2023   HDL 41 04/18/2023   LDLCALC 25 04/18/2023   TRIG 90 04/18/2023   CHOLHDL 2.0 04/18/2023   Last CBC Lab Results  Component Value Date   WBC 4.5 06/29/2023   HGB 8.1 (L) 06/29/2023   HCT 29.0 (L) 06/29/2023   MCV 82 06/29/2023   MCH 22.9 (L) 06/29/2023   RDW 14.9 06/29/2023   PLT 251 06/29/2023   Lab Results  Component Value Date   IRON 258 (H) 06/29/2023   TIBC 469 (H) 04/18/2023   FERRITIN 10 (L) 06/29/2023     Medications: Outpatient Medications Prior to Visit  Medication Sig Note   acetaminophen (TYLENOL) 500 MG tablet Take 1,000 mg by mouth every 6 (six) hours as needed for moderate pain.    aspirin EC 81 MG tablet Take 81 mg by mouth daily.    empagliflozin (JARDIANCE) 25 MG TABS tablet Take 1 tablet (25 mg total) by mouth daily.    ferrous sulfate 325 (65 FE) MG tablet Take 1 tablet (325 mg total) by mouth 2  (two) times daily with a meal.    fluticasone (FLONASE) 50 MCG/ACT nasal spray Place 2 sprays into both nostrils daily.    glipiZIDE (GLUCOTROL XL) 5 MG 24 hr tablet Take 1 tablet by mouth once daily with breakfast    losartan (COZAAR) 50 MG tablet Take 50 mg by mouth daily.    metFORMIN (GLUCOPHAGE) 500 MG tablet Take 2 tablets (1,000 mg total) by mouth 2 (two) times daily with a meal. 02/15/2024: Patient unable to tolerate 1000 mg and is only taking 500 mg BID   metoprolol succinate (TOPROL-XL) 25 MG 24 hr tablet Take 1 tablet by mouth once daily    oxyCODONE-acetaminophen (PERCOCET) 10-325 MG tablet Take 1 tablet by mouth every 8 (eight) hours as needed for pain.    pantoprazole (PROTONIX) 40 MG tablet Take 1 tablet by mouth once daily    rosuvastatin (CRESTOR) 40 MG tablet Take 1 tablet (40 mg total) by mouth daily.    SYSTANE COMPLETE 0.6 % SOLN Place 1 drop into both eyes 3 (three) times daily as needed (dry/irritated eyes.).    tadalafil (CIALIS) 20 MG tablet Take 1 tablet (20 mg total) by mouth daily as needed for erectile dysfunction.    No facility-administered medications prior to visit.    Review of Systems  Constitutional:  Negative for appetite change, chills and fever.  Respiratory:  Negative for chest tightness, shortness of breath and wheezing.   Cardiovascular:  Negative for chest pain and palpitations.  Gastrointestinal:  Negative for abdominal pain, nausea and vomiting.       Objective    BP 120/88 (BP Location: Left Arm, Patient Position: Sitting, Cuff Size: Normal)   Pulse 67   Temp 97.9 F (36.6 C) (Oral)   Ht 6\' 1"  (1.854 m)   Wt 187 lb (84.8 kg)   SpO2 100%   BMI 24.67 kg/m    Physical Exam   General: Appearance:    Well developed, well nourished male in no acute distress  Eyes:    PERRL, conjunctiva/corneas clear, EOM's intact       Lungs:     Clear to auscultation bilaterally, respirations unlabored  Heart:    Normal heart rate. Normal rhythm. No  murmurs, rubs, or gallops.    MS:   All extremities are intact.    Neurologic:   Awake, alert, oriented x 3. No apparent focal neurological defect.         Assessment & Plan     1. Type 2 diabetes mellitus with microalbuminuria, without long-term current use of insulin (HCC) Doing well on current medications. Consider reducing glipizide dosage.  - Hemoglobin A1c  2. Essential (primary) hypertension Well controlled.  Continue current medications.   - Comprehensive metabolic panel  3. Mixed hyperlipidemia He is tolerating rosuvastatin well with no adverse effects.    4. Iron deficiency anemia, unspecified iron deficiency anemia type Taking iron supplement bid consistently.  - Iron, TIBC and Ferritin Panel - Hemoglobin and hematocrit, blood  5. Atherosclerotic heart disease of native coronary artery with other forms of angina pectoris (HCC) Asymptomatic. Compliant with medication.  Continue aggressive risk factor modification.  Continue regular follow up [place].   6. Lumbar radiculopathy   7. Spondylosis of lumbosacral region without myelopathy or radiculopathy Pain adequately controlled on current pain medications. Continue unchanged.   8. Prostate cancer screening (Primary)  - PSA Total (Reflex To Free)  9. ED (erectile dysfunction) of organic origin refill tadalafil (CIALIS) 20 MG tablet; Take 1 tablet (20 mg total) by mouth daily as needed for erectile dysfunction.  Dispense: 20 tablet; Refill: 3   Return in about 6 months (around 08/17/2024).         [name], MD  [facility] [phone] (phone) [phone] (fax)  [facility]

## 2024-02-15 NOTE — Patient Instructions (Signed)
 Craig York  Please review the attached list of medications and notify my office if there are any errors.   . Please bring all of your medications to every appointment so we can make sure that our medication list is the same as yours.

## 2024-02-16 LAB — COMPREHENSIVE METABOLIC PANEL
ALT: 13 IU/L (ref 0–44)
AST: 22 IU/L (ref 0–40)
Albumin: 4.9 g/dL — ABNORMAL HIGH (ref 3.8–4.8)
Alkaline Phosphatase: 65 IU/L (ref 44–121)
BUN/Creatinine Ratio: 16 (ref 10–24)
BUN: 15 mg/dL (ref 8–27)
Bilirubin Total: 0.5 mg/dL (ref 0.0–1.2)
CO2: 22 mmol/L (ref 20–29)
Calcium: 9.5 mg/dL (ref 8.6–10.2)
Chloride: 102 mmol/L (ref 96–106)
Creatinine, Ser: 0.94 mg/dL (ref 0.76–1.27)
Globulin, Total: 2.3 g/dL (ref 1.5–4.5)
Glucose: 108 mg/dL — ABNORMAL HIGH (ref 70–99)
Potassium: 4.4 mmol/L (ref 3.5–5.2)
Sodium: 138 mmol/L (ref 134–144)
Total Protein: 7.2 g/dL (ref 6.0–8.5)
eGFR: 86 mL/min/{1.73_m2} (ref >59)

## 2024-02-16 LAB — PSA TOTAL (REFLEX TO FREE): Prostate Specific Ag, Serum: 2.7 ng/mL (ref 0.0–4.0)

## 2024-02-16 LAB — HEMOGLOBIN A1C
Est. average glucose Bld gHb Est-mCnc: 131 mg/dL
Hgb A1c MFr Bld: 6.2 % — ABNORMAL HIGH (ref 4.8–5.6)

## 2024-02-16 LAB — IRON,TIBC AND FERRITIN PANEL
Ferritin: 13 ng/mL — ABNORMAL LOW (ref 30–400)
Iron Saturation: 75 % — ABNORMAL HIGH (ref 15–55)
Iron: 337 ug/dL (ref 38–169)
Total Iron Binding Capacity: 451 ug/dL — ABNORMAL HIGH (ref 250–450)
UIBC: 114 ug/dL (ref 111–343)

## 2024-02-16 LAB — HEMOGLOBIN AND HEMATOCRIT, BLOOD
Hematocrit: 32.2 % — ABNORMAL LOW (ref 37.5–51.0)
Hemoglobin: 9.3 g/dL — ABNORMAL LOW (ref 13.0–17.7)

## 2024-02-22 ENCOUNTER — Other Ambulatory Visit: Payer: Self-pay | Admitting: Family Medicine

## 2024-02-22 DIAGNOSIS — M5430 Sciatica, unspecified side: Secondary | ICD-10-CM

## 2024-02-22 DIAGNOSIS — R52 Pain, unspecified: Secondary | ICD-10-CM

## 2024-02-22 DIAGNOSIS — E1129 Type 2 diabetes mellitus with other diabetic kidney complication: Secondary | ICD-10-CM

## 2024-02-22 DIAGNOSIS — M5416 Radiculopathy, lumbar region: Secondary | ICD-10-CM

## 2024-02-22 MED ORDER — OXYCODONE-ACETAMINOPHEN 10-325 MG PO TABS
1.0000 | ORAL_TABLET | Freq: Three times a day (TID) | ORAL | 0 refills | Status: DC | PRN
Start: 2024-02-22 — End: 2024-03-25

## 2024-02-22 NOTE — Telephone Encounter (Signed)
 Copied from CRM [id number]. Topic: Clinical - Medication Refill >> Feb 22, 2024  9:09 AM Almira Coaster wrote: Most Recent Primary Care Visit:  Provider: Malva Limes  Department: [facility]  Visit Type: OFFICE VISIT  Date: 02/15/2024  Medication: glipiZIDE (GLUCOTROL XL) 5 MG 24 hr tablet & metFORMIN (GLUCOPHAGE) 500 MG tablet  Has the patient contacted their pharmacy? Yes (Agent: If no, request that the patient contact the pharmacy for the refill. If patient does not wish to contact the pharmacy document the reason why and proceed with request.) (Agent: If yes, when and what did the pharmacy advise?)  Is this the correct pharmacy for this prescription? Yes If no, delete pharmacy and type the correct one.  This is the patient's preferred pharmacy:  [facility] [address] ([address]), [place] - [address]) [place] [address] Phone: [phone] Fax: [phone]  [facility] [id number] [place], [place] - [address] [place] [address] Phone: [phone] Fax: [phone]   Has the prescription been filled recently? No  Is the patient out of the medication? No  Has the patient been seen for an appointment in the last year OR does the patient have an upcoming appointment? Yes  Can we respond through MyChart? No  Agent: Please be advised that Rx refills may take up to 3 business days. We ask that you follow-up with your pharmacy.

## 2024-02-25 MED ORDER — METFORMIN HCL 500 MG PO TABS
1000.0000 mg | ORAL_TABLET | Freq: Two times a day (BID) | ORAL | 1 refills | Status: DC
Start: 2024-02-25 — End: 2024-08-19

## 2024-02-25 NOTE — Telephone Encounter (Signed)
 Requested Prescriptions  Pending Prescriptions Disp Refills   metFORMIN (GLUCOPHAGE) 500 MG tablet 360 tablet 1    Sig: Take 2 tablets (1,000 mg total) by mouth 2 (two) times daily with a meal.     Endocrinology:  Diabetes - Biguanides Failed - 02/25/2024 11:18 AM      Failed - B12 Level in normal range and within 720 days    Vitamin B-12  Date Value Ref Range Status  07/27/2021 402 180 - 914 pg/mL Final    Comment:    (NOTE) This assay is not validated for testing neonatal or myeloproliferative syndrome specimens for Vitamin B12 levels. Performed at [facility], [address] [place], [place] [address]          Passed - Cr in normal range and within 360 days    Creatinine, Ser  Date Value Ref Range Status  02/15/2024 0.94 0.76 - 1.27 mg/dL Final   Creatinine, POC  Date Value Ref Range Status  11/06/2017 n/a mg/dL Final         Passed - HBA1C is between 0 and 7.9 and within 180 days    Hgb A1c MFr Bld  Date Value Ref Range Status  02/15/2024 6.2 (H) 4.8 - 5.6 % Final    Comment:             Prediabetes: 5.7 - 6.4          Diabetes: >6.4          Glycemic control for adults with diabetes: <7.0          Passed - eGFR in normal range and within 360 days    GFR calc Af Amer  Date Value Ref Range Status  05/08/2020 >60 >60 mL/min Final   GFR, Estimated  Date Value Ref Range Status  06/11/2023 >60 >60 mL/min Final    Comment:    (NOTE) Calculated using the CKD-EPI Creatinine Equation (2021)    eGFR  Date Value Ref Range Status  02/15/2024 86 >59 mL/min/1.73 Final         Passed - Valid encounter within last 6 months    Recent Outpatient Visits           1 week ago Prostate cancer screening   [facility] Malva Limes, MD       Future Appointments             In 5 months Fisher, Demetrios Isaacs, MD [facility]            Passed - CBC within normal limits and completed in the last 12  months    WBC  Date Value Ref Range Status  06/29/2023 4.5 3.4 - 10.8 x10E3/uL Final  06/11/2023 4.6 4.0 - 10.5 K/uL Final   RBC  Date Value Ref Range Status  06/29/2023 3.53 (L) 4.14 - 5.80 x10E6/uL Final  06/11/2023 3.43 (L) 4.22 - 5.81 MIL/uL Final   Hemoglobin  Date Value Ref Range Status  02/15/2024 9.3 (L) 13.0 - 17.7 g/dL Final   Hematocrit  Date Value Ref Range Status  02/15/2024 32.2 (L) 37.5 - 51.0 % Final   MCHC  Date Value Ref Range Status  06/29/2023 27.9 (L) 31.5 - 35.7 g/dL Final  28/41/3244 01.0 (L) 30.0 - 36.0 g/dL Final   [facility]  Date Value Ref Range Status  06/29/2023 22.9 (L) 26.6 - 33.0 pg Final  06/11/2023 23.9 (L) 26.0 - 34.0 pg Final  MCV  Date Value Ref Range Status  06/29/2023 82 79 - 97 fL Final   No results found for: "PLTCOUNTKUC", "LABPLAT", "POCPLA" RDW  Date Value Ref Range Status  06/29/2023 14.9 11.6 - 15.4 % Final          glipiZIDE (GLUCOTROL XL) 5 MG 24 hr tablet 90 tablet     Sig: Take 1 tablet (5 mg total) by mouth daily with breakfast.     Endocrinology:  Diabetes - Sulfonylureas Passed - 02/25/2024 11:18 AM      Passed - HBA1C is between 0 and 7.9 and within 180 days    Hgb A1c MFr Bld  Date Value Ref Range Status  02/15/2024 6.2 (H) 4.8 - 5.6 % Final    Comment:             Prediabetes: 5.7 - 6.4          Diabetes: >6.4          Glycemic control for adults with diabetes: <7.0          Passed - Cr in normal range and within 360 days    Creatinine, Ser  Date Value Ref Range Status  02/15/2024 0.94 0.76 - 1.27 mg/dL Final   Creatinine, POC  Date Value Ref Range Status  11/06/2017 n/a mg/dL Final         Passed - Valid encounter within last 6 months    Recent Outpatient Visits           1 week ago Prostate cancer screening   [facility] Malva Limes, MD       Future Appointments             In 5 months Fisher, Demetrios Isaacs, MD [facility]

## 2024-03-25 ENCOUNTER — Other Ambulatory Visit: Payer: Self-pay | Admitting: Family Medicine

## 2024-03-25 DIAGNOSIS — M5430 Sciatica, unspecified side: Secondary | ICD-10-CM

## 2024-03-25 DIAGNOSIS — R52 Pain, unspecified: Secondary | ICD-10-CM

## 2024-03-25 DIAGNOSIS — M5416 Radiculopathy, lumbar region: Secondary | ICD-10-CM

## 2024-03-25 MED ORDER — OXYCODONE-ACETAMINOPHEN 10-325 MG PO TABS
1.0000 | ORAL_TABLET | Freq: Three times a day (TID) | ORAL | 0 refills | Status: DC | PRN
Start: 2024-03-25 — End: 2024-04-23

## 2024-04-08 ENCOUNTER — Ambulatory Visit (INDEPENDENT_AMBULATORY_CARE_PROVIDER_SITE_OTHER): Payer: Self-pay

## 2024-04-08 VITALS — BP 130/82 | Ht 73.0 in | Wt 189.0 lb

## 2024-04-08 DIAGNOSIS — Z0000 Encounter for general adult medical examination without abnormal findings: Secondary | ICD-10-CM | POA: Diagnosis not present

## 2024-04-08 NOTE — Progress Notes (Signed)
 Subjective:   Craig York is a 73 y.o. who presents for a Medicare Wellness preventive visit.  As a reminder, Annual Wellness Visits don't include a physical exam, and some assessments may be limited, especially if this visit is performed virtually. We may recommend an in-person visit if needed.  Visit Complete: In person  Persons Participating in Visit: Patient.  AWV Questionnaire: No: Patient Medicare AWV questionnaire was not completed prior to this visit.  Cardiac Risk Factors include: advanced age (>43men, >64 women);microalbuminuria;hypertension;dyslipidemia;male gender;diabetes mellitus     Objective:     Today's Vitals   04/08/24 1004  BP: (!) 146/82  Weight: 189 lb (85.7 kg)  Height: 6\' 1"  (1.854 m)   Body mass index is 24.94 kg/m.     04/08/2024   10:16 AM 06/11/2023   11:04 AM 12/25/2022   11:12 AM 07/26/2021    1:12 PM 07/24/2021    8:29 PM 07/24/2021   12:48 PM 09/20/2020    3:58 PM  Advanced Directives  Does Patient Have a Medical Advance Directive? No No No No No No Yes  Type of Advance Directive       Living will  Would patient like information on creating a medical advance directive? No - Patient declined No - Patient declined No - Patient declined No - Patient declined No - Patient declined      Current Medications (verified) Outpatient Encounter Medications as of 04/08/2024  Medication Sig   acetaminophen  (TYLENOL ) 500 MG tablet Take 1,000 mg by mouth every 6 (six) hours as needed for moderate pain.   aspirin  EC 81 MG tablet Take 81 mg by mouth Craig York.   empagliflozin  (JARDIANCE ) 25 MG TABS tablet Take 1 tablet (25 mg total) by mouth Craig York.   ferrous sulfate  325 (65 FE) MG tablet Take 1 tablet (325 mg total) by mouth 2 (two) times Craig York with a meal.   fluticasone  (FLONASE ) 50 MCG/ACT nasal spray Place 2 sprays into both nostrils Craig York.   glipiZIDE  (GLUCOTROL  XL) 5 MG 24 hr tablet Take 1 tablet by mouth once Craig York with breakfast   losartan  (COZAAR )  50 MG tablet Take 50 mg by mouth Craig York.   metFORMIN  (GLUCOPHAGE ) 500 MG tablet Take 2 tablets (1,000 mg total) by mouth 2 (two) times Craig York with a meal.   metoprolol  succinate (TOPROL -XL) 25 MG 24 hr tablet Take 1 tablet by mouth once Craig York   oxyCODONE -acetaminophen  (PERCOCET) 10-325 MG tablet Take 1 tablet by mouth every 8 (eight) hours as needed for pain.   pantoprazole  (PROTONIX ) 40 MG tablet Take 1 tablet by mouth once Craig York   rosuvastatin  (CRESTOR ) 40 MG tablet Take 1 tablet (40 mg total) by mouth Craig York.   SYSTANE COMPLETE 0.6 % SOLN Place 1 drop into both eyes 3 (three) times Craig York as needed (dry/irritated eyes.).   tadalafil  (CIALIS ) 20 MG tablet Take 1 tablet (20 mg total) by mouth Craig York as needed for erectile dysfunction.   No facility-administered encounter medications on file as of 04/08/2024.    Allergies (verified) Simvastatin and Penicillins   History: Past Medical History:  Diagnosis Date   Anginal pain (HCC)    Benign neoplasm of descending colon    BPH (benign prostatic hyperplasia)    Coronary artery disease    Diabetes mellitus without complication (HCC)    Type II   Hemorrhage of gastrointestinal tract 07/23/2008   Hyperlipidemia    Hypertension    Iron deficiency anemia    Myocardial infarction (HCC)  Myocardial infarction involving left anterior descending (LAD) coronary artery (HCC) 12/25/2019   Polyp of sigmoid colon    Symptomatic anemia 04/12/2020   Past Surgical History:  Procedure Laterality Date   CARDIAC CATHETERIZATION     chest surgery for fungal infection     COLONOSCOPY WITH PROPOFOL  N/A 07/10/2017   Procedure: COLONOSCOPY WITH PROPOFOL ;  Surgeon: Marnee Sink, MD;  Location: [facility] ENDOSCOPY;  Service: Endoscopy;  Laterality: N/A;   COLONOSCOPY WITH PROPOFOL  N/A 04/13/2020   Procedure: COLONOSCOPY WITH PROPOFOL ;  Surgeon: Marnee Sink, MD;  Location: [facility] ENDOSCOPY;  Service: Endoscopy;  Laterality: N/A;   COLONOSCOPY WITH PROPOFOL  N/A 07/27/2021    Procedure: COLONOSCOPY WITH PROPOFOL ;  Surgeon: Shane Darling, MD;  Location[facility] ENDOSCOPY;  Service: Endoscopy;  Laterality: N/A;   CORONARY ARTERY BYPASS GRAFT N/A 08/12/2019   Procedure: CORONARY ARTERY BYPASS GRAFTING (CABG)X 4;  Surgeon: Rudine Cos, MD;  Location: [facility] OR;  Service: Open Heart Surgery;  Laterality: N/A;  CABG x  4  using bilateral internal mammary arteries and endoscopically harvested left saphenous vein   ESOPHAGOGASTRODUODENOSCOPY N/A 07/26/2021   Procedure: ESOPHAGOGASTRODUODENOSCOPY (EGD);  Surgeon: Shane Darling, MD;  Location: [facility] ENDOSCOPY;  Service: Endoscopy;  Laterality: N/A;   ESOPHAGOGASTRODUODENOSCOPY (EGD) WITH PROPOFOL  N/A 07/10/2017   Procedure: ESOPHAGOGASTRODUODENOSCOPY (EGD) WITH PROPOFOL ;  Surgeon: Marnee Sink, MD;  Location: [facility] ENDOSCOPY;  Service: Endoscopy;  Laterality: N/A;   ESOPHAGOGASTRODUODENOSCOPY (EGD) WITH PROPOFOL  N/A 04/13/2020   Procedure: ESOPHAGOGASTRODUODENOSCOPY (EGD) WITH PROPOFOL ;  Surgeon: Marnee Sink, MD;  Location: [facility] ENDOSCOPY;  Service: Endoscopy;  Laterality: N/A;   GIVENS CAPSULE STUDY N/A 04/14/2020   Procedure: GIVENS CAPSULE STUDY;  Surgeon: Luke Salaam, MD;  Location: [facility] ENDOSCOPY;  Service: Gastroenterology;  Laterality: N/A;   HERNIA REPAIR     INTERCOSTAL NERVE BLOCK Right 05/06/2020   Procedure: INTERCOSTAL NERVE BLOCK;  Surgeon: Hilarie Lovely, MD;  Location: [facility] OR;  Service: Thoracic;  Laterality: Right;   LEFT HEART CATH AND CORONARY ANGIOGRAPHY Left 07/21/2019   Procedure: LEFT HEART CATH AND CORONARY ANGIOGRAPHY;  Surgeon: Antonette Batters, MD;  Location: [facility] INVASIVE CV LAB;  Service: Cardiovascular;  Laterality: Left;   TEE WITHOUT CARDIOVERSION N/A 08/12/2019   Procedure: TRANSESOPHAGEAL ECHOCARDIOGRAM (TEE);  Surgeon: Rudine Cos, MD;  Location: [facility] OR;  Service: Open Heart Surgery;  Laterality: N/A;   Family History  Problem Relation Age of Onset   Hypertension Mother     Social History   Socioeconomic History   Marital status: Legally Separated    Spouse name: Not on file   Number of children: 2   Years of education: Not on file   Highest education level: High school graduate  Occupational History   Occupation: Retired    Comment: previously worked in Designer, fashion/clothing working on machines  Tobacco Use   Smoking status: Former   Smokeless tobacco: Former    Types: Chew    Quit date: 11/27/1968   Tobacco comments:    used 2 packs per week; quit over 40 years ago  Vaping Use   Vaping status: Never Used  Substance and Sexual Activity   Alcohol  use: Not Currently    Alcohol /week: 0.0 standard drinks of alcohol    Drug use: No   Sexual activity: Not on file  Other Topics Concern   Not on file  Social History Narrative   Not on file   Social Drivers of Health   Financial Resource Strain: Low Risk  (04/08/2024)   Overall Financial Resource  Strain (CARDIA)    Difficulty of Paying Living Expenses: Not very hard  Food Insecurity: No Food Insecurity (04/08/2024)   Hunger Vital Sign    Worried About Running Out of Food in the Last Year: Never true    Ran Out of Food in the Last Year: Never true  Transportation Needs: No Transportation Needs (04/08/2024)   PRAPARE - Administrator, Civil Service (Medical): No    Lack of Transportation (Non-Medical): No  Physical Activity: Insufficiently Active (04/08/2024)   Exercise Vital Sign    Days of Exercise per Week: 3 days    Minutes of Exercise per Session: 40 min  Stress: No Stress Concern Present (04/08/2024)   [employer] of Occupational Health - Occupational Stress Questionnaire    Feeling of Stress : Only a little  Social Connections: Moderately Integrated (04/08/2024)   Social Connection and Isolation Panel [NHANES]    Frequency of Communication with Friends and Family: More than three times a week    Frequency of Social Gatherings with Friends and Family: Twice a week    Attends Religious  Services: More than 4 times per year    Active Member of [employer] or Organizations: No    Attends Engineer, structural: Never    Marital Status: Married    Tobacco Counseling Counseling given: Not Answered Tobacco comments: used 2 packs per week; quit over 40 years ago    Clinical Intake:  Pre-visit preparation completed: Yes  Pain : No/denies pain     BMI - recorded: 24.94 Nutritional Status: BMI of 19-24  Normal Nutritional Risks: None Diabetes: Yes CBG done?: No Did pt. bring in CBG monitor from home?: No  Lab Results  Component Value Date   HGBA1C 6.2 (H) 02/15/2024   HGBA1C 5.9 (A) 10/19/2023   HGBA1C 6.7 (A) 04/18/2023     How often do you need to have someone help you when you read instructions, pamphlets, or other written materials from your doctor or pharmacy?: 1 - Never  Interpreter Needed?: No  Information entered by :: Dellie Fergusson, LPN   Activities of Craig York Living    04/08/2024   10:17 AM  In your present state of health, do you have any difficulty performing the following activities:  Hearing? 0  Vision? 0  Difficulty concentrating or making decisions? 0  Walking or climbing stairs? 0  Dressing or bathing? 0  Doing errands, shopping? 0  Preparing Food and eating ? N  Using the Toilet? N  In the past six months, have you accidently leaked urine? N  Do you have problems with loss of bowel control? N  Managing your Medications? N  Managing your Finances? N  Housekeeping or managing your Housekeeping? N    Patient Care Team: Lamon Pillow, MD as PCP - General (Family Medicine) Antonette Batters, MD as Consulting Physician (Cardiology) Hilarie Lovely, MD as Consulting Physician (Thoracic Surgery) Bary Boss., MD (Ophthalmology)  Indicate any recent Medical Services you may have received from other than [facility] providers in the past year (date may be approximate).     Assessment:    This is a routine wellness  examination for Craig York.  Hearing/Vision screen Hearing Screening - Comments:: NO AIDS Vision Screening - Comments:: WEARS GLASSES FOR DRIVING- [email or web address]   Goals Addressed             This Visit's Progress    DIET - EAT MORE FRUITS AND VEGETABLES  Depression Screen     04/08/2024   10:13 AM 10/19/2023   11:06 AM 01/16/2023   11:27 AM 12/25/2022   11:11 AM 12/21/2021   10:14 AM 04/18/2021   11:12 AM 09/20/2020    3:54 PM  PHQ 2/9 Scores  PHQ - 2 Score 0 0 0 0 0 0 0  PHQ- 9 Score 0  0 0 0 0     Fall Risk     04/08/2024   10:16 AM 10/19/2023   11:05 AM 01/16/2023   11:27 AM 12/25/2022   11:13 AM 12/21/2021   10:15 AM  Fall Risk   Falls in the past year? 0 0 0 0 0  Number falls in past yr: 0 0 0 0 0  Injury with Fall? 0 0 0 0 0  Risk for fall due to : No Fall Risks No Fall Risks  No Fall Risks No Fall Risks  Follow up Falls prevention discussed;Falls evaluation completed   Falls prevention discussed;Falls evaluation completed Falls evaluation completed    MEDICARE RISK AT HOME:  Medicare Risk at Home Any stairs in or around the home?: Yes If so, are there any without handrails?: No Home free of loose throw rugs in walkways, pet beds, electrical cords, etc?: Yes Adequate lighting in your home to reduce risk of falls?: Yes Life alert?: No Use of a cane, walker or w/c?: No Grab bars in the bathroom?: Yes Shower chair or bench in shower?: No Elevated toilet seat or a handicapped toilet?: No  TIMED UP AND GO:  Was the test performed?  Yes  Length of time to ambulate 10 feet: 4 sec Gait steady and fast without use of assistive device  Cognitive Function: 6CIT completed        04/08/2024   10:19 AM 12/25/2022   11:14 AM  6CIT Screen  What Year? 0 points 0 points  What month? 0 points 0 points  What time? 3 points 0 points  Count back from 20 0 points 0 points  Months in reverse 0 points 0 points  Repeat phrase 0 points 0 points  Total Score 3 points 0  points    Immunizations Immunization History  Administered Date(s) Administered   PFIZER(Purple Top)SARS-COV-2 Vaccination 02/18/2020, 03/10/2020, 09/23/2020   Pneumococcal Polysaccharide-23 08/26/2019   Tdap 05/28/2017    Screening Tests Health Maintenance  Topic Date Due   Zoster Vaccines- Shingrix (1 of 2) Never done   Pneumonia Vaccine 20+ Years old (2 of 2 - PCV) 08/25/2020   OPHTHALMOLOGY EXAM  05/03/2022   COVID-19 Vaccine (4 - 2024-25 season) 07/29/2023   Diabetic kidney evaluation - Urine ACR  04/17/2024   INFLUENZA VACCINE  06/27/2024   HEMOGLOBIN A1C  08/17/2024   Diabetic kidney evaluation - eGFR measurement  02/14/2025   Medicare Annual Wellness (AWV)  04/08/2025   Colonoscopy  07/27/2026   DTaP/Tdap/Td (2 - Td or Tdap) 05/29/2027   Hepatitis C Screening  Completed   HPV VACCINES  Aged Out   Meningococcal B Vaccine  Aged Out    Health Maintenance  Health Maintenance Due  Topic Date Due   Zoster Vaccines- Shingrix (1 of 2) Never done   Pneumonia Vaccine 66+ Years old (2 of 2 - PCV) 08/25/2020   OPHTHALMOLOGY EXAM  05/03/2022   COVID-19 Vaccine (4 - 2024-25 season) 07/29/2023   Diabetic kidney evaluation - Urine ACR  04/17/2024   Health Maintenance Items Addressed: DECLINES FUTURE COLONOSCOPY, UP TO DATE ON SHOTS EXCEPT SHINGRIX &  COVID  Additional Screening:  Vision Screening: Recommended annual ophthalmology exams for early detection of glaucoma and other disorders of the eye.  Dental Screening: Recommended annual dental exams for proper oral hygiene  Community Resource Referral / Chronic Care Management: CRR required this visit?  No   CCM required this visit?  No   Plan:    I have personally reviewed and noted the following in the patient's chart:   Medical and social history Use of alcohol , tobacco or illicit drugs  Current medications and supplements including opioid prescriptions. Patient is currently taking opioid prescriptions.  Information provided to patient regarding non-opioid alternatives. Patient advised to discuss non-opioid treatment plan with their provider. Functional ability and status Nutritional status Physical activity Advanced directives List of other physicians Hospitalizations, surgeries, and ER visits in previous 12 months Vitals Screenings to include cognitive, depression, and falls Referrals and appointments  In addition, I have reviewed and discussed with patient certain preventive protocols, quality metrics, and best practice recommendations. A written personalized care plan for preventive services as well as general preventive health recommendations were provided to patient.   Pinky Bright, LPN   1/[id number]/[id number]   After Visit Summary: (In Person-Declined) Patient declined AVS at this time.  Notes: Nothing significant to report at this time.

## 2024-04-08 NOTE — Patient Instructions (Signed)
 [name] , Thank you for taking time out of your busy schedule to complete your Annual Wellness Visit with me. I enjoyed our conversation and look forward to speaking with you again next year. I, as well as your care team,  appreciate your ongoing commitment to your health goals. Please review the following plan we discussed and let me know if I can assist you in the future.  Follow up Visits: Next Medicare AWV with our clinical staff: 04/15/25 @ 11:30 AM IN PERSON Have you seen your provider in the last 6 months (3 months if uncontrolled diabetes)? Yes   Clinician Recommendations:  Aim for 30 minutes of exercise or brisk walking, 6-8 glasses of water , and 5 servings of fruits and vegetables each day. TAKE CARE!      This is a list of the screening recommended for you and due dates:  Health Maintenance  Topic Date Due   Zoster (Shingles) Vaccine (1 of 2) Never done   Pneumonia Vaccine (2 of 2 - PCV) 08/25/2020   Eye exam for diabetics  05/03/2022   COVID-19 Vaccine (4 - 2024-25 season) 07/29/2023   Yearly kidney health urinalysis for diabetes  04/17/2024   Flu Shot  06/27/2024   Hemoglobin A1C  08/17/2024   Yearly kidney function blood test for diabetes  02/14/2025   Medicare Annual Wellness Visit  04/08/2025   Colon Cancer Screening  07/27/2026   DTaP/Tdap/Td vaccine (2 - Td or Tdap) 05/29/2027   Hepatitis C Screening  Completed   HPV Vaccine  Aged Out   Meningitis B Vaccine  Aged Out    Advanced directives: (ACP Link)Information on Advanced Care Planning can be found at [place]  Secretary of [facility] Advance Health Care Directives Advance Health Care Directives. [email or web address]  Advance Care Planning is important because it:  [x]  Makes sure you receive the medical care that is consistent with your values, goals, and preferences  [x]  It provides guidance to your family and loved ones and reduces their decisional burden about whether or not they are making the right decisions based  on your wishes.  Follow the link provided in your after visit summary or read over the paperwork we have mailed to you to help you started getting your Advance Directives in place. If you need assistance in completing these, please reach out to us  so that we can help you!

## 2024-04-14 ENCOUNTER — Ambulatory Visit: Payer: Self-pay | Admitting: *Deleted

## 2024-04-14 NOTE — Telephone Encounter (Signed)
  Chief Complaint: Requesting Motrin  800 mg for a pulled muscle in his lower back.   I pulled a muscle moving and picking up furniture last week.   I take the oxycodone  but the Motrin  800 mg works better and faster for this pain than the oxycodone .  (Checked his med list but did not see where he had been on it in the last several months so an appt was scheduled).    "I've taken it in the past and it works better". Symptoms: Lower back pain without radiation to either leg.   "I've had back pain a long time but it's hurting after moving furniture".   Frequency: Last week Pertinent Negatives: Patient denies radiation down either leg Disposition: [] ED /[] Urgent Care (no appt availability in office) / [x] Appointment(In office/virtual)/ []  [place] [facility]/ [] Home Care/ [] Refused Recommended Disposition /[] [place] [facility]/ []  Follow-up with PCP Additional Notes: Appt made with Dr. Shann Darnel for 04/18/2024 at 3:00.   He only wanted to see Dr. Shann Darnel.   Not able to come in for an earlier appt Dr. Shann Darnel had available.

## 2024-04-14 NOTE — Telephone Encounter (Signed)
 Message from [place] C sent at 04/14/2024  9:32 AM EDT  Summary: back discomfort / rx req   Copied From CRM [id number]. Reason for Triage: The patient has called to request a prescription for ibuprofen  (ADVIL ,MOTRIN ) 800 MG tablet [[id number]] due to a pull in their mid/lower back that's causing them discomfort  The patient would like to speak with a member of clinical staff about their concerns further when possible          Call History  Contact Date/Time Type Contact Phone/Fax By  04/14/2024 09:30 AM EDT Phone (Incoming) Cosmo Dirk, [place] A   Reason for Disposition  [1] MODERATE back pain (e.g., interferes with normal activities) AND [2] present > 3 days  Answer Assessment - Initial Assessment Questions 1. ONSET: "When did the pain begin?"      I'm having lower back pain.   I was moving and picking up furniture last week.  I've pulled a muscle in my back.  I'm wanting the 800 mg Motrin  they work faster.   I've run out of them.   I called in last week for it but it wasn't called in to my pharmacy. 2. LOCATION: "Where does it hurt?" (upper, mid or lower back)     Lower back above my tailbone. 3. SEVERITY: "How bad is the pain?"  (e.g., Scale 1-10; mild, moderate, or severe)   - MILD (1-3): Doesn't interfere with normal activities.    - MODERATE (4-7): Interferes with normal activities or awakens from sleep.    - SEVERE (8-10): Excruciating pain, unable to do any normal activities.      Moderate. 4. PATTERN: "Is the pain constant?" (e.g., yes, no; constant, intermittent)      No 5. RADIATION: "Does the pain shoot into your legs or somewhere else?"     No radiation 6. CAUSE:  "What do you think is causing the back pain?"      I pulled a muscle in my lower back.    7. BACK OVERUSE:  "Any recent lifting of heavy objects, strenuous work or exercise?"     Yes see above 8. MEDICINES: "What have you taken so far for the pain?" (e.g., nothing, acetaminophen , NSAIDS)     I  need the 800 mg Motrin .   It works faster 9. NEUROLOGIC SYMPTOMS: "Do you have any weakness, numbness, or problems with bowel/bladder control?"     No 10. OTHER SYMPTOMS: "Do you have any other symptoms?" (e.g., fever, abdomen pain, burning with urination, blood in urine)       No 11. PREGNANCY: "Is there any chance you are pregnant?" "When was your last menstrual period?"       N/A  Protocols used: Back Pain-A-AH

## 2024-04-15 ENCOUNTER — Other Ambulatory Visit: Payer: Self-pay | Admitting: Family Medicine

## 2024-04-15 DIAGNOSIS — M5416 Radiculopathy, lumbar region: Secondary | ICD-10-CM

## 2024-04-15 MED ORDER — IBUPROFEN 800 MG PO TABS
800.0000 mg | ORAL_TABLET | Freq: Three times a day (TID) | ORAL | 0 refills | Status: DC | PRN
Start: 2024-04-15 — End: 2024-07-15

## 2024-04-18 ENCOUNTER — Ambulatory Visit (INDEPENDENT_AMBULATORY_CARE_PROVIDER_SITE_OTHER): Admitting: Family Medicine

## 2024-04-18 ENCOUNTER — Encounter: Payer: Self-pay | Admitting: Family Medicine

## 2024-04-18 VITALS — BP 136/67 | HR 65 | Resp 16 | Ht 73.0 in | Wt 190.3 lb

## 2024-04-18 DIAGNOSIS — M545 Low back pain, unspecified: Secondary | ICD-10-CM | POA: Diagnosis not present

## 2024-04-18 NOTE — Progress Notes (Signed)
 Established patient visit   Patient: Craig York   DOB: Feb 06, 1951   73 y.o. Male  MRN: [id number] Visit Date: 04/18/2024  Today's healthcare provider: Jeralene Mom, MD   Chief Complaint  Patient presents with   Back Pain    Lifting furniture 1 week ago lower back pain   Subjective    Discussed the use of AI scribe software for clinical note transcription with the patient, who gave verbal consent to proceed.  History of Present Illness   Craig York is a 73 year old male who presents with lower back pain after pulling a muscle.  Approximately two weeks ago, he pulled a muscle in his back while lifting an object improperly. The pain is localized to the lower back, specifically over the spine on the left side, and does not radiate to the legs. No shooting pain into the legs or difficulty walking, although he moves around more slowly due to the back pain.  He uses lidocaine  patches and Biofreeze for topical relief. He has been taking oxycodone /apap for pain management and has about 10-12 pills remaining. He also uses ibuprofen  but avoids Motrin  and Advil  due to a history of internal bleeding and a stomach ulcer.  No sleep disturbances due to the pain.      Medications: Outpatient Medications Prior to Visit  Medication Sig   acetaminophen  (TYLENOL ) 500 MG tablet Take 1,000 mg by mouth every 6 (six) hours as needed for moderate pain.   aspirin  EC 81 MG tablet Take 81 mg by mouth daily.   empagliflozin  (JARDIANCE ) 25 MG TABS tablet Take 1 tablet (25 mg total) by mouth daily.   ferrous sulfate  325 (65 FE) MG tablet Take 1 tablet (325 mg total) by mouth 2 (two) times daily with a meal.   fluticasone  (FLONASE ) 50 MCG/ACT nasal spray Place 2 sprays into both nostrils daily.   glipiZIDE  (GLUCOTROL  XL) 5 MG 24 hr tablet Take 1 tablet by mouth once daily with breakfast   ibuprofen  (ADVIL ) 800 MG tablet Take 1 tablet (800 mg total) by mouth every 8 (eight) hours as needed for  moderate pain (pain score 4-6).   losartan  (COZAAR ) 50 MG tablet Take 50 mg by mouth daily.   metFORMIN  (GLUCOPHAGE ) 500 MG tablet Take 2 tablets (1,000 mg total) by mouth 2 (two) times daily with a meal.   metoprolol  succinate (TOPROL -XL) 25 MG 24 hr tablet Take 1 tablet by mouth once daily   oxyCODONE -acetaminophen  (PERCOCET) 10-325 MG tablet Take 1 tablet by mouth every 8 (eight) hours as needed for pain.   pantoprazole  (PROTONIX ) 40 MG tablet Take 1 tablet by mouth once daily   rosuvastatin  (CRESTOR ) 40 MG tablet Take 1 tablet (40 mg total) by mouth daily.   SYSTANE COMPLETE 0.6 % SOLN Place 1 drop into both eyes 3 (three) times daily as needed (dry/irritated eyes.).   tadalafil  (CIALIS ) 20 MG tablet Take 1 tablet (20 mg total) by mouth daily as needed for erectile dysfunction.   No facility-administered medications prior to visit.   Review of Systems  Constitutional:  Negative for appetite change, chills and fever.  Respiratory:  Negative for chest tightness, shortness of breath and wheezing.   Cardiovascular:  Negative for chest pain and palpitations.  Gastrointestinal:  Negative for abdominal pain, nausea and vomiting.       Objective    BP 136/67 (BP Location: Left Arm, Patient Position: Sitting, Cuff Size: Normal)   Pulse 65  Resp 16   Ht 6\' 1"  (1.854 m)   Wt 190 lb 4.8 oz (86.3 kg)   SpO2 97%   BMI 25.11 kg/m   Physical Exam   Mild tenderness over lumbar spine. No para lumbar muscle tenderness. No gross deformities or other lesions.  Assessment & Plan       Muscle strain of lower back Acute muscle strain in lower back, managed with lidocaine  patches, Biofreeze, and oxycodone /apap. No radicular symptoms or ambulation issues. Ibuprofen  used cautiously due to gastrointestinal concerns. - Alternate ice and heat application. - Refill oxycodone /apa prescription when due next week. - Continue lidocaine  patches and Biofreeze.           Jeralene Mom, MD  [facility] [phone] (phone) [phone] (fax)  [facility]

## 2024-04-23 ENCOUNTER — Other Ambulatory Visit: Payer: Self-pay | Admitting: Family Medicine

## 2024-04-23 DIAGNOSIS — R52 Pain, unspecified: Secondary | ICD-10-CM

## 2024-04-23 DIAGNOSIS — M5416 Radiculopathy, lumbar region: Secondary | ICD-10-CM

## 2024-04-23 DIAGNOSIS — M5430 Sciatica, unspecified side: Secondary | ICD-10-CM

## 2024-04-23 MED ORDER — OXYCODONE-ACETAMINOPHEN 10-325 MG PO TABS
1.0000 | ORAL_TABLET | Freq: Three times a day (TID) | ORAL | 0 refills | Status: DC | PRN
Start: 2024-04-23 — End: 2024-05-22

## 2024-05-22 ENCOUNTER — Other Ambulatory Visit: Payer: Self-pay | Admitting: Family Medicine

## 2024-05-22 DIAGNOSIS — M5416 Radiculopathy, lumbar region: Secondary | ICD-10-CM

## 2024-05-22 DIAGNOSIS — M5430 Sciatica, unspecified side: Secondary | ICD-10-CM

## 2024-05-22 DIAGNOSIS — R52 Pain, unspecified: Secondary | ICD-10-CM

## 2024-05-22 MED ORDER — OXYCODONE-ACETAMINOPHEN 10-325 MG PO TABS
1.0000 | ORAL_TABLET | Freq: Three times a day (TID) | ORAL | 0 refills | Status: DC | PRN
Start: 2024-05-22 — End: 2024-06-22

## 2024-06-22 ENCOUNTER — Other Ambulatory Visit: Payer: Self-pay | Admitting: Family Medicine

## 2024-06-22 DIAGNOSIS — R52 Pain, unspecified: Secondary | ICD-10-CM

## 2024-06-22 DIAGNOSIS — M5416 Radiculopathy, lumbar region: Secondary | ICD-10-CM

## 2024-06-22 DIAGNOSIS — M5430 Sciatica, unspecified side: Secondary | ICD-10-CM

## 2024-06-22 MED ORDER — OXYCODONE-ACETAMINOPHEN 10-325 MG PO TABS
1.0000 | ORAL_TABLET | Freq: Three times a day (TID) | ORAL | 0 refills | Status: DC | PRN
Start: 2024-06-22 — End: 2024-07-24

## 2024-07-14 ENCOUNTER — Other Ambulatory Visit: Payer: Self-pay

## 2024-07-14 ENCOUNTER — Observation Stay
Admission: EM | Admit: 2024-07-14 | Discharge: 2024-07-15 | Disposition: A | Discharge status: Home / Self Care | Attending: Student in an Organized Health Care Education/Training Program | Admitting: Student in an Organized Health Care Education/Training Program

## 2024-07-14 DIAGNOSIS — Z87891 Personal history of nicotine dependence: Secondary | ICD-10-CM | POA: Diagnosis not present

## 2024-07-14 DIAGNOSIS — Z7982 Long term (current) use of aspirin: Secondary | ICD-10-CM | POA: Insufficient documentation

## 2024-07-14 DIAGNOSIS — D62 Acute posthemorrhagic anemia: Secondary | ICD-10-CM | POA: Diagnosis not present

## 2024-07-14 DIAGNOSIS — R0602 Shortness of breath: Secondary | ICD-10-CM | POA: Diagnosis not present

## 2024-07-14 DIAGNOSIS — K921 Melena: Secondary | ICD-10-CM | POA: Diagnosis not present

## 2024-07-14 DIAGNOSIS — I2510 Atherosclerotic heart disease of native coronary artery without angina pectoris: Secondary | ICD-10-CM | POA: Diagnosis not present

## 2024-07-14 DIAGNOSIS — K922 Gastrointestinal hemorrhage, unspecified: Secondary | ICD-10-CM | POA: Diagnosis not present

## 2024-07-14 DIAGNOSIS — E119 Type 2 diabetes mellitus without complications: Secondary | ICD-10-CM | POA: Insufficient documentation

## 2024-07-14 DIAGNOSIS — K317 Polyp of stomach and duodenum: Secondary | ICD-10-CM | POA: Insufficient documentation

## 2024-07-14 DIAGNOSIS — I10 Essential (primary) hypertension: Secondary | ICD-10-CM | POA: Insufficient documentation

## 2024-07-14 DIAGNOSIS — D649 Anemia, unspecified: Principal | ICD-10-CM

## 2024-07-14 DIAGNOSIS — K254 Chronic or unspecified gastric ulcer with hemorrhage: Principal | ICD-10-CM | POA: Insufficient documentation

## 2024-07-14 DIAGNOSIS — R531 Weakness: Secondary | ICD-10-CM | POA: Diagnosis not present

## 2024-07-14 DIAGNOSIS — Z01812 Encounter for preprocedural laboratory examination: Secondary | ICD-10-CM | POA: Diagnosis not present

## 2024-07-14 LAB — COMPREHENSIVE METABOLIC PANEL WITH GFR
ALT: 13 U/L (ref 0–44)
AST: 20 U/L (ref 15–41)
Albumin: 3.8 g/dL (ref 3.5–5.0)
Alkaline Phosphatase: 50 U/L (ref 38–126)
Anion gap: 11 (ref 5–15)
BUN: 12 mg/dL (ref 8–23)
CO2: 22 mmol/L (ref 22–32)
Calcium: 9 mg/dL (ref 8.9–10.3)
Chloride: 107 mmol/L (ref 98–111)
Creatinine, Ser: 1.01 mg/dL (ref 0.61–1.24)
GFR, Estimated: >60 mL/min (ref >60)
Glucose, Bld: 194 mg/dL — ABNORMAL HIGH (ref 70–99)
Potassium: 3.8 mmol/L (ref 3.5–5.1)
Sodium: 140 mmol/L (ref 135–145)
Total Bilirubin: 0.7 mg/dL (ref 0.0–1.2)
Total Protein: 7.2 g/dL (ref 6.5–8.1)

## 2024-07-14 LAB — CBC
HCT: 21.4 % — ABNORMAL LOW (ref 39.0–52.0)
Hemoglobin: 5.9 g/dL — ABNORMAL LOW (ref 13.0–17.0)
MCH: 22.4 pg — ABNORMAL LOW (ref 26.0–34.0)
MCHC: 27.6 g/dL — ABNORMAL LOW (ref 30.0–36.0)
MCV: 81.4 fL (ref 80.0–100.0)
Platelets: 237 K/uL (ref 150–400)
RBC: 2.63 MIL/uL — ABNORMAL LOW (ref 4.22–5.81)
RDW: 14.5 % (ref 11.5–15.5)
WBC: 5.1 K/uL (ref 4.0–10.5)
nRBC: 0 % (ref 0.0–0.2)

## 2024-07-14 LAB — GLUCOSE, CAPILLARY: Glucose-Capillary: 116 mg/dL — ABNORMAL HIGH (ref 70–99)

## 2024-07-14 LAB — PREPARE RBC (CROSSMATCH)

## 2024-07-14 LAB — CBG MONITORING, ED: Glucose-Capillary: 187 mg/dL — ABNORMAL HIGH (ref 70–99)

## 2024-07-14 MED ORDER — ALBUTEROL SULFATE (2.5 MG/3ML) 0.083% IN NEBU: 2.5000 mg | INHALATION_SOLUTION | RESPIRATORY_TRACT | Status: DC | PRN

## 2024-07-14 MED ORDER — FERROUS SULFATE 325 (65 FE) MG PO TABS
325.0000 mg | ORAL_TABLET | Freq: Two times a day (BID) | ORAL | Status: DC
  Administered 2024-07-15 (×2): 325 mg via ORAL
  Filled 2024-07-14 (×2): qty 1

## 2024-07-14 MED ORDER — METOPROLOL SUCCINATE ER 25 MG PO TB24
25.0000 mg | ORAL_TABLET | Freq: Every day | ORAL | Status: DC
Start: 2024-07-15 — End: 2024-07-15
  Administered 2024-07-15: 25 mg via ORAL
  Filled 2024-07-14: qty 1

## 2024-07-14 MED ORDER — ROSUVASTATIN CALCIUM 10 MG PO TABS
20.0000 mg | ORAL_TABLET | Freq: Every day | ORAL | Status: DC
  Administered 2024-07-15: 20 mg via ORAL
  Filled 2024-07-14: qty 2

## 2024-07-14 MED ORDER — BISACODYL 5 MG PO TBEC: 5.0000 mg | DELAYED_RELEASE_TABLET | Freq: Every day | ORAL | Status: DC | PRN

## 2024-07-14 MED ORDER — ACETAMINOPHEN 325 MG PO TABS: 650.0000 mg | ORAL_TABLET | Freq: Four times a day (QID) | ORAL | Status: DC | PRN

## 2024-07-14 MED ORDER — PANTOPRAZOLE SODIUM 40 MG IV SOLR
40.0000 mg | Freq: Once | INTRAVENOUS | Status: AC
  Administered 2024-07-14: 40 mg via INTRAVENOUS
  Filled 2024-07-14: qty 10

## 2024-07-14 MED ORDER — ONDANSETRON HCL 4 MG PO TABS: 4.0000 mg | ORAL_TABLET | Freq: Four times a day (QID) | ORAL | Status: DC | PRN

## 2024-07-14 MED ORDER — INSULIN ASPART 100 UNIT/ML IJ SOLN
0.0000 [IU] | Freq: Three times a day (TID) | INTRAMUSCULAR | Status: DC
  Administered 2024-07-15: 1 [IU] via SUBCUTANEOUS
  Filled 2024-07-14: qty 1

## 2024-07-14 MED ORDER — POLYETHYLENE GLYCOL 3350 17 G PO PACK
17.0000 g | PACK | Freq: Every day | ORAL | Status: DC
Start: 2024-07-15 — End: 2024-07-15
  Administered 2024-07-15: 17 g via ORAL
  Filled 2024-07-14: qty 1

## 2024-07-14 MED ORDER — SODIUM CHLORIDE 0.9% IV SOLUTION
Freq: Once | INTRAVENOUS | Status: AC
  Filled 2024-07-14: qty 250

## 2024-07-14 MED ORDER — LOSARTAN POTASSIUM 50 MG PO TABS
50.0000 mg | ORAL_TABLET | Freq: Every day | ORAL | Status: DC
  Administered 2024-07-15: 50 mg via ORAL
  Filled 2024-07-14: qty 1

## 2024-07-14 MED ORDER — ACETAMINOPHEN 650 MG RE SUPP: 650.0000 mg | Freq: Four times a day (QID) | RECTAL | Status: DC | PRN

## 2024-07-14 MED ORDER — ONDANSETRON HCL 4 MG/2ML IJ SOLN: 4.0000 mg | Freq: Four times a day (QID) | INTRAMUSCULAR | Status: DC | PRN

## 2024-07-14 NOTE — ED Triage Notes (Addendum)
 Pt arrives from home via POV with c/o weakness for about 8 days and dark tarry stools for a couple of days. Pt denies changes in appetite. Pt reports getting SOB with exertion. Pt has a Hx of DM. Pt is A&Ox4 and ambulatory in triage.

## 2024-07-14 NOTE — Consult Note (Signed)
 Rogelia Copping, MD [facility]  [address] [place], [place] [address] Phone: [phone] Fax : [phone]  Consultation  Referring Provider:     Dr. Ernest Primary Care Physician:  Gasper Nancyann BRAVO, MD Primary Gastroenterologist: [facility]         Reason for Consultation:     Black stools  Date of Admission:  07/14/2024 Date of Consultation:  07/14/2024         HPI:   Craig York is a 73 y.o. male who has a history of iron deficiency anemia and has had multiple procedures by both May and has also had procedures by Dr. Maryruth.  The patient now comes in with what he reported as black stools for 8 to 10 days prior to coming to the hospital.  His last black stool which was reported to be black and tarry was 2 days ago.  He reports that he has a history of peptic ulcer disease but I did not see any confirmation of that in his previous endoscopy reports.  The patient was noted to feel weak dizzy and was found to have a hemoglobin of 5.9. The patient denies being on any blood thinners but he does report that he takes ibuprofen .  He usually takes 1 ibuprofen  and does not take it on a regular basis.  The patient's blood count has been chronically low and has shown:  Component     Latest Ref Rng 04/18/2023 06/11/2023 06/29/2023 02/15/2024 07/14/2024  Hemoglobin     13.0 - 17.0 g/dL 9.0 (L)  8.2 (L)  8.1 (L)  9.3 (L)  5.9 (L)   Hemoglobin       8.8 (L)      HCT     39.0 - 52.0 % 31.6 (L)  29.1 (L)  29.0 (L)  32.2 (L)  21.4 (L)   HCT       31.6 (L)       I am now being asked to see the patient for possible upper endoscopy for melanotic stools.  Past Medical History:  Diagnosis Date   Anginal pain (HCC)    Benign neoplasm of descending colon    BPH (benign prostatic hyperplasia)    Coronary artery disease    Diabetes mellitus without complication (HCC)    Type II   Hemorrhage of gastrointestinal tract 07/23/2008   Hyperlipidemia    Hypertension    Iron deficiency anemia     Myocardial infarction [facility])    Myocardial infarction involving left anterior descending (LAD) coronary artery (HCC) 12/25/2019   Polyp of sigmoid colon    Symptomatic anemia 04/12/2020    Past Surgical History:  Procedure Laterality Date   CARDIAC CATHETERIZATION     chest surgery for fungal infection     COLONOSCOPY WITH PROPOFOL  N/A 07/10/2017   Procedure: COLONOSCOPY WITH PROPOFOL ;  Surgeon: Copping Rogelia, MD;  Location: [facility] ENDOSCOPY;  Service: Endoscopy;  Laterality: N/A;   COLONOSCOPY WITH PROPOFOL  N/A 04/13/2020   Procedure: COLONOSCOPY WITH PROPOFOL ;  Surgeon: Copping Rogelia, MD;  Location: [facility] ENDOSCOPY;  Service: Endoscopy;  Laterality: N/A;   COLONOSCOPY WITH PROPOFOL  N/A 07/27/2021   Procedure: COLONOSCOPY WITH PROPOFOL ;  Surgeon: Maryruth Ole DASEN, MD;  Location: [facility] ENDOSCOPY;  Service: Endoscopy;  Laterality: N/A;   CORONARY ARTERY BYPASS GRAFT N/A 08/12/2019   Procedure: CORONARY ARTERY BYPASS GRAFTING (CABG)X 4;  Surgeon: German Bartlett PEDLAR, MD;  Location: [facility] OR;  Service: Open Heart Surgery;  Laterality: N/A;  CABG x  4  using bilateral internal mammary arteries and endoscopically harvested left saphenous vein   ESOPHAGOGASTRODUODENOSCOPY N/A 07/26/2021   Procedure: ESOPHAGOGASTRODUODENOSCOPY (EGD);  Surgeon: Maryruth Ole DASEN, MD;  Location: [facility] ENDOSCOPY;  Service: Endoscopy;  Laterality: N/A;   ESOPHAGOGASTRODUODENOSCOPY (EGD) WITH PROPOFOL  N/A 07/10/2017   Procedure: ESOPHAGOGASTRODUODENOSCOPY (EGD) WITH PROPOFOL ;  Surgeon: Jinny Carmine, MD;  Location: [facility] ENDOSCOPY;  Service: Endoscopy;  Laterality: N/A;   ESOPHAGOGASTRODUODENOSCOPY (EGD) WITH PROPOFOL  N/A 04/13/2020   Procedure: ESOPHAGOGASTRODUODENOSCOPY (EGD) WITH PROPOFOL ;  Surgeon: Jinny Carmine, MD;  Location: [facility] ENDOSCOPY;  Service: Endoscopy;  Laterality: N/A;   GIVENS CAPSULE STUDY N/A 04/14/2020   Procedure: GIVENS CAPSULE STUDY;  Surgeon: Therisa Bi, MD;  Location: [facility] ENDOSCOPY;  Service: Gastroenterology;   Laterality: N/A;   HERNIA REPAIR     INTERCOSTAL NERVE BLOCK Right 05/06/2020   Procedure: INTERCOSTAL NERVE BLOCK;  Surgeon: Shyrl Linnie KIDD, MD;  Location: [facility] OR;  Service: Thoracic;  Laterality: Right;   LEFT HEART CATH AND CORONARY ANGIOGRAPHY Left 07/21/2019   Procedure: LEFT HEART CATH AND CORONARY ANGIOGRAPHY;  Surgeon: Florencio Cara BIRCH, MD;  Location: [facility] INVASIVE CV LAB;  Service: Cardiovascular;  Laterality: Left;   TEE WITHOUT CARDIOVERSION N/A 08/12/2019   Procedure: TRANSESOPHAGEAL ECHOCARDIOGRAM (TEE);  Surgeon: German Bartlett PEDLAR, MD;  Location: [facility] OR;  Service: Open Heart Surgery;  Laterality: N/A;    Prior to Admission medications   Medication Sig Start Date End Date Taking? Authorizing Provider  acetaminophen  (TYLENOL ) 500 MG tablet Take 1,000 mg by mouth every 6 (six) hours as needed for moderate pain.    [provider]  aspirin  EC 81 MG tablet Take 81 mg by mouth daily.    [provider]  empagliflozin  (JARDIANCE ) 25 MG TABS tablet Take 1 tablet (25 mg total) by mouth daily. 10/01/23   Gasper Nancyann BRAVO, MD  ferrous sulfate  325 (65 FE) MG tablet Take 1 tablet (325 mg total) by mouth 2 (two) times daily with a meal. 04/14/20   Laurita Pillion, MD  fluticasone  (FLONASE ) 50 MCG/ACT nasal spray Place 2 sprays into both nostrils daily. 06/29/23   Gasper Nancyann BRAVO, MD  glipiZIDE  (GLUCOTROL  XL) 5 MG 24 hr tablet Take 1 tablet by mouth once daily with breakfast 02/22/24   Gasper Nancyann BRAVO, MD  ibuprofen  (ADVIL ) 800 MG tablet Take 1 tablet (800 mg total) by mouth every 8 (eight) hours as needed for moderate pain (pain score 4-6). 04/15/24   Gasper Nancyann BRAVO, MD  losartan  (COZAAR ) 50 MG tablet Take 50 mg by mouth daily.    Callwood, Dwayne D, MD  metFORMIN  (GLUCOPHAGE ) 500 MG tablet Take 2 tablets (1,000 mg total) by mouth 2 (two) times daily with a meal. 02/25/24   Gasper Nancyann BRAVO, MD  metoprolol  succinate (TOPROL -XL) 25 MG 24 hr tablet Take 1 tablet by mouth once daily  07/07/22   Gasper Nancyann BRAVO, MD  oxyCODONE -acetaminophen  (PERCOCET) 10-325 MG tablet Take 1 tablet by mouth every 8 (eight) hours as needed for pain. 06/22/24   Gasper Nancyann BRAVO, MD  pantoprazole  (PROTONIX ) 40 MG tablet Take 1 tablet by mouth once daily 04/12/22   Gasper Nancyann BRAVO, MD  rosuvastatin  (CRESTOR ) 40 MG tablet Take 1 tablet (40 mg total) by mouth daily. 01/16/23   Gasper Nancyann BRAVO, MD  SYSTANE COMPLETE 0.6 % SOLN Place 1 drop into both eyes 3 (three) times daily as needed (dry/irritated eyes.).    [provider]  tadalafil  (CIALIS ) 20 MG tablet Take 1  tablet (20 mg total) by mouth daily as needed for erectile dysfunction. 02/15/24   Gasper Nancyann BRAVO, MD    Family History  Problem Relation Age of Onset   Hypertension Mother      Social History   Tobacco Use   Smoking status: Former   Smokeless tobacco: Former    Types: Chew    Quit date: 11/27/1968   Tobacco comments:    used 2 packs per week; quit over 40 years ago  Vaping Use   Vaping status: Never Used  Substance Use Topics   Alcohol  use: Not Currently    Alcohol /week: 0.0 standard drinks of alcohol    Drug use: No    Allergies as of 07/14/2024 - Review Complete 07/14/2024  Allergen Reaction Noted   Simvastatin  06/04/2015   Penicillins Rash 06/04/2015    Review of Systems:    All systems reviewed and negative except where noted in HPI.   Physical Exam:  Vital signs in last 24 hours: Temp:  [98.6 F (37 C)-98.7 F (37.1 C)] 98.6 F (37 C) (08/18 1211) Pulse Rate:  [69-95] 69 (08/18 1211) Resp:  [16-18] 18 (08/18 1211) BP: (127-131)/(68-72) 131/68 (08/18 1211) SpO2:  [100 %] 100 % (08/18 1211) Weight:  [85.3 kg] 85.3 kg (08/18 1025)   General:   Pleasant, cooperative in NAD Head:  Normocephalic and atraumatic. Eyes:   No icterus.   Conjunctiva pink. PERRLA. Ears:  Normal auditory acuity. Neck:  Supple; no masses or thyroidomegaly Lungs: Respirations even and unlabored. Lungs clear to auscultation  bilaterally.   No wheezes, crackles, or rhonchi.  Heart:  Regular rate and rhythm;  Without murmur, clicks, rubs or gallops Abdomen:  Soft, nondistended, nontender. Normal bowel sounds. No appreciable masses or hepatomegaly.  No rebound or guarding.  Rectal:  Not performed. Msk:  Symmetrical without gross deformities.    Extremities:  Without edema, cyanosis or clubbing. Neurologic:  Alert and oriented x3;  grossly normal neurologically. Skin:  Intact without significant lesions or rashes. Cervical Nodes:  No significant cervical adenopathy. Psych:  Alert and cooperative. Normal affect.  LAB RESULTS: Recent Labs    07/14/24 1022  WBC 5.1  HGB 5.9*  HCT 21.4*  PLT 237   BMET Recent Labs    07/14/24 1022  NA 140  K 3.8  CL 107  CO2 22  GLUCOSE 194*  BUN 12  CREATININE 1.01  CALCIUM  9.0   LFT Recent Labs    07/14/24 1022  PROT 7.2  ALBUMIN  3.8  AST 20  ALT 13  ALKPHOS 50  BILITOT 0.7   PT/INR No results for input(s): LABPROT, INR in the last 72 hours.  STUDIES: No results found.    Impression / Plan:   Assessment: Principal Problem:   GI bleeding Active Problems:   Melena   Upper GI bleed   Symptomatic anemia   Craig York is a 73 y.o. y/o male with a hemoglobin of 5.9 and admission with melena that lasted reported 10 days with his last black stools 2 days ago.  He has no abdominal pain at the present time and does take ibuprofen  as needed.  Plan:  The patient will need to be transfused prior to any endoscopic procedure and the patient does not appear to be actively bleeding.  The patient will be set up for an EGD for tomorrow.  He has been explained the procedure risks benefits and alternatives and agrees to proceed with the upper endoscopy.  PPI IV twice  daily  Continue serial CBCs and transfuse PRN Avoid NSAIDs Maintain 2 large-bore IV lines Please page GI with any acute hemodynamic changes, or signs of active GI bleeding   Thank you  for involving me in the care of this patient.      LOS: 0 days   Rogelia Copping, MD, MD. NOLIA 07/14/2024, 12:41 PM,  Pager [phone] 7am-5pm  Check AMION for 5pm -7am coverage and on weekends   Note: This dictation was prepared with Dragon dictation along with smaller phrase technology. Any transcriptional errors that result from this process are unintentional.

## 2024-07-14 NOTE — ED Provider Notes (Signed)
 [facility] Provider Note    Event Date/Time   First MD Initiated Contact with Patient 07/14/24 1106     (approximate)   History   Weakness, Melena, and Dizziness   HPI  Craig York is a 73 y.o. male who comes in with concerns of weakness and dark stools over the past 8 days.  Reports that over the past few days has become dark and tarry.  He denies any changes in appetite.  Does report a little bit of shortness of breath with exertion.  Does report history of diabetes.  I reviewed patient's blood work and I do not see that he is on any blood thinners.  Physical Exam   Triage Vital Signs: ED Triage Vitals  Encounter Vitals Group     BP 07/14/24 1023 128/72     Girls Systolic BP Percentile --      Girls Diastolic BP Percentile --      Boys Systolic BP Percentile --      Boys Diastolic BP Percentile --      Pulse Rate 07/14/24 1023 95     Resp 07/14/24 1023 16     Temp 07/14/24 1023 98.7 F (37.1 C)     Temp Source 07/14/24 1023 Oral     SpO2 07/14/24 1023 100 %     Weight 07/14/24 1025 188 lb (85.3 kg)     Height 07/14/24 1025 6' 1 (1.854 m)     Head Circumference --      Peak Flow --      Pain Score 07/14/24 1024 0     Pain Loc --      Pain Education --      Exclude from Growth Chart --     Most recent vital signs: Vitals:   07/14/24 1023  BP: 128/72  Pulse: 95  Resp: 16  Temp: 98.7 F (37.1 C)  SpO2: 100%     General: Awake, no distress.  CV:  Good peripheral perfusion.  Resp:  Normal effort.  Abd:  No distention.  Soft nontender Other:     ED Results / Procedures / Treatments   Labs (all labs ordered are listed, but only abnormal results are displayed) Labs Reviewed  COMPREHENSIVE METABOLIC PANEL WITH GFR - Abnormal; Notable for the following components:      Result Value   Glucose, Bld 194 (*)    All other components within normal limits  CBC - Abnormal; Notable for the following components:   RBC 2.63 (*)     Hemoglobin 5.9 (*)    HCT 21.4 (*)    MCH 22.4 (*)    MCHC 27.6 (*)    All other components within normal limits  CBG MONITORING, ED - Abnormal; Notable for the following components:   Glucose-Capillary 187 (*)    All other components within normal limits  POC OCCULT BLOOD, ED  TYPE AND SCREEN     EKG  My interpretation of EKG:  Normal sinus rhythm 94 without any ST elevation does have some T wave inversions in lead II, aVF, V5 and V6.    RADIOLOGY    PROCEDURES:  Critical Care performed: Yes, see critical care procedure note(s)  .1-3 Lead EKG Interpretation  Performed by: Ernest Ronal BRAVO, MD Authorized by: Ernest Ronal BRAVO, MD     Interpretation: normal     ECG rate:  95   ECG rate assessment: normal     Rhythm: sinus rhythm  Ectopy: none     Conduction: normal   .Critical Care  Performed by: Ernest Ronal BRAVO, MD Authorized by: Ernest Ronal BRAVO, MD   Critical care provider statement:    Critical care time (minutes):  30   Critical care was necessary to treat or prevent imminent or life-threatening deterioration of the following conditions: gi bleed.   Critical care was time spent personally by me on the following activities:  Development of treatment plan with patient or surrogate, discussions with consultants, evaluation of patient's response to treatment, examination of patient, ordering and review of laboratory studies, ordering and review of radiographic studies, ordering and performing treatments and interventions, pulse oximetry, re-evaluation of patient's condition and review of old charts    MEDICATIONS ORDERED IN ED: Medications  0.9 %  sodium chloride  infusion (Manually program via Guardrails IV Fluids) (has no administration in time range)  pantoprazole  (PROTONIX ) injection 40 mg (has no administration in time range)     IMPRESSION / MDM / ASSESSMENT AND PLAN / ED COURSE  I reviewed the triage vital signs and the nursing notes.   Patient's  presentation is most consistent with acute presentation with potential threat to life or bodily function.   Patient comes in with concerns for GI bleed.  Blood work was ordered to evaluate and patient is anemic consistent with concern for upper GI bleed.  Does report a low bit of shortness of breath with exertion which I suspect is related to patient's anemia.  EKG without STEMI.  Does have some T wave inversions with which I suspect is demand secondary to patient's anemia.  No overt chest pain.  Will start patient on Protonix .  He denies any alcohol  or liver issues   CBC shows a hemoglobin of 5.9.  CMP reassuring.  Glucose normal  Given patient's history of CABG will you write for 2 units of blood.  Discussed the case with Dr. Jinny from GI.  Holding off on octreotide given no history of cirrhosis, alcohol  use.  Patient ordered for some Protonix .  I will discuss with hospital team for admission due to concerns for upper GI bleed.  I did review some prior notes for patient did have a history of gastric ulcer.  He does report occasionally using ibuprofen  800s.  Denies it being every day.  He denies any other blood thinners.    The patient is on the cardiac monitor to evaluate for evidence of arrhythmia and/or significant heart rate changes.      FINAL CLINICAL IMPRESSION(S) / ED DIAGNOSES   Final diagnoses:  Symptomatic anemia  Upper GI bleed  Melena     Rx / DC Orders   ED Discharge Orders     None        Note:  This document was prepared using Dragon voice recognition software and may include unintentional dictation errors.   Ernest Ronal BRAVO, MD 07/14/24 [phone]

## 2024-07-14 NOTE — H&P (Signed)
 History and Physical  Craig AGRESTA FMW:[id number] DOB: 1951-11-22 DOA: 07/14/2024 PCP: Gasper Nancyann BRAVO, MD  Chief Complaint: gi bleed Historian: patient  HPI:  Craig York is a 73 y.o. male with a PMH significant for MI, anemia, gastric polyp, CABG, type II DM. At baseline, they live independently and are ambulatory and independent with ADLs.  They presented from home to the ED on 07/14/2024 with shortness of breath progressively worsening  x about 10 days. He states that for about 10 days he has noticed having dark stools with intermittent dark redness. No abdominal pain.  He normally walks 1-1.5 miles twice a week but now gets short of breath just walking short distances. He denies chest pain or SOB at rest.  Denies blood thinners or history of blood thinner.  He takes occasional ibuprofen  for headaches and last time was about a week ago.  He presented today because he became so weak today had trouble standing.   In the ED, it was found that they had stable vital signs.  Significant findings included: initial hgb 5.9 (last hgb was 9.3 5 months ago) otherwise mostly unremarkable.  No active disease on chest xray.   They were initially treated with 2 units of pRBCs.   Patient was admitted to medicine service for further workup and management of GI bleed as outlined in detail below.  Assessment/Plan Principal Problem:   GI bleeding Active Problems:   Melena   Upper GI bleed   Symptomatic anemia   GI bleed- suspect upper in setting of NSAID use.  Acute blood loss anemia on chronic iron deficiency anemia- hgb 5.9 on presentation.  - GI consulted. Planning EGD tomorrow - avoid NSAIDs - 2u pRBCs ordered and running currently. H&H when completed and CBC am - hold home ASA - IV protonix  - continue home iron supplement  Type II DM- jardiance , metformin  and glipizide  at home - sliding scale - continue rosuvastatin   HTN-  - continue home losartan , metoprolol    Past  Medical History:  Diagnosis Date   Anginal pain (HCC)    Benign neoplasm of descending colon    BPH (benign prostatic hyperplasia)    Coronary artery disease    Diabetes mellitus without complication (HCC)    Type II   Hemorrhage of gastrointestinal tract 07/23/2008   Hyperlipidemia    Hypertension    Iron deficiency anemia    Myocardial infarction [facility])    Myocardial infarction involving left anterior descending (LAD) coronary artery (HCC) 12/25/2019   Polyp of sigmoid colon    Symptomatic anemia 04/12/2020    Past Surgical History:  Procedure Laterality Date   CARDIAC CATHETERIZATION     chest surgery for fungal infection     COLONOSCOPY WITH PROPOFOL  N/A 07/10/2017   Procedure: COLONOSCOPY WITH PROPOFOL ;  Surgeon: Jinny Carmine, MD;  Location: [facility] ENDOSCOPY;  Service: Endoscopy;  Laterality: N/A;   COLONOSCOPY WITH PROPOFOL  N/A 04/13/2020   Procedure: COLONOSCOPY WITH PROPOFOL ;  Surgeon: Jinny Carmine, MD;  Location: [facility] ENDOSCOPY;  Service: Endoscopy;  Laterality: N/A;   COLONOSCOPY WITH PROPOFOL  N/A 07/27/2021   Procedure: COLONOSCOPY WITH PROPOFOL ;  Surgeon: Maryruth Ole DASEN, MD;  Location: [facility] ENDOSCOPY;  Service: Endoscopy;  Laterality: N/A;   CORONARY ARTERY BYPASS GRAFT N/A 08/12/2019   Procedure: CORONARY ARTERY BYPASS GRAFTING (CABG)X 4;  Surgeon: German Bartlett PEDLAR, MD;  Location: [facility] OR;  Service: Open Heart Surgery;  Laterality: N/A;  CABG x  4  using bilateral internal mammary arteries and endoscopically harvested  left saphenous vein   ESOPHAGOGASTRODUODENOSCOPY N/A 07/26/2021   Procedure: ESOPHAGOGASTRODUODENOSCOPY (EGD);  Surgeon: Maryruth Ole DASEN, MD;  Location: [facility] ENDOSCOPY;  Service: Endoscopy;  Laterality: N/A;   ESOPHAGOGASTRODUODENOSCOPY (EGD) WITH PROPOFOL  N/A 07/10/2017   Procedure: ESOPHAGOGASTRODUODENOSCOPY (EGD) WITH PROPOFOL ;  Surgeon: Jinny Carmine, MD;  Location: [facility] ENDOSCOPY;  Service: Endoscopy;  Laterality: N/A;   ESOPHAGOGASTRODUODENOSCOPY (EGD)  WITH PROPOFOL  N/A 04/13/2020   Procedure: ESOPHAGOGASTRODUODENOSCOPY (EGD) WITH PROPOFOL ;  Surgeon: Jinny Carmine, MD;  Location: [facility] ENDOSCOPY;  Service: Endoscopy;  Laterality: N/A;   GIVENS CAPSULE STUDY N/A 04/14/2020   Procedure: GIVENS CAPSULE STUDY;  Surgeon: Therisa Bi, MD;  Location: [facility] ENDOSCOPY;  Service: Gastroenterology;  Laterality: N/A;   HERNIA REPAIR     INTERCOSTAL NERVE BLOCK Right 05/06/2020   Procedure: INTERCOSTAL NERVE BLOCK;  Surgeon: Shyrl Linnie KIDD, MD;  Location: [facility] OR;  Service: Thoracic;  Laterality: Right;   LEFT HEART CATH AND CORONARY ANGIOGRAPHY Left 07/21/2019   Procedure: LEFT HEART CATH AND CORONARY ANGIOGRAPHY;  Surgeon: Florencio Cara BIRCH, MD;  Location: [facility] INVASIVE CV LAB;  Service: Cardiovascular;  Laterality: Left;   TEE WITHOUT CARDIOVERSION N/A 08/12/2019   Procedure: TRANSESOPHAGEAL ECHOCARDIOGRAM (TEE);  Surgeon: German Bartlett PEDLAR, MD;  Location: [facility] OR;  Service: Open Heart Surgery;  Laterality: N/A;     reports that he has quit smoking. He quit smokeless tobacco use about 55 years ago.  His smokeless tobacco use included chew. He reports that he does not currently use alcohol . He reports that he does not use drugs.  Allergies  Allergen Reactions   Simvastatin     Elevated CK   Penicillins Rash    Did it involve swelling of the face/tongue/throat, SOB, or low BP? No Did it involve sudden or severe rash/hives, skin peeling, or any reaction on the inside of your mouth or nose? No Did you need to seek medical attention at a hospital or doctor's office? No When did it last happen?       If all above answers are "NO", may proceed with cephalosporin use.     Family History  Problem Relation Age of Onset   Hypertension Mother     Prior to Admission medications   Medication Sig Start Date End Date Taking? Authorizing Provider  acetaminophen  (TYLENOL ) 500 MG tablet Take 1,000 mg by mouth every 6 (six) hours as needed for moderate pain.     [provider]  aspirin  EC 81 MG tablet Take 81 mg by mouth daily.    [provider]  empagliflozin  (JARDIANCE ) 25 MG TABS tablet Take 1 tablet (25 mg total) by mouth daily. 10/01/23   Gasper Nancyann BRAVO, MD  ferrous sulfate  325 (65 FE) MG tablet Take 1 tablet (325 mg total) by mouth 2 (two) times daily with a meal. 04/14/20   Laurita Pillion, MD  fluticasone  (FLONASE ) 50 MCG/ACT nasal spray Place 2 sprays into both nostrils daily. 06/29/23   Gasper Nancyann BRAVO, MD  glipiZIDE  (GLUCOTROL  XL) 5 MG 24 hr tablet Take 1 tablet by mouth once daily with breakfast 02/22/24   Gasper Nancyann BRAVO, MD  ibuprofen  (ADVIL ) 800 MG tablet Take 1 tablet (800 mg total) by mouth every 8 (eight) hours as needed for moderate pain (pain score 4-6). 04/15/24   Gasper Nancyann BRAVO, MD  losartan  (COZAAR ) 50 MG tablet Take 50 mg by mouth daily.    Callwood, Dwayne D, MD  metFORMIN  (GLUCOPHAGE ) 500 MG tablet Take 2 tablets (1,000 mg total) by mouth 2 (two)  times daily with a meal. 02/25/24   Gasper Nancyann BRAVO, MD  metoprolol  succinate (TOPROL -XL) 25 MG 24 hr tablet Take 1 tablet by mouth once daily 07/07/22   Gasper Nancyann BRAVO, MD  oxyCODONE -acetaminophen  (PERCOCET) 10-325 MG tablet Take 1 tablet by mouth every 8 (eight) hours as needed for pain. 06/22/24   Gasper Nancyann BRAVO, MD  pantoprazole  (PROTONIX ) 40 MG tablet Take 1 tablet by mouth once daily 04/12/22   Gasper Nancyann BRAVO, MD  rosuvastatin  (CRESTOR ) 40 MG tablet Take 1 tablet (40 mg total) by mouth daily. 01/16/23   Gasper Nancyann BRAVO, MD  SYSTANE COMPLETE 0.6 % SOLN Place 1 drop into both eyes 3 (three) times daily as needed (dry/irritated eyes.).    [provider]  tadalafil  (CIALIS ) 20 MG tablet Take 1 tablet (20 mg total) by mouth daily as needed for erectile dysfunction. 02/15/24   Gasper Nancyann BRAVO, MD   I have personally, briefly reviewed patient's prior medical records in [facility]  Objective: Blood pressure 128/72, pulse 95, temperature 98.7 F (37.1  C), temperature source Oral, resp. rate 16, height 6' 1 (1.854 m), weight 85.3 kg, SpO2 100%.   Constitutional: NAD, calm, comfortable HEENT: lids and conjunctivae normal. MMM. Posterior pharynx clear of any exudate or lesions. Normal dentition.  Neck: normal, supple, no masses, no thyromegaly Respiratory: CTAB, no wheezing, no crackles. Normal respiratory effort. No accessory muscle use.  Cardiovascular: RRR, no murmurs / rubs / gallops. No extremity edema. 2+ pedal pulses. no clubbing / cyanosis.  Abdomen: soft, NT, ND, no masses or HSM palpated. Musculoskeletal: No joint deformity upper and lower extremities. Normal muscle tone.  Skin: dry, intact, normal color, normal temperature on exposed skin Neurologic: Alert and oriented x 3. Normal speech. Grossly non-focal exam. PERRL Psychiatric: Normal mood. Congruent affect.  Labs on Admission: I have personally reviewed admission labs and imaging studies  CBC    Component Value Date/Time   WBC 5.1 07/14/2024 1022   RBC 2.63 (L) 07/14/2024 1022   HGB 5.9 (L) 07/14/2024 1022   HGB 9.3 (L) 02/15/2024 1117   HCT 21.4 (L) 07/14/2024 1022   HCT 32.2 (L) 02/15/2024 1117   PLT 237 07/14/2024 1022   PLT 251 06/29/2023 1035   MCV 81.4 07/14/2024 1022   MCV 82 06/29/2023 1035   MCH 22.4 (L) 07/14/2024 1022   MCHC 27.6 (L) 07/14/2024 1022   RDW 14.5 07/14/2024 1022   RDW 14.9 06/29/2023 1035   LYMPHSABS 0.8 06/29/2023 1035   MONOABS 0.3 06/11/2023 1512   EOSABS 0.1 06/29/2023 1035   BASOSABS 0.1 06/29/2023 1035   CMP     Component Value Date/Time   NA 140 07/14/2024 1022   NA 138 02/15/2024 1117   K 3.8 07/14/2024 1022   CL 107 07/14/2024 1022   CO2 22 07/14/2024 1022   GLUCOSE 194 (H) 07/14/2024 1022   BUN 12 07/14/2024 1022   BUN 15 02/15/2024 1117   CREATININE 1.01 07/14/2024 1022   CALCIUM  9.0 07/14/2024 1022   PROT 7.2 07/14/2024 1022   PROT 7.2 02/15/2024 1117   ALBUMIN  3.8 07/14/2024 1022   ALBUMIN  4.9 (H) 02/15/2024  1117   AST 20 07/14/2024 1022   ALT 13 07/14/2024 1022   ALKPHOS 50 07/14/2024 1022   BILITOT 0.7 07/14/2024 1022   BILITOT 0.5 02/15/2024 1117   GFRNONAA >60 07/14/2024 1022   GFRAA >60 05/08/2020 0240    Radiological Exams on Admission: No results found.  EKG: Independently reviewed.  NSR  DVT prophylaxis: SCDs Start: 07/14/24 1154   Code Status: full  Family Communication: none at bedside   Disposition Plan: admit to obs  Consults called: GI   Marien LITTIE Piety, DO [facility]  07/14/2024, 11:55 AM    To contact the appropriate [facility] Attending or Consulting provider: Check [email or web address] for coverage from 7pm-7am

## 2024-07-15 ENCOUNTER — Observation Stay: Payer: Self-pay

## 2024-07-15 ENCOUNTER — Encounter
Admission: EM | Disposition: A | Discharge status: Home / Self Care | Payer: Self-pay | Source: Home / Self Care | Attending: Emergency Medicine

## 2024-07-15 ENCOUNTER — Encounter: Payer: Self-pay | Admitting: Gastroenterology

## 2024-07-15 DIAGNOSIS — I10 Essential (primary) hypertension: Secondary | ICD-10-CM | POA: Diagnosis not present

## 2024-07-15 DIAGNOSIS — K317 Polyp of stomach and duodenum: Secondary | ICD-10-CM | POA: Diagnosis not present

## 2024-07-15 DIAGNOSIS — K259 Gastric ulcer, unspecified as acute or chronic, without hemorrhage or perforation: Secondary | ICD-10-CM | POA: Diagnosis not present

## 2024-07-15 DIAGNOSIS — D649 Anemia, unspecified: Secondary | ICD-10-CM | POA: Diagnosis not present

## 2024-07-15 DIAGNOSIS — K922 Gastrointestinal hemorrhage, unspecified: Secondary | ICD-10-CM | POA: Diagnosis not present

## 2024-07-15 DIAGNOSIS — K921 Melena: Secondary | ICD-10-CM | POA: Diagnosis not present

## 2024-07-15 DIAGNOSIS — Z87891 Personal history of nicotine dependence: Secondary | ICD-10-CM | POA: Diagnosis not present

## 2024-07-15 HISTORY — PX: ESOPHAGOGASTRODUODENOSCOPY: SHX5428

## 2024-07-15 LAB — CBC
HCT: 24.6 % — ABNORMAL LOW (ref 39.0–52.0)
Hemoglobin: 7.3 g/dL — ABNORMAL LOW (ref 13.0–17.0)
MCH: 24 pg — ABNORMAL LOW (ref 26.0–34.0)
MCHC: 29.7 g/dL — ABNORMAL LOW (ref 30.0–36.0)
MCV: 80.9 fL (ref 80.0–100.0)
Platelets: 195 K/uL (ref 150–400)
RBC: 3.04 MIL/uL — ABNORMAL LOW (ref 4.22–5.81)
RDW: 14.2 % (ref 11.5–15.5)
WBC: 4.4 K/uL (ref 4.0–10.5)
nRBC: 0 % (ref 0.0–0.2)

## 2024-07-15 LAB — PREPARE RBC (CROSSMATCH)

## 2024-07-15 LAB — GLUCOSE, CAPILLARY
Glucose-Capillary: 142 mg/dL — ABNORMAL HIGH (ref 70–99)
Glucose-Capillary: 114 mg/dL — ABNORMAL HIGH (ref 70–99)
Glucose-Capillary: 140 mg/dL — ABNORMAL HIGH (ref 70–99)

## 2024-07-15 LAB — HEMOGLOBIN AND HEMATOCRIT, BLOOD
HCT: 31 % — ABNORMAL LOW (ref 39.0–52.0)
Hemoglobin: 9.3 g/dL — ABNORMAL LOW (ref 13.0–17.0)

## 2024-07-15 SURGERY — EGD (ESOPHAGOGASTRODUODENOSCOPY)
Anesthesia: General

## 2024-07-15 MED ORDER — PROPOFOL 10 MG/ML IV BOLUS
INTRAVENOUS | Status: DC | PRN
  Administered 2024-07-15: 50 mg via INTRAVENOUS
  Administered 2024-07-15: 80 mg via INTRAVENOUS
  Administered 2024-07-15: 40 mg via INTRAVENOUS

## 2024-07-15 MED ORDER — LIDOCAINE HCL (CARDIAC) PF 100 MG/5ML IV SOSY
PREFILLED_SYRINGE | INTRAVENOUS | Status: DC | PRN
  Administered 2024-07-15: 100 mg via INTRAVENOUS

## 2024-07-15 MED ORDER — SODIUM CHLORIDE 0.9 % IV SOLN: INTRAVENOUS | Status: DC | PRN

## 2024-07-15 MED ORDER — SODIUM CHLORIDE 0.9% IV SOLUTION: Freq: Once | INTRAVENOUS | Status: AC

## 2024-07-15 MED ORDER — PANTOPRAZOLE SODIUM 40 MG IV SOLR
40.0000 mg | Freq: Once | INTRAVENOUS | Status: AC
  Administered 2024-07-15: 40 mg via INTRAVENOUS
  Filled 2024-07-15: qty 10

## 2024-07-15 MED ORDER — GLYCOPYRROLATE 0.2 MG/ML IJ SOLN
INTRAMUSCULAR | Status: DC | PRN
  Administered 2024-07-15: .2 mg via INTRAVENOUS

## 2024-07-15 NOTE — Discharge Summary (Signed)
 Physician Discharge Summary  Patient: Craig York FMW:[id number] DOB: 1951/05/27   Code Status: Full Code Admit date: 07/14/2024 Discharge date: 07/15/2024 Disposition: Home, No home health services recommended PCP: Gasper Nancyann BRAVO, MD  Recommendations for Outpatient Follow-up:  Follow up with PCP within 1-2 weeks Regarding general hospital follow up and preventative care Recommend CBC Follow up with GI to discuss biopsies from EGD  Discharge Diagnoses:  Principal Problem:   GI bleeding Active Problems:   Melena   Upper GI bleed   Symptomatic anemia  Brief Hospital Course Summary: Craig York is a 73 y.o. male with a PMH significant for MI, anemia, gastric polyp, CABG, type II DM. At baseline, they live independently and are ambulatory and independent with ADLs.   They presented from home to the ED on 07/14/2024 with shortness of breath progressively worsening x about 10 days. He states that for about 10 days he has noticed having dark stools with intermittent dark redness. No abdominal pain.  He normally walks 1-1.5 miles twice a week but now gets short of breath just walking short distances. He denies chest pain or SOB at rest.  He takes occasional ibuprofen  for headaches and last time was about a week ago.    In the ED, it was found that they had stable vital signs.  Significant findings included: initial hgb 5.9 (last hgb was 9.3 5 months ago) otherwise mostly unremarkable.  No active disease on chest xray.    They were initially treated with 2 units of pRBCs. He was given a third unit of blood the next day to meet goal >8 hgb. GI was consulted and performed EGD. He had no active bleeding but found multiple polyps which were biopsied and will follow up outpatient.  His hgb was 9.3 on day of discharge following his third unit of blood transfused.    He was recommended to hold his aspirin  for now and stop any NSAID use.   All other chronic conditions were treated  with home medications.    Discharge Condition: Stable, improved Recommended discharge diet: Regular healthy diet  Consultations: GI  Procedures/Studies: EGD  Allergies as of 07/15/2024       Reactions   Simvastatin    Elevated CK   Penicillins Rash   Did it involve swelling of the face/tongue/throat, SOB, or low BP? No Did it involve sudden or severe rash/hives, skin peeling, or any reaction on the inside of your mouth or nose? No Did you need to seek medical attention at a hospital or doctor's office? No When did it last happen?       If all above answers are "NO", may proceed with cephalosporin use.        Medication List     STOP taking these medications    aspirin  EC 81 MG tablet   ibuprofen  800 MG tablet Commonly known as: ADVIL        TAKE these medications    acetaminophen  500 MG tablet Commonly known as: TYLENOL  Take 1,000 mg by mouth every 6 (six) hours as needed for moderate pain.   empagliflozin  25 MG Tabs tablet Commonly known as: Jardiance  Take 1 tablet (25 mg total) by mouth daily.   ferrous sulfate  325 (65 FE) MG tablet Take 1 tablet (325 mg total) by mouth 2 (two) times daily with a meal.   fluticasone  50 MCG/ACT nasal spray Commonly known as: FLONASE  Place 2 sprays into both nostrils daily.   glipiZIDE  5 MG 24  hr tablet Commonly known as: GLUCOTROL  XL Take 1 tablet by mouth once daily with breakfast   losartan  50 MG tablet Commonly known as: COZAAR  Take 50 mg by mouth daily.   metFORMIN  500 MG tablet Commonly known as: GLUCOPHAGE  Take 2 tablets (1,000 mg total) by mouth 2 (two) times daily with a meal.   metoprolol  succinate 25 MG 24 hr tablet Commonly known as: TOPROL -XL Take 1 tablet by mouth once daily   oxyCODONE -acetaminophen  10-325 MG tablet Commonly known as: PERCOCET Take 1 tablet by mouth every 8 (eight) hours as needed for pain.   pantoprazole  40 MG tablet Commonly known as: PROTONIX  Take 1 tablet by mouth once  daily   rosuvastatin  20 MG tablet Commonly known as: CRESTOR  Take 20 mg by mouth daily.   Systane Complete 0.6 % Soln Generic drug: Propylene Glycol Place 1 drop into both eyes 3 (three) times daily as needed (dry/irritated eyes.).   tadalafil  20 MG tablet Commonly known as: Cialis  Take 1 tablet (20 mg total) by mouth daily as needed for erectile dysfunction.        Follow-up Information     Gasper Nancyann BRAVO, MD. Schedule an appointment as soon as possible for a visit in 1 week(s).   Specialty: Family Medicine Contact information: [address] [place] [address] [place] [address] [phone]                 Subjective   Pt reports feeling tired. Denies abdominal pain. Denies blood in stool. No nausea or vomiting.   All questions and concerns were addressed at time of discharge.  Objective  Blood pressure 132/70, pulse 94, temperature 98 F (36.7 C), temperature source Oral, resp. rate 18, height 6' 1 (1.854 m), weight 85 kg, SpO2 98%.   General: Pt is alert, awake, not in acute distress Cardiovascular: RRR, S1/S2 +, no rubs, no gallops Respiratory: CTA bilaterally, no wheezing, no rhonchi Abdominal: Soft, NT, ND, bowel sounds + Extremities: no edema, no cyanosis  The results of significant diagnostics from this hospitalization (including imaging, microbiology, ancillary and laboratory) are listed below for reference.   Imaging studies: No results found.  Labs: Basic Metabolic Panel: Recent Labs  Lab 07/14/24 1022  NA 140  K 3.8  CL 107  CO2 22  GLUCOSE 194*  BUN 12  CREATININE 1.01  CALCIUM  9.0   CBC: Recent Labs  Lab 07/14/24 1022 07/15/24 0528 07/15/24 1449  WBC 5.1 4.4  --   HGB 5.9* 7.3* 9.3*  HCT 21.4* 24.6* 31.0*  MCV 81.4 80.9  --   PLT 237 195  --    Microbiology: Results for orders placed or performed during the hospital encounter of 07/24/21  Resp Panel by RT-PCR (Flu A&B, Covid) Nasopharyngeal Swab     Status: None    Collection Time: 07/24/21  2:40 PM   Specimen: Nasopharyngeal Swab; Nasopharyngeal(NP) swabs in vial transport medium  Result Value Ref Range Status   SARS Coronavirus 2 by RT PCR NEGATIVE NEGATIVE Final    Comment: (NOTE) SARS-CoV-2 target nucleic acids are NOT DETECTED.  The SARS-CoV-2 RNA is generally detectable in upper respiratory specimens during the acute phase of infection. The lowest concentration of SARS-CoV-2 viral copies this assay can detect is 138 copies/mL. A negative result does not preclude SARS-Cov-2 infection and should not be used as the sole basis for treatment or other patient management decisions. A negative result may occur with  improper specimen collection/handling, submission of specimen other than nasopharyngeal swab,  presence of viral mutation(s) within the areas targeted by this assay, and inadequate number of viral copies(<138 copies/mL). A negative result must be combined with clinical observations, patient history, and epidemiological information. The expected result is Negative.  Fact Sheet for Patients:  BloggerCourse.com  Fact Sheet for Healthcare Providers:  SeriousBroker.it  This test is no t yet approved or cleared by the [place]  FDA and  has been authorized for detection and/or diagnosis of SARS-CoV-2 by FDA under an Emergency Use Authorization (EUA). This EUA will remain  in effect (meaning this test can be used) for the duration of the COVID-19 declaration under Section 564(b)(1) of the Act, 21 U.S.C.section 360bbb-3(b)(1), unless the authorization is terminated  or revoked sooner.       Influenza A by PCR NEGATIVE NEGATIVE Final   Influenza B by PCR NEGATIVE NEGATIVE Final    Comment: (NOTE) The Xpert Xpress SARS-CoV-2/FLU/RSV plus assay is intended as an aid in the diagnosis of influenza from Nasopharyngeal swab specimens and should not be used as a sole basis for treatment.  Nasal washings and aspirates are unacceptable for Xpert Xpress SARS-CoV-2/FLU/RSV testing.  Fact Sheet for Patients: BloggerCourse.com  Fact Sheet for Healthcare Providers: SeriousBroker.it  This test is not yet approved or cleared by the [place]  FDA and has been authorized for detection and/or diagnosis of SARS-CoV-2 by FDA under an Emergency Use Authorization (EUA). This EUA will remain in effect (meaning this test can be used) for the duration of the COVID-19 declaration under Section 564(b)(1) of the Act, 21 U.S.C. section 360bbb-3(b)(1), unless the authorization is terminated or revoked.  Performed at [facility], [address] [place], [place] [address]     Time coordinating discharge: Over 30 minutes  Marien LITTIE Piety, MD  [facility] 07/15/2024, 4:12 PM

## 2024-07-15 NOTE — Op Note (Signed)
 [facility] Gastroenterology Patient Name: Craig York Procedure Date: 07/15/2024 11:06 AM MRN: [id number] Account #: [id number] Date of Birth: 07-Mar-1951 Admit Type: Outpatient Age: 73 Room: [facility] ENDO ROOM 4 Gender: Male Note Status: Finalized Instrument Name: Upper GI Scope [id number] Procedure:             Upper GI endoscopy Indications:           Melena Providers:             Rogelia Copping MD, MD Referring MD:          Nancyann BRAVO. Gasper, MD (Referring MD) Medicines:             Propofol  per Anesthesia Complications:         No immediate complications. Procedure:             Pre-Anesthesia Assessment:                        - Prior to the procedure, a History and Physical was                         performed, and patient medications and allergies were                         reviewed. The patient's tolerance of previous                         anesthesia was also reviewed. The risks and benefits                         of the procedure and the sedation options and risks                         were discussed with the patient. All questions were                         answered, and informed consent was obtained. Prior                         Anticoagulants: The patient has taken no anticoagulant                         or antiplatelet agents. ASA Grade Assessment: II - A                         patient with mild systemic disease. After reviewing                         the risks and benefits, the patient was deemed in                         satisfactory condition to undergo the procedure.                        After obtaining informed consent, the endoscope was                         passed under direct vision. Throughout the procedure,  the patient's blood pressure, pulse, and oxygen                         saturations were monitored continuously. The Endoscope                         was introduced through the mouth, and advanced to  the                         third part of duodenum. The upper GI endoscopy was                         accomplished without difficulty. The patient tolerated                         the procedure well. Findings:      The Z-line was irregular and was found at the gastroesophageal junction.      A few localized erosions with no bleeding and no stigmata of recent       bleeding were found in the gastric antrum.      A single medium pedunculated polyp with no bleeding and no stigmata of       recent bleeding was found in the gastric antrum. Biopsies were taken       with a cold forceps for histology.      The examined duodenum was normal. Impression:            - Z-line irregular, at the gastroesophageal junction.                        - Gastric erosions with no bleeding and no stigmata of                         recent bleeding.                        - A single gastric polyp. Biopsied.                        - Normal examined duodenum. Recommendation:        - Return patient to hospital ward for ongoing care.                        - Resume previous diet.                        - Continue present medications.                        - Await pathology results. Procedure Code(s):     --- Professional ---                        [phone], Esophagogastroduodenoscopy, flexible,                         transoral; with biopsy, single or multiple Diagnosis Code(s):     --- Professional ---                        K92.1, Melena (includes Hematochezia)  K25.9, Gastric ulcer, unspecified as acute or chronic,                         without hemorrhage or perforation                        K31.7, Polyp of stomach and duodenum CPT copyright 2022 American Medical Association. All rights reserved. The codes documented in this report are preliminary and upon coder review may  be revised to meet current compliance requirements. Rogelia Copping MD, MD 07/15/2024 11:20:19 AM This report has  been signed electronically. Number of Addenda: 0 Note Initiated On: 07/15/2024 11:06 AM Estimated Blood Loss:  Estimated blood loss: none.      [facility]

## 2024-07-15 NOTE — Transfer of Care (Signed)
 Immediate Anesthesia Transfer of Care Note  Patient: Craig York  Procedure(s) Performed: EGD (ESOPHAGOGASTRODUODENOSCOPY)  Patient Location: PACU and Endoscopy Unit  Anesthesia Type:MAC  Level of Consciousness: drowsy  Airway & Oxygen Therapy: Patient Spontanous Breathing  Post-op Assessment: Report given to RN and Post -op Vital signs reviewed and stable  Post vital signs: Reviewed and stable  Last Vitals:  Vitals Value Taken Time  BP    Temp    Pulse    Resp    SpO2      Last Pain:  Vitals:   07/15/24 1032  TempSrc: Temporal  PainSc:          Complications: No notable events documented.

## 2024-07-15 NOTE — Discharge Instructions (Addendum)
 Please make an appointment with your primary doctor in 1 week to recheck your blood levels. Your hemoglobin today is 9.3  Follow up with GI who will go over your results from your EGD Please continue to take your iron supplement. Of note, this can make your stool dark

## 2024-07-15 NOTE — Anesthesia Postprocedure Evaluation (Signed)
 Anesthesia Post Note  Patient: Craig York  Procedure(s) Performed: EGD (ESOPHAGOGASTRODUODENOSCOPY)  Patient location during evaluation: Endoscopy Anesthesia Type: General Level of consciousness: awake and alert Pain management: pain level controlled Vital Signs Assessment: post-procedure vital signs reviewed and stable Respiratory status: spontaneous breathing, nonlabored ventilation, respiratory function stable and patient connected to nasal cannula oxygen Cardiovascular status: blood pressure returned to baseline and stable Postop Assessment: no apparent nausea or vomiting Anesthetic complications: no   No notable events documented.   Last Vitals:  Vitals:   07/15/24 1032 07/15/24 1119  BP: (!) 143/94 126/60  Pulse: 68 86  Resp:    Temp: (!) 36.2 C (!) 36.1 C  SpO2: 100% 99%    Last Pain:  Vitals:   07/15/24 1119  TempSrc: Temporal  PainSc: Asleep                 Debby Mines

## 2024-07-15 NOTE — Progress Notes (Incomplete)
 PROGRESS NOTE  Craig York    DOB: [birth year], 73 y.o.  FMW:[id number]    Code Status: Full Code   DOA: 07/14/2024   LOS: 0   Brief hospital course  Craig York is a 73 y.o. male with a PMH significant for MI, anemia, gastric polyp, CABG, type II DM. At baseline, they live independently and are ambulatory and independent with ADLs.   They presented from home to the ED on 07/14/2024 with shortness of breath progressively worsening  x about 10 days. He states that for about 10 days he has noticed having dark stools with intermittent dark redness. No abdominal pain.  He normally walks 1-1.5 miles twice a week but now gets short of breath just walking short distances. He denies chest pain or SOB at rest.  Denies blood thinners or history of blood thinner.  He takes occasional ibuprofen  for headaches and last time was about a week ago.  He presented today because he became so weak today had trouble standing.    In the ED, it was found that they had stable vital signs.  Significant findings included: initial hgb 5.9 (last hgb was 9.3 5 months ago) otherwise mostly unremarkable.  No active disease on chest xray.    They were initially treated with 2 units of pRBCs.    Patient was admitted to medicine service for further workup and management of GI bleed as outlined in detail below.  07/15/24 -***  Assessment & Plan  Principal Problem:   GI bleeding Active Problems:   Melena   Upper GI bleed   Symptomatic anemia  GI bleed- suspect upper in setting of NSAID use.  Acute blood loss anemia on chronic iron deficiency anemia- hgb 5.9 on presentation.  - GI consulted. Planning EGD tomorrow - avoid NSAIDs - 2u pRBCs ordered and running currently. H&H when completed and CBC am - hold home ASA - IV protonix  - continue home iron supplement   Type II DM- jardiance , metformin  and glipizide  at home - sliding scale - continue rosuvastatin    HTN-  - continue home losartan ,  metoprolol   Body mass index is 24.8 kg/m.  VTE ppx: SCDs Start: 07/14/24 1154   Diet:     Diet   Diet NPO time specified Except for: Sips with Meds   Consultants: ***  Subjective 07/15/24    Pt reports ***   Objective  Blood pressure 115/63, pulse 78, temperature 98.6 F (37 C), temperature source Oral, resp. rate 16, height 6' 1 (1.854 m), weight 85.3 kg, SpO2 100%.  Intake/Output Summary (Last 24 hours) at 07/15/2024 0743 Last data filed at 07/14/2024 1708 Gross per 24 hour  Intake 348.17 ml  Output 200 ml  Net 148.17 ml   Filed Weights   07/14/24 1025  Weight: 85.3 kg     Physical Exam: *** General: awake, alert, NAD HEENT: atraumatic, clear conjunctiva, anicteric sclera, MMM, hearing grossly normal Respiratory: normal respiratory effort. Cardiovascular: quick capillary refill, normal S1/S2, RRR, no JVD, murmurs Gastrointestinal: soft, NT, ND Nervous: A&O x3. no gross focal neurologic deficits, normal speech Extremities: moves all equally, no edema, normal tone Skin: dry, intact, normal temperature, normal color. No rashes, lesions or ulcers on exposed skin Psychiatry: normal mood, congruent affect  Labs   I have personally reviewed the following labs and imaging studies CBC    Component Value Date/Time   WBC 4.4 07/15/2024 0528   RBC 3.04 (L) 07/15/2024 0528   HGB 7.3 (L) 07/15/2024 9471  HGB 9.3 (L) 02/15/2024 1117   HCT 24.6 (L) 07/15/2024 0528   HCT 32.2 (L) 02/15/2024 1117   PLT 195 07/15/2024 0528   PLT 251 06/29/2023 1035   MCV 80.9 07/15/2024 0528   MCV 82 06/29/2023 1035   MCH 24.0 (L) 07/15/2024 0528   MCHC 29.7 (L) 07/15/2024 0528   RDW 14.2 07/15/2024 0528   RDW 14.9 06/29/2023 1035   LYMPHSABS 0.8 06/29/2023 1035   MONOABS 0.3 06/11/2023 1512   EOSABS 0.1 06/29/2023 1035   BASOSABS 0.1 06/29/2023 1035      Latest Ref Rng & Units 07/14/2024   10:22 AM 02/15/2024   11:17 AM 06/11/2023   11:11 AM  BMP  Glucose 70 - 99 mg/dL 805   891  834   BUN 8 - 23 mg/dL 12  15  11    Creatinine 0.61 - 1.24 mg/dL 8.98  9.05  9.16   BUN/Creat Ratio 10 - 24  16    Sodium 135 - 145 mmol/L 140  138  137   Potassium 3.5 - 5.1 mmol/L 3.8  4.4  4.1   Chloride 98 - 111 mmol/L 107  102  106   CO2 22 - 32 mmol/L 22  22  22    Calcium  8.9 - 10.3 mg/dL 9.0  9.5  9.2     No results found.  Disposition Plan & Communication  Patient status: Observation  Admitted From: {From:23814} Planned disposition location: {PLAN; DISPOSITION:26386} Anticipated discharge date: *** pending ***  Family Communication: ***    Author: Marien LITTIE Piety, DO [facility] 07/15/2024, 7:43 AM   Available by Epic secure chat 7AM-7PM. If 7PM-7AM, please contact night-coverage.  [facility] contact information found on [email or web address].

## 2024-07-15 NOTE — Anesthesia Preprocedure Evaluation (Signed)
 Anesthesia Evaluation  Patient identified by MRN, date of birth, ID band Patient awake    Reviewed: Allergy & Precautions, H&P , NPO status , Patient's Chart, lab work & pertinent test results, reviewed documented beta blocker date and time   Airway Mallampati: II  TM Distance: >3 FB Neck ROM: full    Dental  (+) Poor Dentition   Pulmonary neg pulmonary ROS, former smoker   Pulmonary exam normal        Cardiovascular Exercise Tolerance: Poor hypertension, On Medications + angina with exertion + CAD and + Past MI  Normal cardiovascular exam     Neuro/Psych  Neuromuscular disease  negative psych ROS   GI/Hepatic negative GI ROS, Neg liver ROS,,,  Endo/Other  diabetes, Well Controlled, Type 2, Oral Hypoglycemic Agents    Renal/GU      Musculoskeletal   Abdominal   Peds  Hematology  (+) Blood dyscrasia, anemia   Anesthesia Other Findings Past Medical History: No date: Anginal pain (HCC) No date: Benign neoplasm of descending colon No date: BPH (benign prostatic hyperplasia) No date: Coronary artery disease No date: Diabetes mellitus without complication (HCC)     Comment:  Type II 07/23/2008: Hemorrhage of gastrointestinal tract No date: Hyperlipidemia No date: Hypertension No date: Iron deficiency anemia No date: Myocardial infarction (HCC) 12/25/2019: Myocardial infarction involving left anterior descending  (LAD) coronary artery (HCC) No date: Polyp of sigmoid colon 04/12/2020: Symptomatic anemia Past Surgical History: No date: CARDIAC CATHETERIZATION No date: chest surgery for fungal infection 07/10/2017: COLONOSCOPY WITH PROPOFOL ; N/A     Comment:  Procedure: COLONOSCOPY WITH PROPOFOL ;  Surgeon: Jinny Carmine, MD;  Location: [facility] ENDOSCOPY;  Service:               Endoscopy;  Laterality: N/A; 04/13/2020: COLONOSCOPY WITH PROPOFOL ; N/A     Comment:  Procedure: COLONOSCOPY WITH PROPOFOL ;   Surgeon: Jinny Carmine, MD;  Location: [facility] ENDOSCOPY;  Service:               Endoscopy;  Laterality: N/A; 08/12/2019: CORONARY ARTERY BYPASS GRAFT; N/A     Comment:  Procedure: CORONARY ARTERY BYPASS GRAFTING (CABG)X 4;                Surgeon: German Bartlett PEDLAR, MD;  Location: [facility] OR;                Service: Open Heart Surgery;  Laterality: N/A;  CABG x  4              using bilateral internal mammary arteries and               endoscopically harvested left saphenous vein 07/10/2017: ESOPHAGOGASTRODUODENOSCOPY (EGD) WITH PROPOFOL ; N/A     Comment:  Procedure: ESOPHAGOGASTRODUODENOSCOPY (EGD) WITH               PROPOFOL ;  Surgeon: Jinny Carmine, MD;  Location: [facility]               ENDOSCOPY;  Service: Endoscopy;  Laterality: N/A; 04/13/2020: ESOPHAGOGASTRODUODENOSCOPY (EGD) WITH PROPOFOL ; N/A     Comment:  Procedure: ESOPHAGOGASTRODUODENOSCOPY (EGD) WITH               PROPOFOL ;  Surgeon: Jinny Carmine, MD;  Location: [facility]               ENDOSCOPY;  Service: Endoscopy;  Laterality: N/A; 04/14/2020: GIVENS CAPSULE STUDY; N/A     Comment:  Procedure: GIVENS CAPSULE STUDY;  Surgeon: Therisa Bi,               MD;  Location: [facility] ENDOSCOPY;  Service:               Gastroenterology;  Laterality: N/A; No date: HERNIA REPAIR 05/06/2020: INTERCOSTAL NERVE BLOCK; Right     Comment:  Procedure: INTERCOSTAL NERVE BLOCK;  Surgeon: Shyrl Linnie KIDD, MD;  Location: [facility] OR;  Service: Thoracic;                Laterality: Right; 07/21/2019: LEFT HEART CATH AND CORONARY ANGIOGRAPHY; Left     Comment:  Procedure: LEFT HEART CATH AND CORONARY ANGIOGRAPHY;                Surgeon: Florencio Cara BIRCH, MD;  Location: [facility] INVASIVE              CV LAB;  Service: Cardiovascular;  Laterality: Left; 08/12/2019: TEE WITHOUT CARDIOVERSION; N/A     Comment:  Procedure: TRANSESOPHAGEAL ECHOCARDIOGRAM (TEE);                Surgeon: German Bartlett PEDLAR, MD;  Location: [facility] OR;                Service: Open  Heart Surgery;  Laterality: N/A; BMI    Body Mass Index: 24.23 kg/m     Reproductive/Obstetrics negative OB ROS                              Anesthesia Physical Anesthesia Plan  ASA: 3  Anesthesia Plan: General   Post-op Pain Management: Minimal or no pain anticipated   Induction: Intravenous  PONV Risk Score and Plan: 2 and Propofol  infusion and TIVA  Airway Management Planned: Nasal Cannula  Additional Equipment: None  Intra-op Plan:   Post-operative Plan:   Informed Consent: I have reviewed the patients History and Physical, chart, labs and discussed the procedure including the risks, benefits and alternatives for the proposed anesthesia with the patient or authorized representative who has indicated his/her understanding and acceptance.     Dental advisory given  Plan Discussed with: CRNA and Surgeon  Anesthesia Plan Comments: (Discussed risks of anesthesia with patient, including possibility of difficulty with spontaneous ventilation under anesthesia necessitating airway intervention, PONV, and rare risks such as cardiac or respiratory or neurological events, and allergic reactions. Discussed the role of CRNA in patient's perioperative care. Patient understands.)        Anesthesia Quick Evaluation

## 2024-07-15 NOTE — Plan of Care (Signed)

## 2024-07-15 NOTE — Progress Notes (Signed)
 Discharge instructions were reviewed with patient and family. IV were removed. Questions were encouraged and answered. Belongings collected by patient.

## 2024-07-15 NOTE — Anesthesia Procedure Notes (Signed)
 Date/Time: 07/15/2024 11:11 AM  Performed by: Anice Melnick, CRNAOxygen Delivery Method: Simple face mask

## 2024-07-16 ENCOUNTER — Telehealth: Payer: Self-pay

## 2024-07-16 LAB — TYPE AND SCREEN
ABO/RH(D): A POS
Antibody Screen: NEGATIVE
Unit division: 0
Unit division: 0
Unit division: 0

## 2024-07-16 LAB — BPAM RBC
Blood Product Expiration Date: 202508272359
Blood Product Expiration Date: 202508272359
Blood Product Expiration Date: 202509112359
ISSUE DATE / TIME: 202508190816
ISSUE DATE / TIME: 202508181140
ISSUE DATE / TIME: 202508181512
Unit Type and Rh: 600
Unit Type and Rh: 600
Unit Type and Rh: 6200

## 2024-07-16 LAB — SURGICAL PATHOLOGY

## 2024-07-16 NOTE — Transitions of Care (Post Inpatient/ED Visit) (Signed)
 07/16/2024  Name: Craig York MRN: [id number] DOB: 26-Mar-1951  Today's TOC FU Call Status: Today's TOC FU Call Status:: Successful TOC FU Call Completed TOC FU Call Complete Date: 07/16/24 Patient's Name and Date of Birth confirmed.  Transition Care Management Follow-up Telephone Call Date of Discharge: 07/15/24 Discharge Facility: [facility]) Type of Discharge: Inpatient Admission Primary Inpatient Discharge Diagnosis:: GI bleed How have you been since you were released from the hospital?: Better Any questions or concerns?: No  Items Reviewed: Did you receive and understand the discharge instructions provided?: Yes Medications obtained,verified, and reconciled?: Yes (Medications Reviewed) Any new allergies since your discharge?: No Dietary orders reviewed?: Yes Do you have support at home?: Yes People in Home [RPT]: spouse  Medications Reviewed Today: Medications Reviewed Today     Reviewed by Emmitt Pan, LPN (Licensed Practical Nurse) on 07/16/24 at 1230  Med List Status: <None>   Medication Order Taking? Sig Documenting Provider Last Dose Status Informant  acetaminophen  (TYLENOL ) 500 MG tablet [id number] Yes Take 1,000 mg by mouth every 6 (six) hours as needed for moderate pain. [provider]  Active Self  empagliflozin  (JARDIANCE ) 25 MG TABS tablet [id number] Yes Take 1 tablet (25 mg total) by mouth daily. Gasper Nancyann BRAVO, MD  Active Self  ferrous sulfate  325 (65 FE) MG tablet [id number] Yes Take 1 tablet (325 mg total) by mouth 2 (two) times daily with a meal. Laurita Pillion, MD  Active Self  fluticasone  (FLONASE ) 50 MCG/ACT nasal spray [id number] Yes Place 2 sprays into both nostrils daily. Gasper Nancyann BRAVO, MD  Active Self  glipiZIDE  (GLUCOTROL  XL) 5 MG 24 hr tablet [id number] Yes Take 1 tablet by mouth once daily with breakfast Gasper Nancyann BRAVO, MD  Active Self  losartan  (COZAAR ) 50 MG tablet [id number] Yes Take 50 mg by mouth  daily. Callwood, Cara BIRCH, MD  Active Self  metFORMIN  (GLUCOPHAGE ) 500 MG tablet [id number] Yes Take 2 tablets (1,000 mg total) by mouth 2 (two) times daily with a meal. Gasper Nancyann BRAVO, MD  Active Self  metoprolol  succinate (TOPROL -XL) 25 MG 24 hr tablet [id number] Yes Take 1 tablet by mouth once daily Fisher, Donald E, MD  Active Self  oxyCODONE -acetaminophen  (PERCOCET) 10-325 MG tablet [id number] Yes Take 1 tablet by mouth every 8 (eight) hours as needed for pain. Gasper Nancyann BRAVO, MD  Active Self  pantoprazole  (PROTONIX ) 40 MG tablet [id number] Yes Take 1 tablet by mouth once daily Gasper Nancyann BRAVO, MD  Active Self  rosuvastatin  (CRESTOR ) 20 MG tablet [id number] Yes Take 20 mg by mouth daily. [provider]  Active Self  SYSTANE COMPLETE 0.6 % SOLN [id number] Yes Place 1 drop into both eyes 3 (three) times daily as needed (dry/irritated eyes.). [provider]  Active Self  tadalafil  (CIALIS ) 20 MG tablet [id number] Yes Take 1 tablet (20 mg total) by mouth daily as needed for erectile dysfunction. Gasper Nancyann BRAVO, MD  Active Self            Home Care and Equipment/Supplies: Were Home Health Services Ordered?: NA Any new equipment or medical supplies ordered?: NA  Functional Questionnaire: Do you need assistance with bathing/showering or dressing?: No Do you need assistance with meal preparation?: No Do you need assistance with eating?: No Do you have difficulty maintaining continence: No Do you need assistance with getting out of bed/getting out of a chair/moving?: No Do you have difficulty managing or taking your medications?: No  Follow up appointments reviewed: PCP  Follow-up appointment confirmed?: No (declined hospital f/u, will kept his appt on 07/30/2024) MD Provider Line Number:[phone] Given: No Specialist Hospital Follow-up appointment confirmed?: NA Do you need transportation to your follow-up appointment?: No Do you understand care options if your  condition(s) worsen?: Yes-patient verbalized understanding    SIGNATURE Julian Lemmings, LPN [facility] Nurse Health Advisor Direct Dial [phone]

## 2024-07-17 ENCOUNTER — Ambulatory Visit: Payer: Self-pay | Admitting: Gastroenterology

## 2024-07-24 ENCOUNTER — Other Ambulatory Visit: Payer: Self-pay | Admitting: Family Medicine

## 2024-07-24 DIAGNOSIS — M5416 Radiculopathy, lumbar region: Secondary | ICD-10-CM

## 2024-07-24 DIAGNOSIS — R52 Pain, unspecified: Secondary | ICD-10-CM

## 2024-07-24 DIAGNOSIS — M5430 Sciatica, unspecified side: Secondary | ICD-10-CM

## 2024-07-24 NOTE — Telephone Encounter (Signed)
 Copied from CRM [id number]. Topic: Clinical - Medication Refill >> Jul 24, 2024  1:13 PM Rosaria E wrote: Medication: oxyCODONE -acetaminophen  (PERCOCET) 10-325 MG tablet  Has the patient contacted their pharmacy? Yes (Agent: If no, request that the patient contact the pharmacy for the refill. If patient does not wish to contact the pharmacy document the reason why and proceed with request.) (Agent: If yes, when and what did the pharmacy advise?)  This is the patient's preferred pharmacy:  [facility] [address]), [place] - [address] [place]) [place] [address] Phone: [phone] Fax: [phone]  Is this the correct pharmacy for this prescription? Yes If no, delete pharmacy and type the correct one.   Has the prescription been filled recently? Yes  Is the patient out of the medication? Yes  Has the patient been seen for an appointment in the last year OR does the patient have an upcoming appointment? Yes  Can we respond through MyChart? Yes  Agent: Please be advised that Rx refills may take up to 3 business days. We ask that you follow-up with your pharmacy.

## 2024-07-25 ENCOUNTER — Ambulatory Visit: Payer: Self-pay

## 2024-07-25 MED ORDER — OXYCODONE-ACETAMINOPHEN 10-325 MG PO TABS: 1.0000 | ORAL_TABLET | Freq: Three times a day (TID) | ORAL | 0 refills | Status: DC | PRN

## 2024-07-25 NOTE — Telephone Encounter (Signed)
 FYI Only or Action Required?: Action required by provider: medication refill request.  Patient was last seen in primary care on 04/18/2024 by Gasper Nancyann BRAVO, MD.  Called Nurse Triage reporting Foot Pain and Medication Refill.   Triage Disposition: Call PCP When Office is Open  Patient/caregiver understands and will follow disposition?: Yes      Copied from CRM [id number]. Topic: Clinical - Red Word Triage >> Jul 25, 2024  9:13 AM Suzen RAMAN wrote: Red Word that prompted transfer to Nurse Triage: left foot pain due to not having pain medication refill request submitted 07/24/24. Advised of refill processing time Reason for Disposition  Caller requesting a CONTROLLED substance prescription refill (e.g., narcotics, ADHD medicines)  Answer Assessment - Initial Assessment Questions 1. DRUG NAME: What medicine do you need to have refilled?     oxyCODONE -acetaminophen  (PERCOCET) 10-325 MG tablet 2. REFILLS REMAINING: How many refills are remaining? Notes: The label on the medicine or pill bottle will show how many refills are remaining. If there are no refills remaining, then a renewal may be needed.     0 3. EXPIRATION DATE: What is the expiration date? Note: The label states when the prescription will expire, and thus can no longer be refilled.)     N/a 4. PRESCRIBER: Who prescribed it? Note: The prescribing doctor or group is responsible for refill approvals.[name] Gasper Nancyann BRAVO, MD 5. PHARMACY: Have you contacted your pharmacy (drugstore)? Note: Some pharmacies will contact the doctor (or NP/PA).      Endorses no refills 6. SYMPTOMS: Do you have any symptoms?     L foot pain - chronic issue 7. PREGNANCY: Is there any chance that you are pregnant? When was your last menstrual period?     N/a    [facility] reinforced Rx turn-around time and encouraged UC visit for pain management in the interim. Patient verbalized understanding.  Protocols used: Medication Refill and  Renewal Call-A-AH

## 2024-08-19 ENCOUNTER — Ambulatory Visit: Admitting: Family Medicine

## 2024-08-19 ENCOUNTER — Other Ambulatory Visit: Payer: Self-pay | Admitting: Family Medicine

## 2024-08-19 DIAGNOSIS — E1129 Type 2 diabetes mellitus with other diabetic kidney complication: Secondary | ICD-10-CM

## 2024-08-22 ENCOUNTER — Ambulatory Visit (INDEPENDENT_AMBULATORY_CARE_PROVIDER_SITE_OTHER): Admitting: Family Medicine

## 2024-08-22 DIAGNOSIS — D509 Iron deficiency anemia, unspecified: Secondary | ICD-10-CM | POA: Diagnosis not present

## 2024-08-22 DIAGNOSIS — M5416 Radiculopathy, lumbar region: Secondary | ICD-10-CM

## 2024-08-22 DIAGNOSIS — R52 Pain, unspecified: Secondary | ICD-10-CM

## 2024-08-22 DIAGNOSIS — K922 Gastrointestinal hemorrhage, unspecified: Secondary | ICD-10-CM | POA: Diagnosis not present

## 2024-08-22 DIAGNOSIS — M5430 Sciatica, unspecified side: Secondary | ICD-10-CM | POA: Diagnosis not present

## 2024-08-22 DIAGNOSIS — R1013 Epigastric pain: Secondary | ICD-10-CM

## 2024-08-22 MED ORDER — OXYCODONE-ACETAMINOPHEN 10-325 MG PO TABS: 1.0000 | ORAL_TABLET | Freq: Three times a day (TID) | ORAL | 0 refills | Status: DC | PRN

## 2024-08-22 NOTE — Progress Notes (Signed)
 Established patient visit   Patient: Craig York   DOB: 09-17-1951   73 y.o. Male  MRN: [id number] Visit Date: 08/22/2024  Today's healthcare provider: Nancyann Perry, MD   Chief Complaint  Patient presents with   Hospitalization Follow-up    Patient was admitted to [facility] on 07/14/24 and discharged on 07/15/24. He was treated for GI bleeding. Treatment for this included EGD. Telephone follow up was done on 07/16/24 He was advised to stop taking NSAIDs and follow-up w/GI He reports excellent compliance with treatment.has not seen GI or heard from them. He reports this condition is resolved.   Subjective    Discussed the use of AI scribe software for clinical note transcription with the patient, who gave verbal consent to proceed.  History of Present Illness   Craig York is a 73 year old male who presents for follow-up after hospitalization for an upper GI bleed.  He was hospitalized from August 18th to August 19th for an upper gastrointestinal bleed, during which he required transfusion of three units of packed red blood cells. An upper endoscopy performed during the hospitalization revealed multiple polyps but no active bleeding. He was discharged with a hemoglobin level of 9.3. He feels much better since returning home and has not experienced any further trouble breathing or fatigue. No stomach pain or upset stomach since discharge.  He has been taking iron supplements, two in the morning and two at night, but they cause gastrointestinal discomfort.  His last colonoscopy in August 2022 showed a few perianal hemorrhoids, small mouth diverticuli, and internal hemorrhoids.  He is currently taking metformin  500 mg, which was recently refilled, and oxycodone , for which he requested a prescription refill.       Medications: Outpatient Medications Prior to Visit  Medication Sig   acetaminophen  (TYLENOL ) 500 MG tablet Take 1,000 mg by mouth every 6 (six) hours as  needed for moderate pain.   empagliflozin  (JARDIANCE ) 25 MG TABS tablet Take 1 tablet (25 mg total) by mouth daily.   ferrous sulfate  325 (65 FE) MG tablet Take 1 tablet (325 mg total) by mouth 2 (two) times daily with a meal.   fluticasone  (FLONASE ) 50 MCG/ACT nasal spray Place 2 sprays into both nostrils daily.   glipiZIDE  (GLUCOTROL  XL) 5 MG 24 hr tablet Take 1 tablet by mouth once daily with breakfast   losartan  (COZAAR ) 50 MG tablet Take 50 mg by mouth daily.   metFORMIN  (GLUCOPHAGE ) 500 MG tablet TAKE 2 TABLETS BY MOUTH TWICE DAILY WITH A MEAL   metoprolol  succinate (TOPROL -XL) 25 MG 24 hr tablet Take 1 tablet by mouth once daily   pantoprazole  (PROTONIX ) 40 MG tablet Take 1 tablet by mouth once daily   rosuvastatin  (CRESTOR ) 20 MG tablet Take 20 mg by mouth daily.   SYSTANE COMPLETE 0.6 % SOLN Place 1 drop into both eyes 3 (three) times daily as needed (dry/irritated eyes.).   tadalafil  (CIALIS ) 20 MG tablet Take 1 tablet (20 mg total) by mouth daily as needed for erectile dysfunction.   oxyCODONE -acetaminophen  (PERCOCET) 10-325 MG tablet Take 1 tablet by mouth every 8 (eight) hours as needed for pain.   No facility-administered medications prior to visit.   Review of Systems  Constitutional:  Negative for appetite change, chills and fever.  Respiratory:  Negative for chest tightness, shortness of breath and wheezing.   Cardiovascular:  Negative for chest pain and palpitations.  Gastrointestinal:  Negative for abdominal pain, nausea and vomiting.  Objective     Physical Exam   General: Appearance:    Well developed, well nourished male in no acute distress  Eyes:    PERRL, conjunctiva/corneas clear, EOM's intact       Lungs:     Clear to auscultation bilaterally, respirations unlabored  Heart:    Normal heart rate. Normal rhythm. No murmurs, rubs, or gallops.    MS:   All extremities are intact.    Neurologic:   Awake, alert, oriented x 3. No apparent focal neurological  defect.       Assessment & Plan     1. Upper GI bleed (Primary) Some gastric erosions and polyps on recent EGD, but no active bleeding.    - CBC with Differential/Platelet - H. pylori breath test - Iron, TIBC and Ferritin Panel  2. Iron deficiency anemia, unspecified iron deficiency anemia type Now on BID iron and he would like to cut back due to GI side effects. See how iron levels. Look.  - Iron, TIBC and Ferritin Panel  3. Epigastric pain  - CBC with Differential/Platelet - H. pylori breath test  4. Lumbar radiculopathy  5. Pain  6. Sciatica, unspecified laterality  Pain adequately controlled with no signs of diversion or abuse.   Refill  oxyCODONE -acetaminophen  (PERCOCET) 10-325 MG tablet; Take 1 tablet by mouth every 8 (eight) hours as needed for pain.  Dispense: 60 tablet; Refill: 0     Nancyann Perry, MD  [facility] [phone] (phone) [phone] (fax)  [facility]

## 2024-08-22 NOTE — Patient Instructions (Signed)
 [name]  Please review the attached list of medications and notify my office if there are any errors.   . Please bring all of your medications to every appointment so we can make sure that our medication list is the same as yours.

## 2024-08-22 NOTE — Progress Notes (Deleted)
 Follow up Hospitalization  Patient was admitted to [facility] on 07/14/24 and discharged on 07/15/24. He was treated for GI bleeding. Treatment for this included EGD. Telephone follow up was done on 07/16/24 He was advised to stop taking NSAIDs and follow-up w/GI He reports {excellent/good/fair:19992} compliance with treatment. He reports this condition is {resolved/improved/worsened:23923}.  ----------------------------------------------------------------------------------------- -

## 2024-08-23 LAB — SPECIMEN STATUS REPORT

## 2024-08-24 ENCOUNTER — Ambulatory Visit: Payer: Self-pay | Admitting: Family Medicine

## 2024-08-24 DIAGNOSIS — D509 Iron deficiency anemia, unspecified: Secondary | ICD-10-CM

## 2024-08-24 DIAGNOSIS — K922 Gastrointestinal hemorrhage, unspecified: Secondary | ICD-10-CM

## 2024-08-28 LAB — CBC WITH DIFFERENTIAL/PLATELET
Basophils Absolute: 0.1 x10E3/uL (ref 0.0–0.2)
Basos: 1 %
EOS (ABSOLUTE): 0.1 x10E3/uL (ref 0.0–0.4)
Eos: 2 %
Hematocrit: 31.8 % — ABNORMAL LOW (ref 37.5–51.0)
Hemoglobin: 8.9 g/dL — ABNORMAL LOW (ref 13.0–17.7)
Immature Grans (Abs): 0 x10E3/uL (ref 0.0–0.1)
Immature Granulocytes: 0 %
Lymphocytes Absolute: 0.9 x10E3/uL (ref 0.7–3.1)
Lymphs: 18 %
MCH: 23.7 pg — ABNORMAL LOW (ref 26.6–33.0)
MCHC: 28 g/dL — ABNORMAL LOW (ref 31.5–35.7)
MCV: 85 fL (ref 79–97)
Monocytes Absolute: 0.4 x10E3/uL (ref 0.1–0.9)
Monocytes: 7 %
Neutrophils Absolute: 3.6 x10E3/uL (ref 1.4–7.0)
Neutrophils: 72 %
Platelets: 239 x10E3/uL (ref 150–450)
RBC: 3.75 x10E6/uL — ABNORMAL LOW (ref 4.14–5.80)
RDW: 14.7 % (ref 11.6–15.4)
WBC: 5 x10E3/uL (ref 3.4–10.8)

## 2024-08-28 LAB — IRON,TIBC AND FERRITIN PANEL
Ferritin: 11 ng/mL — ABNORMAL LOW (ref 30–400)
UIBC: <17 ug/dL — ABNORMAL LOW (ref 111–343)

## 2024-08-28 LAB — H. PYLORI BREATH TEST: H pylori Breath Test: NEGATIVE

## 2024-09-02 NOTE — Progress Notes (Signed)
 Craig York                                          MRN: [id number]   09/02/2024   The [facility]BCI Quality Team Specialist reviewed this patient medical record for the purposes of chart review for care gap closure. The following were reviewed: chart review for care gap closure-kidney health evaluation for diabetes:eGFR  and uACR.    VBCI Quality Team

## 2024-09-08 ENCOUNTER — Telehealth: Payer: Self-pay

## 2024-09-08 NOTE — Telephone Encounter (Signed)
 Filled and mailed pt portion and faxed provider portion today. On BI jardiance 

## 2024-09-17 ENCOUNTER — Other Ambulatory Visit: Payer: Medicare HMO | Admitting: Pharmacist

## 2024-09-17 DIAGNOSIS — E1129 Type 2 diabetes mellitus with other diabetic kidney complication: Secondary | ICD-10-CM

## 2024-09-17 NOTE — Telephone Encounter (Signed)
 Received provider portion Bicares Jardiance  waiting on provider portion.

## 2024-09-17 NOTE — Patient Instructions (Signed)
 Goals Addressed             This Visit's Progress    Pharmacy Goals       If you need to reach out to patient assistance programs regarding refills or to find out the status of your application, you can do so by calling:  [employer] [phone]  Thank you!  Estelle Grumbles, PharmD, [facility] [phone]

## 2024-09-17 NOTE — Progress Notes (Signed)
 09/17/2024 Name: Craig York MRN: [id number] DOB: August 06, 1951  Chief Complaint  Patient presents with   Medication Assistance    Craig York is a 73 y.o. year old male who was referred to pharmacist for patient assistance program re-enrollment for 2025 calendar year   Conversation limited today as patient is not currently home   Subjective:   Care Team: Primary Care Provider: Gasper Nancyann BRAVO, MD; Next Scheduled Visit: 09/26/2024 Cardiologist: Craig Cara Endow, MD; Next Scheduled Visit: 03/03/2025 GI Specialist: Craig Elspeth Sharper, DO; Initial Scheduled Visit: 03/10/2025    Medication Access/Adherence  Current Pharmacy:  [facility] [address]), [place] - [address] [place] ([place]) [place] [address] Phone: [phone] Fax: [phone]  [facility] [id number] [place], [place] - [address] [place] [address] Phone: [phone] Fax: [phone]   Patient reports affordability concerns with their medications: No  Patient reports access/transportation concerns to their pharmacy: No  Patient reports adherence concerns with their medications:  No     Confirms currently taking ferrous sulfate  325 mg twice daily with meals as directed   Diabetes:   Current medications:  - Jardiance  25 mg daily - glipizide  ER 5 mg daily with breakfast - metformin  500 mg - 2 tablets twice daily with meal   Denies monitoring home blood sugar recently   Patient denies hypoglycemic s/sx including dizziness, shakiness, sweating.    Statin therapy: rosuvastatin  20 mg daily   Current medication access support: enrolled in patient assistance for Jardiance  through BI through 11/26/2024 - From review of chart, note Student Pharmacist Craig York mailed patient assistance program renewal application to patient on 09/08/2024. Patient confirms that he received this, completed and mailed it back to  team on Monday.   Objective:  Lab Results  Component Value Date   HGBA1C 6.2 (H) 02/15/2024    Lab Results  Component Value Date   CREATININE 1.01 07/14/2024   BUN 12 07/14/2024   NA 140 07/14/2024   K 3.8 07/14/2024   CL 107 07/14/2024   CO2 22 07/14/2024    Lab Results  Component Value Date   CHOL 84 (L) 04/18/2023   HDL 41 04/18/2023   LDLCALC 25 04/18/2023   TRIG 90 04/18/2023   CHOLHDL 2.0 04/18/2023    Medications Reviewed Today     Reviewed by Craig York, RPH-CPP (Pharmacist) on 09/17/24 at 1459  Med List Status: <None>   Medication Order Taking? Sig Documenting Provider Last Dose Status Informant  acetaminophen  (TYLENOL ) 500 MG tablet [id number]  Take 1,000 mg by mouth every 6 (six) hours as needed for moderate pain. [provider]  Active Self  empagliflozin  (JARDIANCE ) 25 MG TABS tablet [id number] Yes Take 1 tablet (25 mg total) by mouth daily. Craig Nancyann BRAVO, MD  Active Self  ferrous sulfate  325 (65 FE) MG tablet [id number] Yes Take 1 tablet (325 mg total) by mouth 2 (two) times daily with a meal. Laurita Pillion, MD  Active Self  fluticasone  (FLONASE ) 50 MCG/ACT nasal spray [id number]  Place 2 sprays into both nostrils daily. Craig Nancyann BRAVO, MD  Active Self  glipiZIDE  (GLUCOTROL  XL) 5 MG 24 hr tablet [id number] Yes Take 1 tablet by mouth once daily with breakfast Craig Nancyann BRAVO, MD  Active Self  losartan  (COZAAR ) 50 MG tablet [id number]  Take 50 mg by mouth daily. Craig Cara D, MD  Active Self  metFORMIN  (GLUCOPHAGE ) 500 MG tablet [id number]  Yes TAKE 2 TABLETS BY MOUTH TWICE DAILY WITH A MEAL Fisher, Nancyann BRAVO, MD  Active   metoprolol  succinate (TOPROL -XL) 25 MG 24 hr tablet [id number]  Take 1 tablet by mouth once daily Fisher, Donald E, MD  Active Self  oxyCODONE -acetaminophen  (PERCOCET) 10-325 MG tablet [id number]  Take 1 tablet by mouth every 8 (eight) hours as needed for pain. [place] Nancyann BRAVO, MD  Active   pantoprazole  (PROTONIX ) 40 MG  tablet [id number]  Take 1 tablet by mouth once daily Fisher, Nancyann BRAVO, MD  Active Self  rosuvastatin  (CRESTOR ) 20 MG tablet [id number]  Take 20 mg by mouth daily. [provider]  Active Self  SYSTANE COMPLETE 0.6 % SOLN [id number]  Place 1 drop into both eyes 3 (three) times daily as needed (dry/irritated eyes.). [provider]  Active Self  tadalafil  (CIALIS ) 20 MG tablet [id number]  Take 1 tablet (20 mg total) by mouth daily as needed for erectile dysfunction. Craig Nancyann BRAVO, MD  Active Self              Assessment/Plan:   Diabetes: - Currently controlled - Reviewed dietary modifications including importance of having regular well-balanced meals and snacks throughout the day, while controlling carbohydrate portion sizes - Recommend to check glucose, keep log of results and have this record to review at upcoming medical appointments. Patient to contact provider office sooner if needed for readings outside of established parameters or symptoms - Patient to follow up with [facility] as needed for refills of Jardiance  - Collaborating with CPhT team and PCP to support patient with re-enrollment in patient assistance program for Jardiance  from [facility]   Follow Up Plan: Schedule follow up for patient to speak with Clinical Pharmacist Peyton Ferries by telephone on 12/04/2024 at 11:00 AM        Sharyle Sia, PharmD, [facility] [phone]

## 2024-09-18 NOTE — Telephone Encounter (Signed)
 Received pt portion [employer] Jardiance  and proof of income.

## 2024-09-24 ENCOUNTER — Other Ambulatory Visit: Payer: Self-pay | Admitting: Family Medicine

## 2024-09-24 DIAGNOSIS — M5416 Radiculopathy, lumbar region: Secondary | ICD-10-CM

## 2024-09-24 DIAGNOSIS — M5430 Sciatica, unspecified side: Secondary | ICD-10-CM

## 2024-09-24 DIAGNOSIS — R52 Pain, unspecified: Secondary | ICD-10-CM

## 2024-09-24 NOTE — Telephone Encounter (Unsigned)
 Copied from CRM [id number]. Topic: Clinical - Medication Refill >> Sep 24, 2024  4:13 PM Wess RAMAN wrote: Medication: oxyCODONE -acetaminophen  (PERCOCET) 10-325 MG tablet   Has the patient contacted their pharmacy? No (Agent: If no, request that the patient contact the pharmacy for the refill. If patient does not wish to contact the pharmacy document the reason why and proceed with request.) (Agent: If yes, when and what did the pharmacy advise?)  This is the patient's preferred pharmacy:  [facility] [address] ([address]), [place] - [address] [place]) [place] [address] Phone: [phone] Fax: [phone]  Is this the correct pharmacy for this prescription? Yes If no, delete pharmacy and type the correct one.   Has the prescription been filled recently? Yes  Is the patient out of the medication? No  Has the patient been seen for an appointment in the last year OR does the patient have an upcoming appointment? Yes  Can we respond through MyChart? No  Agent: Please be advised that Rx refills may take up to 3 business days. We ask that you follow-up with your pharmacy.

## 2024-09-26 ENCOUNTER — Encounter: Payer: Self-pay | Admitting: Family Medicine

## 2024-09-26 ENCOUNTER — Ambulatory Visit: Admitting: Family Medicine

## 2024-09-26 VITALS — BP 130/70 | HR 78 | Resp 16 | Ht 73.0 in | Wt 182.0 lb

## 2024-09-26 DIAGNOSIS — Z7984 Long term (current) use of oral hypoglycemic drugs: Secondary | ICD-10-CM | POA: Diagnosis not present

## 2024-09-26 DIAGNOSIS — M5430 Sciatica, unspecified side: Secondary | ICD-10-CM

## 2024-09-26 DIAGNOSIS — E1129 Type 2 diabetes mellitus with other diabetic kidney complication: Secondary | ICD-10-CM | POA: Diagnosis not present

## 2024-09-26 DIAGNOSIS — M5416 Radiculopathy, lumbar region: Secondary | ICD-10-CM | POA: Diagnosis not present

## 2024-09-26 DIAGNOSIS — D509 Iron deficiency anemia, unspecified: Secondary | ICD-10-CM

## 2024-09-26 DIAGNOSIS — R809 Proteinuria, unspecified: Secondary | ICD-10-CM

## 2024-09-26 MED ORDER — OXYCODONE-ACETAMINOPHEN 10-325 MG PO TABS: 1.0000 | ORAL_TABLET | Freq: Three times a day (TID) | ORAL | 0 refills | Status: DC | PRN

## 2024-09-26 NOTE — Patient Instructions (Signed)
 [name]  Please review the attached list of medications and notify my office if there are any errors.   . Please bring all of your medications to every appointment so we can make sure that our medication list is the same as yours.

## 2024-09-26 NOTE — Progress Notes (Signed)
 Established patient visit   Patient: Craig York   DOB: 1951/03/22   73 y.o. Male  MRN: [id number] Visit Date: 09/26/2024  Today's healthcare provider: Nancyann Perry, MD   Chief Complaint  Patient presents with   Follow-up    1 months follow-up and hemoglobin check   Subjective    Discussed the use of AI scribe software for clinical note transcription with the patient, who gave verbal consent to proceed.  History of Present Illness   Craig York is a 73 year old male with iron deficiency anemia and a history of upper GI bleed who presents for follow-up.  He is currently taking two iron pills daily. He avoids greasy foods as he reports they cause him to bleed. He has bowel movements two to three times a day, which he describes as normal stools. No diarrhea or constipation.  He experiences good breathing and energy levels but has shortness of breath when lifting heavy objects. He discontinued glipizide  approximately six months ago.     Last CBC Lab Results  Component Value Date   WBC 5.0 08/22/2024   HGB 8.9 (L) 08/22/2024   HCT 31.8 (L) 08/22/2024   MCV 85 08/22/2024   MCH 23.7 (L) 08/22/2024   RDW 14.7 08/22/2024   PLT 239 08/22/2024   Lab Results  Component Value Date   IRON 408V.1 08/22/2024   TIBC CANCELED 08/22/2024   FERRITIN 11 (L) 08/22/2024   Lab Results  Component Value Date   HGBA1C 6.2 (H) 02/15/2024   Lab Results  Component Value Date   NA 140 07/14/2024   K 3.8 07/14/2024   CREATININE 1.01 07/14/2024   GFRNONAA >60 07/14/2024   GLUCOSE 194 (H) 07/14/2024     Medications: Outpatient Medications Prior to Visit  Medication Sig Note   acetaminophen  (TYLENOL ) 500 MG tablet Take 1,000 mg by mouth every 6 (six) hours as needed for moderate pain.    empagliflozin  (JARDIANCE ) 25 MG TABS tablet Take 1 tablet (25 mg total) by mouth daily.    ferrous sulfate  325 (65 FE) MG tablet Take 1 tablet (325 mg total) by mouth 2 (two) times daily  with a meal.    fluticasone  (FLONASE ) 50 MCG/ACT nasal spray Place 2 sprays into both nostrils daily.    losartan  (COZAAR ) 50 MG tablet Take 50 mg by mouth daily.    metFORMIN  (GLUCOPHAGE ) 500 MG tablet TAKE 2 TABLETS BY MOUTH TWICE DAILY WITH A MEAL    metoprolol  succinate (TOPROL -XL) 25 MG 24 hr tablet Take 1 tablet by mouth once daily    pantoprazole  (PROTONIX ) 40 MG tablet Take 1 tablet by mouth once daily    rosuvastatin  (CRESTOR ) 20 MG tablet Take 20 mg by mouth daily.    SYSTANE COMPLETE 0.6 % SOLN Place 1 drop into both eyes 3 (three) times daily as needed (dry/irritated eyes.).    tadalafil  (CIALIS ) 20 MG tablet Take 1 tablet (20 mg total) by mouth daily as needed for erectile dysfunction.    oxyCODONE -acetaminophen  (PERCOCET) 10-325 MG tablet Take 1 tablet by mouth every 8 (eight) hours as needed for pain.    [DISCONTINUED] glipiZIDE  (GLUCOTROL  XL) 5 MG 24 hr tablet Take 1 tablet by mouth once daily with breakfast 09/26/2024: stopped several months ago   No facility-administered medications prior to visit.   Review of Systems     Objective    BP 130/70 (BP Location: Left Arm, Patient Position: Sitting, Cuff Size: Normal)   Pulse  78   Resp 16   Ht 6' 1 (1.854 m)   Wt 182 lb (82.6 kg)   SpO2 99%   BMI 24.01 kg/m   Physical Exam   General: Appearance:    Well developed, well nourished male in no acute distress  Eyes:    PERRL, conjunctiva/corneas clear, EOM's intact       Lungs:     Clear to auscultation bilaterally, respirations unlabored  Heart:    Normal heart rate. Normal rhythm. No murmurs, rubs, or gallops.    MS:   All extremities are intact.    Neurologic:   Awake, alert, oriented x 3. No apparent focal neurological defect.         Assessment & Plan    1. Iron deficiency anemia, unspecified iron deficiency anemia type (Primary) Feels well taking iron supplement twice a day.  Has GI follow up scheduled in in the spring.   - CBC - Iron, TIBC and Ferritin  Panel  2. Type 2 diabetes mellitus with microalbuminuria, without long-term current use of insulin  (HCC) Has been off of glipizide  for several months and feels well on current regiment.  - Hemoglobin A1c - Urine Albumin /Creatinine with ratio (send out) [LAB689]  3. Lumbar radiculopathy Stable and well controlled.   4. Sciatica, unspecified laterality Stable and well controlled, no sign of opioid abuse or diversion.  Refill  oxyCODONE -acetaminophen  (PERCOCET) 10-325 MG tablet; Take 1 tablet by mouth every 8 (eight) hours as needed for pain.  Dispense: 60 tablet; Refill: 0  Return in about 5 months (around 02/16/2025).      Nancyann Perry, MD  [facility] [phone] (phone) [phone] (fax)  [facility]

## 2024-09-27 ENCOUNTER — Ambulatory Visit: Payer: Self-pay | Admitting: Family Medicine

## 2024-09-27 LAB — CBC
Hematocrit: 36.8 % — ABNORMAL LOW (ref 37.5–51.0)
Hemoglobin: 10.4 g/dL — ABNORMAL LOW (ref 13.0–17.7)
MCH: 22.8 pg — ABNORMAL LOW (ref 26.6–33.0)
MCHC: 28.3 g/dL — ABNORMAL LOW (ref 31.5–35.7)
MCV: 81 fL (ref 79–97)
Platelets: 263 x10E3/uL (ref 150–450)
RBC: 4.57 x10E6/uL (ref 4.14–5.80)
RDW: 15 % (ref 11.6–15.4)
WBC: 6.6 x10E3/uL (ref 3.4–10.8)

## 2024-09-27 LAB — IRON,TIBC AND FERRITIN PANEL
Ferritin: 14 ng/mL — ABNORMAL LOW (ref 30–400)
Iron Saturation: 95 % (ref 15–55)
Iron: 358 ug/dL (ref 38–169)
Total Iron Binding Capacity: 378 ug/dL (ref 250–450)
UIBC: 20 ug/dL — ABNORMAL LOW (ref 111–343)

## 2024-09-27 LAB — MICROALBUMIN / CREATININE URINE RATIO
Creatinine, Urine: 103 mg/dL
Microalb/Creat Ratio: 5 mg/g{creat} (ref 0–29)
Microalbumin, Urine: 5.4 ug/mL

## 2024-09-27 LAB — HEMOGLOBIN A1C
Est. average glucose Bld gHb Est-mCnc: 111 mg/dL
Hgb A1c MFr Bld: 5.5 % (ref 4.8–5.6)

## 2024-10-02 ENCOUNTER — Other Ambulatory Visit: Payer: Self-pay | Admitting: Family Medicine

## 2024-10-22 ENCOUNTER — Other Ambulatory Visit: Payer: Self-pay | Admitting: Family Medicine

## 2024-10-22 DIAGNOSIS — M5430 Sciatica, unspecified side: Secondary | ICD-10-CM

## 2024-10-22 DIAGNOSIS — M5416 Radiculopathy, lumbar region: Secondary | ICD-10-CM

## 2024-10-22 NOTE — Telephone Encounter (Signed)
 Copied from CRM #[id number]. Topic: Clinical - Medication Refill >> Oct 22, 2024 10:54 AM Donna BRAVO wrote: Medication: oxyCODONE -acetaminophen  (PERCOCET) 10-325 MG tablet  Has the patient contacted their pharmacy? No Pharmacy says to call provider every time   This is the patient's preferred pharmacy:   [facility] [address] ([address]), [place] - [address] [place]) [place] [address] Phone: [phone] Fax: [phone]   Is this the correct pharmacy for this prescription? Yes If no, delete pharmacy and type the correct one.   Has the prescription been filled recently? Yes  Is the patient out of the medication? No patient will be out of medication on Friday  Has the patient been seen for an appointment in the last year OR does the patient have an upcoming appointment? Yes  Can we respond through MyChart? Yes  Agent: Please be advised that Rx refills may take up to 3 business days. We ask that you follow-up with your pharmacy.

## 2024-10-27 MED ORDER — OXYCODONE-ACETAMINOPHEN 10-325 MG PO TABS: 1.0000 | ORAL_TABLET | Freq: Three times a day (TID) | ORAL | 0 refills | Status: DC | PRN

## 2024-10-27 NOTE — Telephone Encounter (Signed)
 LOV- 09/26/2024 NOV- 03/13/2025 LRF- 09/26/2024 Outpatient Medication Detail   Disp Refills Start End   oxyCODONE -acetaminophen  (PERCOCET) 10-325 MG tablet 60 tablet 0 09/26/2024 --   Sig - Route: Take 1 tablet by mouth every 8 (eight) hours as needed for pain. - Oral   Sent to pharmacy as: oxyCODONE -acetaminophen  (PERCOCET) 10-325 MG tablet   Earliest Fill Date: 09/26/2024   E-Prescribing Status: Receipt confirmed by pharmacy (09/26/2024  8:32 AM EDT)

## 2024-10-27 NOTE — Telephone Encounter (Signed)
 Requested medication (s) are due for refill today: yes  Requested medication (s) are on the active medication list: yes  Last refill:  09/26/24  Future visit scheduled: yes  Notes to clinic:  Unable to refill per protocol, cannot delegate.      Requested Prescriptions  Pending Prescriptions Disp Refills   oxyCODONE -acetaminophen  (PERCOCET) 10-325 MG tablet 60 tablet 0    Sig: Take 1 tablet by mouth every 8 (eight) hours as needed for pain.     Not Delegated - Analgesics:  Opioid Agonist Combinations Failed - 10/27/2024  1:56 PM      Failed - This refill cannot be delegated      Failed - Urine Drug Screen completed in last 360 days      Passed - Valid encounter within last 3 months    Recent Outpatient Visits           1 month ago Iron deficiency anemia, unspecified iron deficiency anemia type   [place] [facility] Gasper Nancyann BRAVO, MD   2 months ago Upper GI bleed   [place] [facility] Gasper Nancyann BRAVO, MD   6 months ago Acute midline low back pain without sciatica   [facility] Gasper Nancyann BRAVO, MD   8 months ago Prostate cancer screening   [facility] Gasper Nancyann BRAVO, MD

## 2024-10-28 ENCOUNTER — Telehealth: Payer: Self-pay

## 2024-10-28 NOTE — Telephone Encounter (Signed)
 Copied from CRM #[id number]. Topic: Clinical - Medication Refill >> Oct 22, 2024 10:54 AM Donna BRAVO wrote: Medication: oxyCODONE -acetaminophen  (PERCOCET) 10-325 MG tablet  Has the patient contacted their pharmacy? No Pharmacy says to call provider every time   This is the patient's preferred pharmacy:   [facility] [address] ([address]), [place] - [address] [place] [address]) [place] [address] Phone: [phone] Fax: [phone]   Is this the correct pharmacy for this prescription? Yes If no, delete pharmacy and type the correct one.   Has the prescription been filled recently? Yes  Is the patient out of the medication? No patient will be out of medication on Friday  Has the patient been seen for an appointment in the last year OR does the patient have an upcoming appointment? Yes  Can we respond through MyChart? Yes  Agent: Please be advised that Rx refills may take up to 3 business days. We ask that you follow-up with your pharmacy. >> Oct 28, 2024  9:56 AM Emylou G wrote: Pls call patient and or - he is checking status of his oxyCODONE -acetaminophen  (PERCOCET) 10-325 MG tablet refill?  He is confused on if it will get refilled.. he was at the office yesterday talking to the front desk.

## 2024-10-28 NOTE — Telephone Encounter (Signed)
 Prescription sent in yesterday. Patient advised.

## 2024-11-05 NOTE — Telephone Encounter (Signed)
 Faxed pt and provider portion to [facility] along proof of income and Ins card

## 2024-11-16 ENCOUNTER — Other Ambulatory Visit: Payer: Self-pay | Admitting: Family Medicine

## 2024-11-16 DIAGNOSIS — N529 Male erectile dysfunction, unspecified: Secondary | ICD-10-CM

## 2024-11-25 ENCOUNTER — Other Ambulatory Visit: Payer: Self-pay | Admitting: Family Medicine

## 2024-11-25 DIAGNOSIS — M5416 Radiculopathy, lumbar region: Secondary | ICD-10-CM

## 2024-11-25 DIAGNOSIS — M5430 Sciatica, unspecified side: Secondary | ICD-10-CM

## 2024-11-25 MED ORDER — OXYCODONE-ACETAMINOPHEN 10-325 MG PO TABS: 1.0000 | ORAL_TABLET | Freq: Three times a day (TID) | ORAL | 0 refills | Status: DC | PRN

## 2024-11-25 NOTE — Telephone Encounter (Signed)
 Patient is asking for refill on oxycodone  10-325 mg.  To be sent to [facility] [address]

## 2024-12-04 ENCOUNTER — Other Ambulatory Visit

## 2024-12-04 NOTE — Progress Notes (Unsigned)
 "  S:     Reason for visit: ?  Craig York is a 74 y.o. male with a history of diabetes (type 2), who presents today for a follow up diabetes Telephone pharmacotherapy visit.? Pertinent PMH also includes ASCVD, HTN, HLD,.  They were referred to the pharmacist by {referredtopharmacy:27270} for assistance in managing {referralreason:27271}.  Known DM Complications: {DM complications:33329}   Care Team: Primary Care Provider: Gasper Nancyann BRAVO, MD  At last visit, ***.   Patient reports Diabetes was diagnosed in ***.   Current diabetes medications include: Jardiance  25 mg daily, metformin  500 mg 2 tablets BID Previous diabetes medications include: *** Current hypertension medications include: *** Current hyperlipidemia medications include: ***  Patient reports adherence to taking all medications as prescribed.  *** Patient denies adherence with medications, reports missing *** medications *** times per week, on average.  Have you been experiencing any side effects to the medications prescribed? {YES NO:22349} Do you have any problems obtaining medications due to transportation or finances? {YES [id number] Insurance coverage: ***  Current medication access support: ***  Patient {Actions; denies-reports:120008} hypoglycemic events.  Reported home fasting blood sugars: ***  Reported 2 hour post-meal/random blood sugars: ***.  Patient {Actions; denies-reports:120008} nocturia (nighttime urination).  Patient {Actions; denies-reports:120008} neuropathy (nerve pain). Patient {Actions; denies-reports:120008} visual changes. Patient {Actions; denies-reports:120008} self foot exams.   Patient reported dietary habits: Eats *** meals/day Breakfast: *** Lunch: *** Dinner: *** Snacks: *** Drinks: ***  Patient-reported exercise habits: *** DM Prevention:  Statin: {Blank single:19197::***,Taking,Not taking,Intolerant to,Declines}; {Blank single:19197::low  intensity,moderate intensity,high intensity,n/a}.?  ACE/ARB: {Blank single:19197::yes,no}; *** History of chronic kidney disease? {Blank single:19197::yes,no} Last urinary albumin /creatinine ratio:  Lab Results  Component Value Date   MICRALBCREAT 5 09/26/2024   MICRALBCREAT 6 04/18/2023   MICRALBCREAT 14 12/21/2021   MICRALBCREAT n/a 11/06/2017   Last eye exam:  Lab Results  Component Value Date   HMDIABEYEEXA No Retinopathy 04/10/2023   Lab Results  Component Value Date   HMDIABEYEEXA No Retinopathy 04/10/2023   Last foot exam: No foot exam found Tobacco Use:  Tobacco Use: Medium Risk (09/26/2024)   Patient History    Smoking Tobacco Use: Former    Smokeless Tobacco Use: Former    Passive Exposure: Not on file   O:  {CGM reports:33570} 7 day average blood glucose: ***  Libre3 CGM Download today *** % Time CGM is active: ***% Average Glucose: *** mg/dL Glucose Management Indicator: ***  Glucose Variability: ***% (goal <36%) Time in Goal:  - Time in range 70-180: ***% - Time above range: ***% - Time below range: ***% Observed patterns:  Vitals:  Wt Readings from Last 3 Encounters:  09/26/24 182 lb (82.6 kg)  07/15/24 187 lb 6.3 oz (85 kg)  04/18/24 190 lb 4.8 oz (86.3 kg)   BP Readings from Last 3 Encounters:  09/26/24 130/70  07/15/24 137/76  04/18/24 136/67   Pulse Readings from Last 3 Encounters:  09/26/24 78  07/15/24 69  04/18/24 65     Labs:?  Lab Results  Component Value Date   HGBA1C 5.5 09/26/2024   HGBA1C 6.2 (H) 02/15/2024   HGBA1C 5.9 (A) 10/19/2023   GLUCOSE 194 (H) 07/14/2024   MICRALBCREAT 5 09/26/2024   MICRALBCREAT 6 04/18/2023   MICRALBCREAT 14 12/21/2021   CREATININE 1.01 07/14/2024   CREATININE 0.94 02/15/2024   CREATININE 0.83 06/11/2023    Lab Results  Component Value Date   CHOL 84 (L) 04/18/2023   LDLCALC 25 04/18/2023   LDLCALC  108 (H) 12/21/2021   LDLCALC 62 04/19/2021   HDL 41 04/18/2023   TRIG  90 04/18/2023   TRIG 119 12/21/2021   TRIG 158 (H) 04/19/2021   ALT 13 07/14/2024   ALT 13 02/15/2024   AST 20 07/14/2024   AST 22 02/15/2024      Chemistry      Component Value Date/Time   NA 140 07/14/2024 1022   NA 138 02/15/2024 1117   K 3.8 07/14/2024 1022   CL 107 07/14/2024 1022   CO2 22 07/14/2024 1022   BUN 12 07/14/2024 1022   BUN 15 02/15/2024 1117   CREATININE 1.01 07/14/2024 1022      Component Value Date/Time   CALCIUM  9.0 07/14/2024 1022   ALKPHOS 50 07/14/2024 1022   AST 20 07/14/2024 1022   ALT 13 07/14/2024 1022   BILITOT 0.7 07/14/2024 1022   BILITOT 0.5 02/15/2024 1117       The ASCVD Risk score (Arnett DK, et al., 2019) failed to calculate for the following reasons:   Risk score cannot be calculated because patient has a medical history suggesting prior/existing ASCVD   * - Cholesterol units were assumed  Lab Results  Component Value Date   MICRALBCREAT 5 09/26/2024   MICRALBCREAT 6 04/18/2023   MICRALBCREAT 14 12/21/2021   MICRALBCREAT n/a 11/06/2017    A/P: Diabetes currently *** with a most recent A1c of *** on ***, which is {DESC; UP/DOWN/UNCHANGED:18711} from *** on ***. Patient is *** able to verbalize appropriate hypoglycemia management plan. Medication adherence appears ***. Control is suboptimal due to ***. -{Meds adjust:18428} basal insulin  {basal insulins:33573}  *** units daily.  -{Meds adjust:18428} rapid insulin  {bolus insulin :33574} ***.  -{Meds adjust:18428} GLP-1 {GLP1 options:33572} *** mg .  -{Meds adjust:18428} SGLT2-I {SGLT2i options:33571}*** mg daily.  -{Meds adjust:18428} metformin  ***.  -Patient educated on purpose, proper use, and potential adverse effects of ***.  -Extensively discussed pathophysiology of diabetes, recommended lifestyle interventions, dietary effects on blood sugar control.  -Counseled on s/sx of and management of hypoglycemia.  -Next A1c anticipated ***.   ASCVD risk - {primary/secondary:33575}  prevention in patient with diabetes. Last LDL is *** mg/dL, not at goal of <29 *** mg/dL. ASCVD risk factors include *** and 10-year ASCVD risk score of ***. {Desc; low/moderate/high:110033} intensity statin indicated.  -{Meds adjust:18428} {statin therapies:33576} *** mg daily.  -{Meds adjust:18428} ezetimibe 10 mg daily   Hypertension longstanding *** currently ***. Blood pressure goal of <130/80 *** mmHg. Medication adherence ***. Blood pressure control is suboptimal due to ***. -{Meds adjust:18428} *** mg ***.  {pharmacisttime:33368}  Follow-up:  Pharmacist on *** PCP clinic visit on ***  Peyton CHARLENA Ferries, PharmD, CPP Clinical Pharmacist [facility] [phone]   "

## 2024-12-18 ENCOUNTER — Other Ambulatory Visit: Payer: Self-pay | Admitting: Family Medicine

## 2024-12-18 DIAGNOSIS — M5416 Radiculopathy, lumbar region: Secondary | ICD-10-CM

## 2024-12-18 DIAGNOSIS — M5430 Sciatica, unspecified side: Secondary | ICD-10-CM

## 2024-12-18 NOTE — Telephone Encounter (Unsigned)
 Copied from CRM #[id number]. Topic: Clinical - Medication Refill >> Dec 18, 2024  2:12 PM Antony RAMAN wrote: Medication: oxyCODONE -acetaminophen  (PERCOCET) 10-325 MG tablet  Has the patient contacted their pharmacy? Yes (Agent: If no, request that the patient contact the pharmacy for the refill. If patient does not wish to contact the pharmacy document the reason why and proceed with request.) (Agent: If yes, when and what did the pharmacy advise?)  This is the patient's preferred pharmacy:  [facility] [address] ([address]), [place] - [address] [place] ([place]) [place] [address] Phone: [phone] Fax: [phone]  [facility] [id number] [place], [place] - [address] [place] [address] Phone: [phone] Fax: [phone]  [facility] - [place], [place] - [address] [place] [address] Phone: [phone] Fax: [phone]  Is this the correct pharmacy for this prescription? Yes If no, delete pharmacy and type the correct one.   Has the prescription been filled recently? No  Is the patient out of the medication? No  Has the patient been seen for an appointment in the last year OR does the patient have an upcoming appointment? Yes  Can we respond through MyChart? Yes  Agent: Please be advised that Rx refills may take up to 3 business days. We ask that you follow-up with your pharmacy.

## 2024-12-19 ENCOUNTER — Telehealth: Payer: Self-pay

## 2024-12-19 MED ORDER — OXYCODONE-ACETAMINOPHEN 10-325 MG PO TABS: 1.0000 | ORAL_TABLET | Freq: Three times a day (TID) | ORAL | 0 refills | Status: AC | PRN

## 2024-12-19 NOTE — Telephone Encounter (Signed)
 Copied from CRM #[id number]. Topic: General - Other >> Dec 18, 2024  2:11 PM Antony RAMAN wrote: Reason for CRM: patient wants us  to fax the info to [employer] [facility] again so he can get jardiance  [phone]  [phone] >> Dec 19, 2024  7:41 AM Reena B wrote: Please advise. Will we need the documents re-sent to us ?

## 2024-12-19 NOTE — Telephone Encounter (Signed)
 Requested medications are due for refill today.  yes  Requested medications are on the active medications list.  yes  Last refill. 11/25/2024 #60 0 rf  Future visit scheduled.   yes  Notes to clinic.  Refill not delegated.     Requested Prescriptions  Pending Prescriptions Disp Refills   oxyCODONE -acetaminophen  (PERCOCET) 10-325 MG tablet 60 tablet 0    Sig: Take 1 tablet by mouth every 8 (eight) hours as needed for pain.     Not Delegated - Analgesics:  Opioid Agonist Combinations Failed - 12/19/2024 10:18 AM      Failed - This refill cannot be delegated      Failed - Urine Drug Screen completed in last 360 days      Passed - Valid encounter within last 3 months    Recent Outpatient Visits           2 months ago Iron deficiency anemia, unspecified iron deficiency anemia type   [facility] Gasper Nancyann BRAVO, MD   3 months ago Upper GI bleed   [facility] Gasper Nancyann BRAVO, MD   8 months ago Acute midline low back pain without sciatica   [facility] Gasper Nancyann BRAVO, MD   10 months ago Prostate cancer screening   [facility] Gasper Nancyann BRAVO, MD

## 2024-12-30 NOTE — Telephone Encounter (Signed)
 Gave [employer] to follow up on pt application Jardiance ,spoke with [employer] representative explain they are missing pg#4 of priver portion will faxed to provider office for then to sign and date.

## 2024-12-31 NOTE — Telephone Encounter (Signed)
 Re-completed and faxed over provider portion for Jardiance  [employer] application to the company.  Tresa Jolley E. Marsh, PharmD, BCACP, CPP Clinical Pharmacist [facility] [phone]

## 2025-03-13 ENCOUNTER — Ambulatory Visit: Admitting: Family Medicine

## 2025-04-15 ENCOUNTER — Ambulatory Visit
